# Patient Record
Sex: Female | Born: 1935 | Race: White | Hispanic: No | Smoking: Former smoker
Health system: Southern US, Community
[De-identification: ages and names within clinical notes are randomized; demographics above are authoritative.]

## PROBLEM LIST (undated history)

## (undated) DIAGNOSIS — M21611 Bunion of right foot: Secondary | ICD-10-CM

## (undated) DIAGNOSIS — M170 Bilateral primary osteoarthritis of knee: Secondary | ICD-10-CM

## (undated) DIAGNOSIS — M8580 Other specified disorders of bone density and structure, unspecified site: Secondary | ICD-10-CM

## (undated) DIAGNOSIS — R42 Dizziness and giddiness: Secondary | ICD-10-CM

## (undated) DIAGNOSIS — E785 Hyperlipidemia, unspecified: Secondary | ICD-10-CM

## (undated) DIAGNOSIS — D039 Melanoma in situ, unspecified: Secondary | ICD-10-CM

## (undated) DIAGNOSIS — I2510 Atherosclerotic heart disease of native coronary artery without angina pectoris: Secondary | ICD-10-CM

## (undated) DIAGNOSIS — C44310 Basal cell carcinoma of skin of unspecified parts of face: Secondary | ICD-10-CM

## (undated) DIAGNOSIS — F419 Anxiety disorder, unspecified: Secondary | ICD-10-CM

## (undated) DIAGNOSIS — Z8639 Personal history of other endocrine, nutritional and metabolic disease: Secondary | ICD-10-CM

## (undated) DIAGNOSIS — G629 Polyneuropathy, unspecified: Principal | ICD-10-CM

## (undated) DIAGNOSIS — I10 Essential (primary) hypertension: Secondary | ICD-10-CM

## (undated) DIAGNOSIS — T7840XA Allergy, unspecified, initial encounter: Secondary | ICD-10-CM

## (undated) DIAGNOSIS — M5416 Radiculopathy, lumbar region: Secondary | ICD-10-CM

## (undated) DIAGNOSIS — I872 Venous insufficiency (chronic) (peripheral): Secondary | ICD-10-CM

## (undated) DIAGNOSIS — M21612 Bunion of left foot: Secondary | ICD-10-CM

## (undated) DIAGNOSIS — C44621 Squamous cell carcinoma of skin of unspecified upper limb, including shoulder: Secondary | ICD-10-CM

## (undated) DIAGNOSIS — I5030 Unspecified diastolic (congestive) heart failure: Secondary | ICD-10-CM

## (undated) DIAGNOSIS — N9089 Other specified noninflammatory disorders of vulva and perineum: Secondary | ICD-10-CM

## (undated) DIAGNOSIS — K219 Gastro-esophageal reflux disease without esophagitis: Secondary | ICD-10-CM

## (undated) HISTORY — DX: Unspecified diastolic (congestive) heart failure: I50.30

## (undated) HISTORY — DX: Dizziness and giddiness: R42

## (undated) HISTORY — DX: Radiculopathy, lumbar region: M54.16

## (undated) HISTORY — PX: EYE SURGERY: SHX253

## (undated) HISTORY — DX: Basal cell carcinoma of skin of unspecified parts of face: C44.310

## (undated) HISTORY — DX: Essential (primary) hypertension: I10

## (undated) HISTORY — DX: Anxiety disorder, unspecified: F41.9

## (undated) HISTORY — DX: Bilateral primary osteoarthritis of knee: M17.0

## (undated) HISTORY — DX: Melanoma in situ, unspecified: D03.9

## (undated) HISTORY — DX: Venous insufficiency (chronic) (peripheral): I87.2

## (undated) HISTORY — DX: Hyperlipidemia, unspecified: E78.5

## (undated) HISTORY — DX: Bunion of left foot: M21.612

## (undated) HISTORY — DX: Bunion of right foot: M21.611

## (undated) HISTORY — DX: Personal history of other endocrine, nutritional and metabolic disease: Z86.39

## (undated) HISTORY — DX: Atherosclerotic heart disease of native coronary artery without angina pectoris: I25.10

## (undated) HISTORY — DX: Other specified noninflammatory disorders of vulva and perineum: N90.89

## (undated) HISTORY — DX: Squamous cell carcinoma of skin of unspecified upper limb, including shoulder: C44.621

## (undated) HISTORY — DX: Allergy, unspecified, initial encounter: T78.40XA

## (undated) HISTORY — DX: Polyneuropathy, unspecified: G62.9

## (undated) HISTORY — PX: TUBAL LIGATION: SHX77

## (undated) HISTORY — DX: Other specified disorders of bone density and structure, unspecified site: M85.80

## (undated) HISTORY — DX: Gastro-esophageal reflux disease without esophagitis: K21.9

---

## 1997-06-21 ENCOUNTER — Other Ambulatory Visit: Admission: RE | Admit: 1997-06-21 | Discharge: 1997-06-21 | Payer: Self-pay | Admitting: Family Medicine

## 1997-07-01 ENCOUNTER — Ambulatory Visit (HOSPITAL_COMMUNITY): Admission: RE | Admit: 1997-07-01 | Discharge: 1997-07-01 | Payer: Self-pay | Admitting: Family Medicine

## 1997-12-24 ENCOUNTER — Encounter: Admission: RE | Admit: 1997-12-24 | Discharge: 1997-12-24 | Payer: Self-pay | Admitting: *Deleted

## 1998-05-16 ENCOUNTER — Other Ambulatory Visit: Admission: RE | Admit: 1998-05-16 | Discharge: 1998-05-16 | Payer: Self-pay | Admitting: Family Medicine

## 1998-07-04 ENCOUNTER — Ambulatory Visit (HOSPITAL_COMMUNITY): Admission: RE | Admit: 1998-07-04 | Discharge: 1998-07-04 | Payer: Self-pay | Admitting: Family Medicine

## 1999-05-18 ENCOUNTER — Other Ambulatory Visit: Admission: RE | Admit: 1999-05-18 | Discharge: 1999-05-18 | Payer: Self-pay | Admitting: Family Medicine

## 1999-07-05 ENCOUNTER — Ambulatory Visit (HOSPITAL_COMMUNITY): Admission: RE | Admit: 1999-07-05 | Discharge: 1999-07-05 | Payer: Self-pay | Admitting: Obstetrics & Gynecology

## 2000-05-21 ENCOUNTER — Other Ambulatory Visit: Admission: RE | Admit: 2000-05-21 | Discharge: 2000-05-21 | Payer: Self-pay | Admitting: Family Medicine

## 2000-06-11 ENCOUNTER — Encounter: Admission: RE | Admit: 2000-06-11 | Discharge: 2000-06-11 | Payer: Self-pay | Admitting: Internal Medicine

## 2000-06-11 ENCOUNTER — Encounter: Payer: Self-pay | Admitting: Family Medicine

## 2000-07-08 ENCOUNTER — Ambulatory Visit (HOSPITAL_COMMUNITY): Admission: RE | Admit: 2000-07-08 | Discharge: 2000-07-08 | Payer: Self-pay

## 2001-07-10 ENCOUNTER — Encounter: Payer: Self-pay | Admitting: Family Medicine

## 2001-07-10 ENCOUNTER — Ambulatory Visit (HOSPITAL_COMMUNITY): Admission: RE | Admit: 2001-07-10 | Discharge: 2001-07-10 | Payer: Self-pay | Admitting: Family Medicine

## 2001-07-23 ENCOUNTER — Other Ambulatory Visit: Admission: RE | Admit: 2001-07-23 | Discharge: 2001-07-23 | Payer: Self-pay | Admitting: Family Medicine

## 2002-07-15 ENCOUNTER — Ambulatory Visit (HOSPITAL_COMMUNITY): Admission: RE | Admit: 2002-07-15 | Discharge: 2002-07-15 | Payer: Self-pay | Admitting: *Deleted

## 2002-07-24 ENCOUNTER — Other Ambulatory Visit: Admission: RE | Admit: 2002-07-24 | Discharge: 2002-07-24 | Payer: Self-pay | Admitting: Family Medicine

## 2004-05-15 ENCOUNTER — Ambulatory Visit (HOSPITAL_COMMUNITY): Admission: RE | Admit: 2004-05-15 | Discharge: 2004-05-15 | Payer: Self-pay | Admitting: *Deleted

## 2004-05-29 ENCOUNTER — Ambulatory Visit (HOSPITAL_COMMUNITY): Admission: RE | Admit: 2004-05-29 | Discharge: 2004-05-29 | Payer: Self-pay | Admitting: *Deleted

## 2005-03-30 ENCOUNTER — Other Ambulatory Visit: Admission: RE | Admit: 2005-03-30 | Discharge: 2005-03-30 | Payer: Self-pay | Admitting: Family Medicine

## 2005-05-16 ENCOUNTER — Ambulatory Visit (HOSPITAL_COMMUNITY): Admission: RE | Admit: 2005-05-16 | Discharge: 2005-05-16 | Payer: Self-pay | Admitting: Family Medicine

## 2006-05-17 ENCOUNTER — Ambulatory Visit (HOSPITAL_COMMUNITY): Admission: RE | Admit: 2006-05-17 | Discharge: 2006-05-17 | Payer: Self-pay | Admitting: Family Medicine

## 2006-11-22 ENCOUNTER — Encounter: Admission: RE | Admit: 2006-11-22 | Discharge: 2006-11-22 | Payer: Self-pay | Admitting: Family Medicine

## 2008-08-12 ENCOUNTER — Other Ambulatory Visit: Admission: RE | Admit: 2008-08-12 | Discharge: 2008-08-12 | Payer: Self-pay | Admitting: Family Medicine

## 2009-03-19 DIAGNOSIS — I2510 Atherosclerotic heart disease of native coronary artery without angina pectoris: Secondary | ICD-10-CM

## 2009-03-19 HISTORY — DX: Atherosclerotic heart disease of native coronary artery without angina pectoris: I25.10

## 2009-09-02 ENCOUNTER — Ambulatory Visit: Payer: Self-pay | Admitting: Internal Medicine

## 2009-09-02 ENCOUNTER — Observation Stay (HOSPITAL_COMMUNITY): Admission: EM | Admit: 2009-09-02 | Discharge: 2009-09-03 | Payer: Self-pay | Admitting: Emergency Medicine

## 2009-09-15 ENCOUNTER — Ambulatory Visit: Payer: Self-pay | Admitting: Otolaryngology

## 2009-10-13 ENCOUNTER — Ambulatory Visit: Payer: Self-pay | Admitting: Otolaryngology

## 2009-10-13 LAB — CBC AND DIFFERENTIAL: WBC: 5.3 10^3/mL

## 2009-11-25 ENCOUNTER — Ambulatory Visit: Payer: Self-pay | Admitting: Cardiology

## 2009-12-01 ENCOUNTER — Telehealth (INDEPENDENT_AMBULATORY_CARE_PROVIDER_SITE_OTHER): Payer: Self-pay | Admitting: *Deleted

## 2009-12-05 ENCOUNTER — Encounter: Payer: Self-pay | Admitting: Internal Medicine

## 2009-12-05 ENCOUNTER — Encounter (HOSPITAL_COMMUNITY): Admission: RE | Admit: 2009-12-05 | Discharge: 2010-02-17 | Payer: Self-pay | Admitting: Cardiology

## 2009-12-05 ENCOUNTER — Ambulatory Visit (HOSPITAL_COMMUNITY): Admission: RE | Admit: 2009-12-05 | Discharge: 2009-12-05 | Payer: Self-pay | Admitting: Cardiology

## 2009-12-05 ENCOUNTER — Ambulatory Visit: Payer: Self-pay

## 2009-12-05 ENCOUNTER — Ambulatory Visit: Payer: Self-pay | Admitting: Internal Medicine

## 2009-12-05 HISTORY — PX: TRANSTHORACIC ECHOCARDIOGRAM: SHX275

## 2009-12-14 ENCOUNTER — Ambulatory Visit: Payer: Self-pay | Admitting: Cardiovascular Disease

## 2009-12-19 ENCOUNTER — Ambulatory Visit: Payer: Self-pay | Admitting: Cardiovascular Disease

## 2009-12-19 ENCOUNTER — Encounter: Payer: Self-pay | Admitting: Thoracic Surgery (Cardiothoracic Vascular Surgery)

## 2009-12-19 ENCOUNTER — Inpatient Hospital Stay (HOSPITAL_BASED_OUTPATIENT_CLINIC_OR_DEPARTMENT_OTHER): Admission: RE | Admit: 2009-12-19 | Discharge: 2009-12-19 | Payer: Self-pay | Admitting: Cardiovascular Disease

## 2009-12-19 ENCOUNTER — Inpatient Hospital Stay (HOSPITAL_COMMUNITY): Admission: EM | Admit: 2009-12-19 | Discharge: 2009-12-21 | Payer: Self-pay | Admitting: Cardiovascular Disease

## 2009-12-19 ENCOUNTER — Ambulatory Visit: Payer: Self-pay | Admitting: Thoracic Surgery (Cardiothoracic Vascular Surgery)

## 2009-12-20 HISTORY — PX: CARDIAC CATHETERIZATION: SHX172

## 2010-01-04 ENCOUNTER — Ambulatory Visit: Payer: Self-pay | Admitting: Cardiology

## 2010-02-08 ENCOUNTER — Ambulatory Visit: Payer: Self-pay | Admitting: Cardiology

## 2010-04-06 ENCOUNTER — Encounter: Payer: Self-pay | Admitting: Cardiology

## 2010-04-18 NOTE — Progress Notes (Signed)
Summary: Nuclear Pre-Procedure  Phone Note Outgoing Call   Call placed by: Milana Na, EMT-P,  December 01, 2009 12:38 PM Summary of Call: Reviewed information on [employer] Information Sheet (see scanned document for further details).  Spoke with patient.     Nuclear Med Background Indications for Stress Test: Evaluation for Ischemia  Indications Comments: 06/17/11dizziness  ED   History Comments: Cardiomegaly per CXR  Symptoms: DOE, Light-Headedness, Near Syncope, Palpitations, Rapid HR, SOB    Nuclear Pre-Procedure Cardiac Risk Factors: History of Smoking, Hypertension  Nuclear Med Study Referring MD:  T.Brackbill

## 2010-04-18 NOTE — Assessment & Plan Note (Signed)
Summary: Cardiology Nuclear Testing  Nuclear Med Background Indications for Stress Test: Evaluation for Ischemia  Indications Comments: 09/02/09 ED: Dizziness with HTN    History Comments: Cardiomegaly per CXR  Symptoms: Diaphoresis, Dizziness, DOE, Fatigue with Exertion, Light-Headedness, Near Syncope, Palpitations, Rapid HR, SOB  Symptoms Comments: Indigestion Yesterday.   Nuclear Pre-Procedure Cardiac Risk Factors: History of Smoking, Hypertension, Lipids Caffeine/Decaff Intake: none NPO After: 7:30 AM Lungs: Clear IV 0.9% NS with Angio Cath: 22g     IV Site: R Hand IV Started by: Cathlyn Parsons, RN Chest Size (in) 34     Cup Size B     Height (in): 63 Weight (lb): 140 BMI: 24.89  Nuclear Med Study 1 or 2 day study:  1 day     Stress Test Type:  Stress Reading MD:  Dietrich Pates, MD     Referring MD:  T.Brackbill Resting Radionuclide:  Technetium 81m Tetrofosmin     Resting Radionuclide Dose:  11.0 mCi  Stress Radionuclide:  Technetium 72m Tetrofosmin     Stress Radionuclide Dose:  33.0 mCi   Stress Protocol Exercise Time (min):  4:00 min     Max HR:  136 bpm     Predicted Max HR:  135 bpm  Max Systolic BP: 187 mm Hg     Percent Max HR:  100.74 %     METS: 5.5 Rate Pressure Product:  16109    Stress Test Technologist:  Irean Hong,  RN     Nuclear Technologist:  Doyne Keel, CNMT  Rest Procedure  Myocardial perfusion imaging was performed at rest 45 minutes following the intravenous administration of Technetium 52m Tetrofosmin.  Stress Procedure  The patient exercised for four minutes.  The patient stopped due to DOE   and denied any chest pain.  There were  significant ST-T wave changes, frequent PVC's,rare couplet,begiminy,PJC,PAC.  Technetium 16m Tetrofosmin was injected at peak exercise and myocardial perfusion imaging was performed after a brief delay.  QPS Raw Data Images:  Images were motion corrected.  Soft tissue (diaphragm, breast, bowel activity)  surround heart. Stress Images:  Mild thinning in the anterior wall (mid, distal) and apex.  Otherwise normal perfusion. Rest Images:  Normal homogeneous uptake in all areas of the myocardium. Subtraction (SDS):  Signifcant for ischemia. Transient Ischemic Dilatation:  0.97  (Normal <1.22)  Lung/Heart Ratio:  0.26  (Normal <0.45)  Quantitative Gated Spect Images QGS EDV:  78 ml QGS ESV:  28 ml QGS EF:  64 % QGS cine images:  Normal wall thickening.   Overall Impression  Exercise Capacity: Fair exercise capacity. BP Response: Normal blood pressure response. Clinical Symptoms: No chest pain ECG Impression: 1to 1.5  mm uplsloping ST depression in the inferolateral leads during exercise; less than 1 mm flat ST deprssion in the inferolateral leads in recovery. Overall Impression: Mild ischemia in the anterior and apical wall.  Otherwise normal perfusion.  Cannot completely exclude shifting soft tissue (breast)

## 2010-06-01 LAB — CBC
HCT: 36.3 % (ref 36.0–46.0)
Hemoglobin: 12.3 g/dL (ref 12.0–15.0)
Hemoglobin: 14.4 g/dL (ref 12.0–15.0)
MCHC: 33.9 g/dL (ref 30.0–36.0)
Platelets: 164 10*3/uL (ref 150–400)
RBC: 3.89 MIL/uL (ref 3.87–5.11)
RBC: 4.54 MIL/uL (ref 3.87–5.11)
RDW: 13.4 % (ref 11.5–15.5)
WBC: 6.6 10*3/uL (ref 4.0–10.5)

## 2010-06-01 LAB — BASIC METABOLIC PANEL
BUN: 12 mg/dL (ref 6–23)
CO2: 26 mEq/L (ref 19–32)
CO2: 28 mEq/L (ref 19–32)
Calcium: 8.9 mg/dL (ref 8.4–10.5)
Creatinine, Ser: 0.67 mg/dL (ref 0.4–1.2)
GFR calc non Af Amer: >60 mL/min (ref >60)
Glucose, Bld: 105 mg/dL — ABNORMAL HIGH (ref 70–99)

## 2010-06-01 LAB — APTT: aPTT: 24 seconds (ref 24–37)

## 2010-06-01 LAB — CARDIAC PANEL(CRET KIN+CKTOT+MB+TROPI)
CK, MB: 1.7 ng/mL (ref 0.3–4.0)
CK, MB: 1.8 ng/mL (ref 0.3–4.0)
Total CK: 65 U/L (ref 7–177)
Total CK: 67 U/L (ref 7–177)
Troponin I: 0.02 ng/mL (ref 0.00–0.06)

## 2010-06-01 LAB — PROTIME-INR
INR: 0.92 (ref 0.00–1.49)
Prothrombin Time: 12.6 seconds (ref 11.6–15.2)

## 2010-06-04 LAB — CARDIAC PANEL(CRET KIN+CKTOT+MB+TROPI)
CK, MB: 2.3 ng/mL (ref 0.3–4.0)
Relative Index: INVALID (ref 0.0–2.5)
Total CK: 69 U/L (ref 7–177)
Troponin I: 0.02 ng/mL (ref 0.00–0.06)

## 2010-06-04 LAB — BASIC METABOLIC PANEL
BUN: 15 mg/dL (ref 6–23)
CO2: 25 mEq/L (ref 19–32)
Calcium: 9.3 mg/dL (ref 8.4–10.5)
Chloride: 99 mEq/L (ref 96–112)
Creatinine, Ser: 0.8 mg/dL (ref 0.4–1.2)
GFR calc Af Amer: >60 mL/min (ref >60)
GFR calc non Af Amer: >60 mL/min (ref >60)
Glucose, Bld: 120 mg/dL — ABNORMAL HIGH (ref 70–99)
Potassium: 4.1 mEq/L (ref 3.5–5.1)
Sodium: 134 mEq/L — ABNORMAL LOW (ref 135–145)

## 2010-06-04 LAB — CBC
HCT: 44 % (ref 36.0–46.0)
Hemoglobin: 15.1 g/dL — ABNORMAL HIGH (ref 12.0–15.0)
MCHC: 34.4 g/dL (ref 30.0–36.0)
MCV: 94.6 fL (ref 78.0–100.0)
Platelets: 161 10*3/uL (ref 150–400)
RBC: 4.65 MIL/uL (ref 3.87–5.11)
RDW: 14.2 % (ref 11.5–15.5)
WBC: 7.2 10*3/uL (ref 4.0–10.5)

## 2010-06-04 LAB — URINALYSIS, ROUTINE W REFLEX MICROSCOPIC
Bilirubin Urine: NEGATIVE
Glucose, UA: NEGATIVE mg/dL
Hgb urine dipstick: NEGATIVE
Ketones, ur: NEGATIVE mg/dL
Nitrite: NEGATIVE
Protein, ur: NEGATIVE mg/dL
Specific Gravity, Urine: 1.013 (ref 1.005–1.030)
Urobilinogen, UA: 0.2 mg/dL (ref 0.0–1.0)
pH: 7.5 (ref 5.0–8.0)

## 2010-06-04 LAB — TROPONIN I: Troponin I: 0.02 ng/mL (ref 0.00–0.06)

## 2010-06-04 LAB — LIPID PANEL
HDL: 72 mg/dL (ref >39)
LDL Cholesterol: 132 mg/dL — ABNORMAL HIGH (ref 0–99)
Total CHOL/HDL Ratio: 3 RATIO
Triglycerides: 59 mg/dL (ref <150)

## 2010-06-04 LAB — CK TOTAL AND CKMB (NOT AT ARMC)
CK, MB: 2.9 ng/mL (ref 0.3–4.0)
Relative Index: INVALID (ref 0.0–2.5)
Total CK: 81 U/L (ref 7–177)

## 2010-06-04 LAB — APTT: aPTT: 34 seconds (ref 24–37)

## 2010-06-04 LAB — DIFFERENTIAL
Basophils Absolute: 0 10*3/uL (ref 0.0–0.1)
Basophils Relative: 0 % (ref 0–1)
Eosinophils Absolute: 0 10*3/uL (ref 0.0–0.7)
Eosinophils Relative: 0 % (ref 0–5)
Lymphocytes Relative: 13 % (ref 12–46)
Lymphs Abs: 1 10*3/uL (ref 0.7–4.0)
Monocytes Absolute: 0.5 10*3/uL (ref 0.1–1.0)
Monocytes Relative: 7 % (ref 3–12)
Neutro Abs: 5.6 10*3/uL (ref 1.7–7.7)
Neutrophils Relative %: 79 % — ABNORMAL HIGH (ref 43–77)

## 2010-06-04 LAB — PROTIME-INR
INR: 0.97 (ref 0.00–1.49)
Prothrombin Time: 12.8 seconds (ref 11.6–15.2)

## 2010-06-06 ENCOUNTER — Encounter: Payer: Self-pay | Admitting: Cardiology

## 2010-06-06 ENCOUNTER — Other Ambulatory Visit: Payer: Self-pay | Admitting: Cardiology

## 2010-06-06 ENCOUNTER — Ambulatory Visit (INDEPENDENT_AMBULATORY_CARE_PROVIDER_SITE_OTHER): Payer: Medicare Other | Admitting: Cardiology

## 2010-06-06 VITALS — BP 150/80 | HR 68 | Wt 139.0 lb

## 2010-06-06 DIAGNOSIS — I491 Atrial premature depolarization: Secondary | ICD-10-CM

## 2010-06-06 DIAGNOSIS — I259 Chronic ischemic heart disease, unspecified: Secondary | ICD-10-CM

## 2010-06-06 DIAGNOSIS — Z79899 Other long term (current) drug therapy: Secondary | ICD-10-CM

## 2010-06-06 DIAGNOSIS — E7800 Pure hypercholesterolemia, unspecified: Secondary | ICD-10-CM

## 2010-06-06 LAB — COMPREHENSIVE METABOLIC PANEL
ALT: 14 U/L (ref 0–35)
AST: 13 U/L (ref 0–37)
Albumin: 4.8 g/dL (ref 3.5–5.2)
CO2: 24 mEq/L (ref 19–32)
Calcium: 9.7 mg/dL (ref 8.4–10.5)
Chloride: 98 mEq/L (ref 96–112)
Creat: 0.78 mg/dL (ref 0.40–1.20)
Potassium: 4.3 mEq/L (ref 3.5–5.3)
Sodium: 136 mEq/L (ref 135–145)
Total Protein: 7.5 g/dL (ref 6.0–8.3)

## 2010-06-06 LAB — LIPID PANEL: Total CHOL/HDL Ratio: 2.8 Ratio

## 2010-06-06 NOTE — Progress Notes (Addendum)
Subjective: This 77 year old Caucasian female is seen for a scheduled 4 month followup office visit and EKG.  She has known coronary artery disease.  She had 3 bare metal stents inserted on 12/20/09 into her mid left circumflex coronary artery second obtuse marginal vessel, into the first obtuse marginal vessel, and into the mid right coronary artery.  She's had no subsequent angina pectoris and she is now experiencing any exertional dyspnea.  She has finished her 12 week cardiac rehabilitation program and is now participating in a "fit and strong" exercise program at the [place]  recreation center 3 days a week.She is not having any side effects from her long-term statin therapy.  She does have a past history of whitecoat syndrome and her blood pressures at home are normal in the range of the 130/60.  Current Outpatient Prescriptions  Medication Sig Dispense Refill  . aspirin 325 MG tablet Take 325 mg by mouth daily.        . calcium carbonate (OS-CAL) 600 MG TABS Take 600 mg by mouth daily. Takes occ      . co-enzyme Q-10 30 MG capsule Take 30 mg by mouth daily.       . enalapril (VASOTEC) 20 MG tablet Take 20 mg by mouth daily.       . metoprolol (TOPROL-XL) 25 MG 24 hr tablet Take 25 mg by mouth daily. Takes 1/2 tab daily      . Multiple Vitamin (MULTIVITAMIN) tablet Take 1 tablet by mouth daily. Takes occ      . Omega-3 Fatty Acids (FISH OIL PO) Take by mouth daily.        . simvastatin (ZOCOR) 20 MG tablet Take 20 mg by mouth daily.       . Triamterene-HCTZ (MAXZIDE PO) Take by mouth as needed.        . ALPRAZolam (XANAX) 0.25 MG tablet Take 0.25 mg by mouth at bedtime as needed.       . clopidogrel (PLAVIX) 75 MG tablet Take 75 mg by mouth daily.       . famotidine (PEPCID) 20 MG tablet Take 20 mg by mouth daily.       Marland Kitchen guaiFENesin (MUCINEX) 600 MG 12 hr tablet Take 600 mg by mouth 2 (two) times daily. As needed      . DISCONTD: aspirin 81 MG tablet Take 325 mg by mouth daily.          Allergies  Allergen Reactions  . Atenolol   . Gabapentin     Patient Active Problem List  Diagnoses  . Hypertension  . Anxiety  . Vertigo    History  Smoking status  . Former Smoker  . Quit date: 09/04/1977  Smokeless tobacco  . Not on file    History  Alcohol Use     Family History  Problem Relation Age of Onset  . Heart disease Mother   . Stroke Father     Review of Systems: Constitutional: no fever chills diaphoresis or fatigue or change in weight.  Head and neck: no hearing loss, no epistaxis, no photophobia or visual disturbance. Respiratory: No cough, shortness of breath or wheezing. Cardiovascular: No chest pain peripheral edema, palpitations. Gastrointestinal: No abdominal distention, no abdominal pain, no change in bowel habits hematochezia or melena. Genitourinary: No dysuria, no frequency, no urgency, no nocturia. Musculoskeletal:No arthralgias, no back pain, no gait disturbance or myalgias. Neurological: No dizziness, no headaches, no numbness, no seizures, no syncope, no weakness, no tremors. Hematologic: No lymphadenopathy,  no easy bruising. Psychiatric: No confusion, no hallucinations, no sleep disturbance.  Physical Exam: Blood pressure is 150/80 bilaterally sitting.  Weight is 139.  Pulse is 68 and regular.  This is an alert 77 year old woman in no acute distress.Pupils equal and reactive.   Extraocular Movements are full.  There is no scleral icterus.  The mouth and pharynx are normal.  The neck is supple.  The carotids reveal no bruits.  The jugular venous pressure is normal.  The thyroid is not enlarged.  There is no lymphadenopathy.The chest is clear to percussion and auscultation. There are no rales or rhonchi. Expansion of the chest is symmetrical.The precordium is quiet.  The first heart sound is normal.  The second heart sound is physiologically split.  There is no murmur gallop rub or click.  There is no abnormal lift or heave.The abdomen is  soft and nontender. Bowel sounds are normal. The liver and spleen are not enlarged. There Are no abdominal masses. There are no bruits.The pedal pulses are good.  There is no phlebitis or edema.  There is no cyanosis or clubbing.Strength is normal and symmetrical in all extremities.  There is no lateralizing weakness.  There are no sensory deficits.  Assessment / Plan Stable ischemic heart disease post drug-eluting stents x3 The patient will remain on her same medication and be rechecked in 6 months for followup office visit

## 2010-06-19 ENCOUNTER — Telehealth: Payer: Self-pay | Admitting: *Deleted

## 2010-06-19 NOTE — Telephone Encounter (Signed)
Message copied by Regis Bill on Mon Jun 19, 2010  4:30 PM ------      Message from: Cassell Clement      Created: Wed Jun 07, 2010  8:57 AM       Please report to patient.  Her comprehensive metabolic panel is normal.  Her liver function studies are normal and your kidney function is normal.  Her lipids have improved since last time.Please send copies of labs to Silas [place] NP

## 2010-06-19 NOTE — Telephone Encounter (Signed)
Advised patient of labs 

## 2010-06-21 ENCOUNTER — Encounter: Payer: Self-pay | Admitting: *Deleted

## 2010-07-31 ENCOUNTER — Telehealth: Payer: Self-pay | Admitting: Cardiology

## 2010-07-31 NOTE — Telephone Encounter (Signed)
Agree with advice given

## 2010-07-31 NOTE — Telephone Encounter (Signed)
OVER NIGHT HAD SEVERE INDIGUESTION, BURPERING, PAIN IN RIGHT CHEST. CAN'T EAT DUE, BURPING AND WEAK STOMACH. WHAT SHOULD SHE DO?  HAS NOT TAKING ANYTHING ELSE OTHER THAN BP MED.

## 2010-07-31 NOTE — Telephone Encounter (Signed)
Patient went to her primary care doctor and they couldn't see her today.  Pain is right sided, denies any shortness of breath, or radiating anywhere.  Has not taken any pepcid or anything like it.  Does have appointment with primary doctor tomorrow.  Advised to go to the emergency room if pain starts to radiate or shortness of breath.  Verbalized understanding.

## 2010-09-26 ENCOUNTER — Other Ambulatory Visit: Payer: Self-pay | Admitting: Cardiology

## 2010-09-26 DIAGNOSIS — F419 Anxiety disorder, unspecified: Secondary | ICD-10-CM

## 2010-09-26 DIAGNOSIS — I10 Essential (primary) hypertension: Secondary | ICD-10-CM

## 2010-09-26 NOTE — Telephone Encounter (Signed)
Called in to report her episodes of high blood pressure and she called 911 this morning and the EMT's recorded her blood pressure at 190/100 and they suggested that she call her cardiologist and suggested that her medications be increased. Also needs a refill for Xanax and needs to schedule an appointment from her recall. Please call back. I have pulled her chart.

## 2010-09-26 NOTE — Telephone Encounter (Signed)
Would continue present dose of blood pressure meds and keep a written record checking it morning and evening and let us know in about a week how she is doing by phone

## 2010-09-26 NOTE — Telephone Encounter (Signed)
Advised patient

## 2010-09-26 NOTE — Telephone Encounter (Signed)
blood pressure on the 4th went way up and was real dizzy.  This am 197/98 heart rate 91.  Since surgery not handling blood pressure very well.  Has not used xanax and they suggested she use that.  She did take the xanax.  This blood pressure reading was before her morning medications.  When rechecked blood pressure 138/79 around 9:30  Am.  The elevated blood pressure was around 6 am.

## 2010-09-26 NOTE — Telephone Encounter (Signed)
Patient takes vasotec 20 mg daily and toprol 25 mg 1/2 daily.  Doesn't check blood pressure on regular basis, however average 120's-130's/80's.  Please advise

## 2010-09-26 NOTE — Telephone Encounter (Signed)
Ok to refill xanax 0.25 mg #30, you rx'd in October.  Please advise

## 2010-09-27 NOTE — Telephone Encounter (Signed)
Patient phoned again today c/o elevated blood pressure.  Took am medications around 5:00 am and at 9:00 am blood pressure 154/85, 1:00 pm 179/100 and now 196/91 and pulse 81.  Discussed with  Dr. Patty Sermons and will increased vasotec 20 mg to twice daily and metoprolol to 1 full tablet daily, has been on vasotec 20 mg daily and metoprolol 25 mg 1/2 daily.  Take the other metoprolol 1/2 tablet now.  Continue to monitor blood pressure and let us know how it is doing.  Ok to use xanax as needed.

## 2010-09-27 NOTE — Telephone Encounter (Signed)
Okay to refill Xanax? 

## 2010-09-28 ENCOUNTER — Encounter: Payer: Self-pay | Admitting: Cardiology

## 2010-09-28 MED ORDER — ALPRAZOLAM 0.25 MG PO TABS: 0.2500 mg | ORAL_TABLET | Freq: Every evening | ORAL | Status: DC | PRN

## 2010-09-28 MED ORDER — METOPROLOL SUCCINATE ER 25 MG PO TB24: ORAL_TABLET | ORAL | Status: DC

## 2010-09-28 MED ORDER — ENALAPRIL MALEATE 20 MG PO TABS: 20.0000 mg | ORAL_TABLET | Freq: Two times a day (BID) | ORAL | Status: DC

## 2010-10-31 ENCOUNTER — Encounter (HOSPITAL_BASED_OUTPATIENT_CLINIC_OR_DEPARTMENT_OTHER): Payer: Self-pay | Admitting: *Deleted

## 2010-10-31 ENCOUNTER — Emergency Department (INDEPENDENT_AMBULATORY_CARE_PROVIDER_SITE_OTHER): Payer: Medicare Other

## 2010-10-31 ENCOUNTER — Emergency Department (HOSPITAL_BASED_OUTPATIENT_CLINIC_OR_DEPARTMENT_OTHER)
Admission: EM | Admit: 2010-10-31 | Discharge: 2010-10-31 | Disposition: A | Discharge status: Home / Self Care | Payer: Medicare Other | Attending: Emergency Medicine | Admitting: Emergency Medicine

## 2010-10-31 DIAGNOSIS — M79606 Pain in leg, unspecified: Secondary | ICD-10-CM

## 2010-10-31 DIAGNOSIS — I10 Essential (primary) hypertension: Secondary | ICD-10-CM | POA: Insufficient documentation

## 2010-10-31 DIAGNOSIS — M79609 Pain in unspecified limb: Secondary | ICD-10-CM | POA: Insufficient documentation

## 2010-10-31 DIAGNOSIS — I2510 Atherosclerotic heart disease of native coronary artery without angina pectoris: Secondary | ICD-10-CM | POA: Insufficient documentation

## 2010-10-31 DIAGNOSIS — E785 Hyperlipidemia, unspecified: Secondary | ICD-10-CM | POA: Insufficient documentation

## 2010-10-31 MED ORDER — IBUPROFEN 800 MG PO TABS
800.0000 mg | ORAL_TABLET | Freq: Once | ORAL | Status: AC
  Administered 2010-10-31: 800 mg via ORAL
  Filled 2010-10-31: qty 1

## 2010-10-31 NOTE — ED Notes (Signed)
Pt c/o left knee pain with injury

## 2010-10-31 NOTE — ED Provider Notes (Addendum)
History     CSN: [id number] Arrival date & time: 10/31/2010  3:07 PM  Chief Complaint  Patient presents with  . Leg Pain   Patient is a 77 y.o. female presenting with leg pain. The history is provided by the patient.  Leg Pain  The incident occurred 12 to 24 hours ago. There was no injury mechanism. The pain is present in the left leg. The pain is moderate.   This 77 year old female presents today with pain in left lower extremity. She states she noticed it after she was sitting at her computer class. Patient denies any known injury. She denies any fever or systemic symptoms such as nausea numbness tingling weakness back pain or dysuria. She is on a statin and was concerned for "muscle breakdown". Patient denies any previous history of clots. Past Medical History  Diagnosis Date  . Hypertension   . Anxiety   . Vertigo   . Coronary artery disease   . Hyperlipidemia     Past Surgical History  Procedure Date  . Cardiac catheterization 12/20/2009    3 bare metal stents    Family History  Problem Relation Age of Onset  . Heart disease Mother   . Stroke Father     History  Substance Use Topics  . Smoking status: Former Smoker    Quit date: 09/04/1977  . Smokeless tobacco: Not on file  . Alcohol Use: No    OB History    Grav Para Term Preterm Abortions TAB SAB Ect Mult Living                  Review of Systems  Cardiovascular: Negative for chest pain and leg swelling.  Genitourinary: Negative for difficulty urinating.  Hematological: Negative for adenopathy.  All other systems reviewed and are negative.    Physical Exam  BP 173/71  Pulse 73  Temp 98.2 F (36.8 C)  Resp 16  Wt 138 lb (62.596 kg)  Physical Exam  Constitutional: She is oriented to person, place, and time. She appears well-developed and well-nourished.  HENT:  Head: Normocephalic and atraumatic.  Eyes: Conjunctivae and EOM are normal. Pupils are equal, round, and reactive to light.  Neck:  Neck supple.  Cardiovascular: Normal rate and regular rhythm.  Exam reveals no gallop and no friction rub.   No murmur heard. Pulmonary/Chest: Breath sounds normal. She has no wheezes. She has no rales. She exhibits no tenderness.  Abdominal: Soft. Bowel sounds are normal. She exhibits no distension. There is no tenderness. There is no rebound and no guarding.  Musculoskeletal: Normal range of motion. She exhibits tenderness. She exhibits no edema.       Mild diffuse tenderness to left lower extremity. There is no redness no edema no ecchymosis. 2+ peripheral pulses equal and symmetric bilaterally. No deformity  Neurological: She is alert and oriented to person, place, and time. No cranial nerve deficit.  Skin: Skin is warm and dry. No rash noted. No erythema. No pallor.  Psychiatric: She has a normal mood and affect.    ED Course  Procedures  MDM Pt is seen and examined;  Initial history and physical completed.  Will follow.    This 77 year old female is seen for left lower extremity pain without any systemic symptoms. Duplex ultrasound ordered to rule out DVT total CK ordered to rule out rhabdomyolysis patient is given ibuprofen for pain vital signs are normal. She's afebrile. Likely musculoskeletal cause     Daxon Kyne A. Patrica Duel, MD 10/31/10 1520  this patient presents  Theron Arista A. Patrica Duel, MD 10/31/10 1532  Theron Arista A. Jmichael Gille, MD 10/31/10 1535  Patient's duplex ultrasound was negative for any DVT total CK was normal. Would consider musculoskeletal etiology patient was told to take ibuprofen and follow with her doctor this week she is instructed to return to the ED for any concerns or changing symptoms. Discharged home in stable and improved condition  Taraneh Metheney A. Patrica Duel, MD 10/31/10 1650

## 2010-10-31 NOTE — ED Notes (Signed)
Patient states that she was working outside and her left leg began to hurt.  The more she put weight on it, the more it hurt.  States that she had not injured it nor twisted it.  The pain is from her knee down on her left leg.

## 2010-10-31 NOTE — ED Notes (Signed)
153/71 bp on discharge

## 2010-11-06 ENCOUNTER — Ambulatory Visit (INDEPENDENT_AMBULATORY_CARE_PROVIDER_SITE_OTHER): Payer: Medicare Other | Admitting: Family Medicine

## 2010-11-06 ENCOUNTER — Encounter: Payer: Self-pay | Admitting: Family Medicine

## 2010-11-06 DIAGNOSIS — C44310 Basal cell carcinoma of skin of unspecified parts of face: Secondary | ICD-10-CM

## 2010-11-06 DIAGNOSIS — I10 Essential (primary) hypertension: Secondary | ICD-10-CM

## 2010-11-06 DIAGNOSIS — G609 Hereditary and idiopathic neuropathy, unspecified: Secondary | ICD-10-CM

## 2010-11-06 DIAGNOSIS — I2510 Atherosclerotic heart disease of native coronary artery without angina pectoris: Secondary | ICD-10-CM

## 2010-11-06 DIAGNOSIS — M79609 Pain in unspecified limb: Secondary | ICD-10-CM

## 2010-11-06 DIAGNOSIS — R42 Dizziness and giddiness: Secondary | ICD-10-CM

## 2010-11-06 DIAGNOSIS — F411 Generalized anxiety disorder: Secondary | ICD-10-CM

## 2010-11-06 DIAGNOSIS — M79606 Pain in leg, unspecified: Secondary | ICD-10-CM | POA: Insufficient documentation

## 2010-11-06 DIAGNOSIS — M21619 Bunion of unspecified foot: Secondary | ICD-10-CM

## 2010-11-06 DIAGNOSIS — M79605 Pain in left leg: Secondary | ICD-10-CM

## 2010-11-06 DIAGNOSIS — Z79899 Other long term (current) drug therapy: Secondary | ICD-10-CM

## 2010-11-06 DIAGNOSIS — C44621 Squamous cell carcinoma of skin of unspecified upper limb, including shoulder: Secondary | ICD-10-CM

## 2010-11-06 DIAGNOSIS — G629 Polyneuropathy, unspecified: Secondary | ICD-10-CM

## 2010-11-06 DIAGNOSIS — E7800 Pure hypercholesterolemia, unspecified: Secondary | ICD-10-CM

## 2010-11-06 DIAGNOSIS — E785 Hyperlipidemia, unspecified: Secondary | ICD-10-CM

## 2010-11-06 DIAGNOSIS — I259 Chronic ischemic heart disease, unspecified: Secondary | ICD-10-CM

## 2010-11-06 DIAGNOSIS — M21611 Bunion of right foot: Secondary | ICD-10-CM

## 2010-11-06 DIAGNOSIS — K5900 Constipation, unspecified: Secondary | ICD-10-CM

## 2010-11-06 DIAGNOSIS — F419 Anxiety disorder, unspecified: Secondary | ICD-10-CM

## 2010-11-06 HISTORY — DX: Polyneuropathy, unspecified: G62.9

## 2010-11-06 HISTORY — DX: Squamous cell carcinoma of skin of unspecified upper limb, including shoulder: C44.621

## 2010-11-06 HISTORY — DX: Bunion of right foot: M21.611

## 2010-11-06 HISTORY — DX: Basal cell carcinoma of skin of unspecified parts of face: C44.310

## 2010-11-06 NOTE — Assessment & Plan Note (Signed)
Describes more of a light headed sensation, encouraged good hydration and well balanced, nutritious,small, frequent meals and encouraged not to arise too quickly

## 2010-11-06 NOTE — Assessment & Plan Note (Signed)
She reports a long history of white coat syndrome and her numbers did improve on recheck. Will not change meds today but will request old records to further evaluate

## 2010-11-06 NOTE — Assessment & Plan Note (Signed)
Tolerated them if she wears the right shoes, she may try icing and either Aspercreme or Capsaicin creme topically if pain flares

## 2010-11-06 NOTE — Assessment & Plan Note (Signed)
Needs repeat FLP, tolerating Zocor although patient is questioning if her left shoulder pain and more recently left calf pain could be related to Zocor use. She is encouraged to continue Zocor for now and we will revisit at next visit.

## 2010-11-06 NOTE — Patient Instructions (Signed)

## 2010-11-06 NOTE — Assessment & Plan Note (Signed)
Mild and she generally manages it with exercise, good hydration and fiber, does occasionally have to take some magnesium if no BM in a couple of days.

## 2010-11-06 NOTE — Assessment & Plan Note (Signed)
Went to ER last week with severe left calf pain, [place] was neg for DVT and CK was negative. This episode occurred a day after she started an intensive exercise program [place]. Likely she had a muscle cramp. Pain resolved over several days and has not recurred. Encouraged good hydration and we will check labs

## 2010-11-06 NOTE — Assessment & Plan Note (Signed)
Follows with dermatology but cannot recall the name of the practice, she is encouraged to continue to follow up with them and let us know th name of the practice

## 2010-11-06 NOTE — Progress Notes (Signed)
Jennifer Peck [id number] 1922-05-07 11/06/2010      Progress Note New Patient  Subjective  Chief Complaint  Chief Complaint  Patient presents with  . Establish Care    new patient    HPI  Patient is a 77 yo Caucasian female in today to establish care. She has a complicated medical history which includes, HTN, CAD s/p 3 stents last year, hyperlipididemia, anxiety d/o. She is under a great deal of stress lately due to one of her daughters is actively being treated for colon cancer presently. Last week [facility] [employer] and then on Tuesday she had a very sudden, severe pain in her left calf and she went to the ER due to the level of pain. In ER they performed a venous doppler and CK and all was negative and her pain resolved over the next couple of days. No pain today, never any falls or trauma. No recent febrile illness, CP, palp, SOB, GI or GU c/o. Last colonoscopy she thinks was in the past 5 years but we will request old records, after her daughter was dx with colon CA her doctor told her the whole family should be screened every 5 years  Past Medical History  Diagnosis Date  . Hypertension   . Anxiety   . Vertigo   . Coronary artery disease   . Hyperlipidemia   . BCC (basal cell carcinoma), face 11/06/2010  . SCC (squamous cell carcinoma), arm 11/06/2010  . Bilateral bunions 11/06/2010  . Peripheral neuropathy 11/06/2010  . Constipation 11/06/2010    Past Surgical History  Procedure Date  . Cardiac catheterization 12/20/2009    3 bare metal stents  . Tubal ligation     Family History  Problem Relation Age of Onset  . Stroke Mother   . Heart disease Father   . Hypertension Daughter   . Hyperlipidemia Daughter   . Hypertension Son   . Cancer Daughter     rectal/ chemo and radiation  . Hypertension Daughter   . Hypertension Son   . Hyperlipidemia Son   . Hypertension Son   . Vision loss Maternal Grandfather   . Stroke  Paternal Grandmother   . Asthma Paternal Grandfather     History   Social History  . Marital Status: Single    Spouse Name: N/A    Number of Children: N/A  . Years of Education: N/A   Occupational History  . Not on file.   Social History Main Topics  . Smoking status: Former Smoker    Quit date: 09/04/1977  . Smokeless tobacco: Never Used  . Alcohol Use: Yes     only on Holidays  . Drug Use: No  . Sexually Active: Not on file   Other Topics Concern  . Not on file   Social History Narrative  . No narrative on file    Current Outpatient Prescriptions on File Prior to Visit  Medication Sig Dispense Refill  . ALPRAZolam (XANAX) 0.25 MG tablet Take 0.25 mg by mouth daily as needed. Panic attack       . aspirin 325 MG tablet Take 325 mg by mouth daily.        . calcium carbonate (OS-CAL) 600 MG TABS Take 600 mg by mouth daily. Takes occ      . cetirizine (ZYRTEC) 10 MG tablet Take 10 mg by mouth daily as needed. Seasonal allergies       . co-enzyme Q-10  30 MG capsule Take 30 mg by mouth daily.       . enalapril (VASOTEC) 20 MG tablet Take 20 mg by mouth 2 (two) times daily.        . famotidine (PEPCID) 20 MG tablet Take 20 mg by mouth daily.       Marland Kitchen Ketotifen Fumarate (ALLERGY EYE DROPS OP) Place 2 drops into both eyes as needed. Red itchy eyes       . magnesium oxide (MAG-OX) 400 MG tablet Take 400 mg by mouth daily.        . metoprolol succinate (TOPROL-XL) 25 MG 24 hr tablet Take 25 mg by mouth daily. Takes 1 tab daily       . Multiple Vitamin (MULTIVITAMIN) tablet Take 1 tablet by mouth daily. Takes occ      . Omega-3 Fatty Acids (FISH OIL PO) Take 1 capsule by mouth daily.       . simvastatin (ZOCOR) 20 MG tablet Take 20 mg by mouth daily.       . Sodium Chloride-Sodium Bicarb (RA SINUS WASH NETI POT NA) Place into the nose as needed. sinuses         Allergies  Allergen Reactions  . Atenolol     Caused fatigue   . Codeine Nausea And Vomiting  . Gabapentin      Memory loss and confusion    Review of Systems  Review of Systems  Constitutional: Negative for fever, chills and malaise/fatigue.  HENT: Negative for hearing loss, nosebleeds and congestion.   Eyes: Negative for discharge.  Respiratory: Negative for cough, sputum production, shortness of breath and wheezing.   Cardiovascular: Negative for chest pain, palpitations and leg swelling.  Gastrointestinal: Negative for heartburn, nausea, vomiting, abdominal pain, diarrhea, constipation and blood in stool.  Genitourinary: Negative for dysuria, urgency, frequency and hematuria.  Musculoskeletal: Positive for myalgias. Negative for back pain and falls.       Left calf cramp, no redness, warmth trauma  Skin: Negative for rash.  Neurological: Positive for tingling. Negative for dizziness, tremors, sensory change, focal weakness, loss of consciousness, weakness and headaches.       Was diagnosed with peripheral neuropathy in 2011, of note she says the tingling and numbness resolve when she lies down but occur whenever she is up. She has seen a neurology practice but she cannot remember the name. Tingling can go as hi as her knees and is worse on left  Endo/Heme/Allergies: Negative for polydipsia. Does not bruise/bleed easily.  Psychiatric/Behavioral: Negative for depression and suicidal ideas. The patient is not nervous/anxious and does not have insomnia.     Objective  BP 152/88  Pulse 75  Temp(Src) 98.1 F (36.7 C) (Oral)  Ht 5' 1.25" (1.556 m)  Wt 135 lb 1.9 oz (61.29 kg)  BMI 25.32 kg/m2  SpO2 96%  Physical Exam  Physical Exam  Constitutional: She is oriented to person, place, and time and well-developed, well-nourished, and in no distress. No distress.  HENT:  Head: Normocephalic and atraumatic.  Right Ear: External ear normal.  Left Ear: External ear normal.  Nose: Nose normal.  Mouth/Throat: Oropharynx is clear and moist. No oropharyngeal exudate.  Eyes: Conjunctivae are normal.  Pupils are equal, round, and reactive to light. Right eye exhibits no discharge. Left eye exhibits no discharge. No scleral icterus.  Neck: Normal range of motion. Neck supple. No thyromegaly present.  Cardiovascular: Normal rate, regular rhythm, normal heart sounds and intact distal pulses.   No murmur  heard. Pulmonary/Chest: Effort normal and breath sounds normal. No respiratory distress. She has no wheezes. She has no rales.  Abdominal: Soft. Bowel sounds are normal. She exhibits no distension and no mass. There is no tenderness.  Musculoskeletal: Normal range of motion. She exhibits no edema and no tenderness.  Lymphadenopathy:    She has no cervical adenopathy.  Neurological: She is alert and oriented to person, place, and time. She has normal reflexes. No cranial nerve deficit. Gait normal. Coordination normal.  Skin: Skin is warm and dry. No rash noted. She is not diaphoretic.  Psychiatric: Mood, memory and affect normal.       Assessment & Plan  Anxiety Patient reports she has had infrequent trouble with this and has some Xanax from her previous PMd that she still does not need, she will call if this changes, she is dealing with the stress of one of her daughters having colon cancer and actively undergoing treatment at present  [facility] (basal cell carcinoma), face Follows with dermatology but cannot recall the name of the practice, she is encouraged to continue to follow up with them and let us know th name of the practice  Bilateral bunions Tolerated them if she wears the right shoes, she may try icing and either Aspercreme or Capsaicin creme topically if pain flares  Constipation Mild and she generally manages it with exercise, good hydration and fiber, does occasionally have to take some magnesium if no BM in a couple of days.  Hypertension She reports a long history of white coat syndrome and her numbers did improve on recheck. Will not change meds today but will request old  records to further evaluate  Vertigo Describes more of a light headed sensation, encouraged good hydration and well balanced, nutritious,small, frequent meals and encouraged not to arise too quickly  Pure hypercholesterolemia Needs repeat FLP, tolerating Zocor although patient is questioning if her left shoulder pain and more recently left calf pain could be related to Zocor use. She is encouraged to continue Zocor for now and we will revisit at next visit.  Left leg pain Went to ER last week with severe left calf pain, [place] was neg for DVT and CK was negative. This episode occurred a day after she started an intensive exercise program [facility]. Likely she had a muscle cramp. Pain resolved over several days and has not recurred. Encouraged good hydration and we will check labs  Ischemic heart disease Patient reports some cardiomegaly as well, will continue to monitor and continue current meds for now

## 2010-11-06 NOTE — Assessment & Plan Note (Signed)
Patient reports she has had infrequent trouble with this and has some Xanax from her previous PMd that she still does not need, she will call if this changes, she is dealing with the stress of one of her daughters having colon cancer and actively undergoing treatment at present

## 2010-11-06 NOTE — Assessment & Plan Note (Signed)
Patient reports some cardiomegaly as well, will continue to monitor and continue current meds for now

## 2010-11-09 ENCOUNTER — Telehealth: Payer: Self-pay | Admitting: Cardiology

## 2010-11-09 NOTE — Telephone Encounter (Signed)
Advised ok for primary care physician to Rx her cholesterol medications since she has labs scheduled with her tomorrow.  Her leg gave away and they think it was secondary to Simvastatin

## 2010-11-09 NOTE — Telephone Encounter (Signed)
Left leg collapsed and she went to [facility] ER HP unit.  Her Cholesterol needs to be changed.   Does Dr. Patty Sermons need to do this or the Primary Care Doc?  Please call patient.

## 2010-11-10 ENCOUNTER — Telehealth: Payer: Self-pay

## 2010-11-10 ENCOUNTER — Other Ambulatory Visit (INDEPENDENT_AMBULATORY_CARE_PROVIDER_SITE_OTHER): Payer: Medicare Other

## 2010-11-10 DIAGNOSIS — E785 Hyperlipidemia, unspecified: Secondary | ICD-10-CM

## 2010-11-10 DIAGNOSIS — Z79899 Other long term (current) drug therapy: Secondary | ICD-10-CM

## 2010-11-10 DIAGNOSIS — G609 Hereditary and idiopathic neuropathy, unspecified: Secondary | ICD-10-CM

## 2010-11-10 DIAGNOSIS — I10 Essential (primary) hypertension: Secondary | ICD-10-CM

## 2010-11-10 DIAGNOSIS — I2510 Atherosclerotic heart disease of native coronary artery without angina pectoris: Secondary | ICD-10-CM

## 2010-11-10 DIAGNOSIS — G629 Polyneuropathy, unspecified: Secondary | ICD-10-CM

## 2010-11-10 LAB — LIPID PANEL
HDL: 64.9 mg/dL (ref >39.00)
LDL Cholesterol: 84 mg/dL (ref 0–99)
Total CHOL/HDL Ratio: 2
VLDL: 12.8 mg/dL (ref 0.0–40.0)

## 2010-11-10 LAB — CBC WITH DIFFERENTIAL/PLATELET
Eosinophils Relative: 1.9 % (ref 0.0–5.0)
HCT: 42.5 % (ref 36.0–46.0)
Hemoglobin: 14.5 g/dL (ref 12.0–15.0)
Lymphs Abs: 1.9 10*3/uL (ref 0.7–4.0)
Monocytes Relative: 9.3 % (ref 3.0–12.0)
Platelets: 180 10*3/uL (ref 150.0–400.0)
WBC: 5.2 10*3/uL (ref 4.5–10.5)

## 2010-11-10 LAB — RENAL FUNCTION PANEL
Albumin: 4.6 g/dL (ref 3.5–5.2)
BUN: 13 mg/dL (ref 6–23)
CO2: 28 mEq/L (ref 19–32)
Calcium: 9.2 mg/dL (ref 8.4–10.5)
Creatinine, Ser: 0.6 mg/dL (ref 0.4–1.2)
GFR: 97 mL/min (ref >60.00)

## 2010-11-10 LAB — FOLATE: Folate: >24.8 ng/mL (ref >5.9)

## 2010-11-10 LAB — TSH: TSH: 0.71 u[IU]/mL (ref 0.35–5.50)

## 2010-11-10 LAB — HEPATIC FUNCTION PANEL
AST: 12 U/L (ref 0–37)
Albumin: 4.6 g/dL (ref 3.5–5.2)
Alkaline Phosphatase: 54 U/L (ref 39–117)

## 2010-11-10 NOTE — Telephone Encounter (Signed)
Left a message for patient to return my call. Pt sent a letter to our office stating she was discontinuing her statin because she thought this was the cause of her knee pain and weakness. MD wants patient to know she agrees with her and if the pain stops 2-4 weeks later we will try another statin (Crestor maybe).

## 2010-11-10 NOTE — Telephone Encounter (Signed)
Patient informed. 

## 2010-11-22 ENCOUNTER — Encounter: Payer: Self-pay | Admitting: Family Medicine

## 2010-11-22 ENCOUNTER — Ambulatory Visit (INDEPENDENT_AMBULATORY_CARE_PROVIDER_SITE_OTHER): Payer: Medicare Other | Admitting: Family Medicine

## 2010-11-22 VITALS — BP 158/88 | HR 72 | Temp 97.9°F | Ht 61.25 in | Wt 136.8 lb

## 2010-11-22 DIAGNOSIS — M21611 Bunion of right foot: Secondary | ICD-10-CM

## 2010-11-22 DIAGNOSIS — M8580 Other specified disorders of bone density and structure, unspecified site: Secondary | ICD-10-CM

## 2010-11-22 DIAGNOSIS — M21619 Bunion of unspecified foot: Secondary | ICD-10-CM

## 2010-11-22 DIAGNOSIS — M25569 Pain in unspecified knee: Secondary | ICD-10-CM

## 2010-11-22 DIAGNOSIS — I10 Essential (primary) hypertension: Secondary | ICD-10-CM

## 2010-11-22 DIAGNOSIS — M899 Disorder of bone, unspecified: Secondary | ICD-10-CM

## 2010-11-22 DIAGNOSIS — M25561 Pain in right knee: Secondary | ICD-10-CM

## 2010-11-22 DIAGNOSIS — E7800 Pure hypercholesterolemia, unspecified: Secondary | ICD-10-CM

## 2010-11-22 DIAGNOSIS — M79605 Pain in left leg: Secondary | ICD-10-CM

## 2010-11-22 DIAGNOSIS — E871 Hypo-osmolality and hyponatremia: Secondary | ICD-10-CM

## 2010-11-22 DIAGNOSIS — M79609 Pain in unspecified limb: Secondary | ICD-10-CM

## 2010-11-22 NOTE — Patient Instructions (Signed)
Knee Pain, General The knee is the complex joint between your thigh and your lower leg. It is made up of bones, tendons, ligaments, and cartilage. The bones that make up the knee are:  The femur in the thigh.   The tibia and fibula in the lower leg.   The patella or kneecap riding in the groove on the lower femur.  CAUSES Knee pain is a common complaint with many causes.  A few of these causes are:  Injury, such as:   A ruptured ligament or tendon injury.   Torn cartilage.    Medical conditions, such as:   Gout   Arthritis   Infections   Overuse, over training or overdoing a physical activity.  Knee pain can be minor or severe. Knee pain can accompany debilitating injury. Minor knee problems often respond well to self-care measures or get well on their own. More serious injuries may need medical intervention or even surgery. Symptoms The knee is complex. Symptoms of knee problems can vary widely. Some of the problems are:  Pain with movement and weight bearing.  Swelling and tenderness.   Buckling of the knee.   Inability to straighten or extend your knee.   Your knee locks and you cannot straighten it.  Warmth and redness with pain and fever.   Deformity or dislocation of the kneecap.   DIAGNOSIS Determining what is wrong may be very straight forward such as when there is an injury. It can also be challenging because of the complexity of the knee. Tests to make a diagnosis may include:  Your caregiver taking a history and doing a physical exam.   Routine X-rays can be used to rule out other problems. X-rays will not reveal a cartilage tear. Some injuries of the knee can be diagnosed by:   Arthroscopy a surgical technique by which a small video camera is inserted through tiny incisions on the sides of the knee. This procedure is used to examine and repair internal knee joint problems. Tiny instruments can be used during arthroscopy to repair the torn knee cartilage  (meniscus).   Arthrography is a radiology technique. A contrast liquid is directly injected into the knee joint. Internal structures of the knee joint then become visible on X-ray film.   An MRI scan is a non x-ray radiology procedure in which magnetic fields and a computer produce two- or three-dimensional images of the inside of the knee. Cartilage tears are often visible using an MRI scanner. MRI scans have largely replaced arthrography in diagnosing cartilage tears of the knee.   Blood work.   Examination of the fluid that helps to lubricate the knee joint (synovial fluid). This is done by taking a sample out using a needle and a syringe.  treatment The treatment of knee problems depends on the cause. Some of these treatments are:  Depending on the injury, proper casting, splinting, surgery or physical therapy care will be needed.   Give yourself adequate recovery time. Do not overuse your joints. If you begin to get sore during workout routines, back off. Slow down or do fewer repetitions.   For repetitive activities such as cycling or running, maintain your strength and nutrition.   Alternate muscle groups. For example if you are a weight lifter, work the upper body on one day and the lower body the next.   Either tight or weak muscles do not give the proper support for your knee. Tight or weak muscles do not absorb the stress placed   on the knee joint. Keep the muscles surrounding the knee strong.   Take care of mechanical problems.   If you have flat feet, orthotics or special shoes may help. See your caregiver if you need help.   Arch supports, sometimes with wedges on the inner or outer aspect of the heel, can help. These can shift pressure away from the side of the knee most bothered by osteoarthritis.   A brace called an "unloader" brace also may be used to help ease the pressure on the most arthritic side of the knee.   If your caregiver has prescribed crutches, braces,  wraps or ice, use as directed. The acronym for this is PRICE. This means protection, rest, ice, compression and elevation.   Nonsteroidal anti-inflammatory drugs (NSAID's), can help relieve pain. But if taken immediately after an injury, they may actually increase swelling. Take NSAID's with food in your stomach. Stop them if you develop stomach problems. Do not take these if you have a history of ulcers, stomach pain or bleeding from the bowel. Do not take without your caregiver's approval if you have problems with fluid retention, heart failure, or kidney problems.   For ongoing knee problems, physical therapy may be helpful.   Glucosamine and chondroitin are over-the-counter dietary supplements. Both may help relieve the pain of osteoarthritis in the knee. These medicines are different from the usual anti-inflammatory drugs. Glucosamine may decrease the rate of cartilage destruction.   Injections of a corticosteroid drug into your knee joint may help reduce the symptoms of an arthritis flare-up. They may provide pain relief that lasts a few months. You may have to wait a few months between injections. The injections do have a small increased risk of infection, water retention and elevated blood sugar levels.   Hyaluronic acid injected into damaged joints may ease pain and provide lubrication. These injections may work by reducing inflammation. A series of shots may give relief for as long as 6 months.   Topical painkillers. Applying certain ointments to your skin may help relieve the pain and stiffness of osteoarthritis. Ask your pharmacist for suggestions. Many over the-counter products are approved for temporary relief of arthritis pain.   In some countries, doctors often prescribe topical NSAID's for relief of chronic conditions such as arthritis and tendinitis. A review of treatment with NSAID creams found that they worked as well as oral medications but without the serious side effects.    Prevention  Maintain a healthy weight. Extra pounds put more strain on your joints.   Get strong, stay limber. Weak muscles are a common cause of knee injuries. Stretching is important. Include flexibility exercises in your workouts.   Be smart about exercise. If you have osteoarthritis, chronic knee pain or recurring injuries, you may need to change the way you exercise. This does not mean you have to stop being active. If your knees ache after jogging or playing basketball, consider switching to swimming, water aerobics or other low-impact activities, at least for a few days a week. Sometimes limiting high-impact activities will provide relief.   Make sure your shoes fit well. Choose footwear that is right for your sport.   Protect your knees. Use the proper gear for knee-sensitive activities. Use kneepads when playing volleyball or laying carpet. Buckle your seat belt every time you drive. Most shattered kneecaps occur in car accidents.   Rest when you are tired.  SEEK MEDICAL CARE IF: You have knee pain that is continual and does not seem   to be getting better.  Seek IMMEDIATE MEDICAL CARE IF:  Your knee joint feels hot to the touch and you have a high fever. MAKE SURE YOU:   Understand these instructions.   Will watch your condition.   Will get help right away if you are not doing well or get worse.  Document Released: 12/31/2006 Document Re-Released: 05/30/2009 [facility] Patient Information 2011 [place], [place].Hypertension (High Blood Pressure) As your heart beats, it forces blood through your arteries. This force is your blood pressure. If the pressure is too high, it is called hypertension (HTN) or high blood pressure. HTN is dangerous because you may have it and not know it. High blood pressure may mean that your heart has to work harder to pump blood. Your arteries may be narrow or stiff. The extra work puts you at risk for heart disease, stroke, and other problems.  Blood  pressure consists of two numbers, a higher number over a lower, 110/72, for example. It is stated as "110 over 72." The ideal is below 120 for the top number (systolic) and under 80 for the bottom (diastolic). Write down your blood pressure today. You should pay close attention to your blood pressure if you have certain conditions such as:  Heart failure.  Prior heart attack.   Diabetes   Chronic kidney disease.   Prior stroke.   Multiple risk factors for heart disease.   To see if you have HTN, your blood pressure should be measured while you are seated with your arm held at the level of the heart. It should be measured at least twice. A one-time elevated blood pressure reading (especially in the Emergency Department) does not mean that you need treatment. There may be conditions in which the blood pressure is different between your right and left arms. It is important to see your caregiver soon for a recheck. Most people have essential hypertension which means that there is not a specific cause. This type of high blood pressure may be lowered by changing lifestyle factors such as:  Stress.  Smoking.   Lack of exercise.   Excessive weight.  Drug/tobacco/alcohol use.   Eating less salt.   Most people do not have symptoms from high blood pressure until it has caused damage to the body. Effective treatment can often prevent, delay or reduce that damage. TREATMENT Treatment for high blood pressure, when a cause has been identified, is directed at the cause. There are a large number of medications to treat HTN. These fall into several categories, and your caregiver will help you select the medicines that are best for you. Medications may have side effects. You should review side effects with your caregiver. If your blood pressure stays high after you have made lifestyle changes or started on medicines,   Your medication(s) may need to be changed.   Other problems may need to be  addressed.   Be certain you understand your prescriptions, and know how and when to take your medicine.   Be sure to follow up with your caregiver within the time frame advised (usually within two weeks) to have your blood pressure rechecked and to review your medications.   If you are taking more than one medicine to lower your blood pressure, make sure you know how and at what times they should be taken. Taking two medicines at the same time can result in blood pressure that is too low.  SEEK IMMEDIATE MEDICAL CARE IF YOU DEVELOP:  A severe headache, blurred or  changing vision, or confusion.   Unusual weakness or numbness, or a faint feeling.   Severe chest or abdominal pain, vomiting, or breathing problems.  MAKE SURE YOU:   Understand these instructions.   Will watch your condition.   Will get help right away if you are not doing well or get worse.  Document Released: 03/05/2005 Document Re-Released: 08/23/2009 [facility] Patient Information 2011 [place], [place].    For the knee pain apply ice for 15 minutes twice daily and then apply Aspercreme. Notify us if not relief  Vitamins  Recommend  Multivitamin 1 tab twice a week, fish oil caps daily, Citracal (calcium citrate) 1 tab po daily

## 2010-11-23 ENCOUNTER — Encounter: Payer: Self-pay | Admitting: Family Medicine

## 2010-11-23 DIAGNOSIS — M8580 Other specified disorders of bone density and structure, unspecified site: Secondary | ICD-10-CM | POA: Insufficient documentation

## 2010-11-23 DIAGNOSIS — E871 Hypo-osmolality and hyponatremia: Secondary | ICD-10-CM | POA: Insufficient documentation

## 2010-11-23 HISTORY — DX: Other specified disorders of bone density and structure, unspecified site: M85.80

## 2010-11-23 NOTE — Assessment & Plan Note (Signed)
Patient continues to manage with loose fitting shoes. She is encouraged to try ice and Aspercreme prn for pain

## 2010-11-23 NOTE — Assessment & Plan Note (Signed)
Patient did not sleep well last night due to the pain. She also reports checking her BP at home regularly and she generally sees systolics 120s and 130s but she saw a 106 last week and felt very tired. We will not make any changes to meds today. Reeval at next visit and patient is going to check her BP at home and notify us if they run hi.

## 2010-11-23 NOTE — Progress Notes (Signed)
Jennifer Peck [id number] [birth year] 11/23/2010      Progress Note-Follow Up  Subjective  Chief Complaint  Chief Complaint  Patient presents with  . Follow-up    1 month follow up  . Knee Pain    HPI  Patient is an 77 year old Caucasian female who is in today for followup of multiple medical problems and she went to the emergency room after her left knee and left calf had significant pain resulting in a fall. Workup was negative for DVT via ultrasound. Pain is better today. She's never had a red, warm, swelling. She does note pain and stiffness. She has long-standing pain in the left knee which is worsening and also has pain in the right knee which is less intense but steady. She's had no further falls. She denies any chest pain, palpitations, fevers, chills, GI or GU complaints otherwise at today's visit. Constipation is relatively well-controlled. She continues to struggle with her peripheral neuropathy and her bunion pain but manages it with appropriate shoes.  Past Medical History  Diagnosis Date  . Hypertension   . Anxiety   . Vertigo   . Coronary artery disease   . Hyperlipidemia   . BCC (basal cell carcinoma), face 11/06/2010  . SCC (squamous cell carcinoma), arm 11/06/2010  . Bilateral bunions 11/06/2010  . Peripheral neuropathy 11/06/2010  . Constipation 11/06/2010  . Hyponatremia 11/23/2010  . Osteopenia 11/23/2010    Past Surgical History  Procedure Date  . Cardiac catheterization 12/20/2009    3 bare metal stents  . Tubal ligation     Family History  Problem Relation Age of Onset  . Stroke Mother   . Heart disease Father   . Hypertension Daughter   . Hyperlipidemia Daughter   . Hypertension Son   . Cancer Daughter     rectal/ chemo and radiation  . Hypertension Daughter   . Hypertension Son   . Hyperlipidemia Son   . Hypertension Son   . Vision loss Maternal Grandfather   . Stroke Paternal Grandmother   . Asthma Paternal Grandfather     History   Social  History  . Marital Status: Single    Spouse Name: N/A    Number of Children: N/A  . Years of Education: N/A   Occupational History  . Not on file.   Social History Main Topics  . Smoking status: Former Smoker    Quit date: 09/04/1977  . Smokeless tobacco: Never Used  . Alcohol Use: Yes     only on Holidays  . Drug Use: No  . Sexually Active: Not on file   Other Topics Concern  . Not on file   Social History Narrative  . No narrative on file    Current Outpatient Prescriptions on File Prior to Visit  Medication Sig Dispense Refill  . ALPRAZolam (XANAX) 0.25 MG tablet Take 0.25 mg by mouth daily as needed. Panic attack       . aspirin 325 MG tablet Take 325 mg by mouth daily.        . calcium carbonate (OS-CAL) 600 MG TABS Take 600 mg by mouth daily. Takes occ      . cetirizine (ZYRTEC) 10 MG tablet Take 10 mg by mouth daily as needed. Seasonal allergies       . co-enzyme Q-10 30 MG capsule Take 30 mg by mouth daily.       . enalapril (VASOTEC) 20 MG tablet Take 20 mg by mouth 2 (two) times daily.        Marland Kitchen  famotidine (PEPCID) 20 MG tablet Take 20 mg by mouth daily.       Marland Kitchen Ketotifen Fumarate (ALLERGY EYE DROPS OP) Place 2 drops into both eyes as needed. Red itchy eyes       . magnesium oxide (MAG-OX) 400 MG tablet Take 400 mg by mouth daily.        . metoprolol succinate (TOPROL-XL) 25 MG 24 hr tablet Take 25 mg by mouth daily. Takes 1 tab daily       . Multiple Vitamin (MULTIVITAMIN) tablet Take 1 tablet by mouth daily. Takes occ      . NAFTIN 1 % cream       . Omega-3 Fatty Acids (FISH OIL PO) Take 1 capsule by mouth daily.       . Sodium Chloride-Sodium Bicarb (RA SINUS WASH NETI POT NA) Place into the nose as needed. sinuses         Allergies  Allergen Reactions  . Atenolol     Caused fatigue   . Codeine Nausea And Vomiting  . Gabapentin     Memory loss and confusion  . Simvastatin     Her leg gave away on her and states thinks secondary to Simvastatin     Review of Systems  Review of Systems  Constitutional: Negative for fever and malaise/fatigue.  HENT: Negative for congestion.   Eyes: Negative for discharge.  Respiratory: Negative for shortness of breath.   Cardiovascular: Negative for chest pain, palpitations and leg swelling.  Gastrointestinal: Negative for nausea, abdominal pain and diarrhea.  Genitourinary: Negative for dysuria.  Musculoskeletal: Positive for joint pain and falls.       B/l knee pain l>r  Had one fall when her left leg collapsed from under her due to pain, this has not recurred and she denies any resulting injury, was evaluated in er  Skin: Negative for rash.  Neurological: Negative for loss of consciousness and headaches.  Endo/Heme/Allergies: Negative for polydipsia.  Psychiatric/Behavioral: Negative for depression and suicidal ideas. The patient is not nervous/anxious and does not have insomnia.     Objective  BP 158/88  Pulse 72  Temp(Src) 97.9 F (36.6 C) (Oral)  Ht 5' 1.25" (1.556 m)  Wt 136 lb 12.8 oz (62.052 kg)  BMI 25.64 kg/m2  SpO2 98%  Physical Exam  Physical Exam  Constitutional: She is oriented to person, place, and time. She appears well-developed and well-nourished. No distress.  HENT:  Head: Normocephalic and atraumatic.  Nose: Nose normal.  Mouth/Throat: Oropharynx is clear and moist. No oropharyngeal exudate.  Eyes: EOM are normal. Right eye exhibits no discharge. Left eye exhibits no discharge. No scleral icterus.  Neck: Normal range of motion. Neck supple. No thyromegaly present.  Cardiovascular: Normal rate and regular rhythm.  Exam reveals no gallop.   Murmur heard. Pulmonary/Chest: Effort normal and breath sounds normal. No respiratory distress. She has no wheezes. She has no rales.  Abdominal: Soft. Bowel sounds are normal. She exhibits no distension.  Musculoskeletal: Normal range of motion. She exhibits no edema and no tenderness.  Neurological: She is alert and  oriented to person, place, and time.  Skin: Skin is warm and dry. She is not diaphoretic. No erythema.  Psychiatric: She has a normal mood and affect. Her behavior is normal. Thought content normal.     Lab Results  Component Value Date   TSH 0.71 11/10/2010   Lab Results  Component Value Date   WBC 5.2 11/10/2010   HGB 14.5 11/10/2010  HCT 42.5 11/10/2010   MCV 94.6 11/10/2010   PLT 180.0 11/10/2010   Lab Results  Component Value Date   CREATININE 0.6 11/10/2010   BUN 13 11/10/2010   NA 133* 11/10/2010   K 4.2 11/10/2010   CL 97 11/10/2010   CO2 28 11/10/2010   Lab Results  Component Value Date   ALT 14 11/10/2010   AST 12 11/10/2010   ALKPHOS 54 11/10/2010   BILITOT 0.5 11/10/2010   Lab Results  Component Value Date   CHOL 162 11/10/2010   Lab Results  Component Value Date   HDL 64.90 11/10/2010   Lab Results  Component Value Date   LDLCALC 84 11/10/2010   Lab Results  Component Value Date   TRIG 64.0 11/10/2010   Lab Results  Component Value Date   CHOLHDL 2 11/10/2010     Assessment & Plan  Hypertension Patient did not sleep well last night due to the pain. She also reports checking her BP at home regularly and she generally sees systolics 120s and 130s but she saw a 106 last week and felt very tired. We will not make any changes to meds today. Reeval at next visit and patient is going to check her BP at home and notify us if they run hi.   Hyponatremia Patient sodium was 133 last check encouraged to continue to drink adequate fluids but not to excess and we will recheck renal at next visit.  Bilateral bunions Patient continues to manage with loose fitting shoes. She is encouraged to try ice and Aspercreme prn for pain  Left leg pain Recent increase in pain, she reports pain over medial aspect of knee and down into calf. Pain became bad enough that she presented to the ER and [place] was neg for DVT. Her pain is improving. She is having some pain and stiffness in right  knee as well. No swelling,redness or acute injury. Patient agrees to try ice and Aspercreme and proceed with xrays if pain does not improve we will refer to ortho for further evaluation.  Osteopenia Encouraged her to start Citracal and she will need a repeat bone scan in the future  Pure hypercholesterolemia Needs to continue fish oil and avoid trans fats, will repeat lipid panel with next visit

## 2010-11-23 NOTE — Assessment & Plan Note (Signed)
Needs to continue fish oil and avoid trans fats, will repeat lipid panel with next visit

## 2010-11-23 NOTE — Assessment & Plan Note (Signed)
Recent increase in pain, she reports pain over medial aspect of knee and down into calf. Pain became bad enough that she presented to the ER and [place] was neg for DVT. Her pain is improving. She is having some pain and stiffness in right knee as well. No swelling,redness or acute injury. Patient agrees to try ice and Aspercreme and proceed with xrays if pain does not improve we will refer to ortho for further evaluation.

## 2010-11-23 NOTE — Assessment & Plan Note (Signed)
Encouraged her to start Citracal and she will need a repeat bone scan in the future

## 2010-11-23 NOTE — Assessment & Plan Note (Signed)
Patient sodium was 133 last check encouraged to continue to drink adequate fluids but not to excess and we will recheck renal at next visit.

## 2010-11-24 ENCOUNTER — Ambulatory Visit
Admission: RE | Admit: 2010-11-24 | Discharge: 2010-11-24 | Disposition: A | Discharge status: Home / Self Care | Payer: Medicare Other | Source: Ambulatory Visit | Attending: Family Medicine | Admitting: Family Medicine

## 2010-11-24 DIAGNOSIS — M25561 Pain in right knee: Secondary | ICD-10-CM

## 2010-11-27 NOTE — Progress Notes (Signed)
Pt left a message stating she would not like to proceed with the ortho referral. Instead she stated she would like to know if the cholesterol problem is causing the pain.

## 2010-12-06 ENCOUNTER — Ambulatory Visit: Payer: Medicare Other | Admitting: Family Medicine

## 2010-12-22 ENCOUNTER — Ambulatory Visit (INDEPENDENT_AMBULATORY_CARE_PROVIDER_SITE_OTHER): Payer: Medicare Other | Admitting: Family Medicine

## 2010-12-22 ENCOUNTER — Encounter: Payer: Self-pay | Admitting: Family Medicine

## 2010-12-22 VITALS — BP 126/80 | HR 70 | Temp 97.7°F | Ht 61.25 in | Wt 137.4 lb

## 2010-12-22 DIAGNOSIS — E7800 Pure hypercholesterolemia, unspecified: Secondary | ICD-10-CM

## 2010-12-22 DIAGNOSIS — M79609 Pain in unspecified limb: Secondary | ICD-10-CM

## 2010-12-22 DIAGNOSIS — M79605 Pain in left leg: Secondary | ICD-10-CM

## 2010-12-22 DIAGNOSIS — E871 Hypo-osmolality and hyponatremia: Secondary | ICD-10-CM

## 2010-12-22 DIAGNOSIS — I10 Essential (primary) hypertension: Secondary | ICD-10-CM

## 2010-12-22 DIAGNOSIS — Z23 Encounter for immunization: Secondary | ICD-10-CM

## 2010-12-22 DIAGNOSIS — K5900 Constipation, unspecified: Secondary | ICD-10-CM

## 2010-12-22 DIAGNOSIS — E785 Hyperlipidemia, unspecified: Secondary | ICD-10-CM

## 2010-12-22 LAB — RENAL FUNCTION PANEL
Albumin: 4.6 g/dL (ref 3.5–5.2)
BUN: 16 mg/dL (ref 6–23)
Calcium: 9.5 mg/dL (ref 8.4–10.5)
Creatinine, Ser: 0.7 mg/dL (ref 0.4–1.2)
Glucose, Bld: 101 mg/dL — ABNORMAL HIGH (ref 70–99)
Phosphorus: 3.7 mg/dL (ref 2.3–4.6)
Potassium: 5.2 mEq/L — ABNORMAL HIGH (ref 3.5–5.1)

## 2010-12-22 LAB — HEPATIC FUNCTION PANEL
ALT: 16 U/L (ref 0–35)
Albumin: 4.6 g/dL (ref 3.5–5.2)
Total Protein: 7.6 g/dL (ref 6.0–8.3)

## 2010-12-22 MED ORDER — LOVASTATIN 10 MG PO TABS: ORAL_TABLET | ORAL | Status: DC

## 2010-12-22 NOTE — Patient Instructions (Signed)

## 2010-12-31 ENCOUNTER — Encounter: Payer: Self-pay | Admitting: Family Medicine

## 2010-12-31 NOTE — Progress Notes (Signed)
Jennifer Peck [id number] Feb 14, 1923 12/31/2010      Progress Note-Follow Up  Subjective  Chief Complaint  Chief Complaint  Patient presents with  . Follow-up    1 month follow up    HPI  Is an 77 year old Caucasian female who is in today for follow up on an appointment. Overall she reports she is doing well. At her last visit she was struggling with bilateral knee pain and left calf pain. She is pleased with the improvement. She was able to change or alter her footwear and feels that has been helpful. She continues with her exercise regimen after a short break and reports she is tolerating that well. No other recent illness, fevers, chills, chest pain, palpitations, shortness of breath, GI or GU complaints noted at this time.  Past Medical History  Diagnosis Date  . Hypertension   . Anxiety   . Vertigo   . Coronary artery disease   . Hyperlipidemia   . BCC (basal cell carcinoma), face 11/06/2010  . SCC (squamous cell carcinoma), arm 11/06/2010  . Bilateral bunions 11/06/2010  . Peripheral neuropathy 11/06/2010  . Constipation 11/06/2010  . Hyponatremia 11/23/2010  . Osteopenia 11/23/2010    Past Surgical History  Procedure Date  . Cardiac catheterization 12/20/2009    3 bare metal stents  . Tubal ligation     Family History  Problem Relation Age of Onset  . Stroke Mother   . Heart disease Father   . Hypertension Daughter   . Hyperlipidemia Daughter   . Hypertension Son   . Cancer Daughter     rectal/ chemo and radiation  . Hypertension Daughter   . Hypertension Son   . Hyperlipidemia Son   . Hypertension Son   . Vision loss Maternal Grandfather   . Stroke Paternal Grandmother   . Asthma Paternal Grandfather     History   Social History  . Marital Status: Single    Spouse Name: N/A    Number of Children: N/A  . Years of Education: N/A   Occupational History  . Not on file.   Social History Main Topics  . Smoking status: Former Smoker    Quit date:  09/04/1977  . Smokeless tobacco: Never Used  . Alcohol Use: Yes     only on Holidays  . Drug Use: No  . Sexually Active: Not on file   Other Topics Concern  . Not on file   Social History Narrative  . No narrative on file    Current Outpatient Prescriptions on File Prior to Visit  Medication Sig Dispense Refill  . ALPRAZolam (XANAX) 0.25 MG tablet Take 0.25 mg by mouth daily as needed. Panic attack       . aspirin 325 MG tablet Take 325 mg by mouth daily.        . calcium carbonate (OS-CAL) 600 MG TABS Take 600 mg by mouth daily. Takes occ      . cetirizine (ZYRTEC) 10 MG tablet Take 10 mg by mouth daily as needed. Seasonal allergies       . co-enzyme Q-10 30 MG capsule Take 30 mg by mouth daily.       . enalapril (VASOTEC) 20 MG tablet Take 20 mg by mouth 2 (two) times daily.        . famotidine (PEPCID) 20 MG tablet Take 20 mg by mouth daily.       Marland Kitchen Ketotifen Fumarate (ALLERGY EYE DROPS OP) Place 2 drops into both eyes as needed.  Red itchy eyes       . magnesium oxide (MAG-OX) 400 MG tablet Take 400 mg by mouth daily.        . metoprolol succinate (TOPROL-XL) 25 MG 24 hr tablet Take 25 mg by mouth daily. Takes 1 tab daily       . Multiple Vitamin (MULTIVITAMIN) tablet Take 1 tablet by mouth daily. Takes occ      . NAFTIN 1 % cream       . Omega-3 Fatty Acids (FISH OIL PO) Take 1 capsule by mouth daily.       . Sodium Chloride-Sodium Bicarb (RA SINUS WASH NETI POT NA) Place into the nose as needed. sinuses         Allergies  Allergen Reactions  . Atenolol     Caused fatigue   . Codeine Nausea And Vomiting  . Gabapentin     Memory loss and confusion  . Simvastatin     Her leg gave away on her and states thinks secondary to Simvastatin    Review of Systems  Review of Systems  Constitutional: Negative for fever and malaise/fatigue.  HENT: Negative for congestion.   Eyes: Negative for discharge.  Respiratory: Negative for shortness of breath.   Cardiovascular: Negative  for chest pain, palpitations and leg swelling.  Gastrointestinal: Negative for nausea, abdominal pain and diarrhea.  Genitourinary: Negative for dysuria.  Musculoskeletal: Negative for falls.  Skin: Negative for rash.  Neurological: Negative for loss of consciousness and headaches.  Endo/Heme/Allergies: Negative for polydipsia.  Psychiatric/Behavioral: Negative for depression and suicidal ideas. The patient is not nervous/anxious and does not have insomnia.     Objective  BP 126/80  Pulse 70  Temp(Src) 97.7 F (36.5 C) (Oral)  Ht 5' 1.25" (1.556 m)  Wt 137 lb 6.4 oz (62.324 kg)  BMI 25.75 kg/m2  SpO2 96%  Physical Exam  Physical Exam  Constitutional: She is oriented to person, place, and time and well-developed, well-nourished, and in no distress. No distress.  HENT:  Head: Normocephalic and atraumatic.  Eyes: Conjunctivae are normal.  Neck: Neck supple. No thyromegaly present.  Cardiovascular: Normal rate, regular rhythm and normal heart sounds.   No murmur heard. Pulmonary/Chest: Effort normal and breath sounds normal. She has no wheezes.  Abdominal: She exhibits no distension and no mass.  Musculoskeletal: She exhibits no edema.  Lymphadenopathy:    She has no cervical adenopathy.  Neurological: She is alert and oriented to person, place, and time.  Skin: Skin is warm and dry. No rash noted. She is not diaphoretic.  Psychiatric: Memory, affect and judgment normal.    Lab Results  Component Value Date   TSH 0.71 11/10/2010   Lab Results  Component Value Date   WBC 5.2 11/10/2010   HGB 14.5 11/10/2010   HCT 42.5 11/10/2010   MCV 94.6 11/10/2010   PLT 180.0 11/10/2010   Lab Results  Component Value Date   CREATININE 0.7 12/22/2010   BUN 16 12/22/2010   NA 135 12/22/2010   K 5.2* 12/22/2010   CL 98 12/22/2010   CO2 28 12/22/2010   Lab Results  Component Value Date   ALT 16 12/22/2010   AST 13 12/22/2010   ALKPHOS 61 12/22/2010   BILITOT 0.6 12/22/2010   Lab Results   Component Value Date   CHOL 162 11/10/2010   Lab Results  Component Value Date   HDL 64.90 11/10/2010   Lab Results  Component Value Date   LDLCALC 84 11/10/2010  Lab Results  Component Value Date   TRIG 64.0 11/10/2010   Lab Results  Component Value Date   CHOLHDL 2 11/10/2010     Assessment & Plan  Hyponatremia Mild patient agrees to recheck of levels  Hypertension Well controlled on repeat no change in medications  Pure hypercholesterolemia Avoid trans fats, take Lovastatin 10mg  daily, monitor lipid panels  Constipation Encouraged hi fiber diet and adquate hydration. May consider Docusate or Senna  Left leg pain Improved with change in foot wear is no longer struggling with calf or knee pain, patein tis encouraged to continue her exercise routine

## 2010-12-31 NOTE — Assessment & Plan Note (Signed)
Mild patient agrees to recheck of levels

## 2010-12-31 NOTE — Assessment & Plan Note (Addendum)
Avoid trans fats, take Lovastatin 10mg  daily, monitor lipid panels

## 2010-12-31 NOTE — Assessment & Plan Note (Signed)
Encouraged hi fiber diet and adquate hydration. May consider Docusate or Senna

## 2010-12-31 NOTE — Assessment & Plan Note (Signed)
Well controlled on repeat no change in medications

## 2010-12-31 NOTE — Assessment & Plan Note (Signed)
Improved with change in foot wear is no longer struggling with calf or knee pain, patein tis encouraged to continue her exercise routine

## 2011-01-12 ENCOUNTER — Other Ambulatory Visit (INDEPENDENT_AMBULATORY_CARE_PROVIDER_SITE_OTHER): Payer: Medicare Other

## 2011-01-12 DIAGNOSIS — E875 Hyperkalemia: Secondary | ICD-10-CM

## 2011-01-12 LAB — POTASSIUM: Potassium: 5.3 mEq/L — ABNORMAL HIGH (ref 3.5–5.1)

## 2011-01-15 ENCOUNTER — Telehealth: Payer: Self-pay | Admitting: *Deleted

## 2011-01-15 NOTE — Telephone Encounter (Signed)
Results of recent labs given.  Pt would like to try changing blood pressure medication.  She has read that ACE inhibitors can cause an increase in potassium levels, and she is having a very hard time adhering to a low potassium diet.  She would like an older generic as she has had side effects with newer blood pressure meds.  She has taken atenolol with good effects in the past but had increased drowsiness after 10 years of use.

## 2011-01-15 NOTE — Telephone Encounter (Signed)
VM left from pt requesting results of potassium and what normal level is.  RC to pt.  Message left on voicemail to return call.

## 2011-01-15 NOTE — Telephone Encounter (Signed)
We can drop her Enalapril to 20mg  just once a day and we can increase Metoprolol xl to 50mg  qd then in a week we can recheck her bp and renal and continue to adjust accordingly until we find the right balance

## 2011-01-16 NOTE — Telephone Encounter (Signed)
Pt informed and appt scheduled for 01-24-11

## 2011-01-22 ENCOUNTER — Encounter: Payer: Self-pay | Admitting: Family Medicine

## 2011-01-22 ENCOUNTER — Ambulatory Visit (INDEPENDENT_AMBULATORY_CARE_PROVIDER_SITE_OTHER): Payer: Medicare Other | Admitting: Family Medicine

## 2011-01-22 VITALS — BP 164/92 | HR 70 | Temp 98.3°F | Ht 61.25 in | Wt 132.8 lb

## 2011-01-22 DIAGNOSIS — I10 Essential (primary) hypertension: Secondary | ICD-10-CM

## 2011-01-22 DIAGNOSIS — E875 Hyperkalemia: Secondary | ICD-10-CM

## 2011-01-22 LAB — RENAL FUNCTION PANEL
Calcium: 9.5 mg/dL (ref 8.4–10.5)
Creatinine, Ser: 0.9 mg/dL (ref 0.4–1.2)
Glucose, Bld: 107 mg/dL — ABNORMAL HIGH (ref 70–99)
Phosphorus: 3.3 mg/dL (ref 2.3–4.6)
Potassium: 4.1 mEq/L (ref 3.5–5.1)
Sodium: 137 mEq/L (ref 135–145)

## 2011-01-22 MED ORDER — NEBIVOLOL HCL 5 MG PO TABS: 5.0000 mg | ORAL_TABLET | Freq: Every day | ORAL | Status: DC

## 2011-01-22 NOTE — Patient Instructions (Signed)

## 2011-01-23 ENCOUNTER — Encounter: Payer: Self-pay | Admitting: Family Medicine

## 2011-01-23 ENCOUNTER — Telehealth: Payer: Self-pay

## 2011-01-23 DIAGNOSIS — Z8639 Personal history of other endocrine, nutritional and metabolic disease: Secondary | ICD-10-CM

## 2011-01-23 DIAGNOSIS — E875 Hyperkalemia: Secondary | ICD-10-CM | POA: Insufficient documentation

## 2011-01-23 HISTORY — DX: Personal history of other endocrine, nutritional and metabolic disease: Z86.39

## 2011-01-23 NOTE — Progress Notes (Signed)
Jennifer Peck [id number] 1923-02-15 01/23/2011      Progress Note-Follow Up  Subjective  Chief Complaint  Chief Complaint  Patient presents with  . Hypertension    elevated    HPI  Patient is a 52 total Caucasian female who is in today for evaluation of her blood pressure. She notes over the last 5-6 days her blood pressures were trending up. She was feeling somewhat anxious and sweaty last week so she checked her blood pressure and systolic was 146 and the next day was 182 with a diastolic of 99 and a pulse of 84. On Sunday it was 177/97 with a pulse of 64 and then on Monday was 159/95. We did recently go off her enalapril to once daily dosing and increase her metoprolol to twice daily dosing and since retirement she also notes increased trouble constipation and fatigue. She notes that her blood pressure is up she feels more anxious, flushed and short of breath as well. She takes MiraLax for constipation and that was somewhat helpful. No recent illness, fevers, chest pain or palpitations, GU complaints.  Past Medical History  Diagnosis Date  . Hypertension   . Anxiety   . Vertigo   . Coronary artery disease   . Hyperlipidemia   . BCC (basal cell carcinoma), face 11/06/2010  . SCC (squamous cell carcinoma), arm 11/06/2010  . Bilateral bunions 11/06/2010  . Peripheral neuropathy 11/06/2010  . Constipation 11/06/2010  . Hyponatremia 11/23/2010  . Osteopenia 11/23/2010  . Hyperkalemia 01/23/2011    Past Surgical History  Procedure Date  . Cardiac catheterization 12/20/2009    3 bare metal stents  . Tubal ligation     Family History  Problem Relation Age of Onset  . Stroke Mother   . Heart disease Father   . Hypertension Daughter   . Hyperlipidemia Daughter   . Hypertension Son   . Cancer Daughter     rectal/ chemo and radiation  . Hypertension Daughter   . Hypertension Son   . Hyperlipidemia Son   . Hypertension Son   . Vision loss Maternal Grandfather   . Stroke Paternal  Grandmother   . Asthma Paternal Grandfather     History   Social History  . Marital Status: Single    Spouse Name: N/A    Number of Children: N/A  . Years of Education: N/A   Occupational History  . Not on file.   Social History Main Topics  . Smoking status: Former Smoker    Quit date: 09/04/1977  . Smokeless tobacco: Never Used  . Alcohol Use: Yes     only on Holidays  . Drug Use: No  . Sexually Active: Not on file   Other Topics Concern  . Not on file   Social History Narrative  . No narrative on file    Current Outpatient Prescriptions on File Prior to Visit  Medication Sig Dispense Refill  . ALPRAZolam (XANAX) 0.25 MG tablet Take 0.25 mg by mouth daily as needed. Panic attack       . aspirin 325 MG tablet Take 325 mg by mouth daily.        . calcium carbonate (OS-CAL) 600 MG TABS Take 600 mg by mouth daily. Takes occ      . cetirizine (ZYRTEC) 10 MG tablet Take 10 mg by mouth daily as needed. Seasonal allergies       . co-enzyme Q-10 30 MG capsule Take 30 mg by mouth daily.       Marland Kitchen  enalapril (VASOTEC) 20 MG tablet Take 20 mg by mouth daily.       . famotidine (PEPCID) 20 MG tablet Take 20 mg by mouth daily.       Marland Kitchen Ketotifen Fumarate (ALLERGY EYE DROPS OP) Place 2 drops into both eyes as needed. Red itchy eyes       . lovastatin (MEVACOR) 10 MG tablet 1/2 tab po qhs  30 tablet  5  . magnesium oxide (MAG-OX) 400 MG tablet Take 400 mg by mouth daily.        . Multiple Vitamin (MULTIVITAMIN) tablet Take 1 tablet by mouth daily. Takes occ      . NAFTIN 1 % cream       . Omega-3 Fatty Acids (FISH OIL PO) Take 1 capsule by mouth daily.       . Sodium Chloride-Sodium Bicarb (RA SINUS WASH NETI POT NA) Place into the nose as needed. sinuses         Allergies  Allergen Reactions  . Atenolol     Caused fatigue   . Codeine Nausea And Vomiting  . Gabapentin     Memory loss and confusion  . Simvastatin     Her leg gave away on her and states thinks secondary to  Simvastatin    Review of Systems  Review of Systems  Constitutional: Positive for malaise/fatigue. Negative for fever.  HENT: Negative for congestion.   Eyes: Negative for discharge.  Respiratory: Negative for shortness of breath.   Cardiovascular: Negative for chest pain, palpitations and leg swelling.  Gastrointestinal: Positive for constipation. Negative for nausea, abdominal pain, diarrhea and blood in stool.  Genitourinary: Negative for dysuria.  Musculoskeletal: Negative for falls.  Skin: Negative for rash.  Neurological: Negative for loss of consciousness and headaches.  Endo/Heme/Allergies: Negative for polydipsia.  Psychiatric/Behavioral: Negative for depression and suicidal ideas. The patient is nervous/anxious. The patient does not have insomnia.     Objective  BP 164/92  Pulse 70  Temp(Src) 98.3 F (36.8 C) (Oral)  Ht 5' 1.25" (1.556 m)  Wt 132 lb 12.8 oz (60.238 kg)  BMI 24.89 kg/m2  SpO2 97%  Physical Exam  Physical Exam  Constitutional: She is oriented to person, place, and time and well-developed, well-nourished, and in no distress. No distress.  HENT:  Head: Normocephalic and atraumatic.  Eyes: Conjunctivae are normal.  Neck: Neck supple. No thyromegaly present.  Cardiovascular: Normal rate, regular rhythm and normal heart sounds.   Pulmonary/Chest: Effort normal and breath sounds normal. She has no wheezes.  Abdominal: She exhibits no distension and no mass.  Musculoskeletal: She exhibits no edema.  Lymphadenopathy:    She has no cervical adenopathy.  Neurological: She is alert and oriented to person, place, and time.  Skin: Skin is warm and dry. No rash noted. She is not diaphoretic.  Psychiatric: Memory, affect and judgment normal.       anxious    Lab Results  Component Value Date   TSH 0.71 11/10/2010   Lab Results  Component Value Date   WBC 5.2 11/10/2010   HGB 14.5 11/10/2010   HCT 42.5 11/10/2010   MCV 94.6 11/10/2010   PLT 180.0  11/10/2010   Lab Results  Component Value Date   CREATININE 0.9 01/22/2011   BUN 13 01/22/2011   NA 137 01/22/2011   K 4.1 01/22/2011   CL 100 01/22/2011   CO2 26 01/22/2011   Lab Results  Component Value Date   ALT 16 12/22/2010  AST 13 12/22/2010   ALKPHOS 61 12/22/2010   BILITOT 0.6 12/22/2010   Lab Results  Component Value Date   CHOL 162 11/10/2010   Lab Results  Component Value Date   HDL 64.90 11/10/2010   Lab Results  Component Value Date   LDLCALC 84 11/10/2010   Lab Results  Component Value Date   TRIG 64.0 11/10/2010   Lab Results  Component Value Date   CHOLHDL 2 11/10/2010     Assessment & Plan  Hypertension Patient with recent increase in BP, she has been feeling flushed and anxious and then when she checks her pressure it is as hi as 182/99 at home with a pulse of 84, her lowest pulse this week has been. She also admits to increased trouble with constipation and fatigue since increasing her Metoprolol. We will stop this and switch her to bystolic 5 mg daily, samples provided, maintain her Enalapril at just one dose per day and we will recheck bp later this week  Hyperkalemia Improved with decrease in Enalapril dosing to once daily.

## 2011-01-23 NOTE — Telephone Encounter (Signed)
Pt states she would like to know how to titrate off the metoprolol because she read literature and its states that it could be dangerous to just stop metoprolol and start another medication? Please advise?

## 2011-01-23 NOTE — Assessment & Plan Note (Signed)
Improved with decrease in Enalapril dosing to once daily.

## 2011-01-23 NOTE — Telephone Encounter (Signed)
No she is OK because we have just replaced it with another beta blocker so she is fine

## 2011-01-23 NOTE — Telephone Encounter (Signed)
Left a message for patient to return my call. 

## 2011-01-23 NOTE — Telephone Encounter (Signed)
PATIENT INFORMED

## 2011-01-23 NOTE — Assessment & Plan Note (Signed)
Patient with recent increase in BP, she has been feeling flushed and anxious and then when she checks her pressure it is as hi as 182/99 at home with a pulse of 84, her lowest pulse this week has been. She also admits to increased trouble with constipation and fatigue since increasing her Metoprolol. We will stop this and switch her to bystolic 5 mg daily, samples provided, maintain her Enalapril at just one dose per day and we will recheck bp later this week

## 2011-01-24 ENCOUNTER — Ambulatory Visit: Payer: Medicare Other | Admitting: Family Medicine

## 2011-01-26 ENCOUNTER — Ambulatory Visit (INDEPENDENT_AMBULATORY_CARE_PROVIDER_SITE_OTHER): Payer: Medicare Other | Admitting: Family Medicine

## 2011-01-26 ENCOUNTER — Encounter: Payer: Self-pay | Admitting: Family Medicine

## 2011-01-26 DIAGNOSIS — R5383 Other fatigue: Secondary | ICD-10-CM

## 2011-01-26 DIAGNOSIS — J329 Chronic sinusitis, unspecified: Secondary | ICD-10-CM

## 2011-01-26 DIAGNOSIS — I10 Essential (primary) hypertension: Secondary | ICD-10-CM

## 2011-01-26 DIAGNOSIS — E875 Hyperkalemia: Secondary | ICD-10-CM

## 2011-01-26 DIAGNOSIS — R82998 Other abnormal findings in urine: Secondary | ICD-10-CM

## 2011-01-26 DIAGNOSIS — R413 Other amnesia: Secondary | ICD-10-CM

## 2011-01-26 DIAGNOSIS — R5381 Other malaise: Secondary | ICD-10-CM

## 2011-01-26 LAB — POCT URINALYSIS DIPSTICK
Blood, UA: NEGATIVE
Nitrite, UA: NEGATIVE
Protein, UA: NEGATIVE
Spec Grav, UA: <=1.005
Urobilinogen, UA: 0.2

## 2011-01-26 LAB — RENAL FUNCTION PANEL
Albumin: 4.6 g/dL (ref 3.5–5.2)
BUN: 9 mg/dL (ref 6–23)
Chloride: 100 mEq/L (ref 96–112)
GFR: 67.37 mL/min (ref >60.00)
Glucose, Bld: 89 mg/dL (ref 70–99)
Phosphorus: 3 mg/dL (ref 2.3–4.6)
Potassium: 4.1 mEq/L (ref 3.5–5.1)

## 2011-01-26 LAB — CBC
MCV: 94.8 fL (ref 78.0–100.0)
Platelets: 185 10*3/uL (ref 150–400)
RBC: 4.65 MIL/uL (ref 3.87–5.11)
RDW: 13.4 % (ref 11.5–15.5)
WBC: 5.2 10*3/uL (ref 4.0–10.5)

## 2011-01-26 LAB — TSH: TSH: 0.67 u[IU]/mL (ref 0.35–5.50)

## 2011-01-26 MED ORDER — METOPROLOL SUCCINATE ER 25 MG PO TB24: ORAL_TABLET | ORAL | Status: DC

## 2011-01-26 MED ORDER — ENALAPRIL MALEATE 20 MG PO TABS: 20.0000 mg | ORAL_TABLET | Freq: Every day | ORAL | Status: DC

## 2011-01-26 MED ORDER — CEFDINIR 300 MG PO CAPS: 300.0000 mg | ORAL_CAPSULE | Freq: Two times a day (BID) | ORAL | Status: AC

## 2011-01-26 MED ORDER — ALIGN 4 MG PO CAPS: 1.0000 | ORAL_CAPSULE | Freq: Every day | ORAL | Status: DC

## 2011-01-26 NOTE — Patient Instructions (Signed)

## 2011-01-31 ENCOUNTER — Ambulatory Visit (INDEPENDENT_AMBULATORY_CARE_PROVIDER_SITE_OTHER): Payer: Medicare Other | Admitting: Family Medicine

## 2011-01-31 ENCOUNTER — Encounter: Payer: Self-pay | Admitting: Family Medicine

## 2011-01-31 DIAGNOSIS — J019 Acute sinusitis, unspecified: Secondary | ICD-10-CM

## 2011-01-31 DIAGNOSIS — E875 Hyperkalemia: Secondary | ICD-10-CM

## 2011-01-31 DIAGNOSIS — I10 Essential (primary) hypertension: Secondary | ICD-10-CM

## 2011-01-31 DIAGNOSIS — F419 Anxiety disorder, unspecified: Secondary | ICD-10-CM

## 2011-01-31 DIAGNOSIS — E871 Hypo-osmolality and hyponatremia: Secondary | ICD-10-CM

## 2011-01-31 DIAGNOSIS — F411 Generalized anxiety disorder: Secondary | ICD-10-CM

## 2011-01-31 NOTE — Progress Notes (Signed)
Jennifer Peck [id number] 1922/07/13 01/31/2011      Progress Note-Follow Up  Subjective  Chief Complaint  Chief Complaint  Patient presents with  . Follow-up    5 day follow up- on BP    HPI  Patient is a 66 yoCaucasian female who is in today for full sinusitis and hypertension much better. She reports after starting the septum near her sinuses opened up she had a lot of rhinorrhea. Presently no fevers, chills, malaise, anxiety, mental status changes. Although symptoms were of concern to her last week. Overall she feels much better. She denies any chest pain, palpitations, shortness of breath, ear pain. She is pleased with her response to the metoprolol and the enalapril to once a day. And so she feels more herself at this point. No new or acute complaints no GI or GU concerns. She is eating yogurt daily to prevent diarrhea on the antibiotic and is pleased with this result.  Past Medical History  Diagnosis Date  . Hypertension   . Anxiety   . Vertigo   . Coronary artery disease   . Hyperlipidemia   . BCC (basal cell carcinoma), face 11/06/2010  . SCC (squamous cell carcinoma), arm 11/06/2010  . Bilateral bunions 11/06/2010  . Peripheral neuropathy 11/06/2010  . Constipation 11/06/2010  . Hyponatremia 11/23/2010  . Osteopenia 11/23/2010  . Hyperkalemia 01/23/2011    Past Surgical History  Procedure Date  . Cardiac catheterization 12/20/2009    3 bare metal stents  . Tubal ligation     Family History  Problem Relation Age of Onset  . Stroke Mother   . Heart disease Father   . Hypertension Daughter   . Hyperlipidemia Daughter   . Hypertension Son   . Cancer Daughter     rectal/ chemo and radiation  . Hypertension Daughter   . Hypertension Son   . Hyperlipidemia Son   . Hypertension Son   . Vision loss Maternal Grandfather   . Stroke Paternal Grandmother   . Asthma Paternal Grandfather     History   Social History  . Marital Status: Single    Spouse Name: N/A   Number of Children: N/A  . Years of Education: N/A   Occupational History  . Not on file.   Social History Main Topics  . Smoking status: Former Smoker    Quit date: 09/04/1977  . Smokeless tobacco: Never Used  . Alcohol Use: Yes     only on Holidays  . Drug Use: No  . Sexually Active: Not on file   Other Topics Concern  . Not on file   Social History Narrative  . No narrative on file    Current Outpatient Prescriptions on File Prior to Visit  Medication Sig Dispense Refill  . ALPRAZolam (XANAX) 0.25 MG tablet Take 0.25 mg by mouth daily as needed. Panic attack       . aspirin 325 MG tablet Take 325 mg by mouth daily.        . calcium carbonate (OS-CAL) 600 MG TABS Take 600 mg by mouth daily. Takes occ      . cefdinir (OMNICEF) 300 MG capsule Take 1 capsule (300 mg total) by mouth 2 (two) times daily.  20 capsule  0  . cetirizine (ZYRTEC) 10 MG tablet Take 10 mg by mouth daily as needed. Seasonal allergies       . co-enzyme Q-10 30 MG capsule Take 30 mg by mouth daily.       . enalapril (  VASOTEC) 20 MG tablet Take 1 tablet (20 mg total) by mouth daily.  30 tablet  2  . lovastatin (MEVACOR) 10 MG tablet 1/2 tab po qhs  30 tablet  5  . magnesium oxide (MAG-OX) 400 MG tablet Take 400 mg by mouth daily.        . metoprolol succinate (TOPROL-XL) 25 MG 24 hr tablet Takes 1 tab daily  30 tablet  1  . Multiple Vitamin (MULTIVITAMIN) tablet Take 1 tablet by mouth daily. Takes occ      . NAFTIN 1 % cream       . Omega-3 Fatty Acids (FISH OIL PO) Take 1 capsule by mouth daily.       . Probiotic Product (ALIGN) 4 MG CAPS Take 1 capsule by mouth daily.  30 capsule  0  . Sodium Chloride-Sodium Bicarb (RA SINUS WASH NETI POT NA) Place into the nose as needed. sinuses       . famotidine (PEPCID) 20 MG tablet Take 20 mg by mouth daily.       Marland Kitchen Ketotifen Fumarate (ALLERGY EYE DROPS OP) Place 2 drops into both eyes as needed. Red itchy eyes         Allergies  Allergen Reactions  . Atenolol      Caused fatigue   . Codeine Nausea And Vomiting  . Gabapentin     Memory loss and confusion  . Simvastatin     Her leg gave away on her and states thinks secondary to Simvastatin    Review of Systems  Review of Systems  Constitutional: Positive for chills and malaise/fatigue. Negative for fever.  HENT: Positive for congestion and sore throat. Negative for ear pain.   Eyes: Negative for discharge.  Respiratory: Negative for cough and shortness of breath.   Cardiovascular: Negative for chest pain, palpitations and leg swelling.  Gastrointestinal: Negative for nausea, abdominal pain and diarrhea.  Genitourinary: Negative for dysuria.  Musculoskeletal: Negative for falls.  Skin: Negative for rash.  Neurological: Positive for weakness and headaches. Negative for loss of consciousness.  Endo/Heme/Allergies: Negative for polydipsia.  Psychiatric/Behavioral: Negative for depression and suicidal ideas. The patient is nervous/anxious. The patient does not have insomnia.     Objective  BP 154/82  Pulse 65  Temp(Src) 97.9 F (36.6 C) (Oral)  Ht 5' 1.25" (1.556 m)  Wt 132 lb 12.8 oz (60.238 kg)  BMI 24.89 kg/m2  SpO2 99%  Physical Exam  Physical Exam  Constitutional: She is oriented to person, place, and time and well-developed, well-nourished, and in no distress. No distress.  HENT:  Head: Normocephalic and atraumatic.  Eyes: Conjunctivae are normal.  Neck: Neck supple. No thyromegaly present.  Cardiovascular: Normal rate, regular rhythm and normal heart sounds.   Pulmonary/Chest: Effort normal and breath sounds normal. She has no wheezes.  Abdominal: She exhibits no distension and no mass.  Musculoskeletal: She exhibits no edema.  Lymphadenopathy:    She has no cervical adenopathy.  Neurological: She is alert and oriented to person, place, and time.  Skin: Skin is warm and dry. No rash noted. She is not diaphoretic.  Psychiatric: Memory, affect and judgment normal.     Lab Results  Component Value Date   TSH 0.67 01/26/2011   Lab Results  Component Value Date   WBC 5.2 01/26/2011   HGB 14.7 01/26/2011   HCT 44.1 01/26/2011   MCV 94.8 01/26/2011   PLT 185 01/26/2011   Lab Results  Component Value Date   CREATININE 0.9  01/26/2011   BUN 9 01/26/2011   NA 137 01/26/2011   K 4.1 01/26/2011   CL 100 01/26/2011   CO2 28 01/26/2011   Lab Results  Component Value Date   ALT 16 12/22/2010   AST 13 12/22/2010   ALKPHOS 61 12/22/2010   BILITOT 0.6 12/22/2010   Lab Results  Component Value Date   CHOL 162 11/10/2010   Lab Results  Component Value Date   HDL 64.90 11/10/2010   Lab Results  Component Value Date   LDLCALC 84 11/10/2010   Lab Results  Component Value Date   TRIG 64.0 11/10/2010   Lab Results  Component Value Date   CHOLHDL 2 11/10/2010     Assessment & Plan  Acute sinusitis Doing much better with treatment of Cefdinir encouraged to finish whole course and continue yogurt, mucinex and increaesed fluids  Hypertension Improved control on repeat We will continue metoprolol succinate 25 mg daily at her request. She will report any concerning symptoms. She will continue her enalapril daily  Hyponatremia Resolved at this time  Hyperkalemia Resolved with decrease in Enalapril to once daily  Anxiety Much less anxious today as she feels better

## 2011-01-31 NOTE — Assessment & Plan Note (Signed)
Resolved at this time.  °

## 2011-01-31 NOTE — Assessment & Plan Note (Addendum)
Improved control on repeat We will continue metoprolol succinate 25 mg daily at her request. She will report any concerning symptoms. She will continue her enalapril daily

## 2011-01-31 NOTE — Assessment & Plan Note (Signed)
Much less anxious today as she feels better

## 2011-01-31 NOTE — Assessment & Plan Note (Signed)
Resolved with decrease in Enalapril to once daily

## 2011-01-31 NOTE — Assessment & Plan Note (Addendum)
Doing much better with treatment of Cefdinir encouraged to finish whole course and continue yogurt, mucinex and increaesed fluids

## 2011-01-31 NOTE — Patient Instructions (Signed)

## 2011-02-04 NOTE — Assessment & Plan Note (Signed)
Will repeat renal panel today, patient hase been watching her diet very closely

## 2011-02-04 NOTE — Assessment & Plan Note (Signed)
Did not like Bystolic, will use Metoprolol, avoid sodium

## 2011-02-04 NOTE — Progress Notes (Signed)
Jennifer Peck [id number] Oct 28, 1922 02/04/2011      Progress Note-Follow Up  Subjective  Chief Complaint  Chief Complaint  Patient presents with  . Hypertension    follow up with BP    HPI  Patient is a 77 yo female who is in today for follow up on her multiple medical concerns. She was not happy with Bystolic likes the Metoprolol better. Denies any HA., cp, palp, sob, gi or gu c/o  Past Medical History  Diagnosis Date  . Hypertension   . Anxiety   . Vertigo   . Coronary artery disease   . Hyperlipidemia   . BCC (basal cell carcinoma), face 11/06/2010  . SCC (squamous cell carcinoma), arm 11/06/2010  . Bilateral bunions 11/06/2010  . Peripheral neuropathy 11/06/2010  . Constipation 11/06/2010  . Hyponatremia 11/23/2010  . Osteopenia 11/23/2010  . Hyperkalemia 01/23/2011    Past Surgical History  Procedure Date  . Cardiac catheterization 12/20/2009    3 bare metal stents  . Tubal ligation     Family History  Problem Relation Age of Onset  . Stroke Mother   . Heart disease Father   . Hypertension Daughter   . Hyperlipidemia Daughter   . Hypertension Son   . Cancer Daughter     rectal/ chemo and radiation  . Hypertension Daughter   . Hypertension Son   . Hyperlipidemia Son   . Hypertension Son   . Vision loss Maternal Grandfather   . Stroke Paternal Grandmother   . Asthma Paternal Grandfather     History   Social History  . Marital Status: Single    Spouse Name: N/A    Number of Children: N/A  . Years of Education: N/A   Occupational History  . Not on file.   Social History Main Topics  . Smoking status: Former Smoker    Quit date: 09/04/1977  . Smokeless tobacco: Never Used  . Alcohol Use: Yes     only on Holidays  . Drug Use: No  . Sexually Active: Not on file   Other Topics Concern  . Not on file   Social History Narrative  . No narrative on file    Current Outpatient Prescriptions on File Prior to Visit  Medication Sig Dispense Refill    . ALPRAZolam (XANAX) 0.25 MG tablet Take 0.25 mg by mouth daily as needed. Panic attack       . aspirin 325 MG tablet Take 325 mg by mouth daily.        . calcium carbonate (OS-CAL) 600 MG TABS Take 600 mg by mouth daily. Takes occ      . cetirizine (ZYRTEC) 10 MG tablet Take 10 mg by mouth daily as needed. Seasonal allergies       . co-enzyme Q-10 30 MG capsule Take 30 mg by mouth daily.       . famotidine (PEPCID) 20 MG tablet Take 20 mg by mouth daily.       Marland Kitchen Ketotifen Fumarate (ALLERGY EYE DROPS OP) Place 2 drops into both eyes as needed. Red itchy eyes       . lovastatin (MEVACOR) 10 MG tablet 1/2 tab po qhs  30 tablet  5  . magnesium oxide (MAG-OX) 400 MG tablet Take 400 mg by mouth daily.        . Multiple Vitamin (MULTIVITAMIN) tablet Take 1 tablet by mouth daily. Takes occ      . NAFTIN 1 % cream       .  Omega-3 Fatty Acids (FISH OIL PO) Take 1 capsule by mouth daily.       . Sodium Chloride-Sodium Bicarb (RA SINUS WASH NETI POT NA) Place into the nose as needed. sinuses         Allergies  Allergen Reactions  . Atenolol     Caused fatigue   . Codeine Nausea And Vomiting  . Gabapentin     Memory loss and confusion  . Simvastatin     Her leg gave away on her and states thinks secondary to Simvastatin    Review of Systems  Review of Systems  Constitutional: Negative for fever and malaise/fatigue.  HENT: Negative for congestion.   Eyes: Negative for discharge.  Respiratory: Negative for shortness of breath.   Cardiovascular: Negative for chest pain, palpitations and leg swelling.  Gastrointestinal: Negative for nausea, abdominal pain and diarrhea.  Genitourinary: Negative for dysuria.  Musculoskeletal: Negative for falls.  Skin: Negative for rash.  Neurological: Negative for loss of consciousness and headaches.  Endo/Heme/Allergies: Negative for polydipsia.  Psychiatric/Behavioral: Negative for depression and suicidal ideas. The patient is not nervous/anxious and does  not have insomnia.     Objective  BP 150/86  Pulse 66  Temp(Src) 98 F (36.7 C) (Oral)  Ht 5' 1.25" (1.556 m)  Wt 131 lb 12.8 oz (59.784 kg)  BMI 24.70 kg/m2  SpO2 98%  Physical Exam  Physical Exam  Constitutional: She is oriented to person, place, and time and well-developed, well-nourished, and in no distress. No distress.  HENT:  Head: Normocephalic and atraumatic.  Eyes: Conjunctivae are normal.  Neck: Neck supple. No thyromegaly present.  Cardiovascular: Normal rate, regular rhythm and normal heart sounds.   No murmur heard. Pulmonary/Chest: Effort normal and breath sounds normal. She has no wheezes.  Abdominal: She exhibits no distension and no mass.  Musculoskeletal: She exhibits no edema.  Lymphadenopathy:    She has no cervical adenopathy.  Neurological: She is alert and oriented to person, place, and time.  Skin: Skin is warm and dry. No rash noted. She is not diaphoretic.  Psychiatric: Memory, affect and judgment normal.    Lab Results  Component Value Date   TSH 0.67 01/26/2011   Lab Results  Component Value Date   WBC 5.2 01/26/2011   HGB 14.7 01/26/2011   HCT 44.1 01/26/2011   MCV 94.8 01/26/2011   PLT 185 01/26/2011   Lab Results  Component Value Date   CREATININE 0.9 01/26/2011   BUN 9 01/26/2011   NA 137 01/26/2011   K 4.1 01/26/2011   CL 100 01/26/2011   CO2 28 01/26/2011   Lab Results  Component Value Date   ALT 16 12/22/2010   AST 13 12/22/2010   ALKPHOS 61 12/22/2010   BILITOT 0.6 12/22/2010   Lab Results  Component Value Date   CHOL 162 11/10/2010   Lab Results  Component Value Date   HDL 64.90 11/10/2010   Lab Results  Component Value Date   LDLCALC 84 11/10/2010   Lab Results  Component Value Date   TRIG 64.0 11/10/2010   Lab Results  Component Value Date   CHOLHDL 2 11/10/2010     Assessment & Plan  Hyperkalemia Will repeat renal panel today, patient hase been watching her diet very closely  Hypertension Did not like  Bystolic, will use Metoprolol, avoid sodium

## 2011-02-22 ENCOUNTER — Encounter: Payer: Self-pay | Admitting: Cardiology

## 2011-02-22 ENCOUNTER — Ambulatory Visit (INDEPENDENT_AMBULATORY_CARE_PROVIDER_SITE_OTHER): Payer: Medicare Other | Admitting: Cardiology

## 2011-02-22 VITALS — BP 122/78 | HR 80 | Ht 62.0 in | Wt 135.0 lb

## 2011-02-22 DIAGNOSIS — I10 Essential (primary) hypertension: Secondary | ICD-10-CM

## 2011-02-22 DIAGNOSIS — I491 Atrial premature depolarization: Secondary | ICD-10-CM

## 2011-02-22 DIAGNOSIS — E7800 Pure hypercholesterolemia, unspecified: Secondary | ICD-10-CM

## 2011-02-22 DIAGNOSIS — I259 Chronic ischemic heart disease, unspecified: Secondary | ICD-10-CM

## 2011-02-22 DIAGNOSIS — I119 Hypertensive heart disease without heart failure: Secondary | ICD-10-CM

## 2011-02-22 LAB — BASIC METABOLIC PANEL
BUN: 12 mg/dL (ref 6–23)
CO2: 28 mEq/L (ref 19–32)
Calcium: 9.8 mg/dL (ref 8.4–10.5)
Chloride: 99 mEq/L (ref 96–112)
Creatinine, Ser: 0.7 mg/dL (ref 0.4–1.2)
Glucose, Bld: 98 mg/dL (ref 70–99)

## 2011-02-22 LAB — HEPATIC FUNCTION PANEL
AST: 13 U/L (ref 0–37)
Alkaline Phosphatase: 55 U/L (ref 39–117)
Bilirubin, Direct: 0.1 mg/dL (ref 0.0–0.3)
Total Bilirubin: 0.8 mg/dL (ref 0.3–1.2)

## 2011-02-22 LAB — LIPID PANEL
Total CHOL/HDL Ratio: 4
VLDL: 17.4 mg/dL (ref 0.0–40.0)

## 2011-02-22 LAB — LDL CHOLESTEROL, DIRECT: Direct LDL: 171.1 mg/dL

## 2011-02-22 MED ORDER — METOPROLOL SUCCINATE ER 25 MG PO TB24: ORAL_TABLET | ORAL | Status: DC

## 2011-02-22 NOTE — Assessment & Plan Note (Signed)
Patient has a history of high blood pressure.  Recently her primary care physician increased her enalapril to twice a day but essentially had to be reduced because of worsening hyperkalemia.

## 2011-02-22 NOTE — Patient Instructions (Signed)
Will obtain labs today and call you with the results (lp/bmet/hfp) Your physician recommends that you continue on your current medications as directed. Please refer to the Current Medication list given to you today. Your physician wants you to follow-up in: 6 months You will receive a reminder letter in the mail two months in advance. If you don't receive a letter, please call our office to schedule the follow-up appointment.

## 2011-02-22 NOTE — Assessment & Plan Note (Signed)
The patient is having premature atrial beats again today on auscultation.  These are asymptomatic and require no specific treatment.

## 2011-02-22 NOTE — Assessment & Plan Note (Signed)
The patient has a history of hypercholesterolemia.  Unfortunately she was not able to tolerate lovastatin which also cause leg discomfort as did simvastatin previously.  Her blood work obtained today reflects no statin therapy.  We may need to consider Zetia depending on results of today's labs

## 2011-02-22 NOTE — Progress Notes (Signed)
Jennifer Peck Date of Birth:  1922-12-25 [facility] [address]   [address] [place], [place]  [address] [phone]           Fax   [phone]  History of Present Illness: This pleasant very alert 77 year old woman is seen for a scheduled followup office visit.  She has a history of known coronary artery disease.  She had 3 bare metal stents placed on 12/20/09 into her mid left circumflex coronary artery, the second obtuse marginal vessel, and the first obtuse marginal vessel and into the mid right coronary artery.  Since then she has done well with no subsequent angina.  She finished a 12 week cardiac rehabilitation program and she is now participating in a "fit and strong exercise program at the  recreation center 3 days a week.  She has a history of hypercholesterolemia.  Current Outpatient Prescriptions  Medication Sig Dispense Refill  . ALPRAZolam (XANAX) 0.25 MG tablet Take 0.25 mg by mouth daily as needed. Panic attack       . aspirin 325 MG tablet Take 325 mg by mouth daily.        . calcium carbonate (OS-CAL) 600 MG TABS Take 600 mg by mouth daily. Takes occ      . Calcium Carbonate-Vitamin D (CALCIUM 500 + D PO) Take by mouth daily.        Marland Kitchen co-enzyme Q-10 30 MG capsule Take 30 mg by mouth daily.       . enalapril (VASOTEC) 20 MG tablet Take 1 tablet (20 mg total) by mouth daily.  30 tablet  2  . Ketotifen Fumarate (ALLERGY EYE DROPS OP) Place 2 drops into both eyes as needed. Red itchy eyes       . magnesium oxide (MAG-OX) 400 MG tablet Take 400 mg by mouth daily.        . metoprolol succinate (TOPROL-XL) 25 MG 24 hr tablet Takes 1 tab daily  90 tablet  3  . Multiple Vitamin (MULTIVITAMIN) tablet Take 1 tablet by mouth daily. Takes occ      . NAFTIN 1 % cream       . Omega-3 Fatty Acids (FISH OIL PO) Take 1 capsule by mouth daily.       . Sodium Chloride-Sodium Bicarb (RA SINUS WASH NETI POT NA) Place into the nose as needed. sinuses          Allergies  Allergen Reactions  . Atenolol     Caused fatigue   . Codeine Nausea And Vomiting  . Gabapentin     Memory loss and confusion  . Lovastatin     myalgia  . Simvastatin     Her leg gave away on her and states thinks secondary to Simvastatin    Patient Active Problem List  Diagnoses  . Hypertension  . Anxiety  . Vertigo  . PAC (premature atrial contraction)  . Encounter for long-term (current) use of other medications  . Pure hypercholesterolemia  . Ischemic heart disease  . BCC (basal cell carcinoma), face  . SCC (squamous cell carcinoma), arm  . Bilateral bunions  . Peripheral neuropathy  . Constipation  . Left leg pain  . Hyponatremia  . Osteopenia  . Hyperkalemia  . Acute sinusitis    History  Smoking status  . Former Smoker  . Quit date: 09/04/1977  Smokeless tobacco  . Never Used    History  Alcohol Use  . Yes    only on  Holidays    Family History  Problem Relation Age of Onset  . Stroke Mother   . Heart disease Father   . Hypertension Daughter   . Hyperlipidemia Daughter   . Hypertension Son   . Cancer Daughter     rectal/ chemo and radiation  . Hypertension Daughter   . Hypertension Son   . Hyperlipidemia Son   . Hypertension Son   . Vision loss Maternal Grandfather   . Stroke Paternal Grandmother   . Asthma Paternal Grandfather     Review of Systems: Constitutional: no fever chills diaphoresis or fatigue or change in weight.  Head and neck: no hearing loss, no epistaxis, no photophobia or visual disturbance. Respiratory: No cough, shortness of breath or wheezing. Cardiovascular: No chest pain peripheral edema, palpitations. Gastrointestinal: No abdominal distention, no abdominal pain, no change in bowel habits hematochezia or melena. Genitourinary: No dysuria, no frequency, no urgency, no nocturia. Musculoskeletal:No arthralgias, no back pain, no gait disturbance or myalgias. Neurological: No dizziness, no headaches, no  numbness, no seizures, no syncope, no weakness, no tremors. Hematologic: No lymphadenopathy, no easy bruising. Psychiatric: No confusion, no hallucinations, no sleep disturbance.    Physical Exam: Filed Vitals:   02/22/11 1027  BP: 122/78  Pulse: 80   the general appearance reveals a well-developed well-nourished elderly woman who is very alert and spry.The head and neck exam reveals pupils equal and reactive.  Extraocular movements are full.  There is no scleral icterus.  The mouth and pharynx are normal.  The neck is supple.  The carotids reveal no bruits.  The jugular venous pressure is normal.  The  thyroid is not enlarged.  There is no lymphadenopathy.  The chest is clear to percussion and auscultation.  There are no rales or rhonchi.  Expansion of the chest is symmetrical.  The precordium is quiet.  The first heart sound is normal.  The second heart sound is physiologically split.  There is no murmur gallop rub or click.  There is no abnormal lift or heave.  The abdomen is soft and nontender.  The bowel sounds are normal.  The liver and spleen are not enlarged.  There are no abdominal masses.  There are no abdominal bruits.  Extremities reveal good pedal pulses.  There is no phlebitis or edema.  There is no cyanosis or clubbing.  Strength is normal and symmetrical in all extremities.  There is no lateralizing weakness.  There are no sensory deficits.  The skin is warm and dry.  There is no rash.     Assessment / Plan: Continue on present medication.  Refilled her Toprol today.  She will return in 6 months for followup office visit EKG and fasting lab work.  If we wind up putting her on something like Zetia we will have her come back sooner for followup lab work.

## 2011-02-22 NOTE — Assessment & Plan Note (Signed)
Patient has not been experiencing any exertional chest tightness to suggest recurrent angina.

## 2011-03-01 ENCOUNTER — Telehealth: Payer: Self-pay | Admitting: *Deleted

## 2011-03-01 DIAGNOSIS — E7800 Pure hypercholesterolemia, unspecified: Secondary | ICD-10-CM

## 2011-03-01 MED ORDER — ROSUVASTATIN CALCIUM 5 MG PO TABS: 5.0000 mg | ORAL_TABLET | Freq: Every day | ORAL | Status: DC

## 2011-03-01 NOTE — Telephone Encounter (Signed)
Message copied by Burnell Blanks on Thu Mar 01, 2011  2:50 PM ------      Message from: Cassell Clement      Created: Mon Feb 26, 2011 10:39 AM       Please report.  The cholesterol is too high and the LDL is also very high at 171.  We need to make a better effort at getting the LDL cholesterol closer to 161.  Stay on fish oil capsules and try adding low-dose Crestor 5 mg one daily.  Recheck a lipid panel and hepatic function panel in about 2 months.

## 2011-03-01 NOTE — Telephone Encounter (Signed)
Advised of labs, sent Rx to pharmacy, and scheduled labs

## 2011-03-05 ENCOUNTER — Telehealth: Payer: Self-pay | Admitting: Cardiology

## 2011-03-05 DIAGNOSIS — E785 Hyperlipidemia, unspecified: Secondary | ICD-10-CM

## 2011-03-05 MED ORDER — EZETIMIBE 10 MG PO TABS: 10.0000 mg | ORAL_TABLET | Freq: Every day | ORAL | Status: DC

## 2011-03-05 NOTE — Telephone Encounter (Signed)
Patient called and is concerned about the Crestor and intolernce to statins.  States Zetia discussed at office visit.  Please advise

## 2011-03-05 NOTE — Telephone Encounter (Signed)
New msg Pt called and said she is allergic to statin drugs Please call her back about crestor that was called in

## 2011-03-05 NOTE — Telephone Encounter (Signed)
Yes hold off on the Crestor and try Zetia 10 mg daily

## 2011-03-05 NOTE — Telephone Encounter (Signed)
Advised patient and sent Rx to pharmacy. 

## 2011-03-21 ENCOUNTER — Ambulatory Visit: Payer: Medicare Other | Admitting: Family Medicine

## 2011-04-25 ENCOUNTER — Other Ambulatory Visit: Payer: Medicare Other | Admitting: *Deleted

## 2011-04-30 ENCOUNTER — Encounter: Payer: Self-pay | Admitting: Family Medicine

## 2011-04-30 ENCOUNTER — Ambulatory Visit (INDEPENDENT_AMBULATORY_CARE_PROVIDER_SITE_OTHER): Payer: Medicare Other | Admitting: Family Medicine

## 2011-04-30 DIAGNOSIS — K5900 Constipation, unspecified: Secondary | ICD-10-CM

## 2011-04-30 DIAGNOSIS — E875 Hyperkalemia: Secondary | ICD-10-CM

## 2011-04-30 DIAGNOSIS — T7840XA Allergy, unspecified, initial encounter: Secondary | ICD-10-CM

## 2011-04-30 DIAGNOSIS — L821 Other seborrheic keratosis: Secondary | ICD-10-CM

## 2011-04-30 DIAGNOSIS — E785 Hyperlipidemia, unspecified: Secondary | ICD-10-CM

## 2011-04-30 DIAGNOSIS — I10 Essential (primary) hypertension: Secondary | ICD-10-CM

## 2011-04-30 HISTORY — DX: Allergy, unspecified, initial encounter: T78.40XA

## 2011-04-30 NOTE — Assessment & Plan Note (Signed)
Elevated today but she did not take her Enalapril this am as prescribed. She agrees to take it prior to her next visit so we can assess it's efficacy

## 2011-04-30 NOTE — Assessment & Plan Note (Signed)
Is tolerating Zetia, patient agrees to retun here in 1 months time to have her labs repeated

## 2011-04-30 NOTE — Assessment & Plan Note (Signed)
Check a renal panel 

## 2011-04-30 NOTE — Assessment & Plan Note (Signed)
Does well most days but does use Miralax intermittently with good results as needed, may continue the same

## 2011-04-30 NOTE — Progress Notes (Signed)
Patient ID: Jennifer Peck, female   DOB: 09-08-1922, 78 y.o.   MRN: [id number] Jennifer Peck [id number] Mar 21, 1922 04/30/2011      Progress Note New Patient  Subjective  Chief Complaint  Chief Complaint  Patient presents with  . Annual Exam    physical and labs (fasting)- NO pap    HPI  Patient is an 78 year old Caucasian female who is in today for physical exam. She is overall doing well. Declines Pap smear but is not really a difficult but she in the past. Has been checking her blood pressure elsewhere and 20 systolic to 108-138. Diastolics in the 60s and 70s and pulse is generally in 6 months. Overall she says she feels well. Occasionally has a flare with upper hemorrhoids her bowels slowed down but uses MiraLax when necessary with that. Continues to shovel congestion. Nasal congestion worse at night. She did not tolerate lovastatin and had increased muscle pain she is known that he is tolerating that. She's previously seen in dermatology was nothing there now. Somewhat itchy and scaly lesion on her back and arm bilateral calf area. No chest pain, palpitations, shortness of breath, GI or GU complaints noted today. Has had occasional epistaxis and sneezing  Past Medical History  Diagnosis Date  . Hypertension   . Anxiety   . Vertigo   . Coronary artery disease   . Hyperlipidemia   . BCC (basal cell carcinoma), face 11/06/2010  . SCC (squamous cell carcinoma), arm 11/06/2010  . Bilateral bunions 11/06/2010  . Peripheral neuropathy 11/06/2010  . Constipation 11/06/2010  . Hyponatremia 11/23/2010  . Osteopenia 11/23/2010  . Hyperkalemia 01/23/2011    Past Surgical History  Procedure Date  . Cardiac catheterization 12/20/2009    3 bare metal stents  . Tubal ligation     Family History  Problem Relation Age of Onset  . Stroke Mother   . Heart disease Father   . Hypertension Daughter   . Hyperlipidemia Daughter   . Hypertension Son   . Cancer Daughter     rectal/ chemo and  radiation  . Hypertension Daughter   . Hypertension Son   . Hyperlipidemia Son   . Hypertension Son   . Vision loss Maternal Grandfather   . Stroke Paternal Grandmother   . Asthma Paternal Grandfather     History   Social History  . Marital Status: Single    Spouse Name: N/A    Number of Children: N/A  . Years of Education: N/A   Occupational History  . Not on file.   Social History Main Topics  . Smoking status: Former Smoker    Quit date: 09/04/1977  . Smokeless tobacco: Never Used  . Alcohol Use: Yes     only on Holidays  . Drug Use: No  . Sexually Active: Not on file   Other Topics Concern  . Not on file   Social History Narrative  . No narrative on file    Current Outpatient Prescriptions on File Prior to Visit  Medication Sig Dispense Refill  . ALPRAZolam (XANAX) 0.25 MG tablet Take 0.25 mg by mouth daily as needed. Panic attack       . aspirin 325 MG tablet Take 325 mg by mouth daily.        . calcium carbonate (OS-CAL) 600 MG TABS Take 600 mg by mouth daily. Takes occ      . Calcium Carbonate-Vitamin D (CALCIUM 500 + D PO) Take by mouth daily.        Marland Kitchen  co-enzyme Q-10 30 MG capsule Take 30 mg by mouth daily.       . enalapril (VASOTEC) 20 MG tablet Take 1 tablet (20 mg total) by mouth daily.  30 tablet  2  . ezetimibe (ZETIA) 10 MG tablet Take 1 tablet (10 mg total) by mouth daily.  30 tablet  11  . Ketotifen Fumarate (ALLERGY EYE DROPS OP) Place 2 drops into both eyes as needed. Red itchy eyes       . magnesium oxide (MAG-OX) 400 MG tablet Take 400 mg by mouth daily.        . metoprolol succinate (TOPROL-XL) 25 MG 24 hr tablet Takes 1 tab daily  90 tablet  3  . Multiple Vitamin (MULTIVITAMIN) tablet Take 1 tablet by mouth daily. Takes occ      . NAFTIN 1 % cream       . Omega-3 Fatty Acids (FISH OIL PO) Take 1 capsule by mouth daily.       . Sodium Chloride-Sodium Bicarb (RA SINUS WASH NETI POT NA) Place into the nose as needed. sinuses         Allergies   Allergen Reactions  . Atenolol     Caused fatigue   . Codeine Nausea And Vomiting  . Gabapentin     Memory loss and confusion  . Lovastatin     myalgia  . Simvastatin     Her leg gave away on her and states thinks secondary to Simvastatin    Review of Systems  Review of Systems  Constitutional: Negative for fever, chills and malaise/fatigue.  HENT: Positive for congestion. Negative for hearing loss and nosebleeds.   Eyes: Negative for discharge.  Respiratory: Negative for cough, sputum production, shortness of breath and wheezing.   Cardiovascular: Negative for chest pain, palpitations and leg swelling.  Gastrointestinal: Negative for heartburn, nausea, vomiting, abdominal pain, diarrhea, constipation and blood in stool.  Genitourinary: Negative for dysuria, urgency, frequency and hematuria.  Musculoskeletal: Negative for myalgias, back pain and falls.  Skin: Negative for rash.  Neurological: Negative for dizziness, tremors, sensory change, focal weakness, loss of consciousness, weakness and headaches.  Endo/Heme/Allergies: Negative for polydipsia. Does not bruise/bleed easily.  Psychiatric/Behavioral: Negative for depression and suicidal ideas. The patient is not nervous/anxious and does not have insomnia.     Objective  BP 179/83  Pulse 63  Temp(Src) 98.8 F (37.1 C) (Temporal)  Ht 5' 1.25" (1.556 m)  Wt 134 lb 12.8 oz (61.145 kg)  BMI 25.26 kg/m2  SpO2 98%  Physical Exam  Physical Exam  Constitutional: She is oriented to person, place, and time and well-developed, well-nourished, and in no distress. No distress.  HENT:  Head: Normocephalic and atraumatic.  Right Ear: External ear normal.  Left Ear: External ear normal.  Nose: Nose normal.  Mouth/Throat: Oropharynx is clear and moist. No oropharyngeal exudate.  Eyes: Conjunctivae are normal. Pupils are equal, round, and reactive to light. Right eye exhibits no discharge. Left eye exhibits no discharge. No scleral  icterus.  Neck: Normal range of motion. Neck supple. No thyromegaly present.  Cardiovascular: Normal rate, regular rhythm, normal heart sounds and intact distal pulses.   No murmur heard. Pulmonary/Chest: Effort normal and breath sounds normal. No respiratory distress. She has no wheezes. She has no rales.  Abdominal: Soft. Bowel sounds are normal. She exhibits no distension and no mass. There is no tenderness.  Musculoskeletal: Normal range of motion. She exhibits no edema and no tenderness.  Lymphadenopathy:  She has no cervical adenopathy.  Neurological: She is alert and oriented to person, place, and time. She has normal reflexes. No cranial nerve deficit. Coordination normal.  Skin: Skin is warm and dry. No rash noted. She is not diaphoretic.     Psychiatric: Mood, memory and affect normal.       Assessment & Plan    Hypertension Elevated today but she did not take her Enalapril this am as prescribed. She agrees to take it prior to her next visit so we can assess it's efficacy  Hyperlipidemia Is tolerating Zetia, patient agrees to retun here in 1 months time to have her labs repeated  Seborrheic keratosis 2 on back and 1 on the back of each calf, notify us if any change quickly or bleed. Has seen dermatology in past but does not want to go back unless something changes  Constipation Does well most days but does use Miralax intermittently with good results as needed, may continue the same  Hyperkalemia Check a renal panel

## 2011-04-30 NOTE — Patient Instructions (Signed)
Allergies, Generic Allergies may happen from anything your body is sensitive to. This may be food, medicines, pollens, chemicals, and nearly anything around you in everyday life that produces allergens. An allergen is anything that causes an allergy producing substance. Heredity is often a factor in causing these problems. This means you may have some of the same allergies as your parents. Food allergies happen in all age groups. Food allergies are some of the most severe and life threatening. Some common food allergies are cow's milk, seafood, eggs, nuts, wheat, and soybeans. SYMPTOMS   Swelling around the mouth.   An itchy red rash or hives.   Vomiting or diarrhea.   Difficulty breathing.  SEVERE ALLERGIC REACTIONS ARE LIFE-THREATENING. This reaction is called anaphylaxis. It can cause the mouth and throat to swell and cause difficulty with breathing and swallowing. In severe reactions only a trace amount of food (for example, peanut oil in a salad) may cause death within seconds. Seasonal allergies occur in all age groups. These are seasonal because they usually occur during the same season every year. They may be a reaction to molds, grass pollens, or tree pollens. Other causes of problems are house dust mite allergens, pet dander, and mold spores. The symptoms often consist of nasal congestion, a runny itchy nose associated with sneezing, and tearing itchy eyes. There is often an associated itching of the mouth and ears. The problems happen when you come in contact with pollens and other allergens. Allergens are the particles in the air that the body reacts to with an allergic reaction. This causes you to release allergic antibodies. Through a chain of events, these eventually cause you to release histamine into the blood stream. Although it is meant to be protective to the body, it is this release that causes your discomfort. This is why you were given anti-histamines to feel better. If you are  unable to pinpoint the offending allergen, it may be determined by skin or blood testing. Allergies cannot be cured but can be controlled with medicine. Hay fever is a collection of all or some of the seasonal allergy problems. It may often be treated with simple over-the-counter medicine such as diphenhydramine. Take medicine as directed. Do not drink alcohol or drive while taking this medicine. Check with your caregiver or package insert for child dosages. If these medicines are not effective, there are many new medicines your caregiver can prescribe. Stronger medicine such as nasal spray, eye drops, and corticosteroids may be used if the first things you try do not work well. Other treatments such as immunotherapy or desensitizing injections can be used if all else fails. Follow up with your caregiver if problems continue. These seasonal allergies are usually not life threatening. They are generally more of a nuisance that can often be handled using medicine. HOME CARE INSTRUCTIONS   If unsure what causes a reaction, keep a diary of foods eaten and symptoms that follow. Avoid foods that cause reactions.   If hives or rash are present:   Take medicine as directed.   You may use an over-the-counter antihistamine (diphenhydramine) for hives and itching as needed.   Apply cold compresses (cloths) to the skin or take baths in cool water. Avoid hot baths or showers. Heat will make a rash and itching worse.   If you are severely allergic:   Following a treatment for a severe reaction, hospitalization is often required for closer follow-up.   Wear a medic-alert bracelet or necklace stating the allergy.     You and your family must learn how to give adrenaline or use an anaphylaxis kit.   If you have had a severe reaction, always carry your anaphylaxis kit or EpiPen with you. Use this medicine as directed by your caregiver if a severe reaction is occurring. Failure to do so could have a fatal  outcome.  SEEK MEDICAL CARE IF:  You suspect a food allergy. Symptoms generally happen within 30 minutes of eating a food.   Your symptoms have not gone away within 2 days or are getting worse.   You develop new symptoms.   You want to retest yourself or your child with a food or drink you think causes an allergic reaction. Never do this if an anaphylactic reaction to that food or drink has happened before. Only do this under the care of a caregiver.  SEEK IMMEDIATE MEDICAL CARE IF:   You have difficulty breathing, are wheezing, or have a tight feeling in your chest or throat.   You have a swollen mouth, or you have hives, swelling, or itching all over your body.   You have had a severe reaction that has responded to your anaphylaxis kit or an EpiPen. These reactions may return when the medicine has worn off. These reactions should be considered life threatening.  MAKE SURE YOU:   Understand these instructions.   Will watch your condition.   Will get help right away if you are not doing well or get worse.  Document Released: 05/29/2002 Document Revised: 11/15/2010 Document Reviewed: 11/03/2007 [facility] Patient Information 2012 [place], [place].   Try Zyrtec/Cetirizine 10 mg tab 1 in am every day and can still use Benadryl at night as needed

## 2011-04-30 NOTE — Assessment & Plan Note (Signed)
2 on back and 1 on the back of each calf, notify us if any change quickly or bleed. Has seen dermatology in past but does not want to go back unless something changes

## 2011-05-21 ENCOUNTER — Other Ambulatory Visit (INDEPENDENT_AMBULATORY_CARE_PROVIDER_SITE_OTHER): Payer: Medicare Other

## 2011-05-21 DIAGNOSIS — I10 Essential (primary) hypertension: Secondary | ICD-10-CM

## 2011-05-21 DIAGNOSIS — E785 Hyperlipidemia, unspecified: Secondary | ICD-10-CM

## 2011-05-21 LAB — CBC
MCV: 96.6 fl (ref 78.0–100.0)
RBC: 4.49 Mil/uL (ref 3.87–5.11)
RDW: 14 % (ref 11.5–14.6)
WBC: 5.1 10*3/uL (ref 4.5–10.5)

## 2011-05-21 LAB — RENAL FUNCTION PANEL
Chloride: 102 mEq/L (ref 96–112)
GFR: 93.4 mL/min (ref >60.00)
Glucose, Bld: 80 mg/dL (ref 70–99)
Phosphorus: 3.7 mg/dL (ref 2.3–4.6)
Potassium: 4.8 mEq/L (ref 3.5–5.1)
Sodium: 137 mEq/L (ref 135–145)

## 2011-05-21 LAB — HEPATIC FUNCTION PANEL
AST: 14 U/L (ref 0–37)
Alkaline Phosphatase: 54 U/L (ref 39–117)
Bilirubin, Direct: 0.1 mg/dL (ref 0.0–0.3)
Total Bilirubin: 0.6 mg/dL (ref 0.3–1.2)

## 2011-05-21 LAB — LIPID PANEL
Total CHOL/HDL Ratio: 3
VLDL: 17.8 mg/dL (ref 0.0–40.0)

## 2011-05-21 LAB — LDL CHOLESTEROL, DIRECT: Direct LDL: 137.8 mg/dL

## 2011-05-28 ENCOUNTER — Encounter: Payer: Self-pay | Admitting: Family Medicine

## 2011-05-28 ENCOUNTER — Ambulatory Visit (INDEPENDENT_AMBULATORY_CARE_PROVIDER_SITE_OTHER): Payer: Medicare Other | Admitting: Family Medicine

## 2011-05-28 VITALS — BP 148/78 | HR 64 | Temp 98.8°F | Ht 61.25 in | Wt 138.8 lb

## 2011-05-28 DIAGNOSIS — E7800 Pure hypercholesterolemia, unspecified: Secondary | ICD-10-CM

## 2011-05-28 DIAGNOSIS — I10 Essential (primary) hypertension: Secondary | ICD-10-CM

## 2011-05-28 DIAGNOSIS — I259 Chronic ischemic heart disease, unspecified: Secondary | ICD-10-CM

## 2011-05-28 MED ORDER — COLESEVELAM HCL 3.75 G PO PACK: 3.7500 g | PACK | Freq: Every day | ORAL | Status: DC

## 2011-05-28 NOTE — Patient Instructions (Signed)
Cholesterol Cholesterol is a white, waxy, fat-like protein needed by your body in small amounts. The liver makes all the cholesterol you need. It is carried from the liver by the blood through the blood vessels. Deposits (plaque) may build up on blood vessel walls. This makes the arteries narrower and stiffer. Plaque increases the risk for heart attack and stroke. You cannot feel your cholesterol level even if it is very high. The only way to know is by a blood test to check your lipid (fats) levels. Once you know your cholesterol levels, you should keep a record of the test results. Work with your caregiver to to keep your levels in the desired range. WHAT THE RESULTS MEAN:  Total cholesterol is a rough measure of all the cholesterol in your blood.   LDL is the so-called bad cholesterol. This is the type that deposits cholesterol in the walls of the arteries. You want this level to be low.   HDL is the good cholesterol because it cleans the arteries and carries the LDL away. You want this level to be high.   Triglycerides are fat that the body can either burn for energy or store. High levels are closely linked to heart disease.  DESIRED LEVELS:  Total cholesterol below 200.   LDL below 100 for people at risk, below 70 for very high risk.   HDL above 50 is good, above 60 is best.   Triglycerides below 150.  HOW TO LOWER YOUR CHOLESTEROL:  Diet.   Choose fish or white meat chicken and [place], roasted or baked. Limit fatty cuts of red meat, fried foods, and processed meats, such as sausage and lunch meat.   Eat lots of fresh fruits and vegetables. Choose whole grains, beans, pasta, potatoes and cereals.   Use only small amounts of olive, corn or canola oils. Avoid butter, mayonnaise, shortening or palm kernel oils. Avoid foods with trans-fats.   Use skim/nonfat milk and low-fat/nonfat yogurt and cheeses. Avoid whole milk, cream, ice cream, egg yolks and cheeses. Healthy desserts include  angel food cake, ginger snaps, animal crackers, hard candy, popsicles, and low-fat/nonfat frozen yogurt. Avoid pastries, cakes, pies and cookies.   Exercise.   A regular program helps decrease LDL and raises HDL.   Helps with weight control.   Do things that increase your activity level like gardening, walking, or taking the stairs.   Medication.   May be prescribed by your caregiver to help lowering cholesterol and the risk for heart disease.   You may need medicine even if your levels are normal if you have several risk factors.  HOME CARE INSTRUCTIONS   Follow your diet and exercise programs as suggested by your caregiver.   Take medications as directed.   Have blood work done when your caregiver feels it is necessary.  MAKE SURE YOU:   Understand these instructions.   Will watch your condition.   Will get help right away if you are not doing well or get worse.  Document Released: 11/28/2000 Document Revised: 02/22/2011 Document Reviewed: 05/21/2007 [facility] Patient Information 2012 [place], [place].   Try the Welchol powder 1 packet mixed in 4-8 oz of watwer or orange juice  Or Welchol tabs 3 tabs twice daily, letus know which one you like so we can send it to the pharmacy

## 2011-05-28 NOTE — Assessment & Plan Note (Signed)
Asymptomatic at this time 

## 2011-05-28 NOTE — Progress Notes (Signed)
Patient ID: Jennifer Peck, female   DOB: 26-Feb-1923, 78 y.o.   MRN: [id number] Jennifer Peck [id number] 1923-01-25 05/28/2011      Progress Note-Follow Up  Subjective  Chief Complaint  Chief Complaint  Patient presents with  . Follow-up    1 month follow up on labs    HPI  Patient is an 78 year old Caucasian female who is in today for routine followup. Overall she reports feeling well. She continues to exercise routinely to warrant. She tolerated the reinstitution of the area. Denies any myalgias as a result. Is taking co-Q10 as well. Has not had any recent illness, fevers, chills, chest pain, palpitations, shortness of breath, GI or GU complaints last seen.  Past Medical History  Diagnosis Date  . Hypertension   . Anxiety   . Vertigo   . Coronary artery disease   . Hyperlipidemia   . BCC (basal cell carcinoma), face 11/06/2010  . SCC (squamous cell carcinoma), arm 11/06/2010  . Bilateral bunions 11/06/2010  . Peripheral neuropathy 11/06/2010  . Constipation 11/06/2010  . Hyponatremia 11/23/2010  . Osteopenia 11/23/2010  . Hyperkalemia 01/23/2011  . Hyperlipidemia 04/30/2011  . Allergic state 04/30/2011  . Seborrheic keratosis 04/30/2011    Past Surgical History  Procedure Date  . Cardiac catheterization 12/20/2009    3 bare metal stents  . Tubal ligation     Family History  Problem Relation Age of Onset  . Stroke Mother   . Heart disease Father   . Hypertension Daughter   . Hyperlipidemia Daughter   . Hypertension Son   . Cancer Daughter     rectal/ chemo and radiation  . Hypertension Daughter   . Hypertension Son   . Hyperlipidemia Son   . Hypertension Son   . Vision loss Maternal Grandfather   . Stroke Paternal Grandmother   . Asthma Paternal Grandfather     History   Social History  . Marital Status: Single    Spouse Name: N/A    Number of Children: N/A  . Years of Education: N/A   Occupational History  . Not on file.   Social History Main Topics  .  Smoking status: Former Smoker    Quit date: 09/04/1977  . Smokeless tobacco: Never Used  . Alcohol Use: Yes     only on Holidays  . Drug Use: No  . Sexually Active: Not on file   Other Topics Concern  . Not on file   Social History Narrative  . No narrative on file    Current Outpatient Prescriptions on File Prior to Visit  Medication Sig Dispense Refill  . ALPRAZolam (XANAX) 0.25 MG tablet Take 0.25 mg by mouth daily as needed. Panic attack       . aspirin 325 MG tablet Take 325 mg by mouth daily.        . calcium carbonate (OS-CAL) 600 MG TABS Take 600 mg by mouth daily. Takes occ      . Calcium Carbonate-Vitamin D (CALCIUM 500 + D PO) Take by mouth daily.        Marland Kitchen co-enzyme Q-10 30 MG capsule Take 30 mg by mouth daily.       . enalapril (VASOTEC) 20 MG tablet Take 1 tablet (20 mg total) by mouth daily.  30 tablet  2  . ezetimibe (ZETIA) 10 MG tablet Take 1 tablet (10 mg total) by mouth daily.  30 tablet  11  . Ketotifen Fumarate (ALLERGY EYE DROPS OP) Place 2 drops into  both eyes as needed. Red itchy eyes       . magnesium oxide (MAG-OX) 400 MG tablet Take 400 mg by mouth daily.        . metoprolol succinate (TOPROL-XL) 25 MG 24 hr tablet Takes 1 tab daily  90 tablet  3  . Multiple Vitamin (MULTIVITAMIN) tablet Take 1 tablet by mouth daily. Takes occ      . NAFTIN 1 % cream       . Omega-3 Fatty Acids (FISH OIL PO) Take 1 capsule by mouth daily.       Marland Kitchen OVER THE COUNTER MEDICATION Sinus buster- daily prn      . OVER THE COUNTER MEDICATION Eye drops 2 drops in each eye daily prn      . sodium chloride (OCEAN) 0.65 % nasal spray Place 2 sprays into the nose as needed.      . Sodium Chloride-Sodium Bicarb (RA SINUS WASH NETI POT NA) Place into the nose as needed. sinuses         Allergies  Allergen Reactions  . Atenolol     Caused fatigue   . Codeine Nausea And Vomiting  . Gabapentin     Memory loss and confusion  . Lovastatin     myalgia  . Simvastatin     Her leg gave  away on her and states thinks secondary to Simvastatin    Review of Systems  Review of Systems  Constitutional: Negative for fever and malaise/fatigue.  HENT: Negative for congestion.   Eyes: Negative for discharge.  Respiratory: Negative for shortness of breath.   Cardiovascular: Negative for chest pain, palpitations and leg swelling.  Gastrointestinal: Negative for nausea, abdominal pain and diarrhea.  Genitourinary: Negative for dysuria.  Musculoskeletal: Negative for myalgias and falls.  Skin: Negative for rash.  Neurological: Negative for dizziness, loss of consciousness and headaches.  Endo/Heme/Allergies: Negative for polydipsia.  Psychiatric/Behavioral: Negative for depression and suicidal ideas. The patient is not nervous/anxious and does not have insomnia.     Objective  BP 148/78  Pulse 64  Temp(Src) 98.8 F (37.1 C) (Temporal)  Ht 5' 1.25" (1.556 m)  Wt 138 lb 12.8 oz (62.959 kg)  BMI 26.01 kg/m2  SpO2 96%  Physical Exam  Physical Exam  Constitutional: She is oriented to person, place, and time and well-developed, well-nourished, and in no distress. No distress.  HENT:  Head: Normocephalic and atraumatic.  Eyes: Conjunctivae are normal.  Neck: Neck supple. No thyromegaly present.  Cardiovascular: Normal rate, regular rhythm and normal heart sounds.   Pulmonary/Chest: Effort normal and breath sounds normal. She has no wheezes.  Abdominal: She exhibits no distension and no mass.  Musculoskeletal: She exhibits no edema.  Lymphadenopathy:    She has no cervical adenopathy.  Neurological: She is alert and oriented to person, place, and time.  Skin: Skin is warm and dry. No rash noted. She is not diaphoretic.  Psychiatric: Memory, affect and judgment normal.    Lab Results  Component Value Date   TSH 2.00 05/21/2011   Lab Results  Component Value Date   WBC 5.1 05/21/2011   HGB 14.2 05/21/2011   HCT 43.4 05/21/2011   MCV 96.6 05/21/2011   PLT 161.0 05/21/2011    Lab Results  Component Value Date   CREATININE 0.6 05/21/2011   BUN 13 05/21/2011   NA 137 05/21/2011   K 4.8 05/21/2011   CL 102 05/21/2011   CO2 28 05/21/2011   Lab Results  Component Value  Date   ALT 20 05/21/2011   AST 14 05/21/2011   ALKPHOS 54 05/21/2011   BILITOT 0.6 05/21/2011   Lab Results  Component Value Date   CHOL 223* 05/21/2011   Lab Results  Component Value Date   HDL 70.50 05/21/2011   Lab Results  Component Value Date   LDLCALC 84 11/10/2010   Lab Results  Component Value Date   TRIG 89.0 05/21/2011   Lab Results  Component Value Date   CHOLHDL 3 05/21/2011     Assessment & Plan  Pure hypercholesterolemia Her cholesterol is improved but remains elevated would recommend continue to avoid trans fats, continue Zetia and add Welchol. She is given samples of the tabs to try 3 tabs twice daily and she is given samples of the powder to use once daily, she will let us know which one she likes better so we can send a prescription to the pharmacy for her, then we will recheck her lipids in 3 months time and she will maintain her exercise of regimen  Ischemic heart disease Asymptomatic at this time  Hypertension Improved with recheck and brings in a log for the past month which shows systolics ranging from 110 to 154 but most of them are in the 130s to 140s and her diastolics range from 60 to 82. No change to meds at this time

## 2011-05-28 NOTE — Assessment & Plan Note (Signed)
Her cholesterol is improved but remains elevated would recommend continue to avoid trans fats, continue Zetia and add Welchol. She is given samples of the tabs to try 3 tabs twice daily and she is given samples of the powder to use once daily, she will let us know which one she likes better so we can send a prescription to the pharmacy for her, then we will recheck her lipids in 3 months time and she will maintain her exercise of regimen

## 2011-05-28 NOTE — Assessment & Plan Note (Signed)
Improved with recheck and brings in a log for the past month which shows systolics ranging from 110 to 154 but most of them are in the 130s to 140s and her diastolics range from 60 to 82. No change to meds at this time

## 2011-05-31 ENCOUNTER — Telehealth: Payer: Self-pay | Admitting: Family Medicine

## 2011-05-31 DIAGNOSIS — E7800 Pure hypercholesterolemia, unspecified: Secondary | ICD-10-CM

## 2011-05-31 MED ORDER — COLESEVELAM HCL 3.75 G PO PACK: 3.7500 g | PACK | Freq: Every day | ORAL | Status: DC

## 2011-05-31 NOTE — Telephone Encounter (Signed)
Patient said that Dr Abner Greenspan asked her to Jennifer Peck to let her know which medication she needed an Rx for. She wants the [employer] tablet

## 2011-06-21 ENCOUNTER — Other Ambulatory Visit: Payer: Medicare Other

## 2011-06-21 LAB — HEMOCCULT SLIDES (X 3 CARDS)
Fecal Occult Blood: NEGATIVE
OCCULT 4: NEGATIVE
OCCULT 5: NEGATIVE

## 2011-06-26 NOTE — Progress Notes (Signed)
The patient believes the Jennifer Peck is causing her memory issues?

## 2011-06-27 NOTE — Progress Notes (Signed)
Patient informed. 

## 2011-08-20 ENCOUNTER — Other Ambulatory Visit (INDEPENDENT_AMBULATORY_CARE_PROVIDER_SITE_OTHER): Payer: Medicare Other

## 2011-08-20 ENCOUNTER — Other Ambulatory Visit: Payer: Medicare Other

## 2011-08-20 DIAGNOSIS — I119 Hypertensive heart disease without heart failure: Secondary | ICD-10-CM

## 2011-08-20 DIAGNOSIS — E7800 Pure hypercholesterolemia, unspecified: Secondary | ICD-10-CM

## 2011-08-20 LAB — HEPATIC FUNCTION PANEL
ALT: 17 U/L (ref 0–35)
AST: 17 U/L (ref 0–37)
Albumin: 4.2 g/dL (ref 3.5–5.2)
Alkaline Phosphatase: 47 U/L (ref 39–117)
Total Bilirubin: 0.7 mg/dL (ref 0.3–1.2)

## 2011-08-20 LAB — BASIC METABOLIC PANEL
CO2: 28 mEq/L (ref 19–32)
Calcium: 9.2 mg/dL (ref 8.4–10.5)
Creatinine, Ser: 0.8 mg/dL (ref 0.4–1.2)
GFR: 77.73 mL/min (ref >60.00)
Glucose, Bld: 88 mg/dL (ref 70–99)
Sodium: 135 mEq/L (ref 135–145)

## 2011-08-20 LAB — LIPID PANEL
HDL: 70.7 mg/dL (ref >39.00)
Triglycerides: 83 mg/dL (ref 0.0–149.0)

## 2011-08-20 NOTE — Progress Notes (Signed)
Quick Note:  Please make copy of labs for patient visit. ______ 

## 2011-08-23 ENCOUNTER — Encounter: Payer: Self-pay | Admitting: Cardiology

## 2011-08-23 ENCOUNTER — Ambulatory Visit (INDEPENDENT_AMBULATORY_CARE_PROVIDER_SITE_OTHER): Payer: Medicare Other | Admitting: Cardiology

## 2011-08-23 VITALS — BP 138/88 | HR 66 | Ht 63.0 in | Wt 135.0 lb

## 2011-08-23 DIAGNOSIS — I2510 Atherosclerotic heart disease of native coronary artery without angina pectoris: Secondary | ICD-10-CM

## 2011-08-23 DIAGNOSIS — E7800 Pure hypercholesterolemia, unspecified: Secondary | ICD-10-CM

## 2011-08-23 DIAGNOSIS — F419 Anxiety disorder, unspecified: Secondary | ICD-10-CM

## 2011-08-23 DIAGNOSIS — F411 Generalized anxiety disorder: Secondary | ICD-10-CM

## 2011-08-23 DIAGNOSIS — I259 Chronic ischemic heart disease, unspecified: Secondary | ICD-10-CM

## 2011-08-23 DIAGNOSIS — I10 Essential (primary) hypertension: Secondary | ICD-10-CM

## 2011-08-23 MED ORDER — ROSUVASTATIN CALCIUM 5 MG PO TABS: ORAL_TABLET | ORAL | Status: DC

## 2011-08-23 MED ORDER — ALPRAZOLAM 0.25 MG PO TABS: 0.2500 mg | ORAL_TABLET | Freq: Every day | ORAL | Status: DC | PRN

## 2011-08-23 NOTE — Patient Instructions (Signed)
Start Crestor 5 mg on Monday, Wednesday, and Friday  Your physician wants you to follow-up in: 6 months with fasting labs You will receive a reminder letter in the mail two months in advance. If you don't receive a letter, please call our office to schedule the follow-up appointment.

## 2011-08-23 NOTE — Assessment & Plan Note (Signed)
No recurrent chest pain or angina 

## 2011-08-23 NOTE — Progress Notes (Signed)
Jennifer Peck Date of Birth:  09-Oct-1922 [facility] [address] [place], [place]  [address] [phone]         Fax   [phone]  History of Present Illness: This pleasant 78 year old woman is seen for a scheduled followup office visit.  She has a history of essential hypertension and a history of ischemic heart disease.  She had 3 bare-metal stents placed on 12/20/09 into her mid left circumflex coronary artery, the second obtuse marginal vessel, and the first obtuse marginal vessel and into the mid right coronary artery.  He has done well since then with no recurrent angina.  Has occasional indigestion for which she takes Pepcid.  She has not had to take any sublingual nitroglycerin.  She continues to exercise [facility] the recreation [facility] [place] or fit and strong program she has a history of hypercholesterolemia.  Current Outpatient Prescriptions  Medication Sig Dispense Refill  . ALPRAZolam (XANAX) 0.25 MG tablet Take 0.25 mg by mouth daily as needed. Panic attack       . aspirin 325 MG tablet Take 325 mg by mouth daily.        . calcium carbonate (OS-CAL) 600 MG TABS Take 600 mg by mouth daily. Takes occ      . Calcium Carbonate-Vitamin D (CALCIUM 500 + D PO) Take by mouth daily.        Marland Kitchen co-enzyme Q-10 30 MG capsule Take 30 mg by mouth daily.       . Cyanocobalamin (VITAMIN B-12 CR PO) Take by mouth.      . enalapril (VASOTEC) 20 MG tablet Take 1 tablet (20 mg total) by mouth daily.  30 tablet  2  . ezetimibe (ZETIA) 10 MG tablet Take 1 tablet (10 mg total) by mouth daily.  30 tablet  11  . Ketotifen Fumarate (ALLERGY EYE DROPS OP) Place 2 drops into both eyes as needed. Red itchy eyes       . magnesium oxide (MAG-OX) 400 MG tablet Take 400 mg by mouth daily.        . metoprolol succinate (TOPROL-XL) 25 MG 24 hr tablet Takes 1 tab daily  90 tablet  3  . Multiple Vitamin (MULTIVITAMIN) tablet Take 1 tablet by mouth daily. Takes occ      . NAFTIN 1 %  cream       . Omega-3 Fatty Acids (FISH OIL PO) Take 1 capsule by mouth daily.       Marland Kitchen OVER THE COUNTER MEDICATION Sinus buster- daily prn      . OVER THE COUNTER MEDICATION Eye drops 2 drops in each eye daily prn      . sodium chloride (OCEAN) 0.65 % nasal spray Place 2 sprays into the nose as needed.      . Sodium Chloride-Sodium Bicarb (RA SINUS WASH NETI POT NA) Place into the nose as needed. sinuses       . rosuvastatin (CRESTOR) 5 MG tablet Daily or as directed  30 tablet  11    Allergies  Allergen Reactions  . Atenolol     Caused fatigue   . Codeine Nausea And Vomiting  . Gabapentin     Memory loss and confusion  . Lovastatin     myalgia  . Simvastatin     Her leg gave away on her and states thinks secondary to Simvastatin  . Welchol (Colesevelam Hcl)     Patient Active Problem List  Diagnoses  . Hypertension  .  Anxiety  . Vertigo  . PAC (premature atrial contraction)  . Encounter for long-term (current) use of other medications  . Pure hypercholesterolemia  . Ischemic heart disease  . BCC (basal cell carcinoma), face  . SCC (squamous cell carcinoma), arm  . Bilateral bunions  . Peripheral neuropathy  . Constipation  . Left leg pain  . Hyponatremia  . Osteopenia  . Hyperkalemia  . Acute sinusitis  . Hyperlipidemia  . Allergic state  . Seborrheic keratosis    History  Smoking status  . Former Smoker  . Quit date: 09/04/1977  Smokeless tobacco  . Never Used    History  Alcohol Use  . Yes    only on Holidays    Family History  Problem Relation Age of Onset  . Stroke Mother   . Heart disease Father   . Hypertension Daughter   . Hyperlipidemia Daughter   . Hypertension Son   . Cancer Daughter     rectal/ chemo and radiation  . Hypertension Daughter   . Hypertension Son   . Hyperlipidemia Son   . Hypertension Son   . Vision loss Maternal Grandfather   . Stroke Paternal Grandmother   . Asthma Paternal Grandfather     Review of  Systems: Constitutional: no fever chills diaphoresis or fatigue or change in weight.  Head and neck: no hearing loss, no epistaxis, no photophobia or visual disturbance. Respiratory: No cough, shortness of breath or wheezing. Cardiovascular: No chest pain peripheral edema, palpitations. Gastrointestinal: No abdominal distention, no abdominal pain, no change in bowel habits hematochezia or melena. Genitourinary: No dysuria, no frequency, no urgency, no nocturia. Musculoskeletal:No arthralgias, no back pain, no gait disturbance or myalgias. Neurological: No dizziness, no headaches, no numbness, no seizures, no syncope, no weakness, no tremors. Hematologic: No lymphadenopathy, no easy bruising. Psychiatric: No confusion, no hallucinations, no sleep disturbance.    Physical Exam: Filed Vitals:   08/23/11 1354  BP: 138/88  Pulse: 66   the general appearance reveals a well-developed elderly woman in no distress.The head and neck exam reveals pupils equal and reactive.  Extraocular movements are full.  There is no scleral icterus.  The mouth and pharynx are normal.  The neck is supple.  The carotids reveal no bruits.  The jugular venous pressure is normal.  The  thyroid is not enlarged.  There is no lymphadenopathy.  The chest is clear to percussion and auscultation.  There are no rales or rhonchi.  Expansion of the chest is symmetrical.  The precordium is quiet.  The first heart sound is normal.  The second heart sound is physiologically split.  There is no murmur gallop rub or click.  There is no abnormal lift or heave.  The abdomen is soft and nontender.  The bowel sounds are normal.  The liver and spleen are not enlarged.  There are no abdominal masses.  There are no abdominal bruits.  Extremities reveal good pedal pulses.  There is no phlebitis or edema.  There is no cyanosis or clubbing.  Strength is normal and symmetrical in all extremities.  There is no lateralizing weakness.  There are no  sensory deficits.  The skin is warm and dry.  There is no rash.  EKG shows normal sinus rhythm and is within normal limits   Assessment / Plan: Continue same medication.  Recheck in 6 months for followup office visit and fasting lab work.  Trial of low dose Crestor

## 2011-08-23 NOTE — Assessment & Plan Note (Signed)
The patient has a history of essential hypertension.  She brought in her blood pressures which have been very good.  She is not having any headaches or dizzy spells.  She denies any exertional chest pain.

## 2011-08-23 NOTE — Assessment & Plan Note (Signed)
Her LDL cholesterol remains above 100 despite being on ezetimibe.  Patient in the past did not tolerate simvastatin but is willing to try another statin.  She recently tried [employer] which she could not tolerate.  We will add Crestor 5 mg initially and a dose of just 3 times a week Monday Wednesday and Friday and see how she responds.  Continue ezetimibe  also

## 2011-08-27 ENCOUNTER — Ambulatory Visit: Payer: Medicare Other | Admitting: Family Medicine

## 2011-10-08 ENCOUNTER — Encounter: Payer: Self-pay | Admitting: Family Medicine

## 2011-10-08 ENCOUNTER — Ambulatory Visit (INDEPENDENT_AMBULATORY_CARE_PROVIDER_SITE_OTHER): Payer: Medicare Other | Admitting: Family Medicine

## 2011-10-08 VITALS — BP 152/76 | HR 77 | Temp 98.2°F | Ht 61.25 in | Wt 133.1 lb

## 2011-10-08 DIAGNOSIS — M79609 Pain in unspecified limb: Secondary | ICD-10-CM

## 2011-10-08 DIAGNOSIS — M79605 Pain in left leg: Secondary | ICD-10-CM

## 2011-10-08 DIAGNOSIS — I10 Essential (primary) hypertension: Secondary | ICD-10-CM

## 2011-10-08 DIAGNOSIS — E785 Hyperlipidemia, unspecified: Secondary | ICD-10-CM

## 2011-10-08 DIAGNOSIS — M79606 Pain in leg, unspecified: Secondary | ICD-10-CM

## 2011-10-08 LAB — RENAL FUNCTION PANEL
Calcium: 9.7 mg/dL (ref 8.4–10.5)
Creatinine, Ser: 0.7 mg/dL (ref 0.4–1.2)
Glucose, Bld: 113 mg/dL — ABNORMAL HIGH (ref 70–99)
Phosphorus: 3.3 mg/dL (ref 2.3–4.6)
Potassium: 4.7 mEq/L (ref 3.5–5.1)
Sodium: 135 mEq/L (ref 135–145)

## 2011-10-08 LAB — CBC
HCT: 43.6 % (ref 36.0–46.0)
MCHC: 32.8 g/dL (ref 30.0–36.0)
MCV: 96.5 fl (ref 78.0–100.0)
RDW: 13.6 % (ref 11.5–14.6)

## 2011-10-08 MED ORDER — CYCLOBENZAPRINE HCL 5 MG PO TABS: 5.0000 mg | ORAL_TABLET | Freq: Every evening | ORAL | Status: AC | PRN

## 2011-10-08 MED ORDER — IBUPROFEN 200 MG PO TABS: 200.0000 mg | ORAL_TABLET | Freq: Two times a day (BID) | ORAL | Status: AC | PRN

## 2011-10-08 NOTE — Patient Instructions (Addendum)
Try applying moist heat to leg for 15 minutes twice a day  Hold Crestor and Zetia, if pains resolve add back Crestor first. If pain does not recur add back Zetia in 3-4 weeks after that. If pain recurs. Stop Crestor, wait til pain resolves again then retry Zetia on own, if pain recures again stop Zetia and return for further evaluation here.  Cyclobenzaprine at night on occasion can cause some confusion, let a family member know the first time you take it, only take at bed  Watch sodium

## 2011-10-09 ENCOUNTER — Ambulatory Visit: Payer: Medicare Other | Admitting: Family Medicine

## 2011-10-09 ENCOUNTER — Telehealth: Payer: Self-pay

## 2011-10-09 NOTE — Telephone Encounter (Signed)
Pt walked in to speak to me and stated that the muscle relaxer didn't work that MD called in. Pt would like to know if she can double up on the muscle relaxer and ibuprofen? Per MD I got a verbal that this is exactly what the patient should do and if this doesn't help to call and let us know and we could schedule for an xray.  Left a message for patient to return my call

## 2011-10-09 NOTE — Telephone Encounter (Signed)
Patient called back to speak with Christy at 11:56a.m.

## 2011-10-09 NOTE — Telephone Encounter (Signed)
Patient informed and stated understanding.

## 2011-10-10 ENCOUNTER — Other Ambulatory Visit: Payer: Medicare Other

## 2011-10-10 ENCOUNTER — Other Ambulatory Visit: Payer: Self-pay | Admitting: Family Medicine

## 2011-10-10 ENCOUNTER — Ambulatory Visit (INDEPENDENT_AMBULATORY_CARE_PROVIDER_SITE_OTHER)
Admission: RE | Admit: 2011-10-10 | Discharge: 2011-10-10 | Disposition: A | Discharge status: Home / Self Care | Payer: Medicare Other | Source: Ambulatory Visit | Attending: Family Medicine | Admitting: Family Medicine

## 2011-10-10 ENCOUNTER — Telehealth: Payer: Self-pay | Admitting: Family Medicine

## 2011-10-10 ENCOUNTER — Telehealth: Payer: Self-pay

## 2011-10-10 DIAGNOSIS — M79605 Pain in left leg: Secondary | ICD-10-CM

## 2011-10-10 DIAGNOSIS — M79609 Pain in unspecified limb: Secondary | ICD-10-CM

## 2011-10-10 MED ORDER — HYDROCODONE-ACETAMINOPHEN 2.5-325 MG PO TABS: 1.0000 | ORAL_TABLET | Freq: Two times a day (BID) | ORAL | Status: DC | PRN

## 2011-10-10 NOTE — Telephone Encounter (Signed)
[facility] does not have the 325 strength but they do have the 500 strength. Can the Rx be changed?

## 2011-10-10 NOTE — Telephone Encounter (Signed)
Patient left a message stating that this is the 7th night of not sleeping and the medication is not helping. Pt stated that she wants something for pain called into [facility]. Pt stated she is having cataract surgery on Monday and right now she can't walk.  Spoke with MD and MD verbally stated pt needs to have an xray to make sure shes not missing anything and then we could discuss pain medication. Silva Bandy is looking to see if she can do this xray.

## 2011-10-10 NOTE — Telephone Encounter (Signed)
I have written her some low dose Hydrocodone to see if it helps abut warn her it is distantly related to codeine, which has made her nauseous in past. If she has trouble, stop it and let us know. Also she can have a referral to ortho too if she would like

## 2011-10-10 NOTE — Telephone Encounter (Signed)
Pt informed and is on her way her.

## 2011-10-10 NOTE — Telephone Encounter (Signed)
Jennifer Peck states she can do this xray so I will call and inform the patient

## 2011-10-10 NOTE — Telephone Encounter (Signed)
Patient called back, the pharmacy found the Rx. Please disregard the previous note.

## 2011-10-10 NOTE — Telephone Encounter (Signed)
Jennifer Peck informed pt and pt stated understanding

## 2011-10-10 NOTE — Telephone Encounter (Signed)
pts phone is busy. I will continue to try and inform pt of normal lab results

## 2011-10-10 NOTE — Telephone Encounter (Signed)
Caller: Cathy/[facility]; PCP: Danise Edge; CB#: [phone];  Call regarding Medication Issue; Roe Coombs, RPh reported pharmacy did not have strength of hydrocodone/acetaminopehn ordered 10/10/11 when pt was in pharmacy approx 0930.  Stated it is no longer an issue since pt took the RX back and was going to try a different drug store.  Info noted and sent to [facility] for caller with med question that was already resolved per Med Question Call Guideline.

## 2011-10-10 NOTE — Telephone Encounter (Signed)
FYI

## 2011-10-11 NOTE — Assessment & Plan Note (Signed)
Patient has stopper her Zetia and Crestor hoping this would help her myalgias, last dose was 7/18 without any great improvement. Will have her restart if no improvment

## 2011-10-11 NOTE — Assessment & Plan Note (Signed)
Improved some with recheck is experiencing some increased pain. Will recheck at next visit

## 2011-10-12 ENCOUNTER — Other Ambulatory Visit: Payer: Medicare Other

## 2011-10-12 NOTE — Progress Notes (Signed)
Patient ID: Jennifer Peck, female   DOB: Dec 02, 1922, 78 y.o.   MRN: [id number] Jennifer Peck [id number] 13-Feb-1923 10/12/2011      Progress Note-Follow Up  Subjective  Chief Complaint  Chief Complaint  Patient presents with  . Leg Pain    left X 1 week- possibly from cholesterol medication?  . Rectal Bleeding    since taking the eye drops    HPI  And is an 30 we'll Caucasian female who is in today complaining of left lower extremity pain. The pain is revealed an anterior tibial active just above her ankle. She denies any acute injury or falls but does acknowledge she has been exercising more recently. No chest pain, palpitations, shortness of breath. No fevers or numbness in the  Past Medical History  Diagnosis Date  . Hypertension   . Anxiety   . Vertigo   . Coronary artery disease   . Hyperlipidemia   . BCC (basal cell carcinoma), face 11/06/2010  . SCC (squamous cell carcinoma), arm 11/06/2010  . Bilateral bunions 11/06/2010  . Peripheral neuropathy 11/06/2010  . Constipation 11/06/2010  . Hyponatremia 11/23/2010  . Osteopenia 11/23/2010  . Hyperkalemia 01/23/2011  . Hyperlipidemia 04/30/2011  . Allergic state 04/30/2011  . Seborrheic keratosis 04/30/2011    Past Surgical History  Procedure Date  . Cardiac catheterization 12/20/2009    3 bare metal stents  . Tubal ligation   . Eye surgery     cataract- left eye 09-24-11    Family History  Problem Relation Age of Onset  . Stroke Mother   . Heart disease Father   . Hypertension Daughter   . Hyperlipidemia Daughter   . Hypertension Son   . Cancer Daughter     rectal/ chemo and radiation  . Hypertension Daughter   . Hypertension Son   . Hyperlipidemia Son   . Hypertension Son   . Vision loss Maternal Grandfather   . Stroke Paternal Grandmother   . Asthma Paternal Grandfather     History   Social History  . Marital Status: Single    Spouse Name: N/A    Number of Children: N/A  . Years of Education: N/A    Occupational History  . Not on file.   Social History Main Topics  . Smoking status: Former Smoker    Quit date: 09/04/1977  . Smokeless tobacco: Never Used  . Alcohol Use: Yes     only on Holidays  . Drug Use: No  . Sexually Active: Not on file   Other Topics Concern  . Not on file   Social History Narrative  . No narrative on file    Current Outpatient Prescriptions on File Prior to Visit  Medication Sig Dispense Refill  . ALPRAZolam (XANAX) 0.25 MG tablet Take 1 tablet (0.25 mg total) by mouth daily as needed. Panic attack  30 tablet  3  . aspirin 325 MG tablet Take 325 mg by mouth daily.        . calcium carbonate (OS-CAL) 600 MG TABS Take 600 mg by mouth daily. Takes occ      . Calcium Carbonate-Vitamin D (CALCIUM 500 + D PO) Take by mouth daily.        . Cyanocobalamin (VITAMIN B-12 CR PO) Take by mouth.      . enalapril (VASOTEC) 20 MG tablet Take 1 tablet (20 mg total) by mouth daily.  30 tablet  2  . metoprolol succinate (TOPROL-XL) 25 MG 24 hr tablet Takes 1  tab daily  90 tablet  3  . Multiple Vitamin (MULTIVITAMIN) tablet Take 1 tablet by mouth daily. Takes occ      . NAFTIN 1 % cream       . Omega-3 Fatty Acids (FISH OIL PO) Take 1 capsule by mouth daily.       Marland Kitchen OVER THE COUNTER MEDICATION Sinus buster- daily prn      . sodium chloride (OCEAN) 0.65 % nasal spray Place 2 sprays into the nose as needed.      . Sodium Chloride-Sodium Bicarb (RA SINUS WASH NETI POT NA) Place into the nose as needed. sinuses       . co-enzyme Q-10 30 MG capsule Take 30 mg by mouth daily.       Marland Kitchen Ketotifen Fumarate (ALLERGY EYE DROPS OP) Place 2 drops into both eyes as needed. Red itchy eyes       . magnesium oxide (MAG-OX) 400 MG tablet Take 400 mg by mouth daily.        . rosuvastatin (CRESTOR) 5 MG tablet Daily or as directed  30 tablet  11    Allergies  Allergen Reactions  . Atenolol     Caused fatigue   . Codeine Nausea And Vomiting  . Gabapentin     Memory loss and  confusion  . Lovastatin     myalgia  . Simvastatin     Her leg gave away on her and states thinks secondary to Simvastatin  . Welchol (Colesevelam Hcl)     Review of Systems  Review of Systems  Constitutional: Negative for fever and malaise/fatigue.  HENT: Negative for congestion.   Eyes: Negative for discharge.  Respiratory: Negative for shortness of breath.   Cardiovascular: Negative for chest pain, palpitations and leg swelling.  Gastrointestinal: Negative for nausea, abdominal pain and diarrhea.  Genitourinary: Negative for dysuria.  Musculoskeletal: Positive for joint pain. Negative for falls.       Left leg pain, distal anterior tibial plateau. No acute injury but has begun to exercise more again recently  Skin: Negative for rash.  Neurological: Negative for loss of consciousness and headaches.  Endo/Heme/Allergies: Negative for polydipsia.  Psychiatric/Behavioral: Negative for depression and suicidal ideas. The patient is not nervous/anxious and does not have insomnia.     Objective  BP 152/76  Pulse 77  Temp 98.2 F (36.8 C) (Temporal)  Ht 5' 1.25" (1.556 m)  Wt 133 lb 1.9 oz (60.383 kg)  BMI 24.95 kg/m2  SpO2 96%  Physical Exam  Physical Exam  Constitutional: She is oriented to person, place, and time and well-developed, well-nourished, and in no distress. No distress.  HENT:  Head: Normocephalic and atraumatic.  Eyes: Conjunctivae are normal.  Neck: Neck supple. No thyromegaly present.  Cardiovascular: Normal rate, regular rhythm and normal heart sounds.   Pulmonary/Chest: Effort normal and breath sounds normal. She has no wheezes.  Abdominal: She exhibits no distension and no mass.  Musculoskeletal: She exhibits no edema.  Lymphadenopathy:    She has no cervical adenopathy.  Neurological: She is alert and oriented to person, place, and time.  Skin: Skin is warm and dry. No rash noted. She is not diaphoretic.  Psychiatric: Memory, affect and judgment  normal.    Lab Results  Component Value Date   TSH 2.00 05/21/2011   Lab Results  Component Value Date   WBC 7.5 10/08/2011   HGB 14.3 10/08/2011   HCT 43.6 10/08/2011   MCV 96.5 10/08/2011   PLT 162.0  10/08/2011   Lab Results  Component Value Date   CREATININE 0.7 10/08/2011   BUN 12 10/08/2011   NA 135 10/08/2011   K 4.7 10/08/2011   CL 98 10/08/2011   CO2 27 10/08/2011   Lab Results  Component Value Date   ALT 17 08/20/2011   AST 17 08/20/2011   ALKPHOS 47 08/20/2011   BILITOT 0.7 08/20/2011   Lab Results  Component Value Date   CHOL 197 08/20/2011   Lab Results  Component Value Date   HDL 70.70 08/20/2011   Lab Results  Component Value Date   LDLCALC 110* 08/20/2011   Lab Results  Component Value Date   TRIG 83.0 08/20/2011   Lab Results  Component Value Date   CHOLHDL 3 08/20/2011     Assessment & Plan  Hypertension Improved some with recheck is experiencing some increased pain. Will recheck at next visit  Hyperlipidemia Patient has stopper her Zetia and Crestor hoping this would help her myalgias, last dose was 7/18 without any great improvement. Will have her restart if no improvment  Left leg pain Pain is just above the ankle, try ice and Aspercreme, xray negative for any acute process. Will try Aspercreme and given pain meds to use prn

## 2011-10-12 NOTE — Assessment & Plan Note (Signed)
Pain is just above the ankle, try ice and Aspercreme, xray negative for any acute process. Will try Aspercreme and given pain meds to use prn

## 2011-10-16 LAB — FECAL OCCULT BLOOD, IMMUNOCHEMICAL: Fecal Occult Bld: NEGATIVE

## 2011-10-18 ENCOUNTER — Telehealth: Payer: Self-pay

## 2011-10-18 NOTE — Telephone Encounter (Signed)
Have her try 2 qhs for the next few days and then if no improvement she should come back in so we can refer her to ortho for the pain

## 2011-10-18 NOTE — Telephone Encounter (Signed)
Patient left a message stating that she has been up every night since taking the Hydrocodone? Patient would like to know if she can increase her dose? Patient stated the pain is still there. Please advise? I tried to call patient to inform her that MD is out of the office this afternoon but someone picks up and then hangs up?

## 2011-10-18 NOTE — Telephone Encounter (Signed)
Patient did return my call and was informed that I got her message and the MD would be back in the office tomorrow morning and then I would call her with an answer. Pt stated agreement and understanding

## 2011-10-19 MED ORDER — TRAMADOL HCL 50 MG PO TABS: 50.0000 mg | ORAL_TABLET | Freq: Two times a day (BID) | ORAL | Status: DC | PRN

## 2011-10-19 NOTE — Telephone Encounter (Signed)
MD stated verbally pt can have Tramadol 50 mg 1 bid prn #40 X 0 and a referral to ortho.  Patient informed and states she would like the medication sent to [facility] in [facility] and she doesn't want the ortho because she believes the statin is causing the pain. If the tramadol doesn't help pt states she will contact her heart doctor.  RX sent

## 2011-10-19 NOTE — Telephone Encounter (Signed)
I called to inform patient and pt stated her daughter told her to take 1/2 more last night and pt states she was up at 10 pm with even more pain. Pt states she can't take this medicine because it doesn't agree with her. Please advise?

## 2011-11-01 ENCOUNTER — Other Ambulatory Visit: Payer: Self-pay

## 2011-11-01 ENCOUNTER — Other Ambulatory Visit: Payer: Self-pay | Admitting: Cardiology

## 2011-11-01 ENCOUNTER — Encounter: Payer: Self-pay | Admitting: Family Medicine

## 2011-11-01 ENCOUNTER — Ambulatory Visit (INDEPENDENT_AMBULATORY_CARE_PROVIDER_SITE_OTHER): Payer: Medicare Other | Admitting: Family Medicine

## 2011-11-01 VITALS — BP 167/80 | HR 77 | Temp 97.9°F | Ht 61.25 in | Wt 133.8 lb

## 2011-11-01 DIAGNOSIS — I10 Essential (primary) hypertension: Secondary | ICD-10-CM

## 2011-11-01 MED ORDER — ENALAPRIL MALEATE 20 MG PO TABS: 20.0000 mg | ORAL_TABLET | Freq: Every day | ORAL | Status: DC

## 2011-11-01 MED ORDER — CHLORTHALIDONE 25 MG PO TABS: 25.0000 mg | ORAL_TABLET | Freq: Every day | ORAL | Status: DC

## 2011-11-01 NOTE — Assessment & Plan Note (Addendum)
Control not ideal. Will add chlorthalidone 25mg  qd to her current enalapril 20mg  qd and her Toprol XL daily. Check BMET in 1 wk, f/u with Dr. Abner Greenspan for HTN f/u in 2-3 wks.

## 2011-11-01 NOTE — Progress Notes (Signed)
OFFICE NOTE  11/01/2011  CC:  Chief Complaint  Patient presents with  . Hypertension    BP this morning 177/101     HPI: Patient is a 78 y.o. Caucasian female who is here for elevated bp.  This morning bp at home with her wrist cuff was 177/101.  She has had some elevated bp's lately, esp since having more pain problems--some hips, thighs, left ankle pains that it sounds like were tentatively blamed on her statin.  Since her crestor was stopped she does say these pains have improved.  She is also doing PT now as well. Denies CP, palpitations, SOB, HA, or vision problems.  She does c/o intermittent vetiginous sensation that seems random, not related to any particular body/head position.  No acute hearing changes or tinnitus. She reports that an attempt to increase her enalapril in the past resulted in hyperkalemia so this was cut back.    Pertinent PMH:  Past Medical History  Diagnosis Date  . Hypertension   . Anxiety   . Vertigo   . Coronary artery disease   . Hyperlipidemia   . BCC (basal cell carcinoma), face 11/06/2010  . SCC (squamous cell carcinoma), arm 11/06/2010  . Bilateral bunions 11/06/2010  . Peripheral neuropathy 11/06/2010  . Constipation 11/06/2010  . Hyponatremia 11/23/2010  . Osteopenia 11/23/2010  . Hyperkalemia 01/23/2011  . Hyperlipidemia 04/30/2011  . Allergic state 04/30/2011  . Seborrheic keratosis 04/30/2011    MEDS:  Outpatient Prescriptions Prior to Visit  Medication Sig Dispense Refill  . ALPRAZolam (XANAX) 0.25 MG tablet Take 1 tablet (0.25 mg total) by mouth daily as needed. Panic attack  30 tablet  3  . aspirin 325 MG tablet Take 325 mg by mouth daily.        . Benfotiamine 150 MG CAPS Take 1 capsule by mouth daily. For numbness in feet      . Calcium Carbonate-Vitamin D (CALCIUM 500 + D PO) Take by mouth daily.        . Cyanocobalamin (VITAMIN B-12 CR PO) Take by mouth.      . enalapril (VASOTEC) 20 MG tablet Take 1 tablet (20 mg total) by mouth daily.   30 tablet  2  . Ketotifen Fumarate (ALLERGY EYE DROPS OP) Place 2 drops into both eyes as needed. Red itchy eyes       . magnesium oxide (MAG-OX) 400 MG tablet Take 400 mg by mouth daily.        . metoprolol succinate (TOPROL-XL) 25 MG 24 hr tablet Takes 1 tab daily  90 tablet  3  . Multiple Vitamin (MULTIVITAMIN) tablet Take 1 tablet by mouth daily. Takes occ      . NAFTIN 1 % cream       . Omega-3 Fatty Acids (FISH OIL PO) Take 1 capsule by mouth daily.       Marland Kitchen OVER THE COUNTER MEDICATION Sinus buster- daily prn      . prednisoLONE acetate (PRED FORTE) 1 % ophthalmic suspension Place 1 drop into the left eye 4 (four) times daily.      . sodium chloride (OCEAN) 0.65 % nasal spray Place 2 sprays into the nose as needed.      . Sodium Chloride-Sodium Bicarb (RA SINUS WASH NETI POT NA) Place into the nose as needed. sinuses       . Bromfenac Sodium (PROLENSA) 0.07 % SOLN Apply 1 drop to eye daily.      . calcium carbonate (OS-CAL) 600 MG TABS  Take 600 mg by mouth daily. Takes occ      . co-enzyme Q-10 30 MG capsule Take 30 mg by mouth daily.       . Hydrocodone-Acetaminophen 2.5-325 MG TABS Take 1 tablet by mouth 2 (two) times daily as needed. For pain, may cause sedation, use sparingly  20 tablet  1  . rosuvastatin (CRESTOR) 5 MG tablet Daily or as directed  30 tablet  11  . tobramycin (TOBREX) 0.3 % ophthalmic solution Place 1 drop into the left eye daily.      . traMADol (ULTRAM) 50 MG tablet Take 1 tablet (50 mg total) by mouth 2 (two) times daily as needed.  40 tablet  0  *Pt not currently taking crestor  PE: Blood pressure 167/80, pulse 77, temperature 97.9 F (36.6 C), temperature source Temporal, height 5' 1.25" (1.556 m), weight 133 lb 12.8 oz (60.691 kg), SpO2 97.00%. Gen: Alert, well appearing.  Patient is oriented to person, place, time, and situation. Neck: supple/nontender.  No LAD, mass, or TM.  Carotid pulses 2+ bilaterally, without bruits. CV: RRR, no m/r/g.   LUNGS: CTA  bilat, nonlabored resps, good aeration in all lung fields. EXT: No significant edema.  No clubbing or cyanosis. No calf or thigh tenderness.  Homan's negative bilat.  IMPRESSION AND PLAN:  Hypertension Control not ideal. Will add chlorthalidone 25mg  qd to her current enalapril 20mg  qd and her Toprol XL daily. Check BMET in 1 wk, f/u with Dr. Abner Greenspan for HTN f/u in 2-3 wks.     FOLLOW UP: 1 wk for BMET, 2-3 wks office f/u.

## 2011-11-01 NOTE — Telephone Encounter (Signed)
Dr. Abner Greenspan refilling

## 2011-11-01 NOTE — Telephone Encounter (Signed)
RX for 90 day supply sent to pharmacy.  

## 2011-11-07 ENCOUNTER — Other Ambulatory Visit (INDEPENDENT_AMBULATORY_CARE_PROVIDER_SITE_OTHER): Payer: Medicare Other

## 2011-11-07 ENCOUNTER — Ambulatory Visit: Payer: Medicare Other | Admitting: Family Medicine

## 2011-11-07 DIAGNOSIS — I10 Essential (primary) hypertension: Secondary | ICD-10-CM

## 2011-11-07 LAB — RENAL FUNCTION PANEL
Albumin: 4.3 g/dL (ref 3.5–5.2)
BUN: 26 mg/dL — ABNORMAL HIGH (ref 6–23)
Calcium: 9.3 mg/dL (ref 8.4–10.5)
Chloride: 84 mEq/L — ABNORMAL LOW (ref 96–112)
Glucose, Bld: 75 mg/dL (ref 70–99)
Phosphorus: 4.1 mg/dL (ref 2.3–4.6)
Potassium: 3.7 mEq/L (ref 3.5–5.1)

## 2011-11-14 ENCOUNTER — Telehealth: Payer: Self-pay | Admitting: Cardiology

## 2011-11-14 DIAGNOSIS — E785 Hyperlipidemia, unspecified: Secondary | ICD-10-CM

## 2011-11-14 NOTE — Telephone Encounter (Signed)
Advised patient

## 2011-11-14 NOTE — Telephone Encounter (Signed)
New Problem:    Patient called in wanting to know if she needed to start taking Zetia because her Crestor caused her leg to break down on 10/08/11.  Please call back.

## 2011-11-14 NOTE — Telephone Encounter (Signed)
Leg gave away about a year ago when she was taking simvastatin and she discontinued it.  No more trouble until 10/08/11 when it happened again after starting Crestor in June.  Has not been on anything since then.  Is scared of taking statin secondary to this.  Is not taking Zetia but does have it on hand.  Will forward to  Dr. Patty Sermons for review

## 2011-11-14 NOTE — Telephone Encounter (Signed)
We should avoid all statins on her in the future.  Okay for her to try zetia 10 mg 1 daily which she has on hand

## 2011-11-16 ENCOUNTER — Other Ambulatory Visit (INDEPENDENT_AMBULATORY_CARE_PROVIDER_SITE_OTHER): Payer: Medicare Other

## 2011-11-16 DIAGNOSIS — I10 Essential (primary) hypertension: Secondary | ICD-10-CM

## 2011-11-16 LAB — BASIC METABOLIC PANEL
BUN: 17 mg/dL (ref 6–23)
Chloride: 100 mEq/L (ref 96–112)
Creatinine, Ser: 0.7 mg/dL (ref 0.4–1.2)
Glucose, Bld: 91 mg/dL (ref 70–99)
Potassium: 4.2 mEq/L (ref 3.5–5.1)

## 2011-11-23 ENCOUNTER — Encounter: Payer: Self-pay | Admitting: Family Medicine

## 2011-11-23 ENCOUNTER — Ambulatory Visit (INDEPENDENT_AMBULATORY_CARE_PROVIDER_SITE_OTHER): Payer: Medicare Other | Admitting: Family Medicine

## 2011-11-23 VITALS — BP 160/94 | HR 72 | Temp 98.0°F | Ht 61.25 in | Wt 133.8 lb

## 2011-11-23 DIAGNOSIS — E785 Hyperlipidemia, unspecified: Secondary | ICD-10-CM

## 2011-11-23 DIAGNOSIS — E871 Hypo-osmolality and hyponatremia: Secondary | ICD-10-CM

## 2011-11-23 DIAGNOSIS — I10 Essential (primary) hypertension: Secondary | ICD-10-CM

## 2011-11-23 LAB — RENAL FUNCTION PANEL
BUN: 22 mg/dL (ref 6–23)
CO2: 25 mEq/L (ref 19–32)
Calcium: 9.3 mg/dL (ref 8.4–10.5)
Creatinine, Ser: 0.7 mg/dL (ref 0.4–1.2)
Glucose, Bld: 103 mg/dL — ABNORMAL HIGH (ref 70–99)
Sodium: 135 mEq/L (ref 135–145)

## 2011-11-23 NOTE — Progress Notes (Signed)
Patient ID: Jennifer Peck, female   DOB: Jun 20, 1922, 78 y.o.   MRN: [id number] Jennifer Peck [id number] 09-16-1922 11/23/2011      Progress Note-Follow Up  Subjective  Chief Complaint  Chief Complaint  Patient presents with  . Follow-up    1 month    HPI  Patient is an 78 year old Caucasian female who is in today for followup. He uncontrollable for left hip pain and her hypertension due to complications. Her left hip pain seemed to flare back up again we started her back on a statin due to her cholesterol. We stop the statin and tried several medications. None of the medications including tramadol, Norco, Flexeril was helpful. She was eventually started in physical therapy and is doing much better at this time. During all this with trouble controlling her blood pressure too. Unfortunately when we added chlorthalidone she developed low sodium. With twice a day dosing enalapril she had some issues with her potassium and kidney function. She says she feels well. She denies chest pain, palpitations, shortness of breath, recent illness, fevers, congestion, GI or GU complaints at today's visit. She is now taking the enalapril once a day and has stopped chlorthalidone.  Past Medical History  Diagnosis Date  . Hypertension   . Anxiety   . Vertigo   . Coronary artery disease   . Hyperlipidemia   . BCC (basal cell carcinoma), face 11/06/2010  . SCC (squamous cell carcinoma), arm 11/06/2010  . Bilateral bunions 11/06/2010  . Peripheral neuropathy 11/06/2010  . Constipation 11/06/2010  . Hyponatremia 11/23/2010  . Osteopenia 11/23/2010  . Hyperkalemia 01/23/2011  . Hyperlipidemia 04/30/2011  . Allergic state 04/30/2011  . Seborrheic keratosis 04/30/2011    Past Surgical History  Procedure Date  . Cardiac catheterization 12/20/2009    3 bare metal stents  . Tubal ligation   . Eye surgery     cataract- left eye 09-24-11    Family History  Problem Relation Age of Onset  . Stroke Mother   . Heart  disease Father   . Hypertension Daughter   . Hyperlipidemia Daughter   . Hypertension Son   . Cancer Daughter     rectal/ chemo and radiation  . Hypertension Daughter   . Hypertension Son   . Hyperlipidemia Son   . Hypertension Son   . Vision loss Maternal Grandfather   . Stroke Paternal Grandmother   . Asthma Paternal Grandfather     History   Social History  . Marital Status: Single    Spouse Name: N/A    Number of Children: N/A  . Years of Education: N/A   Occupational History  . Not on file.   Social History Main Topics  . Smoking status: Former Smoker    Quit date: 09/04/1977  . Smokeless tobacco: Never Used  . Alcohol Use: Yes     only on Holidays  . Drug Use: No  . Sexually Active: Not on file   Other Topics Concern  . Not on file   Social History Narrative  . No narrative on file    Current Outpatient Prescriptions on File Prior to Visit  Medication Sig Dispense Refill  . ALPRAZolam (XANAX) 0.25 MG tablet Take 1 tablet (0.25 mg total) by mouth daily as needed. Panic attack  30 tablet  3  . aspirin 325 MG tablet Take 325 mg by mouth daily.        . Benfotiamine 150 MG CAPS Take 1 capsule by mouth daily. For numbness  in feet      . Calcium Carbonate-Vitamin D (CALCIUM 500 + D PO) Take by mouth daily.        . chlorthalidone (HYGROTON) 25 MG tablet Take 1 tablet (25 mg total) by mouth daily.  30 tablet  5  . Cyanocobalamin (VITAMIN B-12 CR PO) Take by mouth.      . enalapril (VASOTEC) 20 MG tablet Take 1 tablet (20 mg total) by mouth daily.  90 tablet  1  . ezetimibe (ZETIA) 10 MG tablet Take 10 mg by mouth daily.      Marland Kitchen Ketotifen Fumarate (ALLERGY EYE DROPS OP) Place 2 drops into both eyes as needed. Red itchy eyes       . magnesium oxide (MAG-OX) 400 MG tablet Take 400 mg by mouth daily.        . metoprolol succinate (TOPROL-XL) 25 MG 24 hr tablet Takes 1 tab daily  90 tablet  3  . Multiple Vitamin (MULTIVITAMIN) tablet Take 1 tablet by mouth daily. Takes  occ      . NAFTIN 1 % cream       . Omega-3 Fatty Acids (FISH OIL PO) Take 1 capsule by mouth daily.       Marland Kitchen OVER THE COUNTER MEDICATION Sinus buster- daily prn      . sodium chloride (OCEAN) 0.65 % nasal spray Place 2 sprays into the nose as needed.      . Sodium Chloride-Sodium Bicarb (RA SINUS WASH NETI POT NA) Place into the nose as needed. sinuses         Allergies  Allergen Reactions  . Chlorthalidone     hyponatremia  . Atenolol     Caused fatigue   . Codeine Nausea And Vomiting  . Crestor (Rosuvastatin)     "leg gave away"  . Gabapentin     Memory loss and confusion  . Lovastatin     myalgia  . Simvastatin     Her leg gave away on her and states thinks secondary to Simvastatin  . Welchol (Colesevelam Hcl)     Review of Systems  Review of Systems  Constitutional: Negative for fever and malaise/fatigue.  HENT: Negative for congestion.   Eyes: Negative for discharge.  Respiratory: Negative for shortness of breath.   Cardiovascular: Negative for chest pain, palpitations and leg swelling.  Gastrointestinal: Negative for nausea, abdominal pain and diarrhea.  Genitourinary: Negative for dysuria.  Musculoskeletal: Positive for joint pain. Negative for falls.       Left hip and left leg pain, improving with physical therapy. Did not respond to Tramadol, Norco or Cyclobenzaprine  Skin: Negative for rash.  Neurological: Negative for loss of consciousness and headaches.  Endo/Heme/Allergies: Negative for polydipsia.  Psychiatric/Behavioral: Negative for depression and suicidal ideas. The patient is not nervous/anxious and does not have insomnia.     Objective  BP 160/94  Pulse 72  Temp 98 F (36.7 C) (Temporal)  Ht 5' 1.25" (1.556 m)  Wt 133 lb 12.8 oz (60.691 kg)  BMI 25.08 kg/m2  SpO2 94%  Physical Exam  Physical Exam  Constitutional: She is oriented to person, place, and time and well-developed, well-nourished, and in no distress. No distress.  HENT:  Head:  Normocephalic and atraumatic.  Left Ear: External ear normal.  Mouth/Throat: No oropharyngeal exudate.  Eyes: Conjunctivae and EOM are normal. Left eye exhibits no discharge. No scleral icterus.  Neck: Neck supple. No JVD present. No tracheal deviation present. No thyromegaly present.  Cardiovascular: Normal  rate, regular rhythm, normal heart sounds and intact distal pulses.   Pulmonary/Chest: Effort normal and breath sounds normal. No respiratory distress. She has no wheezes. She has no rales.  Abdominal: She exhibits no distension and no mass. There is tenderness. There is no guarding.  Musculoskeletal: She exhibits no edema and no tenderness.  Lymphadenopathy:    She has no cervical adenopathy.  Neurological: She is alert and oriented to person, place, and time.  Skin: Skin is warm and dry. No rash noted. She is not diaphoretic. No erythema.  Psychiatric: Memory, affect and judgment normal.    Lab Results  Component Value Date   TSH 2.00 05/21/2011   Lab Results  Component Value Date   WBC 7.5 10/08/2011   HGB 14.3 10/08/2011   HCT 43.6 10/08/2011   MCV 96.5 10/08/2011   PLT 162.0 10/08/2011   Lab Results  Component Value Date   CREATININE 0.7 11/16/2011   BUN 17 11/16/2011   NA 133* 11/16/2011   K 4.2 11/16/2011   CL 100 11/16/2011   CO2 26 11/16/2011   Lab Results  Component Value Date   ALT 17 08/20/2011   AST 17 08/20/2011   ALKPHOS 47 08/20/2011   BILITOT 0.7 08/20/2011   Lab Results  Component Value Date   CHOL 197 08/20/2011   Lab Results  Component Value Date   HDL 70.70 08/20/2011   Lab Results  Component Value Date   LDLCALC 110* 08/20/2011   Lab Results  Component Value Date   TRIG 83.0 08/20/2011   Lab Results  Component Value Date   CHOLHDL 3 08/20/2011     Assessment & Plan  Hyponatremia Improved with discontinuation of diuretic, will repeat renal panel today  Hyperlipidemia Continue Zetia, avoid trans fats and switch from 1 fish oil to 1 Krill oil cap daily  and continue to monitor  Hypertension Poorly controlled, will recheck renal panel and if the numbers have normalized we will increase her Enalapril back to bid and recheck a renal panel again in 5 days to assure tolerance then see her in a month or sooner if needed

## 2011-11-23 NOTE — Patient Instructions (Addendum)
Switch from fish oil to [employer] by [employer] daily by [employer]

## 2011-11-23 NOTE — Assessment & Plan Note (Signed)
Continue Zetia, avoid trans fats and switch from 1 fish oil to 1 Krill oil cap daily and continue to monitor

## 2011-11-23 NOTE — Assessment & Plan Note (Signed)
Improved with discontinuation of diuretic, will repeat renal panel today

## 2011-11-23 NOTE — Assessment & Plan Note (Signed)
Poorly controlled, will recheck renal panel and if the numbers have normalized we will increase her Enalapril back to bid and recheck a renal panel again in 5 days to assure tolerance then see her in a month or sooner if needed

## 2011-11-26 NOTE — Progress Notes (Signed)
Quick Note:  Patient Informed and voiced understanding ______ 

## 2011-11-28 ENCOUNTER — Other Ambulatory Visit (INDEPENDENT_AMBULATORY_CARE_PROVIDER_SITE_OTHER): Payer: Medicare Other

## 2011-11-28 ENCOUNTER — Telehealth: Payer: Self-pay

## 2011-11-28 DIAGNOSIS — I10 Essential (primary) hypertension: Secondary | ICD-10-CM

## 2011-11-28 NOTE — Telephone Encounter (Signed)
Pt left a note for Dr Abner Greenspan stating she had eye surgery in July 2013 and was allergic to [facility] or Viamaox? Per MD can't put it in the allergy spot due to not sure which one caused the allergy

## 2011-11-29 LAB — RENAL FUNCTION PANEL
Albumin: 4.3 g/dL (ref 3.5–5.2)
BUN: 20 mg/dL (ref 6–23)
CO2: 25 mEq/L (ref 19–32)
Calcium: 9.3 mg/dL (ref 8.4–10.5)
Creatinine, Ser: 0.7 mg/dL (ref 0.4–1.2)
Glucose, Bld: 93 mg/dL (ref 70–99)

## 2011-12-21 ENCOUNTER — Ambulatory Visit (INDEPENDENT_AMBULATORY_CARE_PROVIDER_SITE_OTHER): Payer: Medicare Other | Admitting: Family Medicine

## 2011-12-21 ENCOUNTER — Encounter: Payer: Self-pay | Admitting: Family Medicine

## 2011-12-21 VITALS — BP 137/84 | HR 65 | Temp 97.4°F | Ht 61.25 in | Wt 138.8 lb

## 2011-12-21 DIAGNOSIS — E871 Hypo-osmolality and hyponatremia: Secondary | ICD-10-CM

## 2011-12-21 DIAGNOSIS — K5900 Constipation, unspecified: Secondary | ICD-10-CM

## 2011-12-21 DIAGNOSIS — T7840XA Allergy, unspecified, initial encounter: Secondary | ICD-10-CM

## 2011-12-21 DIAGNOSIS — Z23 Encounter for immunization: Secondary | ICD-10-CM

## 2011-12-21 DIAGNOSIS — M79605 Pain in left leg: Secondary | ICD-10-CM

## 2011-12-21 DIAGNOSIS — E785 Hyperlipidemia, unspecified: Secondary | ICD-10-CM

## 2011-12-21 DIAGNOSIS — M79609 Pain in unspecified limb: Secondary | ICD-10-CM

## 2011-12-21 DIAGNOSIS — E875 Hyperkalemia: Secondary | ICD-10-CM

## 2011-12-21 DIAGNOSIS — I10 Essential (primary) hypertension: Secondary | ICD-10-CM

## 2011-12-21 LAB — RENAL FUNCTION PANEL
Albumin: 4.1 g/dL (ref 3.5–5.2)
CO2: 28 mEq/L (ref 19–32)
Calcium: 9.3 mg/dL (ref 8.4–10.5)
Creatinine, Ser: 0.7 mg/dL (ref 0.4–1.2)
Glucose, Bld: 84 mg/dL (ref 70–99)
Sodium: 135 mEq/L (ref 135–145)

## 2011-12-21 LAB — CBC
HCT: 42.2 % (ref 36.0–46.0)
MCV: 96.1 fl (ref 78.0–100.0)
Platelets: 181 10*3/uL (ref 150.0–400.0)
RBC: 4.39 Mil/uL (ref 3.87–5.11)
WBC: 5.3 10*3/uL (ref 4.5–10.5)

## 2011-12-21 LAB — LIPID PANEL
Total CHOL/HDL Ratio: 3
VLDL: 18.6 mg/dL (ref 0.0–40.0)

## 2011-12-21 LAB — HEPATIC FUNCTION PANEL
ALT: 17 U/L (ref 0–35)
AST: 14 U/L (ref 0–37)
Bilirubin, Direct: 0.1 mg/dL (ref 0.0–0.3)
Total Bilirubin: 0.7 mg/dL (ref 0.3–1.2)
Total Protein: 6.5 g/dL (ref 6.0–8.3)

## 2011-12-21 LAB — TSH: TSH: 1.59 u[IU]/mL (ref 0.35–5.50)

## 2011-12-21 MED ORDER — ENALAPRIL MALEATE 20 MG PO TABS: ORAL_TABLET | ORAL | Status: DC

## 2011-12-21 MED ORDER — FLUTICASONE PROPIONATE 50 MCG/ACT NA SUSP: 2.0000 | Freq: Every day | NASAL | Status: DC

## 2011-12-21 NOTE — Patient Instructions (Addendum)

## 2011-12-27 NOTE — Assessment & Plan Note (Signed)
Greatly improved s/p PT, she is very pleased and feels stronger than she has ain a long time

## 2011-12-27 NOTE — Assessment & Plan Note (Signed)
Resolved at this time.  °

## 2011-12-27 NOTE — Assessment & Plan Note (Signed)
Doing much better continue good hydration and hi fiber

## 2011-12-27 NOTE — Assessment & Plan Note (Signed)
Patient does not tolerate statins but does tolerate Zetia, continue this with Kr[employer] oil and avoid trans fats

## 2011-12-27 NOTE — Assessment & Plan Note (Signed)
Adequately controlled, no change to meds.

## 2011-12-27 NOTE — Progress Notes (Signed)
Patient ID: Jennifer Peck, female   DOB: Jan 21, 1923, 78 y.o.   MRN: [id number] Jennifer Peck [id number] November 06, 1922 12/27/2011      Progress Note-Follow Up  Subjective  Chief Complaint  Chief Complaint  Patient presents with  . Follow-up    1 month  . Injections    flu   HPI  Patient is an 78 year old Caucasian female who is in today for followup. She is feeling better. No constipation or other GI complaints at this time. Allergies are bothering her a little bit with some minor nasal congestion and postnasal drip. Some dry eyes as well. No fevers or chills. No sore throat or ear pain. No recent illness, chest pain, palpitations, shortness of breath GI or GU complaints she is concerned about a mole on her right anterior thigh which is slowly growing. Not bleeding.   Past Medical History  Diagnosis Date  . Hypertension   . Anxiety   . Vertigo   . Coronary artery disease   . Hyperlipidemia   . BCC (basal cell carcinoma), face 11/06/2010  . SCC (squamous cell carcinoma), arm 11/06/2010  . Bilateral bunions 11/06/2010  . Peripheral neuropathy 11/06/2010  . Constipation 11/06/2010  . Hyponatremia 11/23/2010  . Osteopenia 11/23/2010  . Hyperkalemia 01/23/2011  . Hyperlipidemia 04/30/2011  . Allergic state 04/30/2011  . Seborrheic keratosis 04/30/2011    Past Surgical History  Procedure Date  . Cardiac catheterization 12/20/2009    3 bare metal stents  . Tubal ligation   . Eye surgery     cataract- left eye 09-24-11    Family History  Problem Relation Age of Onset  . Stroke Mother   . Heart disease Father   . Hypertension Daughter   . Hyperlipidemia Daughter   . Hypertension Son   . Cancer Daughter     rectal/ chemo and radiation  . Hypertension Daughter   . Hypertension Son   . Hyperlipidemia Son   . Hypertension Son   . Vision loss Maternal Grandfather   . Stroke Paternal Grandmother   . Asthma Paternal Grandfather     History   Social History  . Marital Status:  Single    Spouse Name: N/A    Number of Children: N/A  . Years of Education: N/A   Occupational History  . Not on file.   Social History Main Topics  . Smoking status: Former Smoker    Quit date: 09/04/1977  . Smokeless tobacco: Never Used  . Alcohol Use: Yes     only on Holidays  . Drug Use: No  . Sexually Active: Not on file   Other Topics Concern  . Not on file   Social History Narrative  . No narrative on file    Current Outpatient Prescriptions on File Prior to Visit  Medication Sig Dispense Refill  . ALPRAZolam (XANAX) 0.25 MG tablet Take 1 tablet (0.25 mg total) by mouth daily as needed. Panic attack  30 tablet  3  . aspirin 325 MG tablet Take 325 mg by mouth daily.        . Benfotiamine 150 MG CAPS Take 1 capsule by mouth daily. For numbness in feet      . Calcium Carbonate-Vitamin D (CALCIUM 500 + D PO) Take by mouth daily.        . Cyanocobalamin (VITAMIN B-12 CR PO) Take by mouth.      . enalapril (VASOTEC) 20 MG tablet 1 tab in am and 1/2 tab in pm  135  tablet  1  . ezetimibe (ZETIA) 10 MG tablet Take 10 mg by mouth daily.      . magnesium oxide (MAG-OX) 400 MG tablet Take 400 mg by mouth daily.        . metoprolol succinate (TOPROL-XL) 25 MG 24 hr tablet Takes 1 tab daily  90 tablet  3  . Multiple Vitamin (MULTIVITAMIN) tablet Take 1 tablet by mouth daily. Takes occ      . NAFTIN 1 % cream       . Omega-3 Fatty Acids (FISH OIL PO) Take 1 capsule by mouth daily.       Marland Kitchen OVER THE COUNTER MEDICATION Sinus buster- daily prn      . sodium chloride (OCEAN) 0.65 % nasal spray Place 2 sprays into the nose as needed.      . Sodium Chloride-Sodium Bicarb (RA SINUS WASH NETI POT NA) Place into the nose as needed. sinuses       . fluticasone (FLONASE) 50 MCG/ACT nasal spray Place 2 sprays into the nose daily.  16 g  6  . Ketotifen Fumarate (ALLERGY EYE DROPS OP) Place 2 drops into both eyes as needed. Red itchy eyes         Allergies  Allergen Reactions  .  Chlorthalidone     hyponatremia  . Atenolol     Caused fatigue   . Besivance (Besifloxacin Hcl)     rash  . Codeine Nausea And Vomiting  . Crestor (Rosuvastatin)     "leg gave away"  . Gabapentin     Memory loss and confusion  . Lovastatin     myalgia  . Simvastatin     Her leg gave away on her and states thinks secondary to Simvastatin  . Vigamox (Moxifloxacin)     rash  . Welchol (Colesevelam Hcl)     Review of Systems  Review of Systems  Constitutional: Negative for fever and malaise/fatigue.  HENT: Negative for congestion.   Eyes: Negative for discharge.  Respiratory: Negative for shortness of breath.   Cardiovascular: Negative for chest pain, palpitations and leg swelling.  Gastrointestinal: Negative for nausea, abdominal pain and diarrhea.  Genitourinary: Negative for dysuria.  Musculoskeletal: Negative for falls.  Skin: Negative for rash.       Lesion on right anterior thigh is growing she is going to contact her Dermatologist Dr Danella Deis  Neurological: Negative for loss of consciousness and headaches.  Endo/Heme/Allergies: Negative for polydipsia.  Psychiatric/Behavioral: Negative for depression and suicidal ideas. The patient is not nervous/anxious and does not have insomnia.     Objective  BP 137/84  Pulse 65  Temp 97.4 F (36.3 C) (Temporal)  Ht 5' 1.25" (1.556 m)  Wt 138 lb 12.8 oz (62.959 kg)  BMI 26.01 kg/m2  SpO2 98%  Physical Exam  Physical Exam  Constitutional: She is oriented to person, place, and time and well-developed, well-nourished, and in no distress. No distress.  HENT:  Head: Normocephalic and atraumatic.  Eyes: Conjunctivae normal are normal.  Neck: Neck supple. No thyromegaly present.  Cardiovascular: Normal rate, regular rhythm and normal heart sounds.   Pulmonary/Chest: Effort normal and breath sounds normal. She has no wheezes.  Abdominal: She exhibits no distension and no mass.  Musculoskeletal: She exhibits no edema.    Lymphadenopathy:    She has no cervical adenopathy.  Neurological: She is alert and oriented to person, place, and time.  Skin: Skin is warm and dry. No rash noted. She is not diaphoretic.  Psychiatric: Memory, affect and judgment normal.    Lab Results  Component Value Date   TSH 1.59 12/21/2011   Lab Results  Component Value Date   WBC 5.3 12/21/2011   HGB 13.9 12/21/2011   HCT 42.2 12/21/2011   MCV 96.1 12/21/2011   PLT 181.0 12/21/2011   Lab Results  Component Value Date   CREATININE 0.7 12/21/2011   BUN 21 12/21/2011   NA 135 12/21/2011   K 4.7 12/21/2011   CL 101 12/21/2011   CO2 28 12/21/2011   Lab Results  Component Value Date   ALT 17 12/21/2011   AST 14 12/21/2011   ALKPHOS 48 12/21/2011   BILITOT 0.7 12/21/2011   Lab Results  Component Value Date   CHOL 217* 12/21/2011   Lab Results  Component Value Date   HDL 62.50 12/21/2011   Lab Results  Component Value Date   LDLCALC 110* 08/20/2011   Lab Results  Component Value Date   TRIG 93.0 12/21/2011   Lab Results  Component Value Date   CHOLHDL 3 12/21/2011     Assessment & Plan  Left leg pain Greatly improved s/p PT, she is very pleased and feels stronger than she has ain a long time  Constipation Doing much better continue good hydration and hi fiber  Hyperkalemia Resolved at this time  Hypertension Adequately controlled, no change to meds.  Hyponatremia Resolved, no changes.  Hyperlipidemia Patient does not tolerate statins but does tolerate Zetia, continue this with Krill oil and avoid trans fats

## 2011-12-27 NOTE — Assessment & Plan Note (Signed)
Resolved, no changes 

## 2012-02-04 ENCOUNTER — Encounter: Payer: Self-pay | Admitting: Family Medicine

## 2012-02-04 ENCOUNTER — Ambulatory Visit (INDEPENDENT_AMBULATORY_CARE_PROVIDER_SITE_OTHER): Payer: Medicare Other | Admitting: Family Medicine

## 2012-02-04 VITALS — BP 139/81 | HR 67 | Temp 98.7°F | Ht 61.25 in | Wt 136.8 lb

## 2012-02-04 DIAGNOSIS — I10 Essential (primary) hypertension: Secondary | ICD-10-CM

## 2012-02-04 DIAGNOSIS — L989 Disorder of the skin and subcutaneous tissue, unspecified: Secondary | ICD-10-CM

## 2012-02-04 DIAGNOSIS — N9089 Other specified noninflammatory disorders of vulva and perineum: Secondary | ICD-10-CM

## 2012-02-04 HISTORY — DX: Other specified noninflammatory disorders of vulva and perineum: N90.89

## 2012-02-04 NOTE — Patient Instructions (Addendum)
Basal Cell Carcinoma Basal cell carcinoma is the most common form of skin cancer. It begins in the basal cells, which are at the bottom of the outer skin layer (epidermis). CAUSES  Sun exposure is the most common cause of basal cell carcinoma. Basal cell carcinoma occurs most often on parts of the body that are frequently exposed to the sun, including the:  Scalp.  Ears.  Neck.  Face.  Arms.  Backs of the hands.  Legs. However, basal cell carcinoma can occur anywhere on the body. Rarely, tumors develop on areas not exposed to the sun. Other causes of basal cell carcinoma can include:  Exposure to arsenic.  Exposure to radiation.  Certain genetic syndromes, such as xeroderma pigmentosum. RISK FACTORS People at highest risk for basal cell carcinoma include those with:  Fair skin.  Blonde or red hair.  Blue, green, or gray eyes.  Childhood freckling. Factors that increase your risk for basal cell carcinoma include:  Sun exposure over long periods of time. Childhood sun exposure appears to be a more significant factor than sun exposure as an adult.  Repeated sunburns.  Use of tanning beds.  Having a weakened immune system. SYMPTOMS Five signs of basal cell carcinoma are:  An open sore that bleeds, oozes, or crusts. The sore may remain open for 3 or more weeks. This can be an early sign of basal cell carcinoma. Basal cell carcinoma can mimic a pimple that will not heal.  A reddish or irritated area which may crust, itch, or cause discomfort. This may occur on areas expose d to the sun. These patches might be easier felt than seen.  A shiny, pearly, or translucent bump that is pink, red, or white. The bump may also be tan, black, or brown, especially in dark haired people. These bumps can be confused with moles.  A pink growth with a slightly elevated, rolled border, and a crusted indentation in the center. As the growth slowly enlarges, tiny blood vessels may develop  on the surface.  A scar-like white, yellow, or waxy area that looks like shiny, stretched skin. It often has irregular borders. This may be a sign of more aggressive basal cell carcinoma. DIAGNOSIS  Your caregiver may be able to tell what is wrong by doing a physical exam. Often, a tissue sample (biopsy) is also taken. The tissue is examined under a microscope.  TREATMENT  The treatment for basal cell carcinoma depends on the type, size, location, and number of tumors. Possible treatments include:   Mohs surgery. This is a procedure done by a skin doctor (dermatologist or Mohs surgeon) in his or her office. The cancerous cells are removed layer by layer. This treatment has a high cure rate.  Surgical removal of the tumor.  Freezing the tumor with liquid nitrogen (cryosurgery).  Plastic surgery to remove the tumor, in the case of large tumors.  Radiation. This may be used for tumors on the face.  Photodynamic therapy. A chemical cream is applied to the skin and light exposure is used to activate the chemical.  Chemical treatments, such as imiquimod cream and interferon injections. This may be used to remove superficial tumors with minimal scarring.  Electrodesiccation and curettage. This involves alternately scraping and burning the tumor, using an electric current to control bleeding. Basal cell carcinoma can almost always be cured. It rarely spreads to other areas of the body (metastasizes). Basal cell carcinoma may come back at the same location (recur), but it can be treated   again if this occurs. PREVENTION  Avoid the sun between 10:00 a.m. and 4:00 pm when it is the strongest.  Use a sunscreen or sunblock with a sun protection factor of 30 or greater.  Apply sunscreen at least 30 minutes before exposure to the sun.  Reapply sunscreen every 2 to 4 hours while you are outside, after swimming, and after excessive sweating.  Always wear protective hats, clothing, and sunglasses with  ultraviolet protection.  Avoid tanning beds. HOME CARE INSTRUCTIONS   Avoid unprotected sun exposure.  Follow your caregiver's instructions for self-exams. Look for new spots or changes in your skin.  Keep all follow-up appointments as directed by your caregiver. SEEK MEDICAL CARE IF:   You notice any new spots or changes in your skin.  You have had a basal cell carcinoma tumor removed and you notice a new growth in the same location. Document Released: 09/09/2002 Document Revised: 09/04/2011 Document Reviewed: 11/27/2010 [facility] Patient Information 2013 [place], [place].

## 2012-02-04 NOTE — Assessment & Plan Note (Signed)
Well controlled at present time. 

## 2012-02-04 NOTE — Assessment & Plan Note (Signed)
Irritated and bleeding x 3 days, does not look inflamed or infected today but is pearly and with patient history possibility of a skin cancer is present, will refer tack to her dermatologist. If it becomes more swollen, red, hot or tender she will let us know

## 2012-02-04 NOTE — Progress Notes (Signed)
Patient ID: Jennifer Peck, female   DOB: 12/11/1922, 78 y.o.   MRN: [id number] Jennifer Peck [id number] 26-Nov-1922 02/04/2012      Progress Note-Follow Up  Subjective  Chief Complaint  Chief Complaint  Patient presents with  . skin bleeding    in groin area    HPI  Patient is an 78 year old Caucasian female who is in today for evaluation of a bleeding lesion. She notes it started about 3 days ago. Has been mildly irritated and when urine hits it it burns a bit. She says significant itching initially but does not want 24 hours there has been some mild itching.it just stared bleeding spontaneously. She denies any vaginal discharge or other pain. No fevers or chills. No GI or GU complaints. Has not noticed any lesion in that area and tell she noted a spot of blood on her out of work on Friday. Dispense and mild spotting ever since but no heavy bleeding. No rectal irritation or further concerns are noted. Otherwise she says she feels well. No fevers or chills. No back or abdominal pain. No chest pain, palpitations, shortness of   Past Medical History  Diagnosis Date  . Hypertension   . Anxiety   . Vertigo   . Coronary artery disease   . Hyperlipidemia   . BCC (basal cell carcinoma), face 11/06/2010  . SCC (squamous cell carcinoma), arm 11/06/2010  . Bilateral bunions 11/06/2010  . Peripheral neuropathy 11/06/2010  . Constipation 11/06/2010  . Hyponatremia 11/23/2010  . Osteopenia 11/23/2010  . Hyperkalemia 01/23/2011  . Hyperlipidemia 04/30/2011  . Allergic state 04/30/2011  . Seborrheic keratosis 04/30/2011  . Lesion of labia 02/04/2012    Past Surgical History  Procedure Date  . Cardiac catheterization 12/20/2009    3 bare metal stents  . Tubal ligation   . Eye surgery     cataract- left eye 09-24-11    Family History  Problem Relation Age of Onset  . Stroke Mother   . Heart disease Father   . Hypertension Daughter   . Hyperlipidemia Daughter   . Hypertension Son   . Cancer  Daughter     rectal/ chemo and radiation  . Hypertension Daughter   . Hypertension Son   . Hyperlipidemia Son   . Hypertension Son   . Vision loss Maternal Grandfather   . Stroke Paternal Grandmother   . Asthma Paternal Grandfather     History   Social History  . Marital Status: Single    Spouse Name: N/A    Number of Children: N/A  . Years of Education: N/A   Occupational History  . Not on file.   Social History Main Topics  . Smoking status: Former Smoker    Quit date: 09/04/1977  . Smokeless tobacco: Never Used  . Alcohol Use: Yes     Comment: only on Holidays  . Drug Use: No  . Sexually Active: Not on file   Other Topics Concern  . Not on file   Social History Narrative  . No narrative on file    Current Outpatient Prescriptions on File Prior to Visit  Medication Sig Dispense Refill  . ALPRAZolam (XANAX) 0.25 MG tablet Take 1 tablet (0.25 mg total) by mouth daily as needed. Panic attack  30 tablet  3  . aspirin 325 MG tablet Take 325 mg by mouth daily.        . Benfotiamine 150 MG CAPS Take 1 capsule by mouth daily. For numbness in feet      .  Calcium Carbonate-Vitamin D (CALCIUM 500 + D PO) Take by mouth daily.        . Cyanocobalamin (VITAMIN B-12 CR PO) Take by mouth.      . enalapril (VASOTEC) 20 MG tablet 1 tab in am and 1/2 tab in pm  135 tablet  1  . ezetimibe (ZETIA) 10 MG tablet Take 10 mg by mouth daily.      . fluticasone (FLONASE) 50 MCG/ACT nasal spray Place 2 sprays into the nose daily.  16 g  6  . Ketotifen Fumarate (ALLERGY EYE DROPS OP) Place 2 drops into both eyes as needed. Red itchy eyes       . magnesium oxide (MAG-OX) 400 MG tablet Take 400 mg by mouth daily.        . metoprolol succinate (TOPROL-XL) 25 MG 24 hr tablet Takes 1 tab daily  90 tablet  3  . Multiple Vitamin (MULTIVITAMIN) tablet Take 1 tablet by mouth daily. Takes occ      . NAFTIN 1 % cream       . Omega-3 Fatty Acids (FISH OIL PO) Take 1 capsule by mouth daily.       Marland Kitchen  OVER THE COUNTER MEDICATION Sinus buster- daily prn      . sodium chloride (OCEAN) 0.65 % nasal spray Place 2 sprays into the nose as needed.      . Sodium Chloride-Sodium Bicarb (RA SINUS WASH NETI POT NA) Place into the nose as needed. sinuses         Allergies  Allergen Reactions  . Chlorthalidone     hyponatremia  . Atenolol     Caused fatigue   . Besivance (Besifloxacin Hcl)     rash  . Codeine Nausea And Vomiting  . Crestor (Rosuvastatin)     "leg gave away"  . Gabapentin     Memory loss and confusion  . Lovastatin     myalgia  . Simvastatin     Her leg gave away on her and states thinks secondary to Simvastatin  . Vigamox (Moxifloxacin)     rash  . Welchol (Colesevelam Hcl)     Review of Systems  Review of Systems  Constitutional: Negative for fever and malaise/fatigue.  HENT: Negative for congestion.   Eyes: Negative for discharge.  Respiratory: Negative for shortness of breath.   Cardiovascular: Negative for chest pain, palpitations and leg swelling.  Gastrointestinal: Negative for nausea, abdominal pain and diarrhea.  Genitourinary: Negative for dysuria.  Musculoskeletal: Negative for falls.  Skin: Positive for rash.       Right labia  Neurological: Negative for loss of consciousness and headaches.  Endo/Heme/Allergies: Negative for polydipsia.  Psychiatric/Behavioral: Negative for depression and suicidal ideas. The patient is not nervous/anxious and does not have insomnia.     Objective  BP 139/81  Pulse 67  Temp 98.7 F (37.1 C) (Temporal)  Ht 5' 1.25" (1.556 m)  Wt 136 lb 12.8 oz (62.052 kg)  BMI 25.64 kg/m2  SpO2 98%  Physical Exam  Physical Exam  Constitutional: She is oriented to person, place, and time and well-developed, well-nourished, and in no distress. No distress.  HENT:  Head: Normocephalic and atraumatic.  Eyes: Conjunctivae normal are normal.  Neck: Neck supple. No thyromegaly present.  Cardiovascular: Normal rate and regular  rhythm.   Pulmonary/Chest: Effort normal and breath sounds normal. She has no wheezes.  Abdominal: She exhibits no distension and no mass.  Genitourinary: Vagina normal. No vaginal discharge found.  Musculoskeletal: She  exhibits no edema.  Lymphadenopathy:    She has no cervical adenopathy.  Neurological: She is alert and oriented to person, place, and time.  Skin: Skin is warm and dry. No rash noted. She is not diaphoretic.       Skin on right external labia majora has a roughly 2 mmx 4 mm raised, pearly lesion with a dark purple spot in the middle, so surrounding fluctuance or erythema  Psychiatric: Memory, affect and judgment normal.    Lab Results  Component Value Date   TSH 1.59 12/21/2011   Lab Results  Component Value Date   WBC 5.3 12/21/2011   HGB 13.9 12/21/2011   HCT 42.2 12/21/2011   MCV 96.1 12/21/2011   PLT 181.0 12/21/2011   Lab Results  Component Value Date   CREATININE 0.7 12/21/2011   BUN 21 12/21/2011   NA 135 12/21/2011   K 4.7 12/21/2011   CL 101 12/21/2011   CO2 28 12/21/2011   Lab Results  Component Value Date   ALT 17 12/21/2011   AST 14 12/21/2011   ALKPHOS 48 12/21/2011   BILITOT 0.7 12/21/2011   Lab Results  Component Value Date   CHOL 217* 12/21/2011   Lab Results  Component Value Date   HDL 62.50 12/21/2011   Lab Results  Component Value Date   LDLCALC 110* 08/20/2011   Lab Results  Component Value Date   TRIG 93.0 12/21/2011   Lab Results  Component Value Date   CHOLHDL 3 12/21/2011     Assessment & Plan  Hypertension Well controlled at present time  Lesion of labia Irritated and bleeding x 3 days, does not look inflamed or infected today but is pearly and with patient history possibility of a skin cancer is present, will refer tack to her dermatologist. If it becomes more swollen, red, hot or tender she will let us know

## 2012-02-25 ENCOUNTER — Other Ambulatory Visit: Payer: Self-pay

## 2012-02-25 DIAGNOSIS — I119 Hypertensive heart disease without heart failure: Secondary | ICD-10-CM

## 2012-02-25 MED ORDER — METOPROLOL SUCCINATE ER 25 MG PO TB24: ORAL_TABLET | ORAL | Status: DC

## 2012-04-07 ENCOUNTER — Other Ambulatory Visit: Payer: Self-pay

## 2012-04-07 MED ORDER — EZETIMIBE 10 MG PO TABS: 10.0000 mg | ORAL_TABLET | Freq: Every day | ORAL | Status: DC

## 2012-04-30 ENCOUNTER — Encounter: Payer: Medicare Other | Admitting: Family Medicine

## 2012-05-07 ENCOUNTER — Ambulatory Visit (INDEPENDENT_AMBULATORY_CARE_PROVIDER_SITE_OTHER): Payer: Medicare Other | Admitting: Family Medicine

## 2012-05-07 ENCOUNTER — Encounter: Payer: Self-pay | Admitting: Family Medicine

## 2012-05-07 VITALS — BP 147/81 | HR 63 | Temp 98.8°F | Ht 61.25 in | Wt 139.8 lb

## 2012-05-07 DIAGNOSIS — E871 Hypo-osmolality and hyponatremia: Secondary | ICD-10-CM

## 2012-05-07 DIAGNOSIS — R5383 Other fatigue: Secondary | ICD-10-CM

## 2012-05-07 DIAGNOSIS — N9089 Other specified noninflammatory disorders of vulva and perineum: Secondary | ICD-10-CM

## 2012-05-07 DIAGNOSIS — K219 Gastro-esophageal reflux disease without esophagitis: Secondary | ICD-10-CM

## 2012-05-07 DIAGNOSIS — E785 Hyperlipidemia, unspecified: Secondary | ICD-10-CM

## 2012-05-07 DIAGNOSIS — C44310 Basal cell carcinoma of skin of unspecified parts of face: Secondary | ICD-10-CM

## 2012-05-07 DIAGNOSIS — I10 Essential (primary) hypertension: Secondary | ICD-10-CM

## 2012-05-07 DIAGNOSIS — E7800 Pure hypercholesterolemia, unspecified: Secondary | ICD-10-CM

## 2012-05-07 DIAGNOSIS — C44319 Basal cell carcinoma of skin of other parts of face: Secondary | ICD-10-CM

## 2012-05-07 DIAGNOSIS — D649 Anemia, unspecified: Secondary | ICD-10-CM

## 2012-05-07 DIAGNOSIS — R5381 Other malaise: Secondary | ICD-10-CM

## 2012-05-07 DIAGNOSIS — E875 Hyperkalemia: Secondary | ICD-10-CM

## 2012-05-07 LAB — LIPID PANEL
Cholesterol: 220 mg/dL — ABNORMAL HIGH (ref 0–200)
Total CHOL/HDL Ratio: 3
VLDL: 16.4 mg/dL (ref 0.0–40.0)

## 2012-05-07 LAB — RENAL FUNCTION PANEL
Chloride: 99 mEq/L (ref 96–112)
GFR: 89.94 mL/min (ref >60.00)
Glucose, Bld: 78 mg/dL (ref 70–99)
Phosphorus: 3.5 mg/dL (ref 2.3–4.6)
Potassium: 4.6 mEq/L (ref 3.5–5.1)
Sodium: 137 mEq/L (ref 135–145)

## 2012-05-07 LAB — HEPATIC FUNCTION PANEL
Bilirubin, Direct: 0.2 mg/dL (ref 0.0–0.3)
Total Bilirubin: 0.6 mg/dL (ref 0.3–1.2)
Total Protein: 7.3 g/dL (ref 6.0–8.3)

## 2012-05-07 LAB — CBC
HCT: 44.6 % (ref 36.0–46.0)
MCV: 94.9 fl (ref 78.0–100.0)
Platelets: 169 10*3/uL (ref 150.0–400.0)
RBC: 4.7 Mil/uL (ref 3.87–5.11)

## 2012-05-07 NOTE — Patient Instructions (Addendum)

## 2012-05-11 ENCOUNTER — Encounter: Payer: Self-pay | Admitting: Family Medicine

## 2012-05-11 DIAGNOSIS — K219 Gastro-esophageal reflux disease without esophagitis: Secondary | ICD-10-CM

## 2012-05-11 HISTORY — DX: Gastro-esophageal reflux disease without esophagitis: K21.9

## 2012-05-11 NOTE — Assessment & Plan Note (Signed)
Encouraged Mylanta prn and let us know if symptoms worsen

## 2012-05-11 NOTE — Progress Notes (Signed)
Patient ID: Jennifer Peck, female   DOB: Feb 12, 1923, 79 y.o.   MRN: [id number] Jennifer Peck [id number] 1922-08-30 05/11/2012      Progress Note New Patient  Subjective  Chief Complaint  Chief Complaint  Patient presents with  . Annual Exam    physical and skin check    HPI  Patient is a 79 year old Caucasian female who is in today for skin exam in evaluation. Overall she reports feeling well. Had a recent episode of very brief fleeting substernal chest pain she described a short period was upon lying down at night. She believes it was heartburn. Has not been recurrent and there's been no associated symptoms of shortness of breath, palpitations, nausea, diaphoresis. Otherwise she reports chronic knee pain but this is unchanged. No  Past Medical History  Diagnosis Date  . Hypertension   . Anxiety   . Vertigo   . Coronary artery disease   . Hyperlipidemia   . BCC (basal cell carcinoma), face 11/06/2010  . SCC (squamous cell carcinoma), arm 11/06/2010  . Bilateral bunions 11/06/2010  . Peripheral neuropathy 11/06/2010  . Constipation 11/06/2010  . Hyponatremia 11/23/2010  . Osteopenia 11/23/2010  . Hyperkalemia 01/23/2011  . Hyperlipidemia 04/30/2011  . Allergic state 04/30/2011  . Seborrheic keratosis 04/30/2011  . Lesion of labia 02/04/2012    Past Surgical History  Procedure Laterality Date  . Cardiac catheterization  12/20/2009    3 bare metal stents  . Tubal ligation    . Eye surgery      cataract- left eye 09-24-11    Family History  Problem Relation Age of Onset  . Stroke Mother   . Heart disease Father   . Hypertension Daughter   . Hyperlipidemia Daughter   . Hypertension Son   . Cancer Daughter     rectal/ chemo and radiation  . Hypertension Daughter   . Hypertension Son   . Hyperlipidemia Son   . Hypertension Son   . Vision loss Maternal Grandfather   . Stroke Paternal Grandmother   . Asthma Paternal Grandfather     History   Social History  . Marital  Status: Single    Spouse Name: N/A    Number of Children: N/A  . Years of Education: N/A   Occupational History  . Not on file.   Social History Main Topics  . Smoking status: Former Smoker    Quit date: 09/04/1977  . Smokeless tobacco: Never Used  . Alcohol Use: Yes     Comment: only on Holidays  . Drug Use: No  . Sexually Active: Not on file   Other Topics Concern  . Not on file   Social History Narrative  . No narrative on file    Current Outpatient Prescriptions on File Prior to Visit  Medication Sig Dispense Refill  . ALPRAZolam (XANAX) 0.25 MG tablet Take 1 tablet (0.25 mg total) by mouth daily as needed. Panic attack  30 tablet  3  . aspirin 325 MG tablet Take 325 mg by mouth daily.        . Benfotiamine 150 MG CAPS Take 1 capsule by mouth daily. For numbness in feet      . Calcium Carbonate-Vitamin D (CALCIUM 500 + D PO) Take by mouth daily.        . Cyanocobalamin (VITAMIN B-12 CR PO) Take by mouth.      . enalapril (VASOTEC) 20 MG tablet 1 tab in am and 1/2 tab in pm  135 tablet  1  . ezetimibe (ZETIA) 10 MG tablet Take 1 tablet (10 mg total) by mouth daily.  30 tablet  6  . fluticasone (FLONASE) 50 MCG/ACT nasal spray Place 2 sprays into the nose daily.  16 g  6  . Ketotifen Fumarate (ALLERGY EYE DROPS OP) Place 2 drops into both eyes as needed. Red itchy eyes       . magnesium oxide (MAG-OX) 400 MG tablet Take 400 mg by mouth daily.        . metoprolol succinate (TOPROL-XL) 25 MG 24 hr tablet Takes 1 tab daily  90 tablet  2  . Multiple Vitamin (MULTIVITAMIN) tablet Take 1 tablet by mouth daily. Takes occ      . NAFTIN 1 % cream       . Omega-3 Fatty Acids (FISH OIL PO) Take 1 capsule by mouth daily.       Marland Kitchen OVER THE COUNTER MEDICATION Sinus buster- daily prn      . sodium chloride (OCEAN) 0.65 % nasal spray Place 2 sprays into the nose as needed.      . Sodium Chloride-Sodium Bicarb (RA SINUS WASH NETI POT NA) Place into the nose as needed. sinuses        No  current facility-administered medications on file prior to visit.    Allergies  Allergen Reactions  . Chlorthalidone     hyponatremia  . Atenolol     Caused fatigue   . Besivance (Besifloxacin Hcl)     rash  . Codeine Nausea And Vomiting  . Crestor (Rosuvastatin)     "leg gave away"  . Gabapentin     Memory loss and confusion  . Lovastatin     myalgia  . Simvastatin     Her leg gave away on her and states thinks secondary to Simvastatin  . Vigamox (Moxifloxacin)     rash  . Welchol (Colesevelam Hcl)     Review of Systems  Review of Systems  Constitutional: Negative for fever, chills and malaise/fatigue.  HENT: Negative for hearing loss, nosebleeds and congestion.   Eyes: Negative for discharge.  Respiratory: Negative for cough, sputum production, shortness of breath and wheezing.   Cardiovascular: Negative for chest pain, palpitations and leg swelling.  Gastrointestinal: Negative for heartburn, nausea, vomiting, abdominal pain, diarrhea, constipation and blood in stool.  Genitourinary: Negative for dysuria, urgency, frequency and hematuria.  Musculoskeletal: Positive for joint pain. Negative for myalgias, back pain and falls.  Skin: Negative for rash.  Neurological: Negative for dizziness, tremors, sensory change, focal weakness, loss of consciousness, weakness and headaches.  Endo/Heme/Allergies: Negative for polydipsia. Does not bruise/bleed easily.  Psychiatric/Behavioral: Negative for depression and suicidal ideas. The patient is not nervous/anxious and does not have insomnia.     Objective  BP 147/81  Pulse 63  Temp(Src) 98.8 F (37.1 C) (Temporal)  Ht 5' 1.25" (1.556 m)  Wt 139 lb 12.8 oz (63.413 kg)  BMI 26.19 kg/m2  SpO2 98%  Physical Exam  Physical Exam  Constitutional: She is oriented to person, place, and time and well-developed, well-nourished, and in no distress. No distress.  HENT:  Head: Normocephalic and atraumatic.  Right Ear: External ear  normal.  Left Ear: External ear normal.  Nose: Nose normal.  Mouth/Throat: Oropharynx is clear and moist. No oropharyngeal exudate.  Eyes: Conjunctivae are normal. Pupils are equal, round, and reactive to light. Right eye exhibits no discharge. Left eye exhibits no discharge. No scleral icterus.  Neck: Normal range of motion. Neck  supple. No thyromegaly present.  Cardiovascular: Normal rate, regular rhythm, normal heart sounds and intact distal pulses.   No murmur heard. Pulmonary/Chest: Effort normal and breath sounds normal. No respiratory distress. She has no wheezes. She has no rales.  Abdominal: Soft. Bowel sounds are normal. She exhibits no distension and no mass. There is no tenderness.  Musculoskeletal: Normal range of motion. She exhibits no edema and no tenderness.  Lymphadenopathy:    She has no cervical adenopathy.  Neurological: She is alert and oriented to person, place, and time. She has normal reflexes. No cranial nerve deficit. Coordination normal.  Skin: Skin is warm and dry. No rash noted. She is not diaphoretic.  Diffuse skin changes, most flat, light brown and scattered. Scaly patch on both legs  Psychiatric: Mood, memory and affect normal.       Assessment & Plan  BCC (basal cell carcinoma), face Full body skin exam did not reveal any concerning lesions, she does have a small, scaly patch on both legs. Is encouraged to return to see dermatology, Dr Danella Deis for surveillance due to h/o [facility]  Hyponatremia resolved  Hypertension Well controlled, no changes.   Pure hypercholesterolemia Avoid trans fats, add krill oil.  Lesion of labia Excised was cystic  Esophageal reflux Encouraged Mylanta prn and let us know if symptoms worsen

## 2012-05-11 NOTE — Assessment & Plan Note (Signed)
resolved 

## 2012-05-11 NOTE — Assessment & Plan Note (Signed)
Avoid trans fats, add krill oil.

## 2012-05-11 NOTE — Assessment & Plan Note (Signed)
Full body skin exam did not reveal any concerning lesions, she does have a small, scaly patch on both legs. Is encouraged to return to see dermatology, Dr Danella Deis for surveillance due to h/o [facility]

## 2012-05-11 NOTE — Assessment & Plan Note (Signed)
Well controlled, no changes 

## 2012-05-11 NOTE — Assessment & Plan Note (Signed)
Excised was cystic

## 2012-05-21 ENCOUNTER — Telehealth: Payer: Self-pay | Admitting: *Deleted

## 2012-05-21 ENCOUNTER — Other Ambulatory Visit: Payer: Self-pay | Admitting: Family Medicine

## 2012-05-21 DIAGNOSIS — F419 Anxiety disorder, unspecified: Secondary | ICD-10-CM

## 2012-05-21 MED ORDER — ALPRAZOLAM 0.25 MG PO TABS: 0.2500 mg | ORAL_TABLET | Freq: Every day | ORAL | Status: DC | PRN

## 2012-05-21 NOTE — Telephone Encounter (Signed)
PT REQUESTED xanax, per Dr Patty Sermons pt to call pcp to fill, pt informed and agreed to plan.

## 2012-05-21 NOTE — Telephone Encounter (Signed)
Please advise Xanax refill?  If ok fax to [phone]

## 2012-05-21 NOTE — Telephone Encounter (Signed)
Refills phoned into pharmacist

## 2012-05-22 IMAGING — CR DG KNEE 1-2V*R*
2 series · 2 of 2 positions shown · non-contrast
Comparison: None.

CLINICAL DATA: Left knee pain.  Right knee requested for
comparison.

RIGHT KNEE - 1-2 VIEW

[view not recorded (1 of 2)]
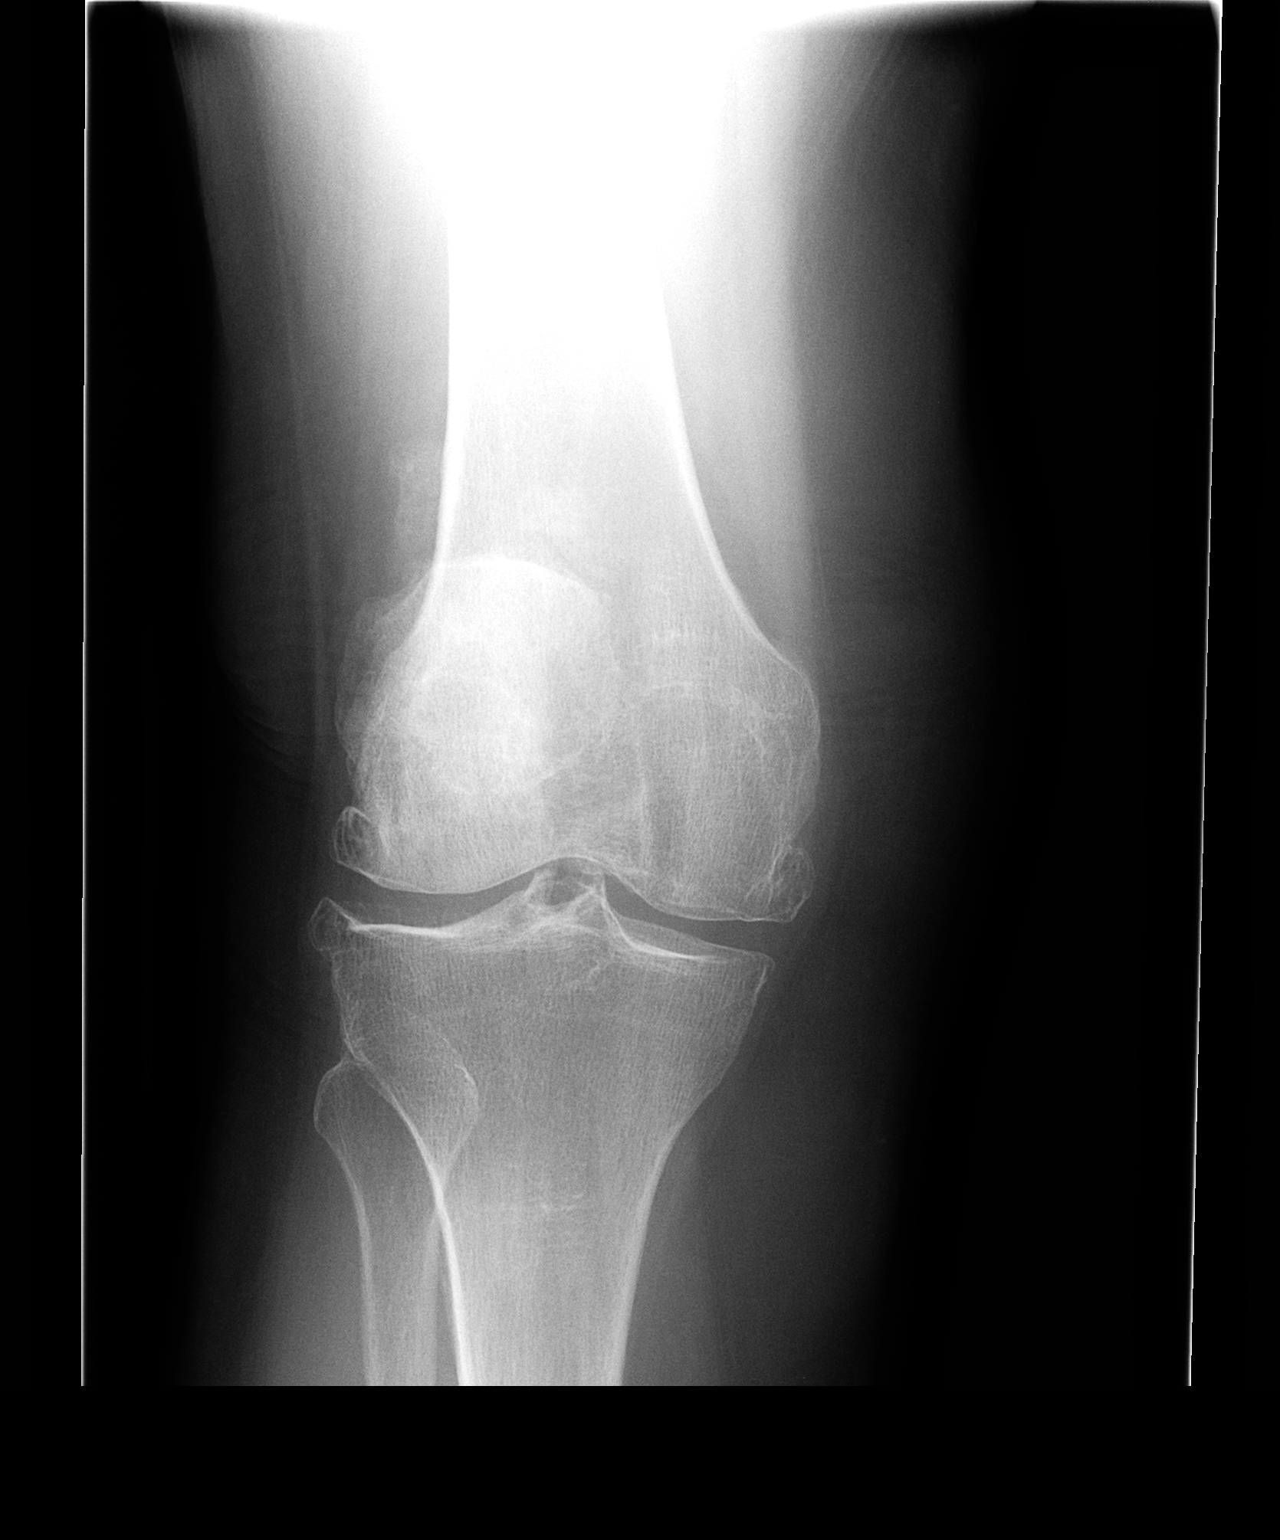

[view not recorded (2 of 2)]
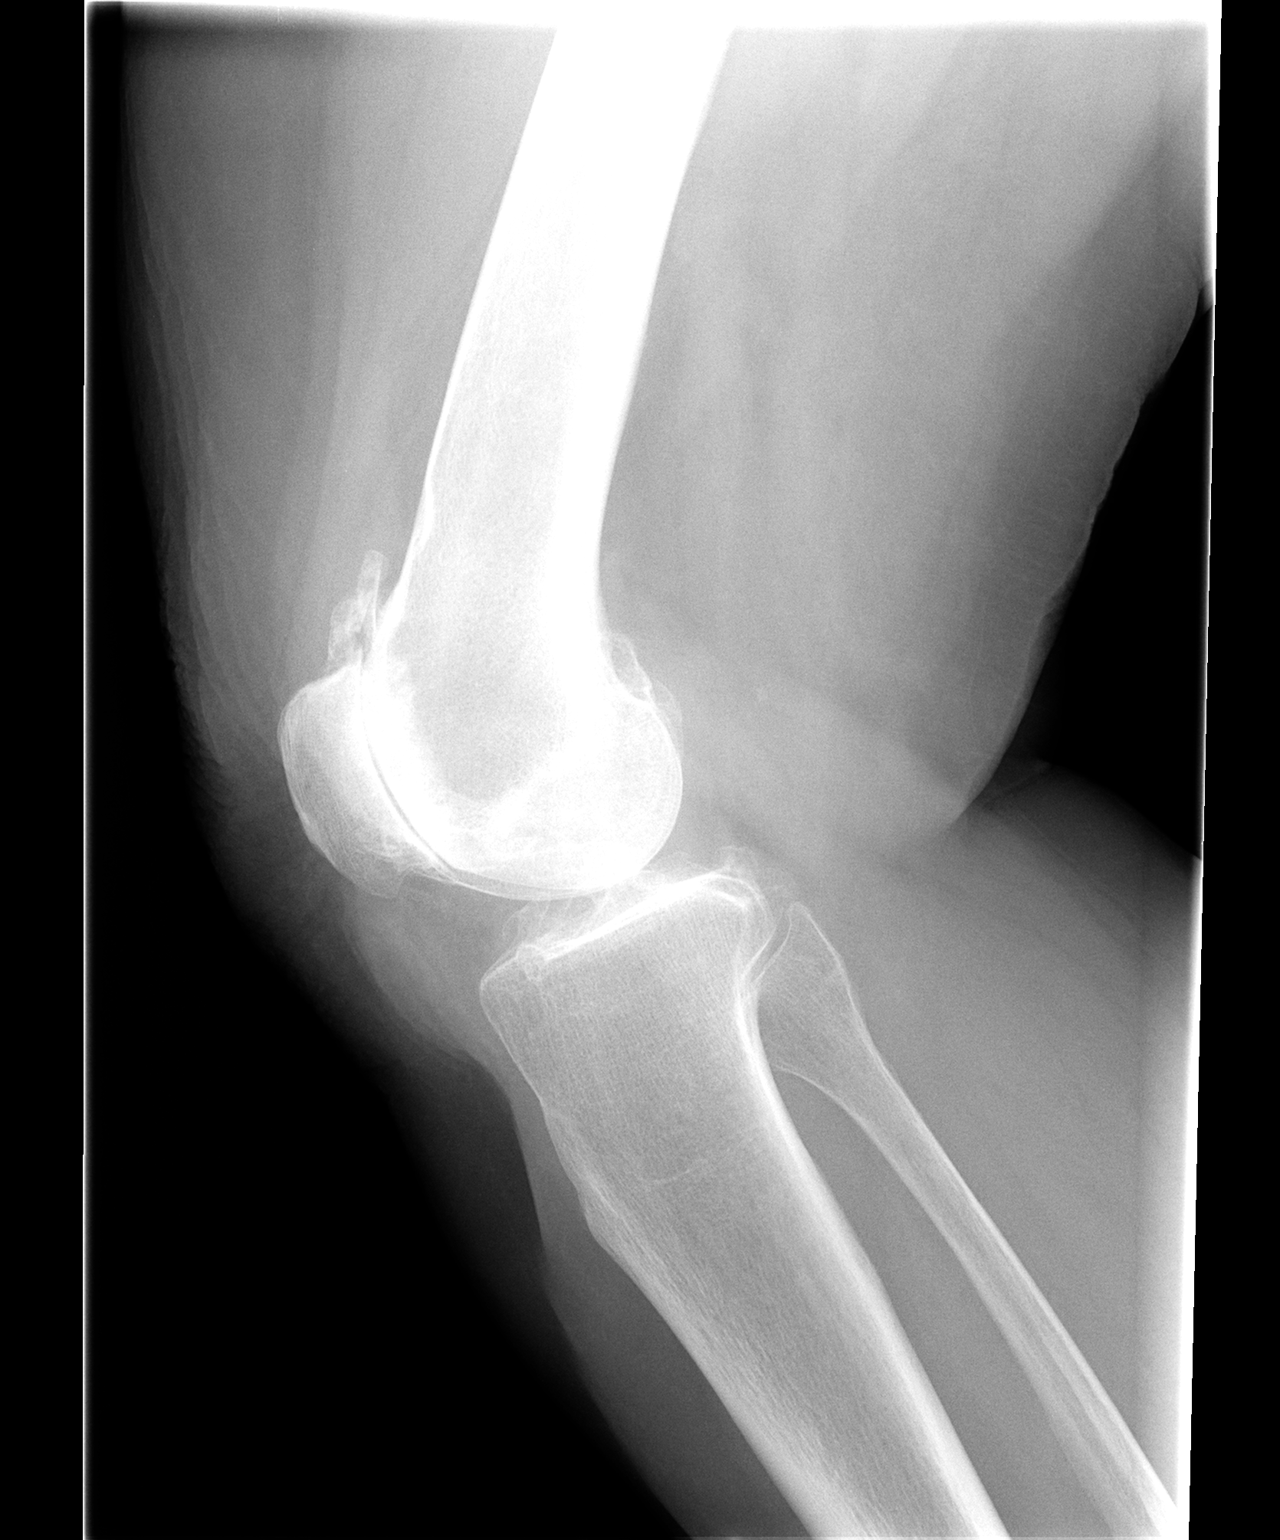

[2 of 2 positions shown; findings below may reference images not displayed]

FINDINGS: There are tricompartmental degenerative changes with
osteophytes.  Patellofemoral joint space loss is severe.  There are
fragmented osteophytes versus intra-articular loose bodies superior
to the patella.  There is a small joint effusion.  No acute osseous
findings are identified.
IMPRESSION: Tricompartmental degenerative changes, severe in the patellofemoral
compartment.  This distribution can be seen with CPPD arthropathy.
Possible loose bodies in the suprapatellar recess versus fragmented
osteophytes.

## 2012-05-30 ENCOUNTER — Other Ambulatory Visit: Payer: Medicare Other

## 2012-06-04 ENCOUNTER — Other Ambulatory Visit (INDEPENDENT_AMBULATORY_CARE_PROVIDER_SITE_OTHER): Payer: Medicare Other

## 2012-06-04 ENCOUNTER — Telehealth: Payer: Self-pay

## 2012-06-04 ENCOUNTER — Other Ambulatory Visit: Payer: Medicare Other

## 2012-06-04 DIAGNOSIS — D649 Anemia, unspecified: Secondary | ICD-10-CM

## 2012-06-04 LAB — FECAL OCCULT BLOOD, IMMUNOCHEMICAL: Fecal Occult Bld: NEGATIVE

## 2012-06-04 NOTE — Telephone Encounter (Signed)
[facility] needed IFOB ordered and letter faxed

## 2012-06-05 NOTE — Progress Notes (Signed)
Quick Note:  Patient Informed and voiced understanding ______ 

## 2012-07-08 ENCOUNTER — Other Ambulatory Visit: Payer: Self-pay | Admitting: Family Medicine

## 2012-08-17 HISTORY — PX: SKIN BIOPSY: SHX1

## 2012-09-03 ENCOUNTER — Ambulatory Visit (INDEPENDENT_AMBULATORY_CARE_PROVIDER_SITE_OTHER): Payer: Medicare Other | Admitting: Nurse Practitioner

## 2012-09-03 ENCOUNTER — Encounter: Payer: Self-pay | Admitting: Nurse Practitioner

## 2012-09-03 ENCOUNTER — Ambulatory Visit: Payer: Medicare Other | Admitting: Family Medicine

## 2012-09-03 VITALS — BP 136/80 | HR 61 | Temp 98.3°F | Ht 61.25 in | Wt 138.8 lb

## 2012-09-03 DIAGNOSIS — I259 Chronic ischemic heart disease, unspecified: Secondary | ICD-10-CM

## 2012-09-03 DIAGNOSIS — E785 Hyperlipidemia, unspecified: Secondary | ICD-10-CM

## 2012-09-03 MED ORDER — ENALAPRIL MALEATE 20 MG PO TABS: ORAL_TABLET | ORAL | Status: DC

## 2012-09-03 MED ORDER — EZETIMIBE 10 MG PO TABS: 10.0000 mg | ORAL_TABLET | Freq: Every day | ORAL | Status: DC

## 2012-09-03 NOTE — Progress Notes (Signed)
Subjective:    Jennifer Peck is here for follow up of elevated cholesterol. She is also concerned about several skin lesions. She wishes to review blood pressure control as well. Compliance with treatment for hyperlipidemia and hypertension have been excellent. The patient exercises several times weekly at senior program in [place] that incorporates strength training and agility.. Patient denies muscle pain associated with her medications.  The following portions of the patient's history were reviewed and updated as appropriate: allergies, current medications, past medical history, past social history and problem list.  Review of Systems Constitutional: negative for anorexia, chills, fatigue, fevers, night sweats and weight loss Eyes: negative for irritation, redness and visual disturbance Ears, nose, mouth, throat, and face: mild furrowing in tongue Respiratory: negative for cough, dyspnea on exertion and wheezing Cardiovascular: negative for chest pain, dyspnea, fatigue, irregular heart beat, lower extremity edema, near-syncope and tachypnea Integument/breast: positive for skin lesion(s) Musculoskeletal:negative for arthralgias, muscle weakness and myalgias Behavioral/Psych: uses xanax occasionally-last week attended family gathering & took 1/2 tab, then took another half tab for car drive home. Denies sleepiness or decreased alertness when uses 1/2 tab.    Objective:    BP 136/80  Pulse 61  Temp(Src) 98.3 F (36.8 C) (Oral)  Ht 5' 1.25" (1.556 m)  Wt 138 lb 12 oz (62.937 kg)  BMI 25.99 kg/m2  SpO2 96% General appearance: alert, cooperative, appears stated age and no distress Head: Normocephalic, without obvious abnormality, atraumatic Eyes: negative findings: lids and lashes normal, conjunctivae and sclerae normal, corneas clear and pupils equal, round, reactive to light and accomodation Throat: lips, mucosa, and tongue normal; teeth and gums normal and tongue slightly  furrowed Back: range of motion normal, symmetric, no curvature. ROM normal. No CVA tenderness. Lungs: clear to auscultation bilaterally Heart: regular rate and rhythm, S1, S2 normal, no murmur, click, rub or gallop Extremities: extremities normal, atraumatic, no cyanosis or edema and bilat great toe OA, bilat 2nd hammer toe, uses pad to prevent overlap over 3rd toes. Pulses: 2+ and symmetric Skin: several actinic keratoses at least 4 (back of R leg, back, chest, L arm) . Has one itchy pink raised lesion on back that I recommend biopsy.  Lab Review Lab Results  Component Value Date   CHOL 220* 05/07/2012   CHOL 217* 12/21/2011   CHOL 197 08/20/2011   HDL 69.90 05/07/2012   HDL 08.65 12/21/2011   HDL 78.46 08/20/2011   LDLDIRECT 122.0 05/07/2012   LDLDIRECT 132.2 12/21/2011   LDLDIRECT 137.8 05/21/2011      Assessment:     1. Dyslipidemia under good control 2.Hypertension under good control and HX ischemic heart disease 3.Skin lesions Several actinic keratoses, 1 lesions suspicious for ca..    Plan:    1. Continue dietary measures, Continue regular exercise., Lipid-lowering medications: zetia F/u 3 mos (also get flu vaccine) 2.Continue vasotec and metoprolol. Call Dr. Patty Sermons for f/u appt.-last note (6/13) indicated f/u 6 mos 3. Apply vit e & sunscreen to keratoses. Recommend biopsy of pink lesion on back. Make f/u appt for biopsy. See pt instructions.

## 2012-09-03 NOTE — Patient Instructions (Addendum)
Use Vitamin E oil in brown scaly patches if they are itchy. Also, use sunscreen especially over these areas. Come back for a biopsy of the 1 lesion on your back. I will see you again in September for a flu shot and follow-up for labs to check cholesterol, electrolytes, kidney & liver function. Pleasure to meet you today.

## 2012-09-15 ENCOUNTER — Encounter: Payer: Self-pay | Admitting: Family Medicine

## 2012-09-15 ENCOUNTER — Ambulatory Visit (INDEPENDENT_AMBULATORY_CARE_PROVIDER_SITE_OTHER): Payer: Medicare Other | Admitting: Family Medicine

## 2012-09-15 VITALS — BP 147/84 | HR 64 | Temp 98.3°F | Resp 16 | Ht 61.0 in | Wt 137.0 lb

## 2012-09-15 DIAGNOSIS — L989 Disorder of the skin and subcutaneous tissue, unspecified: Secondary | ICD-10-CM

## 2012-09-15 NOTE — Progress Notes (Signed)
OFFICE NOTE  09/15/2012  CC:  Chief Complaint  Patient presents with  . skin biopsy     HPI: Patient is a 79 y.o. Caucasian female who is here for skin biopsy--seen by NP Maximino Sarin in recent past and was told to return so this procedure could be done. She has a new crusty papule on mid back area.   Pertinent PMH:  +Hx of SCC of skin and BCC of skin  MEDS:  Outpatient Prescriptions Prior to Visit  Medication Sig Dispense Refill  . ALPRAZolam (XANAX) 0.25 MG tablet Take 1 tablet (0.25 mg total) by mouth daily as needed. Panic attack  30 tablet  3  . aspirin 325 MG tablet Take 325 mg by mouth daily.        . Benfotiamine 150 MG CAPS Take 1 capsule by mouth daily. For numbness in feet      . Calcium Carbonate-Vitamin D (CALCIUM 500 + D PO) Take by mouth daily.        . Cyanocobalamin (VITAMIN B-12 CR PO) Take by mouth.      . enalapril (VASOTEC) 20 MG tablet Take 1T po in am and 1/2 tab po qhs.  135 tablet  0  . ezetimibe (ZETIA) 10 MG tablet Take 1 tablet (10 mg total) by mouth daily.  30 tablet  6  . fluticasone (FLONASE) 50 MCG/ACT nasal spray Place 2 sprays into the nose daily.  16 g  6  . Ketotifen Fumarate (ALLERGY EYE DROPS OP) Place 2 drops into both eyes as needed. Red itchy eyes       . magnesium oxide (MAG-OX) 400 MG tablet Take 400 mg by mouth daily.        . metoprolol succinate (TOPROL-XL) 25 MG 24 hr tablet Takes 1 tab daily  90 tablet  2  . Multiple Vitamin (MULTIVITAMIN) tablet Take 1 tablet by mouth daily. Takes occ      . NAFTIN 1 % cream       . Omega-3 Fatty Acids (FISH OIL PO) Take 1 capsule by mouth daily.       Marland Kitchen OVER THE COUNTER MEDICATION Sinus buster- daily prn      . sodium chloride (OCEAN) 0.65 % nasal spray Place 2 sprays into the nose as needed.      . Sodium Chloride-Sodium Bicarb (RA SINUS WASH NETI POT NA) Place into the nose as needed. sinuses        No facility-administered medications prior to visit.    PE: Blood pressure 147/84, pulse  64, temperature 98.3 F (36.8 C), temperature source Oral, resp. rate 16, height 5\' 1"  (1.549 m), weight 137 lb (62.143 kg), SpO2 95.00%. Gen: Alert, well appearing.  Patient is oriented to person, place, time, and situation. Skin: many AK's on back.  In mid back just to the right of midline she has a crusty papular lesion that is about 2 mm in diameter, flesh colored.    IMPRESSION AND PLAN:  Skin biopsy: pt wanted to proceed with this today. After the area was infiltrated with 1.5 cc of 1% lidocaine with epi, I used a flexible dermablade to shave the lesion and a 2 mm perimeter of normal skin. Pt tolerated procedure well, no immediate complications. Wound care discussed. Specimen sent to path.  FOLLOW UP: prn

## 2012-09-17 ENCOUNTER — Encounter: Payer: Self-pay | Admitting: Family Medicine

## 2012-10-15 ENCOUNTER — Encounter: Payer: Self-pay | Admitting: Cardiology

## 2012-10-15 ENCOUNTER — Ambulatory Visit (INDEPENDENT_AMBULATORY_CARE_PROVIDER_SITE_OTHER): Payer: Medicare Other | Admitting: Cardiology

## 2012-10-15 VITALS — BP 130/82 | HR 60 | Ht 61.0 in | Wt 141.4 lb

## 2012-10-15 DIAGNOSIS — I119 Hypertensive heart disease without heart failure: Secondary | ICD-10-CM

## 2012-10-15 DIAGNOSIS — I259 Chronic ischemic heart disease, unspecified: Secondary | ICD-10-CM

## 2012-10-15 DIAGNOSIS — E785 Hyperlipidemia, unspecified: Secondary | ICD-10-CM

## 2012-10-15 DIAGNOSIS — I10 Essential (primary) hypertension: Secondary | ICD-10-CM

## 2012-10-15 MED ORDER — METOPROLOL SUCCINATE ER 25 MG PO TB24: ORAL_TABLET | ORAL | Status: DC

## 2012-10-15 NOTE — Assessment & Plan Note (Signed)
Blood pressure has been remaining stable.  She is not having any symptoms of congestive heart failure.  No palpitations.  No dizziness or syncope

## 2012-10-15 NOTE — Assessment & Plan Note (Signed)
She is taking ezetimibe for her hypercholesterolemia.  She is intolerant to statins.  She is not having any GI symptoms or myalgias

## 2012-10-15 NOTE — Patient Instructions (Signed)
Your physician recommends that you continue on your current medications as directed. Please refer to the Current Medication list given to you today.  Your physician wants you to follow-up in: 1 year. You will receive a reminder letter in the mail two months in advance. If you don't receive a letter, please call our office to schedule the follow-up appointment.  

## 2012-10-15 NOTE — Assessment & Plan Note (Signed)
The patient has not been having any recurrent chest pain or angina.  She gets plenty of exercise at the recreation center in [facility].  She is also considering taking yoga.

## 2012-10-15 NOTE — Progress Notes (Signed)
Jennifer Peck Date of Birth:  1922/11/02 [facility] [address] [place], [place]  [address] [phone]         Fax   [phone]  History of Present Illness: This pleasant 79 year old woman is seen for a scheduled followup office visit. She has a history of essential hypertension and a history of ischemic heart disease. She had 3 bare-metal stents placed on 12/20/09 into her mid left circumflex coronary artery, the second obtuse marginal vessel, and the first obtuse marginal vessel and into the mid right coronary artery. He has done well since then with no recurrent angina. Has occasional indigestion for which she takes Pepcid. She has not had to take any sublingual nitroglycerin. She continues to exercise regularly [place] recreation [place] or fit and strong program she has a history of hypercholesterolemia.  In the summer she enjoys swimming in [place].  She has a history of hypercholesterolemia but is statin intolerant.  Current Outpatient Prescriptions  Medication Sig Dispense Refill  . ALPRAZolam (XANAX) 0.25 MG tablet Take 1 tablet (0.25 mg total) by mouth daily as needed. Panic attack  30 tablet  3  . aspirin 325 MG tablet Take 162.5 mg by mouth daily.       . Benfotiamine 150 MG CAPS Take 1 capsule by mouth daily. For numbness in feet      . Calcium Carbonate-Vitamin D (CALCIUM 500 + D PO) Take by mouth once a week.       . Cyanocobalamin (VITAMIN B-12 CR PO) Take by mouth.      . DiphenhydrAMINE HCl (BENADRYL ALLERGY PO) Take by mouth as needed.      . enalapril (VASOTEC) 20 MG tablet Take 1T po in am and 1/2 tab po qhs.  135 tablet  0  . ezetimibe (ZETIA) 10 MG tablet Take 1 tablet (10 mg total) by mouth daily.  30 tablet  6  . fluticasone (FLONASE) 50 MCG/ACT nasal spray Place 2 sprays into the nose daily.  16 g  6  . Ketotifen Fumarate (ALLERGY EYE DROPS OP) Place 2 drops into both eyes as needed. Red itchy eyes       . magnesium oxide  (MAG-OX) 400 MG tablet Take 400 mg by mouth once a week.       . metoprolol succinate (TOPROL-XL) 25 MG 24 hr tablet Takes 1 tab daily  90 tablet  3  . Multiple Vitamin (MULTIVITAMIN) tablet Take 1 tablet by mouth daily. Takes occ      . NAFTIN 1 % cream       . Omega-3 Fatty Acids (FISH OIL PO) Take 1 capsule by mouth daily.       Marland Kitchen OVER THE COUNTER MEDICATION Sinus buster- daily      . sodium chloride (OCEAN) 0.65 % nasal spray Place 2 sprays into the nose as needed.      . Sodium Chloride-Sodium Bicarb (RA SINUS WASH NETI POT NA) Place into the nose daily. sinuses       No current facility-administered medications for this visit.    Allergies  Allergen Reactions  . Chlorthalidone     hyponatremia  . Atenolol     Caused fatigue   . Besivance (Besifloxacin Hcl)     rash  . Codeine Nausea And Vomiting  . Crestor (Rosuvastatin)     "leg gave away"  . Gabapentin     Memory loss and confusion  . Lovastatin     myalgia  .  Simvastatin     Her leg gave away on her and states thinks secondary to Simvastatin  . Vigamox (Moxifloxacin)     rash  . Welchol (Colesevelam Hcl)     Patient Active Problem List   Diagnosis Date Noted  . Esophageal reflux 05/11/2012  . Lesion of labia 02/04/2012  . Hyperlipidemia 04/30/2011  . Allergic state 04/30/2011  . Seborrheic keratosis 04/30/2011  . Hyperkalemia 01/23/2011  . Hyponatremia 11/23/2010  . Osteopenia 11/23/2010  . BCC (basal cell carcinoma), face 11/06/2010  . SCC (squamous cell carcinoma), arm 11/06/2010  . Bilateral bunions 11/06/2010  . Peripheral neuropathy 11/06/2010  . Constipation 11/06/2010  . Left leg pain 11/06/2010  . PAC (premature atrial contraction) 06/06/2010  . Encounter for long-term (current) use of other medications 06/06/2010  . Pure hypercholesterolemia 06/06/2010  . Ischemic heart disease 06/06/2010  . Hypertension   . Anxiety   . Vertigo     History  Smoking status  . Former Smoker  . Quit date:  09/04/1977  Smokeless tobacco  . Never Used    History  Alcohol Use  . Yes    Comment: only on Holidays    Family History  Problem Relation Age of Onset  . Stroke Mother   . Heart disease Father   . Hypertension Daughter   . Hyperlipidemia Daughter   . Hypertension Son   . Cancer Daughter     rectal/ chemo and radiation  . Hypertension Daughter   . Hypertension Son   . Hyperlipidemia Son   . Hypertension Son   . Vision loss Maternal Grandfather   . Stroke Paternal Grandmother   . Asthma Paternal Grandfather     Review of Systems: Constitutional: no fever chills diaphoresis or fatigue or change in weight.  Head and neck: no hearing loss, no epistaxis, no photophobia or visual disturbance. Respiratory: No cough, shortness of breath or wheezing. Cardiovascular: No chest pain peripheral edema, palpitations. Gastrointestinal: No abdominal distention, no abdominal pain, no change in bowel habits hematochezia or melena. Genitourinary: No dysuria, no frequency, no urgency, no nocturia. Musculoskeletal:No arthralgias, no back pain, no gait disturbance or myalgias. Neurological: No dizziness, no headaches, no numbness, no seizures, no syncope, no weakness, no tremors. Hematologic: No lymphadenopathy, no easy bruising. Psychiatric: No confusion, no hallucinations, no sleep disturbance.    Physical Exam: Filed Vitals:   10/15/12 0922  BP: 130/82  Pulse: 60   the general appearance reveals a well-developed well-nourished elderly woman in no distress.The head and neck exam reveals pupils equal and reactive.  Extraocular movements are full.  There is no scleral icterus.  The mouth and pharynx are normal.  The neck is supple.  The carotids reveal no bruits.  The jugular venous pressure is normal.  The  thyroid is not enlarged.  There is no lymphadenopathy.  The chest is clear to percussion and auscultation.  There are no rales or rhonchi.  Expansion of the chest is symmetrical.  The  precordium is quiet.  The first heart sound is normal.  The second heart sound is physiologically split.  There is no murmur gallop rub or click.  There is no abnormal lift or heave.  The abdomen is soft and nontender.  The bowel sounds are normal.  The liver and spleen are not enlarged.  There are no abdominal masses.  There are no abdominal bruits.  Extremities reveal good pedal pulses.  There is no phlebitis or edema.  There is no cyanosis or clubbing.  Strength  is normal and symmetrical in all extremities.  There is no lateralizing weakness.  There are no sensory deficits.  The skin is warm and dry.  There is no rash.  EKG today shows normal sinus rhythm and no ischemic changes and is unchanged since 08/23/11   Assessment / Plan: The patient is doing well.  Continue same medication.  She gets her lipids checked through her PCP.  She will return in one year for followup office visit and EKG here.

## 2012-10-21 ENCOUNTER — Ambulatory Visit (INDEPENDENT_AMBULATORY_CARE_PROVIDER_SITE_OTHER): Payer: Medicare Other | Admitting: Nurse Practitioner

## 2012-10-21 ENCOUNTER — Encounter: Payer: Self-pay | Admitting: Nurse Practitioner

## 2012-10-21 VITALS — BP 130/64 | HR 63 | Temp 97.6°F | Ht 61.25 in | Wt 141.2 lb

## 2012-10-21 DIAGNOSIS — S51809A Unspecified open wound of unspecified forearm, initial encounter: Secondary | ICD-10-CM

## 2012-10-21 DIAGNOSIS — S51811A Laceration without foreign body of right forearm, initial encounter: Secondary | ICD-10-CM

## 2012-10-21 NOTE — Progress Notes (Signed)
  Subjective:    Patient ID: Jennifer Peck, female    DOB: 1922-04-19, 79 y.o.   MRN: [id number]  Laceration  The incident occurred 2 days ago. The laceration is located on the right arm. The laceration is 1 cm (V shape, skin tear) in size. The laceration mechanism was a metal edge (latch on trailor door). The pain is mild (occasional pain when rotates forearm). The pain has been intermittent since onset. Possible foreign bodies include metal. Her tetanus status is UTD.      Review of Systems  Constitutional: Negative for fever, chills, activity change, appetite change and fatigue.  Musculoskeletal: Negative for joint swelling.  Skin: Positive for wound.  Neurological: Negative for weakness.       Objective:   Physical Exam  Vitals reviewed. Constitutional: She is oriented to person, place, and time. She appears well-developed and well-nourished. No distress.  HENT:  Head: Normocephalic and atraumatic.  Pulmonary/Chest: Effort normal.  Musculoskeletal: She exhibits tenderness (see skin note). She exhibits no edema.  Neurological: She is alert and oriented to person, place, and time.  Skin: Skin is warm and dry.     Superficial skin tear R forearm. V-shape, approx 1 cm across. Skin flap intact & adherent to wound. No erythema around wound, no drainage, scant old blood on band-aid that was removed. No swelling. C/o tenderness below wound when rotates forearm. No tenderness to palpation. Irrigated wound w/saline using 25 ga needle & 10 ml syringe. Applied bacitracin & covered with telfa, wrapped with coban.  Psychiatric: She has a normal mood and affect. Her behavior is normal. Thought content normal.          Assessment & Plan:  1. Skin tear of right forearm without complication See pt instructions for care.

## 2012-10-21 NOTE — Patient Instructions (Signed)
Your wound looks good. I see no signs of infection. Wash gently once daily with mild soap & water. Do not use friction that will disturb the skin flap. Apply thin layer bacitracin. Cover with non-adherent bandage. It may take 2 weeks for the skin under the wound to heal. After 1 week , the top skin flap may shrink & loosen. If so, it is OK to trim off with clean scissors. If you see signs of infection, please return for evaluation : redness, swelling, increase pain, or pus. Great to see you!

## 2012-10-22 ENCOUNTER — Other Ambulatory Visit: Payer: Self-pay

## 2012-12-02 ENCOUNTER — Ambulatory Visit (INDEPENDENT_AMBULATORY_CARE_PROVIDER_SITE_OTHER): Payer: Medicare Other | Admitting: Nurse Practitioner

## 2012-12-02 ENCOUNTER — Encounter: Payer: Self-pay | Admitting: Nurse Practitioner

## 2012-12-02 VITALS — BP 150/84 | HR 63 | Temp 97.7°F | Ht 61.25 in | Wt 137.0 lb

## 2012-12-02 DIAGNOSIS — Z23 Encounter for immunization: Secondary | ICD-10-CM

## 2012-12-02 DIAGNOSIS — E785 Hyperlipidemia, unspecified: Secondary | ICD-10-CM

## 2012-12-02 LAB — LIPID PANEL
Total CHOL/HDL Ratio: 3
Triglycerides: 62 mg/dL (ref 0.0–149.0)
VLDL: 12.4 mg/dL (ref 0.0–40.0)

## 2012-12-02 LAB — LDL CHOLESTEROL, DIRECT: Direct LDL: 128 mg/dL

## 2012-12-02 NOTE — Patient Instructions (Addendum)
I will call you regarding your lab results. You look great! Keep exercising!

## 2012-12-02 NOTE — Progress Notes (Signed)
Subjective:    Jennifer Peck is here for follow up of elevated cholesterol. She was started on Zetia 3 mos ago. Compliance with treatment has been excellent. The patient exercises frequently. Patient denies muscle pain associated with her medications.  The following portions of the patient's history were reviewed and updated as appropriate: allergies, current medications, past family history, past medical history, past social history, past surgical history and problem list.  Review of Systems Constitutional: negative for fatigue, fevers and night sweats Respiratory: negative for cough, dyspnea on exertion, sputum and wheezing Cardiovascular: negative for chest pain, chest pressure/discomfort and lower extremity edema Gastrointestinal: negative for abdominal pain and change in bowel habits Integument/breast: several sebhorreic keratoses, few scaly patches on arms. Musculoskeletal:negative for muscle weakness and myalgias    Objective:    BP 150/84  Pulse 63  Temp(Src) 97.7 F (36.5 C) (Oral)  Ht 5' 1.25" (1.556 m)  Wt 137 lb (62.143 kg)  BMI 25.67 kg/m2  SpO2 94% General appearance: alert, cooperative, appears stated age and no distress Head: Normocephalic, without obvious abnormality, atraumatic Eyes: negative findings: lids and lashes normal and conjunctivae and sclerae normal Extremities: extremities normal, atraumatic, no cyanosis or edema Pulses: 2+ and symmetric Skin: seborrheic keratosis w/horn, approx 3mm L anterior chest  Lab Review Lab Results  Component Value Date   CHOL 220* 05/07/2012   CHOL 217* 12/21/2011   CHOL 197 08/20/2011   HDL 69.90 05/07/2012   HDL 16.10 12/21/2011   HDL 96.04 08/20/2011   LDLDIRECT 122.0 05/07/2012   LDLDIRECT 132.2 12/21/2011   LDLDIRECT 137.8 05/21/2011      Assessment:    Dyslipidemia under good control. Discussed lack of study data in using statins in persons over 80. Pt wants to see how labs look before discontinuing. Seborrheic  keratoses, dry skin.    Plan:    1. Continue dietary measures. Continue regular exercise.  Lipid-lowering medications: zetia 10 mg. Check lipids & liver today. Follow up in 6 months. 2 stop using soap on arms & legs-just use on areas dirty areas. Use lubricating lotion after every shower, offered to remove horny keratosis, pt declined. 3 Needs pneumococcal 13 valent. Pt wants to get after beach trip, will schedule at her convenience.Marland Kitchen

## 2012-12-03 ENCOUNTER — Ambulatory Visit: Payer: Medicare Other | Admitting: Nurse Practitioner

## 2012-12-10 ENCOUNTER — Ambulatory Visit: Payer: Medicare Other | Admitting: Nurse Practitioner

## 2012-12-17 ENCOUNTER — Ambulatory Visit (INDEPENDENT_AMBULATORY_CARE_PROVIDER_SITE_OTHER): Payer: Medicare Other | Admitting: Family Medicine

## 2012-12-17 DIAGNOSIS — Z23 Encounter for immunization: Secondary | ICD-10-CM

## 2013-01-06 ENCOUNTER — Other Ambulatory Visit: Payer: Self-pay | Admitting: Family Medicine

## 2013-01-06 DIAGNOSIS — I259 Chronic ischemic heart disease, unspecified: Secondary | ICD-10-CM

## 2013-01-06 MED ORDER — ENALAPRIL MALEATE 20 MG PO TABS: ORAL_TABLET | ORAL | Status: DC

## 2013-01-06 NOTE — Telephone Encounter (Signed)
Rx filled per [facility] protocol.

## 2013-01-29 ENCOUNTER — Other Ambulatory Visit: Payer: Self-pay | Admitting: Nurse Practitioner

## 2013-01-29 DIAGNOSIS — F419 Anxiety disorder, unspecified: Secondary | ICD-10-CM

## 2013-01-30 MED ORDER — ALPRAZOLAM 0.25 MG PO TABS: 0.2500 mg | ORAL_TABLET | Freq: Every day | ORAL | Status: DC | PRN

## 2013-01-30 NOTE — Telephone Encounter (Signed)
Refill request for Xanax Last filled by MD on - 05/21/12 #30 x3 Last Appt: 12/02/12 Next Appt: 06/03/13 Please advise refill?  Patient should have 0 quanity left.

## 2013-04-06 ENCOUNTER — Other Ambulatory Visit: Payer: Self-pay | Admitting: *Deleted

## 2013-04-06 DIAGNOSIS — I259 Chronic ischemic heart disease, unspecified: Secondary | ICD-10-CM

## 2013-04-06 MED ORDER — ENALAPRIL MALEATE 20 MG PO TABS: ORAL_TABLET | ORAL | Status: DC

## 2013-04-09 ENCOUNTER — Ambulatory Visit (INDEPENDENT_AMBULATORY_CARE_PROVIDER_SITE_OTHER): Payer: Medicare Other | Admitting: Family Medicine

## 2013-04-09 ENCOUNTER — Encounter: Payer: Self-pay | Admitting: Family Medicine

## 2013-04-09 VITALS — BP 169/92 | HR 66 | Temp 98.7°F | Ht 61.25 in | Wt 142.0 lb

## 2013-04-09 DIAGNOSIS — H1045 Other chronic allergic conjunctivitis: Secondary | ICD-10-CM

## 2013-04-09 DIAGNOSIS — J3089 Other allergic rhinitis: Secondary | ICD-10-CM

## 2013-04-09 DIAGNOSIS — H1013 Acute atopic conjunctivitis, bilateral: Secondary | ICD-10-CM

## 2013-04-09 DIAGNOSIS — J309 Allergic rhinitis, unspecified: Secondary | ICD-10-CM

## 2013-04-09 MED ORDER — LEVOCETIRIZINE DIHYDROCHLORIDE 5 MG PO TABS: 5.0000 mg | ORAL_TABLET | Freq: Every evening | ORAL | Status: DC

## 2013-04-09 NOTE — Progress Notes (Signed)
Pre-visit discussion using our clinic review tool. No additional management support is needed unless otherwise documented below in the visit note.  

## 2013-04-09 NOTE — Progress Notes (Signed)
OFFICE NOTE  04/09/2013  CC: red eye, URI sx's   HPI: Patient is [name] who is here for resp sx's. Has chronic runny and itchy/burning eyes, nasal congestion/runny nose, head fullness.  +PND. No signif coughing.  No fevers. About 3 d/a her right eye had a burst blood vessel.--has been using pataday from her eye doctor at [place].    She has some questions about cholesterol meds, apparently some hx of side effects from the meds, most recently was on zetia and stopped this 10/2012.     Pertinent PMH:  Past Medical History  Diagnosis Date  . Hypertension   . Anxiety   . Vertigo   . Coronary artery disease   . Hyperlipidemia   . BCC (basal cell carcinoma), face 11/06/2010  . SCC (squamous cell carcinoma), arm 11/06/2010  . Bilateral bunions 11/06/2010  . Peripheral neuropathy 11/06/2010  . Constipation 11/06/2010  . Hyponatremia 11/23/2010  . Osteopenia 11/23/2010  . Hyperkalemia 01/23/2011  . Hyperlipidemia 04/30/2011  . Allergic state 04/30/2011  . Seborrheic keratosis 04/30/2011  . Lesion of labia 02/04/2012  . Esophageal reflux 05/11/2012    MEDS:  Outpatient Prescriptions Prior to Visit  Medication Sig Dispense Refill  . ALPRAZolam (XANAX) 0.25 MG tablet Take 1 tablet (0.25 mg total) by mouth daily as needed. Panic attack  30 tablet  3  . aspirin 325 MG tablet Take 162.5 mg by mouth daily.       . Benfotiamine 150 MG CAPS Take 1 capsule by mouth daily. For numbness in feet      . Calcium Carbonate-Vitamin D (CALCIUM 500 + D PO) Take by mouth once a week.       . Cyanocobalamin (VITAMIN B-12 CR PO) Take by mouth.      . DiphenhydrAMINE HCl (BENADRYL ALLERGY PO) Take by mouth as needed.      . enalapril (VASOTEC) 20 MG tablet Take 1T po in am and 1/2 tab po qhs.  135 tablet  0  . ezetimibe (ZETIA) 10 MG tablet Take 1 tablet (10 mg total) by mouth daily.  30 tablet  6  . fluticasone (FLONASE) 50 MCG/ACT nasal spray Place 2 sprays into the nose daily.   16 g  6  . Ketotifen Fumarate (ALLERGY EYE DROPS OP) Place 2 drops into both eyes as needed. Red itchy eyes       . magnesium oxide (MAG-OX) 400 MG tablet Take 400 mg by mouth once a week.       . metoprolol succinate (TOPROL-XL) 25 MG 24 hr tablet Takes 1 tab daily  90 tablet  3  . Multiple Vitamin (MULTIVITAMIN) tablet Take 1 tablet by mouth daily. Takes occ      . NAFTIN 1 % cream       . Omega-3 Fatty Acids (FISH OIL PO) Take 1 capsule by mouth daily.       Marland Kitchen OVER THE COUNTER MEDICATION Sinus buster- daily      . sodium chloride (OCEAN) 0.65 % nasal spray Place 2 sprays into the nose as needed.      . Sodium Chloride-Sodium Bicarb (RA SINUS [place] NETI POT NA) Place into the nose daily. sinuses       No facility-administered medications prior to visit.    PE: Blood pressure 169/92, pulse 66, temperature 98.7 F (37.1 C), temperature source Temporal, height 5' 1.25" (1.556 m), weight 142 lb (64.411 kg), SpO2 97.00%. VS: noted--normal. Gen: alert, NAD, NONTOXIC APPEARING.  HEENT: eyes without mild diffuse bulbar and palpebral conjunctival injection, drainage, or swelling.  Ears: EACs clear, TMs with normal light reflex and landmarks.  Nose: Clear rhinorrhea, with some dried, crusty exudate adherent to mildly injected mucosa.  No purulent d/c.  No paranasal sinus TTP.  No facial swelling.  Throat and mouth without focal lesion.  No pharyngial swelling, erythema, or exudate.   Neck: supple, no LAD.   LUNGS: CTA bilat, nonlabored resps.   CV: RRR, no m/r/g. EXT: no c/c/e SKIN: no rash  Lab Results  Component Value Date   CHOL 205* 12/02/2012   HDL 65.30 12/02/2012   LDLCALC 110* 08/20/2011   LDLDIRECT 128.0 12/02/2012   TRIG 62.0 12/02/2012   CHOLHDL 3 12/02/2012    IMPRESSION AND PLAN:  1) Chronic allergic rhinitis and allergic conjunctivitis, possibly with superimposed viral URI/contjunctivitis lately. No sign of bacterial infection. Continue pataday eye drops, continue her nasal  steroid (nasacort per pt, flonase per med list--?). Start daily xyzal 5mg .  2) Hyperlipidemia: hx of multiple med intolerance.  We decided she should make a return appt to discuss this in detail and repeat FLP.  FOLLOW UP: prn

## 2013-06-03 ENCOUNTER — Ambulatory Visit: Payer: Medicare Other | Admitting: Nurse Practitioner

## 2013-06-03 ENCOUNTER — Encounter: Payer: Self-pay | Admitting: Family Medicine

## 2013-06-03 ENCOUNTER — Ambulatory Visit (INDEPENDENT_AMBULATORY_CARE_PROVIDER_SITE_OTHER): Payer: Medicare Other | Admitting: Family Medicine

## 2013-06-03 VITALS — BP 112/73 | HR 60 | Temp 99.2°F | Resp 18 | Ht 61.25 in | Wt 144.0 lb

## 2013-06-03 DIAGNOSIS — H1045 Other chronic allergic conjunctivitis: Secondary | ICD-10-CM

## 2013-06-03 DIAGNOSIS — I25810 Atherosclerosis of coronary artery bypass graft(s) without angina pectoris: Secondary | ICD-10-CM

## 2013-06-03 DIAGNOSIS — I259 Chronic ischemic heart disease, unspecified: Secondary | ICD-10-CM

## 2013-06-03 DIAGNOSIS — E785 Hyperlipidemia, unspecified: Secondary | ICD-10-CM

## 2013-06-03 DIAGNOSIS — I10 Essential (primary) hypertension: Secondary | ICD-10-CM

## 2013-06-03 DIAGNOSIS — H1010 Acute atopic conjunctivitis, unspecified eye: Secondary | ICD-10-CM | POA: Insufficient documentation

## 2013-06-03 LAB — LIPID PANEL
CHOLESTEROL: 255 mg/dL — AB (ref 0–200)
HDL: 68.5 mg/dL (ref >39.00)
LDL CALC: 169 mg/dL — AB (ref 0–99)
Total CHOL/HDL Ratio: 4
Triglycerides: 86 mg/dL (ref 0.0–149.0)
VLDL: 17.2 mg/dL (ref 0.0–40.0)

## 2013-06-03 LAB — BASIC METABOLIC PANEL
BUN: 14 mg/dL (ref 6–23)
CALCIUM: 9.4 mg/dL (ref 8.4–10.5)
CHLORIDE: 97 meq/L (ref 96–112)
CO2: 28 mEq/L (ref 19–32)
CREATININE: 0.8 mg/dL (ref 0.4–1.2)
GFR: 70.83 mL/min (ref >60.00)
Glucose, Bld: 80 mg/dL (ref 70–99)
Potassium: 4.2 mEq/L (ref 3.5–5.1)
Sodium: 135 mEq/L (ref 135–145)

## 2013-06-03 LAB — CBC WITH DIFFERENTIAL/PLATELET
BASOS PCT: 0.6 % (ref 0.0–3.0)
Basophils Absolute: 0 10*3/uL (ref 0.0–0.1)
EOS ABS: 0.1 10*3/uL (ref 0.0–0.7)
Eosinophils Relative: 1.6 % (ref 0.0–5.0)
HEMATOCRIT: 42.7 % (ref 36.0–46.0)
HEMOGLOBIN: 14.3 g/dL (ref 12.0–15.0)
LYMPHS ABS: 2.2 10*3/uL (ref 0.7–4.0)
LYMPHS PCT: 47.3 % — AB (ref 12.0–46.0)
MCHC: 33.5 g/dL (ref 30.0–36.0)
MCV: 95 fl (ref 78.0–100.0)
MONO ABS: 0.5 10*3/uL (ref 0.1–1.0)
Monocytes Relative: 10.2 % (ref 3.0–12.0)
NEUTROS ABS: 1.9 10*3/uL (ref 1.4–7.7)
Neutrophils Relative %: 40.3 % — ABNORMAL LOW (ref 43.0–77.0)
Platelets: 169 10*3/uL (ref 150.0–400.0)
RBC: 4.5 Mil/uL (ref 3.87–5.11)
RDW: 13.7 % (ref 11.5–14.6)
WBC: 4.7 10*3/uL (ref 4.5–10.5)

## 2013-06-03 MED ORDER — AZELASTINE HCL 0.05 % OP SOLN: 2.0000 [drp] | Freq: Two times a day (BID) | OPHTHALMIC | Status: DC

## 2013-06-03 MED ORDER — ATORVASTATIN CALCIUM 20 MG PO TABS: 20.0000 mg | ORAL_TABLET | Freq: Every day | ORAL | Status: DC

## 2013-06-03 NOTE — Progress Notes (Signed)
OFFICE NOTE  06/03/2013  CC:  Chief Complaint  Patient presents with  . Follow-up    6 months, fasting  . Allergies  . Alopecia     HPI: Patient is [name] who is here for 6 mo f/u hyperlipidemia, CAD, and HTN. Pt with hx of statin intolerance: crestor, lovastatin, and simvastatin all caused generalized weakness and L>R leg myalgias. Was on zetia but has been off this for 6 mo but was tolerating this fine.  She is open to starting a different statin since she has CAD with hx of 3 BM stents.  She does still have routine cardiology f/u with [name].  Has probs with chronic allergic conjunctivitis despite taking daily xyzal, pataday, and flonase. Eyes itch, clear exudate, red.  Has chronic runny nose. She does not recall being on any other allergy eye drop.  Pertinent PMH:  Past medical, surgical, social, and family history reviewed and no changes are noted since last office visit.  MEDS:  Outpatient Prescriptions Prior to Visit  Medication Sig Dispense Refill  . ALPRAZolam (XANAX) 0.25 MG tablet Take 1 tablet (0.25 mg total) by mouth daily as needed. Panic attack  30 tablet  3  . aspirin 325 MG tablet Take 162.5 mg by mouth daily.       . Benfotiamine 150 MG CAPS Take 1 capsule by mouth daily. For numbness in feet      . Cyanocobalamin (VITAMIN B-12 CR PO) Take by mouth.      . DiphenhydrAMINE HCl (BENADRYL ALLERGY PO) Take by mouth as needed.      . enalapril (VASOTEC) 20 MG tablet Take 1T po in am and 1/2 tab po qhs.  135 tablet  0  . fluticasone (FLONASE) 50 MCG/ACT nasal spray Place 2 sprays into the nose daily.  16 g  6  . levocetirizine (XYZAL) 5 MG tablet Take 1 tablet (5 mg total) by mouth every evening.  30 tablet  12  . magnesium oxide (MAG-OX) 400 MG tablet Take 400 mg by mouth once a week.       . metoprolol succinate (TOPROL-XL) 25 MG 24 hr tablet Takes 1 tab daily  90 tablet  3  . Multiple Vitamin (MULTIVITAMIN) tablet Take 1 tablet by mouth  daily. Takes occ      . NAFTIN 1 % cream       . Omega-3 Fatty Acids (FISH OIL PO) Take 1 capsule by mouth daily.       . Sodium Chloride-Sodium Bicarb (RA SINUS [place] NETI POT NA) Place into the nose daily. sinuses      . Calcium Carbonate-Vitamin D (CALCIUM 500 + D PO) Take by mouth once a week.       . ezetimibe (ZETIA) 10 MG tablet Take 1 tablet (10 mg total) by mouth daily.  30 tablet  6  . Ketotifen Fumarate (ALLERGY EYE DROPS OP) Place 2 drops into both eyes as needed. Red itchy eyes       . OVER THE COUNTER MEDICATION Sinus buster- daily      . sodium chloride (OCEAN) 0.65 % nasal spray Place 2 sprays into the nose as needed.       No facility-administered medications prior to visit.    PE: Blood pressure 112/73, pulse 60, temperature 99.2 F (37.3 C), temperature source Temporal, resp. rate 18, height 5' 1.25" (1.556 m), weight 144 lb (65.318 kg), SpO2 97.00%. Gen: Alert, well appearing.  Patient is oriented to person,  place, time, and situation. Eyes: mild diffuse bulbar conjunctival injection, +allergic shiners.  No eye drainage. Neck: supple/nontender.  No LAD, mass, or TM.  Carotid pulses 2+ bilaterally, without bruits. CV: RRR, no m/r/g.   LUNGS: CTA bilat, nonlabored resps, good aeration in all lung fields. EXT: no clubbing, cyanosis, or edema.    IMPRESSION AND PLAN:  1) Hyperlipidemia with hx of CAD s/p stents: she is asymptomatic and maintaining healthy lifestyle. I do think she needs to try another statin so I'll try atorvastatin 20mg  once daily. FLP drawn today.  2) HTN.  The current medical regimen is effective;  continue present plan and medications. BMET today.  3) Chronic allergic rhinitis and conjunctivitis: continue all current meds but stop pataday and try optivar.  An After Visit Summary was printed and given to the patient.  FOLLOW UP: 4 mo

## 2013-06-04 ENCOUNTER — Encounter: Payer: Self-pay | Admitting: Family Medicine

## 2013-06-04 ENCOUNTER — Telehealth: Payer: Self-pay | Admitting: Family Medicine

## 2013-06-04 NOTE — Telephone Encounter (Signed)
Relevant patient education mailed to patient.  

## 2013-07-03 ENCOUNTER — Other Ambulatory Visit: Payer: Self-pay | Admitting: Physician Assistant

## 2013-07-06 ENCOUNTER — Other Ambulatory Visit: Payer: Self-pay

## 2013-07-06 DIAGNOSIS — I259 Chronic ischemic heart disease, unspecified: Secondary | ICD-10-CM

## 2013-07-06 MED ORDER — ENALAPRIL MALEATE 20 MG PO TABS: ORAL_TABLET | ORAL | Status: DC

## 2013-10-07 ENCOUNTER — Ambulatory Visit (INDEPENDENT_AMBULATORY_CARE_PROVIDER_SITE_OTHER): Payer: Medicare Other | Admitting: Family Medicine

## 2013-10-07 ENCOUNTER — Encounter: Payer: Self-pay | Admitting: Family Medicine

## 2013-10-07 VITALS — BP 174/80 | HR 67 | Temp 98.7°F | Resp 18 | Ht 61.0 in | Wt 141.0 lb

## 2013-10-07 DIAGNOSIS — Z0000 Encounter for general adult medical examination without abnormal findings: Secondary | ICD-10-CM

## 2013-10-07 DIAGNOSIS — I259 Chronic ischemic heart disease, unspecified: Secondary | ICD-10-CM

## 2013-10-07 DIAGNOSIS — I10 Essential (primary) hypertension: Secondary | ICD-10-CM

## 2013-10-07 DIAGNOSIS — E785 Hyperlipidemia, unspecified: Secondary | ICD-10-CM

## 2013-10-07 DIAGNOSIS — E7800 Pure hypercholesterolemia, unspecified: Secondary | ICD-10-CM

## 2013-10-07 MED ORDER — EZETIMIBE 10 MG PO TABS: 10.0000 mg | ORAL_TABLET | Freq: Every day | ORAL | Status: DC

## 2013-10-07 NOTE — Progress Notes (Signed)
OFFICE NOTE  10/07/2013  CC:  Chief Complaint  Patient presents with  . Follow-up    4 months  . Medication Problem    pt stopped lipitor would like to go back on zetia   HPI: Patient is [name] who is here for 4 mo f/u HTN, hyperlipidemia, CAD s/p stents. Last visit we started lipitor and it causes musculoskeletal pain (left lower leg pain) after she was on it for about 6 wks so she stopped it.  Says this is exactly the pain that other statins have elicited.  She wants to restart zetia--says she tolerated this fine. Review of home bp measurements are normal for the most part: no low bps or bradycardia. No CP, no SOB, no palpitations, no dizziness.  Pertinent PMH:  Past medical, surgical, social, and family history reviewed and no changes are noted since last office visit.  MEDS: Currently not taking Zetia listed below Outpatient Prescriptions Prior to Visit  Medication Sig Dispense Refill  . ALPRAZolam (XANAX) 0.25 MG tablet Take 1 tablet (0.25 mg total) by mouth daily as needed. Panic attack  30 tablet  3  . aspirin 325 MG tablet Take 162.5 mg by mouth daily.       Marland Kitchen azelastine (OPTIVAR) 0.05 % ophthalmic solution Place 2 drops into both eyes 2 (two) times daily.  6 mL  12  . Benfotiamine 150 MG CAPS Take 1 capsule by mouth daily. For numbness in feet      . Calcium Carbonate-Vitamin D (CALCIUM 500 + D PO) Take by mouth once a week.       . Cyanocobalamin (VITAMIN B-12 CR PO) Take by mouth.      . DiphenhydrAMINE HCl (BENADRYL ALLERGY PO) Take by mouth as needed.      . enalapril (VASOTEC) 20 MG tablet Take 1T po in am and 1/2 tab po qhs.  135 tablet  3  . fluticasone (FLONASE) 50 MCG/ACT nasal spray Place 2 sprays into the nose daily.  16 g  6  . Ketotifen Fumarate (ALLERGY EYE DROPS OP) Place 2 drops into both eyes as needed. Red itchy eyes       . magnesium oxide (MAG-OX) 400 MG tablet Take 400 mg by mouth once a week.       . metoprolol succinate (TOPROL-XL)  25 MG 24 hr tablet Takes 1 tab daily  90 tablet  3  . Multiple Vitamin (MULTIVITAMIN) tablet Take 1 tablet by mouth daily. Takes occ      . NAFTIN 1 % cream       . Omega-3 Fatty Acids (FISH OIL PO) Take 1 capsule by mouth daily.       . sodium chloride (OCEAN) 0.65 % nasal spray Place 2 sprays into the nose as needed.      . Sodium Chloride-Sodium Bicarb (RA SINUS [place] NETI POT NA) Place into the nose daily. sinuses      . atorvastatin (LIPITOR) 20 MG tablet Take 1 tablet (20 mg total) by mouth daily.  30 tablet  3  . ezetimibe (ZETIA) 10 MG tablet Take 1 tablet (10 mg total) by mouth daily.  30 tablet  6  . levocetirizine (XYZAL) 5 MG tablet Take 1 tablet (5 mg total) by mouth every evening.  30 tablet  12  . OVER THE COUNTER MEDICATION Sinus buster- daily       No facility-administered medications prior to visit.    PE: Blood pressure 174/80, pulse 67, temperature  98.7 F (37.1 C), temperature source Temporal, resp. rate 18, height 5\' 1"  (1.549 m), weight 141 lb (63.957 kg), SpO2 97.00%. Gen: Alert, well appearing.  Patient is oriented to person, place, time, and situation. CV: RRR, no m/r/g.   LUNGS: CTA bilat, nonlabored resps, good aeration in all lung fields. EXT: no clubbing, cyanosis, or tenderness.  1+ pitting edema in both LL's.  IMPRESSION AND PLAN:  1) Hyperlipidemia: intolerant of multiple statins, pt does not want further trial of this type of med. She wants to restart zetia--I rx'd this for her today.  Will recheck FLP, CMET in next f/u in 4 mo.  2) HTN; The current medical regimen is effective;  continue present plan and medications.  3) CAD; asymptomatic.  Continue ASA, BP meds, restarting zetia. Keep routine cardiologist f/u.  4) Prev health: due for prevnar 13 after 12/17/2013.  An After Visit Summary was printed and given to the patient.  FOLLOW UP: 4 mo

## 2013-10-07 NOTE — Progress Notes (Signed)
Pre visit review using our clinic review tool, if applicable. No additional management support is needed unless otherwise documented below in the visit note. 

## 2013-10-20 ENCOUNTER — Ambulatory Visit: Payer: Medicare Other | Admitting: Cardiology

## 2013-11-17 ENCOUNTER — Encounter: Payer: Self-pay | Admitting: Cardiology

## 2013-11-17 ENCOUNTER — Ambulatory Visit (INDEPENDENT_AMBULATORY_CARE_PROVIDER_SITE_OTHER): Payer: Medicare Other | Admitting: Cardiology

## 2013-11-17 VITALS — BP 170/85 | HR 69 | Ht 62.0 in | Wt 141.8 lb

## 2013-11-17 DIAGNOSIS — E7800 Pure hypercholesterolemia, unspecified: Secondary | ICD-10-CM

## 2013-11-17 DIAGNOSIS — E785 Hyperlipidemia, unspecified: Secondary | ICD-10-CM

## 2013-11-17 DIAGNOSIS — I119 Hypertensive heart disease without heart failure: Secondary | ICD-10-CM

## 2013-11-17 DIAGNOSIS — I259 Chronic ischemic heart disease, unspecified: Secondary | ICD-10-CM

## 2013-11-17 DIAGNOSIS — I10 Essential (primary) hypertension: Secondary | ICD-10-CM

## 2013-11-17 MED ORDER — AMLODIPINE BESYLATE 2.5 MG PO TABS: 2.5000 mg | ORAL_TABLET | Freq: Every day | ORAL | Status: DC

## 2013-11-17 MED ORDER — METOPROLOL SUCCINATE ER 25 MG PO TB24: ORAL_TABLET | ORAL | Status: DC

## 2013-11-17 NOTE — Patient Instructions (Signed)
START AMLODIPINE 2.5 MG DAILY, RX SENT TO [facility]  Get OMRON blood pressure cuff and monitor your blood pressure, if no improvement let us know.   Your physician wants you to follow-up in: 1 year ov/ekg You will receive a reminder letter in the mail two months in advance. If you don't receive a letter, please call our office to schedule the follow-up appointment.

## 2013-11-17 NOTE — Assessment & Plan Note (Signed)
Her lipids are followed by Dr. Anitra Lauth.  She has not had any myalgias.  She is intolerant of statins.  She is on ezetimibe.

## 2013-11-17 NOTE — Assessment & Plan Note (Signed)
Blood pressure is higher today.  We rechecked it and it is still high.  We will start amlodipine 2.5 mg daily.

## 2013-11-17 NOTE — Assessment & Plan Note (Signed)
The patient exercises 3 days a week in the summer sneakers program.  She is not experiencing any exertional chest discomfort or angina.

## 2013-11-17 NOTE — Progress Notes (Signed)
Jennifer Peck Date of Birth:  10-20-1922 [facility] [address] [place], [place]  [address] [phone]        Fax   [phone]   History of Present Illness:  This pleasant 80 year old woman is seen for a scheduled followup office visit. She has a history of essential hypertension and a history of ischemic heart disease. She had 3 bare-metal stents placed on 12/20/09 into her mid left circumflex coronary artery, the second obtuse marginal vessel, and the first obtuse marginal vessel and into the mid right coronary artery. He has done well since then with no recurrent angina. Has occasional indigestion for which she takes Pepcid. She has not had to take any sublingual nitroglycerin. She continues to exercise regularly at the recreation center in [place] sneakers program.  She has a history of hypercholesterolemia. In the summer she enjoys swimming in [place]. She has a history of hypercholesterolemia but is statin intolerant. Her blood pressure is higher today.  Her home blood pressure cuff is no longer working and we recommended that she obtain a new one.   Current Outpatient Prescriptions  Medication Sig Dispense Refill  . ALPRAZolam (XANAX) 0.25 MG tablet Take 1 tablet (0.25 mg total) by mouth daily as needed. Panic attack  30 tablet  3  . aspirin 325 MG tablet Take 162.5 mg by mouth daily.       Marland Kitchen azelastine (OPTIVAR) 0.05 % ophthalmic solution Place 2 drops into both eyes 2 (two) times daily.  6 mL  12  . Benfotiamine 150 MG CAPS Take 1 capsule by mouth daily. For numbness in feet      . Calcium Carbonate-Vitamin D (CALCIUM 500 + D PO) Take by mouth once a week.       . Cyanocobalamin (VITAMIN B-12 CR PO) Take by mouth.      . DiphenhydrAMINE HCl (BENADRYL ALLERGY PO) Take by mouth as needed.      . enalapril (VASOTEC) 20 MG tablet Take 1T po in am and 1/2 tab po qhs.  135 tablet  3  . ezetimibe (ZETIA) 10 MG tablet Take 1 tablet (10 mg total) by  mouth daily.  90 tablet  3  . fluticasone (FLONASE) 50 MCG/ACT nasal spray Place 2 sprays into the nose daily.  16 g  6  . Ketotifen Fumarate (ALLERGY EYE DROPS OP) Place 2 drops into both eyes as needed. Red itchy eyes       . levocetirizine (XYZAL) 5 MG tablet Take 1 tablet (5 mg total) by mouth every evening.  30 tablet  12  . magnesium oxide (MAG-OX) 400 MG tablet Take 400 mg by mouth once a week.       . metoprolol succinate (TOPROL-XL) 25 MG 24 hr tablet Takes 1 tab daily  90 tablet  3  . Multiple Vitamin (MULTIVITAMIN) tablet Take 1 tablet by mouth daily. Takes occ      . NAFTIN 1 % cream       . Omega-3 Fatty Acids (FISH OIL PO) Take 1 capsule by mouth daily.       . sodium chloride (OCEAN) 0.65 % nasal spray Place 2 sprays into the nose as needed.      . Sodium Chloride-Sodium Bicarb (RA SINUS [place] NETI POT NA) Place into the nose daily. sinuses      . amLODipine (NORVASC) 2.5 MG tablet Take 1 tablet (2.5 mg total) by mouth daily.  90 tablet  3  No current facility-administered medications for this visit.    Allergies  Allergen Reactions  . Chlorthalidone     hyponatremia  . Atenolol     Caused fatigue   . Besivance [Besifloxacin Hcl]     rash  . Codeine Nausea And Vomiting  . Crestor [Rosuvastatin]     "leg gave away"  . Gabapentin     Memory loss and confusion  . Lipitor [Atorvastatin] Other (See Comments)    Severe leg pain  . Lovastatin     myalgia  . Simvastatin     Her leg gave away on her and states thinks secondary to Simvastatin  . Vigamox [Moxifloxacin]     rash  . Welchol [Colesevelam Hcl]     Patient Active Problem List   Diagnosis Date Noted  . Preventative health care 10/07/2013  . Allergic conjunctivitis 06/03/2013  . Esophageal reflux 05/11/2012  . Lesion of labia 02/04/2012  . Hyperlipidemia 04/30/2011  . Allergic state 04/30/2011  . Seborrheic keratosis 04/30/2011  . Hyperkalemia 01/23/2011  . Hyponatremia 11/23/2010  . Osteopenia  11/23/2010  . BCC (basal cell carcinoma), face 11/06/2010  . SCC (squamous cell carcinoma), arm 11/06/2010  . Bilateral bunions 11/06/2010  . Peripheral neuropathy 11/06/2010  . Constipation 11/06/2010  . Left leg pain 11/06/2010  . PAC (premature atrial contraction) 06/06/2010  . Encounter for long-term (current) use of other medications 06/06/2010  . Pure hypercholesterolemia 06/06/2010  . Ischemic heart disease 06/06/2010  . Hypertension   . Anxiety   . Vertigo     History  Smoking status  . Former Smoker  . Quit date: 09/04/1977  Smokeless tobacco  . Never Used    History  Alcohol Use  . Yes    Comment: only on Holidays    Family History  Problem Relation Age of Onset  . Stroke Mother   . Heart disease Father   . Hypertension Daughter   . Hyperlipidemia Daughter   . Hypertension Son   . Cancer Daughter     rectal/ chemo and radiation  . Hypertension Daughter   . Hypertension Son   . Hyperlipidemia Son   . Hypertension Son   . Vision loss Maternal Grandfather   . Stroke Paternal Grandmother   . Asthma Paternal Grandfather     Review of Systems: Constitutional: no fever chills diaphoresis or fatigue or change in weight.  Head and neck: no hearing loss, no epistaxis, no photophobia or visual disturbance. Respiratory: No cough, shortness of breath or wheezing. Cardiovascular: No chest pain peripheral edema, palpitations. Gastrointestinal: No abdominal distention, no abdominal pain, no change in bowel habits hematochezia or melena. Genitourinary: No dysuria, no frequency, no urgency, no nocturia. Musculoskeletal:No arthralgias, no back pain, no gait disturbance or myalgias. Neurological: No dizziness, no headaches, no numbness, no seizures, no syncope, no weakness, no tremors. Hematologic: No lymphadenopathy, no easy bruising. Psychiatric: No confusion, no hallucinations, no sleep disturbance.    Physical Exam: Filed Vitals:   11/17/13 1057  BP: 170/85    Pulse:    general appearance reveals a alert elderly woman in no distress.  Head and neck exam reveals that the pupils are equal and reactive.  The extraocular movements are full.  There is no scleral icterus.  Mouth and pharynx are benign.  No lymphadenopathy.  No carotid bruits.  The jugular venous pressure is normal.  Thyroid is not enlarged or tender.  Chest is clear to percussion and auscultation.  No rales or rhonchi.  Expansion of the  chest is symmetrical.  Heart reveals no abnormal lift or heave.  First and second heart sounds are normal.  There is no murmur gallop rub or click.  The abdomen is soft and nontender.  Bowel sounds are normoactive.  There is no hepatosplenomegaly or mass.  There are no abdominal bruits.  Extremities reveal no phlebitis or edema.  Pedal pulses are good.  There is no cyanosis or clubbing.  Neurologic exam is normal strength and no lateralizing weakness.  No sensory deficits.  Integument reveals no rash  EKG today shows normal sinus rhythm with occasional PVC.  No ischemic changes  Assessment / Plan: 1. ischemic heart disease status post 3 bare-metal stents placed on 12/20/09 into her mid left circumflex, second obtuse marginal, and first obtuse marginal vessels and into the right coronary artery. 2. Hypercholesterolemia 3. essential hypertension without heart failure  Disposition: Continue same medication.  Start amlodipine 2.5 mg one daily.  Follow up with PCP Dr. Anitra Lauth.  Recheck here in one year for office visit and EKG.

## 2013-12-07 ENCOUNTER — Ambulatory Visit (INDEPENDENT_AMBULATORY_CARE_PROVIDER_SITE_OTHER): Payer: Medicare Other | Admitting: Family Medicine

## 2013-12-07 ENCOUNTER — Encounter: Payer: Self-pay | Admitting: Family Medicine

## 2013-12-07 VITALS — BP 174/80 | HR 70 | Temp 99.3°F | Resp 18 | Ht 61.0 in | Wt 140.0 lb

## 2013-12-07 DIAGNOSIS — M1990 Unspecified osteoarthritis, unspecified site: Secondary | ICD-10-CM | POA: Insufficient documentation

## 2013-12-07 DIAGNOSIS — I259 Chronic ischemic heart disease, unspecified: Secondary | ICD-10-CM

## 2013-12-07 DIAGNOSIS — S86911A Strain of unspecified muscle(s) and tendon(s) at lower leg level, right leg, initial encounter: Secondary | ICD-10-CM | POA: Insufficient documentation

## 2013-12-07 DIAGNOSIS — I10 Essential (primary) hypertension: Secondary | ICD-10-CM

## 2013-12-07 DIAGNOSIS — M170 Bilateral primary osteoarthritis of knee: Secondary | ICD-10-CM

## 2013-12-07 DIAGNOSIS — IMO0002 Reserved for concepts with insufficient information to code with codable children: Secondary | ICD-10-CM

## 2013-12-07 DIAGNOSIS — M1710 Unilateral primary osteoarthritis, unspecified knee: Secondary | ICD-10-CM

## 2013-12-07 NOTE — Progress Notes (Addendum)
OFFICE NOTE  12/07/2013  CC:  Chief Complaint  Patient presents with  . Knee Pain    Right knee     HPI: Patient is [name] who is here for right knee pain. Exercising 3 days a week at rec center, was doing some new exercises and flexed/bent knee and felt it give way about 3 wks ago.  Has felt pain in knee since then.  Worse with wt bearing, seems to be more bothersome last few days.  It has not swollen.   Some catching/giving way last few days. Has been taking advil some days, tylenol some days.  She takes low doses of advil when she does take it.  She has not taken norvasc recently rx'd by her cardiologist---was on vacation--now will be starting it.  Pertinent PMH:  Past medical, surgical, social, and family history reviewed and no changes are noted since last office visit.  MEDS:  Outpatient Prescriptions Prior to Visit  Medication Sig Dispense Refill  . ALPRAZolam (XANAX) 0.25 MG tablet Take 1 tablet (0.25 mg total) by mouth daily as needed. Panic attack  30 tablet  3  . aspirin 325 MG tablet Take 162.5 mg by mouth daily.       Marland Kitchen azelastine (OPTIVAR) 0.05 % ophthalmic solution Place 2 drops into both eyes 2 (two) times daily.  6 mL  12  . Benfotiamine 150 MG CAPS Take 1 capsule by mouth daily. For numbness in feet      . DiphenhydrAMINE HCl (BENADRYL ALLERGY PO) Take by mouth as needed.      . enalapril (VASOTEC) 20 MG tablet Take 1T po in am and 1/2 tab po qhs.  135 tablet  3  . ezetimibe (ZETIA) 10 MG tablet Take 1 tablet (10 mg total) by mouth daily.  90 tablet  3  . fluticasone (FLONASE) 50 MCG/ACT nasal spray Place 2 sprays into the nose daily.  16 g  6  . Ketotifen Fumarate (ALLERGY EYE DROPS OP) Place 2 drops into both eyes as needed. Red itchy eyes       . magnesium oxide (MAG-OX) 400 MG tablet Take 400 mg by mouth once a week.       . metoprolol succinate (TOPROL-XL) 25 MG 24 hr tablet Takes 1 tab daily  90 tablet  3  . Multiple Vitamin (MULTIVITAMIN)  tablet Take 1 tablet by mouth daily. Takes occ      . NAFTIN 1 % cream       . Omega-3 Fatty Acids (FISH OIL PO) Take 1 capsule by mouth daily.       . sodium chloride (OCEAN) 0.65 % nasal spray Place 2 sprays into the nose as needed.      . Sodium Chloride-Sodium Bicarb (RA SINUS [place] NETI POT NA) Place into the nose daily. sinuses      . amLODipine (NORVASC) 2.5 MG tablet Take 1 tablet (2.5 mg total) by mouth daily.  90 tablet  3  . Calcium Carbonate-Vitamin D (CALCIUM 500 + D PO) Take by mouth once a week.       . Cyanocobalamin (VITAMIN B-12 CR PO) Take by mouth.      . levocetirizine (XYZAL) 5 MG tablet Take 1 tablet (5 mg total) by mouth every evening.  30 tablet  12   No facility-administered medications prior to visit.    PE: Blood pressure 174/80, pulse 70, temperature 99.3 F (37.4 C), temperature source Temporal, resp. rate 18, height 5\' 1"  (  1.549 m), weight 140 lb (63.504 kg), SpO2 97.00%. Gen: Alert, well appearing.  Patient is oriented to person, place, time, and situation. Knees both show generalized bony hypertrophy without erythema, or swelling/effusion. Right knee with mild anterior warmth to palpation. ROM: right knee ROM intact but at the extremes of flexion and extension she feels pain in anterior/lateral knee, worse at full extension.   Tender over patellar tendon diffusely, with mild peripatellar soft tissue tenderness and mild right knee lateral joint line tenderness with some palpable crepitus in lateral joint line.  No suprapatellar swelling and negative patellar grind.   No knee instability.   LABS: none today Recent:    Chemistry      Component Value Date/Time   NA 135 06/03/2013 1009   K 4.2 06/03/2013 1009   CL 97 06/03/2013 1009   CO2 28 06/03/2013 1009   BUN 14 06/03/2013 1009   CREATININE 0.8 06/03/2013 1009   CREATININE 0.78 06/06/2010 1745      Component Value Date/Time   CALCIUM 9.4 06/03/2013 1009   ALKPHOS 54 05/07/2012 1044   AST 15 12/02/2012 0837    ALT 20 12/02/2012 0837   BILITOT 0.6 05/07/2012 1044     IMPRESSION AND PLAN:  1) Right knee pain: multifactorial.  Acute osteoarthritis flare, also patellar tendonitis. Due to her HTN that is currently not well controlled, she is not a good NSAID candidate. Decided on right knee intraarticular steroid injection today, with knee sleeve for comfort/support fitted in office today.  Ice, tylenol use discussed, also needs to take 1 week off from her usual exercise. No imaging at this time.   Procedure: Therapeutic knee injection (right knee).  The patient's clinical condition is marked by substantial pain and/or significant functional disability.  Other conservative therapy has not provided relief, is contraindicated, or not appropriate.  There is a reasonable likelihood that injection will significantly improve the patient's pain and/or functional disability. Cleaned skin with alcohol swab, used anterior approach with pt sitting upright to enter knee joint, Injected 1 ml of 40mg /ml depo medrol + 2 ml of 1% lidocaine w/out epi into joint space without resistance.  No immediate complications.  Patient tolerated procedure well.  Post-injection care discussed, including 20 min of icing 1-2 times in the next 4-8 hours, frequent non weight-bearing ROM exercises over the next few days, and general pain medication management.  2) HTN, not ideal control.  Patient will be starting amlodipine rx recently given by her cardiologist, [name].  An After Visit Summary was printed and given to the patient.  FOLLOW UP prn

## 2013-12-07 NOTE — Progress Notes (Signed)
Pre visit review using our clinic review tool, if applicable. No additional management support is needed unless otherwise documented below in the visit note. 

## 2013-12-24 ENCOUNTER — Ambulatory Visit (INDEPENDENT_AMBULATORY_CARE_PROVIDER_SITE_OTHER): Payer: Medicare Other | Admitting: Family Medicine

## 2013-12-24 ENCOUNTER — Encounter: Payer: Self-pay | Admitting: Family Medicine

## 2013-12-24 VITALS — BP 130/82 | HR 68 | Temp 99.0°F | Resp 18 | Ht 61.0 in | Wt 139.0 lb

## 2013-12-24 DIAGNOSIS — I259 Chronic ischemic heart disease, unspecified: Secondary | ICD-10-CM

## 2013-12-24 DIAGNOSIS — J069 Acute upper respiratory infection, unspecified: Secondary | ICD-10-CM

## 2013-12-24 MED ORDER — BENZONATATE 200 MG PO CAPS: 200.0000 mg | ORAL_CAPSULE | Freq: Two times a day (BID) | ORAL | Status: DC | PRN

## 2013-12-24 NOTE — Progress Notes (Signed)
Pre visit review using our clinic review tool, if applicable. No additional management support is needed unless otherwise documented below in the visit note. 

## 2013-12-24 NOTE — Progress Notes (Signed)
OFFICE NOTE  12/24/2013  CC:  Chief Complaint  Patient presents with  . Cough    x yesterday, worse at night  . Nasal Congestion   HPI: Patient is [name], smoker in the remote past, who is here for respiratory symptoms. "Head is full of snot".  Nose/sinus drainage, "coughed and coughed all night".   All sx's started yesterday.  No fever.  She feels PND.  Denies chest tightness, wheezing, or SOB. No OTC cough med.  She does neti pot daily but takes nasocort OTC only prn (took it this morning).  Not taking xyzal as prescribed.  Pertinent PMH:  Past medical, surgical, social, and family history reviewed and no changes are noted since last office visit.  MEDS:  Outpatient Prescriptions Prior to Visit  Medication Sig Dispense Refill  . ALPRAZolam (XANAX) 0.25 MG tablet Take 1 tablet (0.25 mg total) by mouth daily as needed. Panic attack  30 tablet  3  . amLODipine (NORVASC) 2.5 MG tablet Take 1 tablet (2.5 mg total) by mouth daily.  90 tablet  3  . aspirin 325 MG tablet Take 162.5 mg by mouth daily.       Marland Kitchen azelastine (OPTIVAR) 0.05 % ophthalmic solution Place 2 drops into both eyes 2 (two) times daily.  6 mL  12  . Benfotiamine 150 MG CAPS Take 1 capsule by mouth daily. For numbness in feet      . Calcium Carbonate-Vitamin D (CALCIUM 500 + D PO) Take by mouth once a week.       . Cyanocobalamin (VITAMIN B-12 CR PO) Take by mouth.      . DiphenhydrAMINE HCl (BENADRYL ALLERGY PO) Take by mouth as needed.      . enalapril (VASOTEC) 20 MG tablet Take 1T po in am and 1/2 tab po qhs.  135 tablet  3  . ezetimibe (ZETIA) 10 MG tablet Take 1 tablet (10 mg total) by mouth daily.  90 tablet  3  . fluticasone (FLONASE) 50 MCG/ACT nasal spray Place 2 sprays into the nose daily.  16 g  6  . Ketotifen Fumarate (ALLERGY EYE DROPS OP) Place 2 drops into both eyes as needed. Red itchy eyes       . levocetirizine (XYZAL) 5 MG tablet Take 1 tablet (5 mg total) by mouth every evening.   30 tablet  12  . magnesium oxide (MAG-OX) 400 MG tablet Take 400 mg by mouth once a week.       . metoprolol succinate (TOPROL-XL) 25 MG 24 hr tablet Takes 1 tab daily  90 tablet  3  . Multiple Vitamin (MULTIVITAMIN) tablet Take 1 tablet by mouth daily. Takes occ      . NAFTIN 1 % cream       . Omega-3 Fatty Acids (FISH OIL PO) Take 1 capsule by mouth daily.       . sodium chloride (OCEAN) 0.65 % nasal spray Place 2 sprays into the nose as needed.      . Sodium Chloride-Sodium Bicarb (RA SINUS [place] NETI POT NA) Place into the nose daily. sinuses       No facility-administered medications prior to visit.    PE: Blood pressure 130/82, pulse 68, temperature 99 F (37.2 C), temperature source Temporal, resp. rate 18, height 5\' 1"  (1.549 m), weight 139 lb (63.05 kg), SpO2 96.00%. VS: noted--normal. Gen: alert, NAD, NONTOXIC APPEARING. HEENT: eyes without injection, drainage, or swelling.  Ears: EACs clear, TMs with normal  light reflex and landmarks.  Nose: Clear rhinorrhea, with some dried, crusty exudate adherent to mildly injected mucosa.  No purulent d/c.  No paranasal sinus TTP.  No facial swelling.  Throat and mouth without focal lesion.  No pharyngial swelling, erythema, or exudate.   Neck: supple, no LAD.   LUNGS: CTA bilat, nonlabored resps.   CV: RRR, no m/r/g. EXT: no c/c/e SKIN: no rash    IMPRESSION AND PLAN:  Viral URI. Continue current symptomatic care. Add xyzal 5mg  qd that she already has at home. Rx for tessalon perles 200 mg q8h prn sent to her pharmacy today.  An After Visit Summary was printed and given to the patient.  FOLLOW UP: prn

## 2014-01-05 ENCOUNTER — Encounter: Payer: Self-pay | Admitting: Nurse Practitioner

## 2014-01-05 ENCOUNTER — Ambulatory Visit (INDEPENDENT_AMBULATORY_CARE_PROVIDER_SITE_OTHER): Payer: Medicare Other | Admitting: Nurse Practitioner

## 2014-01-05 VITALS — BP 163/78 | HR 70 | Temp 97.4°F | Ht 61.0 in | Wt 138.0 lb

## 2014-01-05 DIAGNOSIS — I259 Chronic ischemic heart disease, unspecified: Secondary | ICD-10-CM

## 2014-01-05 DIAGNOSIS — S81811A Laceration without foreign body, right lower leg, initial encounter: Secondary | ICD-10-CM

## 2014-01-05 DIAGNOSIS — M25561 Pain in right knee: Secondary | ICD-10-CM

## 2014-01-05 DIAGNOSIS — S81819A Laceration without foreign body, unspecified lower leg, initial encounter: Secondary | ICD-10-CM | POA: Insufficient documentation

## 2014-01-05 NOTE — Patient Instructions (Signed)
Use ice pack on wound for 10 minutes 2 or 3 times today.  Keep bandage in place. We will remove it on Friday.  Do not remove unless it gets wet. If it gets wet, apply thin layer vaseline, cover with gauze, wrap snugly. If it bleeds through bandage, wrap another roll of ace wrap over bandage. If it bleeds through that, go to ER or call us tomorrow.  Apply aspercream to knee 3 times daily (front & both sides of knee). See Dr Anitra Lauth if steroid injection was not helpful.  Nice to see you!

## 2014-01-05 NOTE — Progress Notes (Signed)
Pre visit review using our clinic review tool, if applicable. No additional management support is needed unless otherwise documented below in the visit note. 

## 2014-01-05 NOTE — Progress Notes (Signed)
Subjective:     Jennifer Peck is a 80 y.o. female presents after falling at home. She fell against edge of brick porch, resulting in skin tear of anterior R shin. She cleaned & bandaged wound at home. She denies any other injury or pain resulting from fall.  She also c/o R knee pain-started after she heard "pop" in knee at exercise class. Dr Anitra Lauth put steroid injection in knee several weeks ago, per pt. She has not had relief.  The following portions of the patient's history were reviewed and updated as appropriate: allergies, current medications, past medical history, past social history, past surgical history and problem list.  Review of Systems Pertinent items are noted in HPI.    Objective:    BP 163/78  Pulse 70  Temp(Src) 97.4 F (36.3 C) (Temporal)  Ht 5\' 1"  (1.549 m)  Wt 138 lb (62.596 kg)  BMI 26.09 kg/m2  SpO2 96% BP 163/78  Pulse 70  Temp(Src) 97.4 F (36.3 C) (Temporal)  Ht 5\' 1"  (1.549 m)  Wt 138 lb (62.596 kg)  BMI 26.09 kg/m2  SpO2 96% General appearance: alert, cooperative, appears stated age and no distress Head: Normocephalic, without obvious abnormality, atraumatic Eyes: negative findings: lids and lashes normal and conjunctivae and sclerae normal Extremities: R shin has 2 skin tears-minimal active bleeding, stopped after applying pressure. Wound care & dressing performed. See procedure note. R knee slightly warm, tender at medial aspect. No effusion. Pulses: 2+ and symmetric Skin: see extremities note    Assessment:   1. Skin tear of lower leg without complication, right, initial encounter Fell against brick porch Wound care performed F/u 3 days, wound check  2. Right knee pain aspercream tid, f/u Dr Anitra Lauth

## 2014-01-08 ENCOUNTER — Ambulatory Visit (INDEPENDENT_AMBULATORY_CARE_PROVIDER_SITE_OTHER): Payer: Medicare Other | Admitting: Nurse Practitioner

## 2014-01-08 ENCOUNTER — Encounter: Payer: Self-pay | Admitting: Nurse Practitioner

## 2014-01-08 VITALS — BP 163/89 | HR 97 | Temp 97.4°F | Ht 61.0 in | Wt 141.0 lb

## 2014-01-08 DIAGNOSIS — S81811D Laceration without foreign body, right lower leg, subsequent encounter: Secondary | ICD-10-CM

## 2014-01-08 DIAGNOSIS — I259 Chronic ischemic heart disease, unspecified: Secondary | ICD-10-CM

## 2014-01-08 DIAGNOSIS — J069 Acute upper respiratory infection, unspecified: Secondary | ICD-10-CM

## 2014-01-08 NOTE — Assessment & Plan Note (Signed)
No signs of infection. Scant brown blood on old bandage. Cleaned w/saline. Patted dry. Applied vaseline gauze, nonadherent dsg, ace wrap Instructed on swound care F/U PRN S&S infection.

## 2014-01-08 NOTE — Progress Notes (Signed)
Subjective:     Jennifer Peck is a 80 y.o. female presents for follow up of skinTear to LE. SHe was seen last week for intial encounter. Wound was cleaned & dressed. Today old banndage is removed, wound assessed & dressed. Further instructions for wound care given orally & in writing. She denies fever, chills, wound pain, drainage. She also c/o nasal drainage & HA for 1 week-states getting better. Denies sore throat, cough. She has less nasal quality to voice than she did last week.  The following portions of the patient's history were reviewed and updated as appropriate: allergies, current medications, past medical history, past social history, past surgical history and problem list.  Review of Systems Pertinent items are noted in HPI.    Objective:    BP 163/89  Pulse 97  Temp(Src) 97.4 F (36.3 C) (Temporal)  Ht 5\' 1"  (1.549 m)  Wt 141 lb (63.957 kg)  BMI 26.66 kg/m2  SpO2 97% BP 163/89 mmHg  Pulse 97  Temp(Src) 97.4 F (36.3 C) (Temporal)  Ht 5\' 1"  (1.549 m)  Wt 141 lb (63.957 kg)  BMI 26.66 kg/m2  SpO2 97% General appearance: alert, cooperative, appears stated age and no distress Head: Normocephalic, without obvious abnormality, atraumatic Eyes: negative findings: lids and lashes normal and conjunctivae and sclerae normal  Throat: nml mucosa, no exudate tonsils Skin: 2 wounds anterior shin, both pink, skin flap adherent. no erythema surrounding wound, scant brown drainage on old bandage. No acitve bleeding.     Assessment:     1. Skin tear of lower leg without complication, right, subsequent encounter  2. Viral URI  See pt instructions. F/u PRN

## 2014-01-08 NOTE — Patient Instructions (Signed)
Keep wound dressed as is for 3 to 5 days.  Then shower-ok to let water run over wound. Pat dry thoroughly. Apply vaseline gauze or thin layer vaseline. Cover with non-adherent dressing. Wrap loosley with ACE bandage.  Change every 3 to 5 days for next 2 weeks. Then as needed.  Let us know if you develop pain, drainage, redness around wound.  Continue with sinus rinses daily for nasal congestion.

## 2014-01-15 ENCOUNTER — Telehealth: Payer: Self-pay | Admitting: *Deleted

## 2014-01-15 NOTE — Telephone Encounter (Signed)
Patient notified. Patient stated that wound is only bleeding a little, mostly on bandage. Patient will call back after weekend to let us know how wound is looking.

## 2014-01-15 NOTE — Telephone Encounter (Signed)
Wound was not bleeding in ofc. Ask questions to determine how much bleeding-dripping? Just on old bandage? How often is she changing it? Same directions unless she is having to change dressing twice daily due to bleeding coming through bandage. If this is happening, she should go to Sat clinic tomorrow for wound check.

## 2014-01-15 NOTE — Telephone Encounter (Signed)
Patient called office concerning wound on her leg. Patient stated that she changed her bandage to day and the wound is still bleeding. Patient wanted to know if she should continue caring for wound as you instructed or if she should do anything different. Please advise?

## 2014-01-18 NOTE — Telephone Encounter (Signed)
Patient called back this afternoon to let us know that her wound looked a lot better today. Patient stated that she will continue with advised wound care.

## 2014-01-21 ENCOUNTER — Ambulatory Visit (INDEPENDENT_AMBULATORY_CARE_PROVIDER_SITE_OTHER): Payer: Medicare Other | Admitting: Nurse Practitioner

## 2014-01-21 ENCOUNTER — Encounter: Payer: Self-pay | Admitting: Nurse Practitioner

## 2014-01-21 VITALS — BP 117/68 | HR 58 | Temp 98.5°F | Resp 18 | Ht 61.0 in | Wt 140.0 lb

## 2014-01-21 DIAGNOSIS — S81801D Unspecified open wound, right lower leg, subsequent encounter: Secondary | ICD-10-CM

## 2014-01-21 DIAGNOSIS — I259 Chronic ischemic heart disease, unspecified: Secondary | ICD-10-CM

## 2014-01-21 DIAGNOSIS — Z23 Encounter for immunization: Secondary | ICD-10-CM

## 2014-01-21 NOTE — Progress Notes (Signed)
Subjective:     Jennifer Peck is a 80 y.o. female presents for follow up of large deep skin tear to anterior R LE. Wound occurred 16 days ago when she fell against edge of brick porch. Pt was seen same day. Wound was irrigated, skin flap pulled over wound, & dressed w/vaseline gauze. She followed up 3 days later for wound assessment & dressing change. She has been changing dressing every 3 days & applying vaseline as instructed. She denies signs of infection, fever, chills, drainage, erythema & pain.  The following portions of the patient's history were reviewed and updated as appropriate: allergies, current medications, past medical history, past social history, past surgical history and problem list.  Review of Systems Pertinent items are noted in HPI.    Objective:    BP 117/68 mmHg  Pulse 58  Temp(Src) 98.5 F (36.9 C) (Oral)  Resp 18  Ht 5\' 1"  (1.549 m)  Wt 140 lb (63.504 kg)  BMI 26.47 kg/m2  SpO2 96% BP 117/68 mmHg  Pulse 58  Temp(Src) 98.5 F (36.9 C) (Oral)  Resp 18  Ht 5\' 1"  (1.549 m)  Wt 140 lb (63.504 kg)  BMI 26.47 kg/m2  SpO2 96% General appearance: alert, cooperative, appears stated age and no distress Head: Normocephalic, without obvious abnormality, atraumatic Eyes: negative findings: lids and lashes normal and conjunctivae and sclerae normal Extremities: no edema. See skin for wound assessment. Pulses: 2+ and symmetric Skin: no drainage on old bandage. No active bleeding or drainage from wound. Skin flap has ahered nicely. It does not cover entire wound. Open edge is pink & moist. Some sloughing skin is notedbelow wound & over flap. Minimal tenderness to palpate around wound. Unable to express drainage.     Procedure Note Wound care: irrigated w/20 mls saline using 23 ga needle. Debrided wound by applying gentle friction using hydrogen peroxide soaked gauze. Removed all sloughing skin. Applied thin layer bacitracin. Covered w/non-adherent gauze. Secured w/paper  tape. Demonstrated wound care, provided written instructions.   Assessment:     1. Wound, open, leg, right, subsequent encounter Start daily dressing changes.  See patient instructions for complete plan. F/u 1 week.  2. Flu vaccine need - Flu Vaccine QUAD 36+ mos PF IM (Fluarix Quad PF)

## 2014-01-21 NOTE — Progress Notes (Signed)
Pre visit review using our clinic review tool, if applicable. No additional management support is needed unless otherwise documented below in the visit note. 

## 2014-01-21 NOTE — Patient Instructions (Signed)
Start daily wound care: wash with soap & water when in shower. When not in shower, clean w/gauze soaked w/hydrogen peroxide. Start in center & circle out. Do this 3 times. Pat dry. Apply thin lyer bacitracin. Cover with non-adherent gauze. Secure with paper tape.  Follow up in office in 1 week.

## 2014-01-28 ENCOUNTER — Ambulatory Visit (INDEPENDENT_AMBULATORY_CARE_PROVIDER_SITE_OTHER): Payer: Medicare Other | Admitting: Nurse Practitioner

## 2014-01-28 ENCOUNTER — Encounter: Payer: Self-pay | Admitting: Nurse Practitioner

## 2014-01-28 VITALS — BP 124/72 | HR 73 | Temp 98.1°F | Resp 18 | Ht 61.0 in | Wt 140.0 lb

## 2014-01-28 DIAGNOSIS — T148 Other injury of unspecified body region: Secondary | ICD-10-CM

## 2014-01-28 DIAGNOSIS — T148XXA Other injury of unspecified body region, initial encounter: Secondary | ICD-10-CM

## 2014-01-28 DIAGNOSIS — I259 Chronic ischemic heart disease, unspecified: Secondary | ICD-10-CM

## 2014-01-28 DIAGNOSIS — L24A9 Irritant contact dermatitis due friction or contact with other specified body fluids: Secondary | ICD-10-CM

## 2014-01-28 NOTE — Progress Notes (Signed)
Pre visit review using our clinic review tool, if applicable. No additional management support is needed unless otherwise documented below in the visit note. 

## 2014-01-28 NOTE — Patient Instructions (Addendum)
Clean wound twice daily using gentle friction, soap & water. Hydrogen peroxide is fine. Once yellow exudate has cleared, decrease to once daily wash w/soap & water.  Let us know if you develop redness around wound, streaks, increased swelling, or fever.  We will call with culture results.  Happy Thanksgiving!

## 2014-01-29 ENCOUNTER — Encounter: Payer: Self-pay | Admitting: Nurse Practitioner

## 2014-01-29 NOTE — Progress Notes (Signed)
Subjective:     Jennifer Peck is a 80 y.o. female returns for f/u of leg wound. She had large skin tear R anterior lower leg, resulted from fall against brick porch. Wound was cleaned & dressed in ofc same day. At last f/u wound was healing nicely, but had one area that still wasn't approximated. She returns today to assess wound healing & inspect for infection. She has been changing dressing daily-washing w/soap, water & gentle friction, pouring hydrogen peroxide on wound as well. Pain is minimal. Has returned to normal exercises.    The following portions of the patient's history were reviewed and updated as appropriate: allergies, current medications, past medical history, past social history, past surgical history and problem list.  Review of Systems Constitutional: negative for chills and fevers Musculoskeletal:positive for healing wound R LE    Objective:    BP 124/72 mmHg  Pulse 73  Temp(Src) 98.1 F (36.7 C) (Oral)  Resp 18  Ht 5\' 1"  (1.549 m)  Wt 140 lb (63.504 kg)  BMI 26.47 kg/m2  SpO2 97% BP 124/72 mmHg  Pulse 73  Temp(Src) 98.1 F (36.7 C) (Oral)  Resp 18  Ht 5\' 1"  (1.549 m)  Wt 140 lb (63.504 kg)  BMI 26.47 kg/m2  SpO2 97% General appearance: alert, cooperative, appears stated age and no distress Head: Normocephalic, without obvious abnormality, atraumatic Eyes: negative findings: lids and lashes normal and conjunctivae and sclerae normal Extremities: mild swelling below wound. pedal pulse +2 R LE Skin: pink & yellow wound R LE. Wound is 85% approximated. Nonhealed area has scant yellow exudate. Pink wound bed. no erythema surrounding wound.    Procedure: wound care-cleaned w/ hyrogen peroxide & gentle friction. Applied non-adherent gauze, secured w/paper tape.   Assessment:plan     1. Wound exudate without odor Cleaned, cultured, dressed, instructed on wound care-demonstrated & gave written instructions. - Wound culture  F/U PRN s&s infection

## 2014-01-31 LAB — WOUND CULTURE
GRAM STAIN: NONE SEEN
Gram Stain: NONE SEEN
Organism ID, Bacteria: NO GROWTH

## 2014-02-01 ENCOUNTER — Telehealth: Payer: Self-pay | Admitting: Nurse Practitioner

## 2014-02-01 NOTE — Telephone Encounter (Signed)
pls call pt: Advise No bacteria growing in wound. Continue with instructions given at OV last week.

## 2014-02-01 NOTE — Telephone Encounter (Signed)
Spoke with pt, advised wound culture negative. Pt understood.

## 2014-02-10 ENCOUNTER — Encounter: Payer: Self-pay | Admitting: Family Medicine

## 2014-02-10 ENCOUNTER — Ambulatory Visit (INDEPENDENT_AMBULATORY_CARE_PROVIDER_SITE_OTHER): Payer: Medicare Other | Admitting: Family Medicine

## 2014-02-10 VITALS — BP 157/79 | HR 73 | Temp 98.6°F | Resp 18 | Ht 61.0 in | Wt 140.0 lb

## 2014-02-10 DIAGNOSIS — M1711 Unilateral primary osteoarthritis, right knee: Secondary | ICD-10-CM

## 2014-02-10 DIAGNOSIS — Z23 Encounter for immunization: Secondary | ICD-10-CM

## 2014-02-10 DIAGNOSIS — E785 Hyperlipidemia, unspecified: Secondary | ICD-10-CM

## 2014-02-10 DIAGNOSIS — I2510 Atherosclerotic heart disease of native coronary artery without angina pectoris: Secondary | ICD-10-CM

## 2014-02-10 DIAGNOSIS — I259 Chronic ischemic heart disease, unspecified: Secondary | ICD-10-CM

## 2014-02-10 DIAGNOSIS — I10 Essential (primary) hypertension: Secondary | ICD-10-CM

## 2014-02-10 LAB — LIPID PANEL
CHOL/HDL RATIO: 3
Cholesterol: 217 mg/dL — ABNORMAL HIGH (ref 0–200)
HDL: 66.6 mg/dL (ref >39.00)
LDL Cholesterol: 136 mg/dL — ABNORMAL HIGH (ref 0–99)
NONHDL: 150.4
Triglycerides: 73 mg/dL (ref 0.0–149.0)
VLDL: 14.6 mg/dL (ref 0.0–40.0)

## 2014-02-10 LAB — COMPREHENSIVE METABOLIC PANEL
ALK PHOS: 50 U/L (ref 39–117)
ALT: 15 U/L (ref 0–35)
AST: 15 U/L (ref 0–37)
Albumin: 4.4 g/dL (ref 3.5–5.2)
BILIRUBIN TOTAL: 0.8 mg/dL (ref 0.2–1.2)
BUN: 17 mg/dL (ref 6–23)
CO2: 25 mEq/L (ref 19–32)
Calcium: 9.3 mg/dL (ref 8.4–10.5)
Chloride: 96 mEq/L (ref 96–112)
Creatinine, Ser: 0.8 mg/dL (ref 0.4–1.2)
GFR: 73.87 mL/min (ref >60.00)
Glucose, Bld: 90 mg/dL (ref 70–99)
Potassium: 4.4 mEq/L (ref 3.5–5.1)
SODIUM: 130 meq/L — AB (ref 135–145)
TOTAL PROTEIN: 7 g/dL (ref 6.0–8.3)

## 2014-02-10 NOTE — Addendum Note (Signed)
Addended by: Jannette Spanner on: 02/10/2014 09:15 AM   Modules accepted: Orders

## 2014-02-10 NOTE — Progress Notes (Signed)
OFFICE NOTE  02/10/2014  CC:  Chief Complaint  Patient presents with  . Follow-up    fasting  . Knee Pain    patient requesting PT    HPI: Patient is [name] who is here for 4 mo f/u HTN, hyperlipidemia, CAD s/p stents. On zetia b/c statin intolerant (myalgias).  She wants PT for right knee.  The injection I did in R knee a couple of months ago helped some but not much.  Hurts with lots of walking/bending, sometimes swells and gets warm. She takes tylenol when it is bad but nothing regularly.  Home bp's normal per pt.  Says norvasc is making a difference.  ROS: no chest pain/SOB/dizziness/or LE swelling.  Pertinent PMH:  Past medical, surgical, social, and family history reviewed and no changes are noted since last office visit.  MEDS:  Outpatient Prescriptions Prior to Visit  Medication Sig Dispense Refill  . ALPRAZolam (XANAX) 0.25 MG tablet Take 1 tablet (0.25 mg total) by mouth daily as needed. Panic attack 30 tablet 3  . amLODipine (NORVASC) 2.5 MG tablet Take 1 tablet (2.5 mg total) by mouth daily. 90 tablet 3  . aspirin 325 MG tablet Take 162.5 mg by mouth daily.     Marland Kitchen azelastine (OPTIVAR) 0.05 % ophthalmic solution Place 2 drops into both eyes 2 (two) times daily. 6 mL 12  . Benfotiamine 150 MG CAPS Take 1 capsule by mouth daily. For numbness in feet    . Cyanocobalamin (VITAMIN B-12 CR PO) Take by mouth.    . DiphenhydrAMINE HCl (BENADRYL ALLERGY PO) Take by mouth as needed.    . enalapril (VASOTEC) 20 MG tablet Take 1T po in am and 1/2 tab po qhs. 135 tablet 3  . ezetimibe (ZETIA) 10 MG tablet Take 1 tablet (10 mg total) by mouth daily. 90 tablet 3  . fluticasone (FLONASE) 50 MCG/ACT nasal spray Place 2 sprays into the nose daily. 16 g 6  . Ketotifen Fumarate (ALLERGY EYE DROPS OP) Place 2 drops into both eyes as needed. Red itchy eyes     . levocetirizine (XYZAL) 5 MG tablet Take 1 tablet (5 mg total) by mouth every evening. 30 tablet 12  .  magnesium oxide (MAG-OX) 400 MG tablet Take 400 mg by mouth once a week.     . metoprolol succinate (TOPROL-XL) 25 MG 24 hr tablet Takes 1 tab daily 90 tablet 3  . Multiple Vitamin (MULTIVITAMIN) tablet Take 1 tablet by mouth daily. Takes occ    . NAFTIN 1 % cream     . Omega-3 Fatty Acids (FISH OIL PO) Take 1 capsule by mouth daily.     . sodium chloride (OCEAN) 0.65 % nasal spray Place 2 sprays into the nose as needed.    . Sodium Chloride-Sodium Bicarb (RA SINUS [place] NETI POT NA) Place into the nose daily. sinuses    . benzonatate (TESSALON) 200 MG capsule Take 1 capsule (200 mg total) by mouth 2 (two) times daily as needed for cough. (Patient not taking: Reported on 02/10/2014) 20 capsule 0  . Calcium Carbonate-Vitamin D (CALCIUM 500 + D PO) Take by mouth once a week.      No facility-administered medications prior to visit.    PE: Blood pressure 157/79, pulse 73, temperature 98.6 F (37 C), temperature source Temporal, resp. rate 18, height 5\' 1"  (1.549 m), weight 140 lb (63.504 kg), SpO2 96 %. Gen: Alert, well appearing.  Patient is oriented to person, place,  time, and situation. AFFECT: pleasant, lucid thought and speech. No further exam today.  LABS: none today Recent:  Lab Results  Component Value Date   CHOL 255* 06/03/2013   HDL 68.50 06/03/2013   LDLCALC 169* 06/03/2013   LDLDIRECT 128.0 12/02/2012   TRIG 86.0 06/03/2013   CHOLHDL 4 06/03/2013     Chemistry      Component Value Date/Time   NA 135 06/03/2013 1009   K 4.2 06/03/2013 1009   CL 97 06/03/2013 1009   CO2 28 06/03/2013 1009   BUN 14 06/03/2013 1009   CREATININE 0.8 06/03/2013 1009   CREATININE 0.78 06/06/2010 1745      Component Value Date/Time   CALCIUM 9.4 06/03/2013 1009   ALKPHOS 54 05/07/2012 1044   AST 15 12/02/2012 0837   ALT 20 12/02/2012 0837   BILITOT 0.6 05/07/2012 1044      IMPRESSION AND PLAN:  1) HTN; The current medical regimen is effective;  continue present plan and  medications.  2) Hyperlipidemia;The current medical regimen is effective;  continue present plan and medications. FLP today. CMET today.  3) CAD; asymptomatic.  Continue current meds, exercise.  4) Right knee osteoarthritis pain; refer to PT.  Continue prn tylenol.  5) Prev health: prevnar 13 IM today.  An After Visit Summary was printed and given to the patient.  FOLLOW UP: 4 mo

## 2014-02-10 NOTE — Progress Notes (Signed)
Pre visit review using our clinic review tool, if applicable. No additional management support is needed unless otherwise documented below in the visit note. 

## 2014-02-17 ENCOUNTER — Encounter: Payer: Self-pay | Admitting: Family Medicine

## 2014-04-21 ENCOUNTER — Other Ambulatory Visit: Payer: Self-pay | Admitting: Family Medicine

## 2014-05-24 ENCOUNTER — Telehealth: Payer: Self-pay

## 2014-05-24 NOTE — Telephone Encounter (Signed)
Please Advise Refill Request? Refill request for- Xanex 0.25mg  tab Last filled by MD on - 05/27/2013 Last Appt - 02/10/14         Next Appt - none scheduled

## 2014-05-25 ENCOUNTER — Other Ambulatory Visit: Payer: Self-pay | Admitting: Nurse Practitioner

## 2014-05-25 DIAGNOSIS — F419 Anxiety disorder, unspecified: Secondary | ICD-10-CM

## 2014-05-25 MED ORDER — ALPRAZOLAM 0.25 MG PO TABS
0.2500 mg | ORAL_TABLET | Freq: Every day | ORAL | Status: DC | PRN
Start: 2014-05-25 — End: 2015-09-05

## 2014-06-01 ENCOUNTER — Ambulatory Visit (INDEPENDENT_AMBULATORY_CARE_PROVIDER_SITE_OTHER): Payer: Medicare Other | Admitting: Family Medicine

## 2014-06-01 ENCOUNTER — Encounter: Payer: Self-pay | Admitting: Family Medicine

## 2014-06-01 VITALS — BP 140/90 | HR 66 | Temp 99.2°F | Resp 18 | Ht 61.0 in | Wt 141.0 lb

## 2014-06-01 DIAGNOSIS — E7800 Pure hypercholesterolemia, unspecified: Secondary | ICD-10-CM

## 2014-06-01 DIAGNOSIS — M1711 Unilateral primary osteoarthritis, right knee: Secondary | ICD-10-CM

## 2014-06-01 DIAGNOSIS — I10 Essential (primary) hypertension: Secondary | ICD-10-CM

## 2014-06-01 NOTE — Progress Notes (Signed)
Pre visit review using our clinic review tool, if applicable. No additional management support is needed unless otherwise documented below in the visit note. 

## 2014-06-01 NOTE — Progress Notes (Signed)
OFFICE VISIT  06/01/2014   CC:  Chief Complaint  Patient presents with  . Follow-up    fasting     HPI:    Patient is a 81 y.o. Caucasian female who presents for 3 mo f/u right knee osteoarthritis, last visit I referred her for PT for this.  She has responded moderately well but for only brief period to a steroid injection in right knee in the past. 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   She still feels a sensitive area anteriorly and medial to patella, extends down to proximal tibia.  She still does home exercises.  She takes only occ tylenol for pain. No knee buckling or giving way.  She has exercise classes 3 times a week.  Also has HTN, compliant with meds, home bp monitoring she shows me today : all normal, HR 60s and 70s.  Also she is on zetia for hyperlipidemia, she is statin intolerant.  Past Medical History  Diagnosis Date  . Hypertension   . Anxiety   . Vertigo   . Coronary artery disease   . Hyperlipidemia   . BCC (basal cell carcinoma), face 11/06/2010  . SCC (squamous cell carcinoma), arm 11/06/2010  . Bilateral bunions 11/06/2010  . Peripheral neuropathy 11/06/2010    Small fiber, non-diabetic per neurologist  . Constipation 11/06/2010  . Osteopenia 11/23/2010  . History of hyperkalemia 01/23/2011  . Allergic state 04/30/2011  . Seborrheic keratosis 04/30/2011  . Lesion of labia 02/04/2012  . GERD (gastroesophageal reflux disease) 05/11/2012  . Osteoarthritis of both knees     R>L    Past Surgical History  Procedure Laterality Date  . Cardiac catheterization  12/20/2009    3 bare metal stents  . Tubal ligation    . Eye surgery      cataract- left eye 09-24-11  . Skin biopsy  08/2012    Shave excision of lesion on pt's back: irritated seb keratosis.    Outpatient Prescriptions Prior to Visit  Medication Sig Dispense Refill  . ALPRAZolam (XANAX) 0.25 MG tablet Take 1 tablet (0.25 mg total) by mouth daily as needed. Panic attack 30  tablet 0  . amLODipine (NORVASC) 2.5 MG tablet Take 1 tablet (2.5 mg total) by mouth daily. 90 tablet 3  . aspirin 325 MG tablet Take 162.5 mg by mouth daily.     Marland Kitchen azelastine (OPTIVAR) 0.05 % ophthalmic solution Place 2 drops into both eyes 2 (two) times daily. 6 mL 12  . Benfotiamine 150 MG CAPS Take 1 capsule by mouth daily. For numbness in feet    . Cyanocobalamin (VITAMIN B-12 CR PO) Take by mouth.    . DiphenhydrAMINE HCl (BENADRYL ALLERGY PO) Take by mouth as needed.    . enalapril (VASOTEC) 20 MG tablet Take 1T po in am and 1/2 tab po qhs. 135 tablet 3  . ezetimibe (ZETIA) 10 MG tablet Take 1 tablet (10 mg total) by mouth daily. 90 tablet 3  . fluticasone (FLONASE) 50 MCG/ACT nasal spray Place 2 sprays into the nose daily. 16 g 6  . Ketotifen Fumarate (ALLERGY EYE DROPS OP) Place 2 drops into both eyes as needed. Red itchy eyes     . levocetirizine (XYZAL) 5 MG tablet TAKE ONE TABLET BY MOUTH ONCE DAILY IN THE EVENING 30 tablet 0  . magnesium oxide (MAG-OX) 400 MG tablet Take 400 mg by mouth once a week.     . metoprolol succinate (TOPROL-XL) 25 MG 24  hr tablet Takes 1 tab daily 90 tablet 3  . Multiple Vitamin (MULTIVITAMIN) tablet Take 1 tablet by mouth daily. Takes occ    . NAFTIN 1 % cream     . Omega-3 Fatty Acids (FISH OIL PO) Take 1 capsule by mouth daily.     . sodium chloride (OCEAN) 0.65 % nasal spray Place 2 sprays into the nose as needed.    . Sodium Chloride-Sodium Bicarb (RA SINUS [place] NETI POT NA) Place into the nose daily. sinuses    . benzonatate (TESSALON) 200 MG capsule Take 1 capsule (200 mg total) by mouth 2 (two) times daily as needed for cough. (Patient not taking: Reported on 02/10/2014) 20 capsule 0   No facility-administered medications prior to visit.    Allergies  Allergen Reactions  . Chlorthalidone     hyponatremia  . Atenolol     Caused fatigue   . Besivance [Besifloxacin Hcl]     rash  . Codeine Nausea And Vomiting  . Crestor [Rosuvastatin]      "leg gave away"  . Gabapentin     Memory loss and confusion  . Lipitor [Atorvastatin] Other (See Comments)    Severe leg pain  . Lovastatin     myalgia  . Simvastatin     Her leg gave away on her and states thinks secondary to Simvastatin  . Vigamox [Moxifloxacin]     rash  . Welchol [Colesevelam Hcl]     ROS As per HPI  PE: Blood pressure 140/90, pulse 66, temperature 99.2 F (37.3 C), temperature source Temporal, resp. rate 18, height 5\' 1"  (1.549 m), weight 141 lb (63.957 kg), SpO2 98 %. Gen: Alert, well appearing.  Patient is oriented to person, place, time, and situation. Both knees appear to have significant bony hypertrophy, knees appear symmetric.  Right knee without erythema but it is mildly warm to touch.  She has full ROM of knee w/out instability. She has mild soft tissue tenderness in soft tissue region medial to the patellar tendon but not quite in the area of pes anserine bursa.  No joint line tenderness.  LABS:  Lab Results  Component Value Date   CHOL 217* 02/10/2014   HDL 66.60 02/10/2014   LDLCALC 136* 02/10/2014   LDLDIRECT 128.0 12/02/2012   TRIG 73.0 02/10/2014   CHOLHDL 3 02/10/2014     Chemistry      Component Value Date/Time   NA 130* 02/10/2014 0855   K 4.4 02/10/2014 0855   CL 96 02/10/2014 0855   CO2 25 02/10/2014 0855   BUN 17 02/10/2014 0855   CREATININE 0.8 02/10/2014 0855   CREATININE 0.78 06/06/2010 1745      Component Value Date/Time   CALCIUM 9.3 02/10/2014 0855   ALKPHOS 50 02/10/2014 0855   AST 15 02/10/2014 0855   ALT 15 02/10/2014 0855   BILITOT 0.8 02/10/2014 0855       IMPRESSION AND PLAN:  1) Right knee osteoarthritis, with great improvement with PT. Currently with some soft tissue tenderness around knee cap/patella medially but I don't think this represents pathology of any significance.  Recommended she keep up the home exercises and the exercises classes she is doing 3 times per week.  Continue tylenol prn.  2)  HTN; The current medical regimen is effective;  continue present plan and medications.  3) Hyperlipidemia; statin intolerant. Her lipid profile improved on zetia: most recently 01/2014. Plan to recheck FLP and CMET at f/u in 4 mo.  An After  Visit Summary was printed and given to the patient.  FOLLOW UP: Return in about 4 months (around 10/01/2014) for routine chronic illness f/u (fasting).

## 2014-06-08 ENCOUNTER — Ambulatory Visit: Payer: Medicare Other | Admitting: Family Medicine

## 2014-07-05 ENCOUNTER — Other Ambulatory Visit: Payer: Self-pay

## 2014-07-05 DIAGNOSIS — I259 Chronic ischemic heart disease, unspecified: Secondary | ICD-10-CM

## 2014-07-05 MED ORDER — ENALAPRIL MALEATE 20 MG PO TABS
ORAL_TABLET | ORAL | Status: DC
Start: 2014-07-05 — End: 2015-07-10

## 2014-07-05 NOTE — Telephone Encounter (Signed)
Please Advise Refill Request? Refill request for- Enalapril 20mg  tab Last filled by MD on - 07/06/13 Last Appt - 06/01/14        Next Appt - 09/28/14 [facility]acy- [facility]

## 2014-09-28 ENCOUNTER — Ambulatory Visit (INDEPENDENT_AMBULATORY_CARE_PROVIDER_SITE_OTHER): Payer: Medicare Other | Admitting: Family Medicine

## 2014-09-28 ENCOUNTER — Encounter: Payer: Self-pay | Admitting: Family Medicine

## 2014-09-28 VITALS — BP 165/89 | HR 65 | Temp 98.2°F | Resp 16 | Ht 61.0 in | Wt 138.0 lb

## 2014-09-28 DIAGNOSIS — G629 Polyneuropathy, unspecified: Secondary | ICD-10-CM

## 2014-09-28 DIAGNOSIS — I10 Essential (primary) hypertension: Secondary | ICD-10-CM

## 2014-09-28 DIAGNOSIS — E785 Hyperlipidemia, unspecified: Secondary | ICD-10-CM | POA: Diagnosis not present

## 2014-09-28 DIAGNOSIS — Z79899 Other long term (current) drug therapy: Secondary | ICD-10-CM | POA: Diagnosis not present

## 2014-09-28 DIAGNOSIS — I259 Chronic ischemic heart disease, unspecified: Secondary | ICD-10-CM

## 2014-09-28 LAB — LIPID PANEL
CHOL/HDL RATIO: 3
Cholesterol: 221 mg/dL — ABNORMAL HIGH (ref 0–200)
HDL: 72.7 mg/dL (ref >39.00)
LDL CALC: 133 mg/dL — AB (ref 0–99)
NONHDL: 148.3
TRIGLYCERIDES: 78 mg/dL (ref 0.0–149.0)
VLDL: 15.6 mg/dL (ref 0.0–40.0)

## 2014-09-28 LAB — COMPREHENSIVE METABOLIC PANEL
ALT: 13 U/L (ref 0–35)
AST: 11 U/L (ref 0–37)
Albumin: 4.4 g/dL (ref 3.5–5.2)
Alkaline Phosphatase: 51 U/L (ref 39–117)
BILIRUBIN TOTAL: 0.6 mg/dL (ref 0.2–1.2)
BUN: 13 mg/dL (ref 6–23)
CO2: 25 mEq/L (ref 19–32)
CREATININE: 0.74 mg/dL (ref 0.40–1.20)
Calcium: 9.6 mg/dL (ref 8.4–10.5)
Chloride: 100 mEq/L (ref 96–112)
GFR: 78.38 mL/min (ref >60.00)
Glucose, Bld: 91 mg/dL (ref 70–99)
Potassium: 4.6 mEq/L (ref 3.5–5.1)
Sodium: 135 mEq/L (ref 135–145)
Total Protein: 7.2 g/dL (ref 6.0–8.3)

## 2014-09-28 NOTE — Progress Notes (Signed)
Pre visit review using our clinic review tool, if applicable. No additional management support is needed unless otherwise documented below in the visit note. 

## 2014-09-28 NOTE — Progress Notes (Signed)
OFFICE NOTE  09/28/2014  CC:  Chief Complaint  Patient presents with  . Follow-up    Pt is fasting.    HPI: Patient is a 81 y.o. Caucasian female who is here for 4 mo f/u HTN, hyperlipidemia, polypharmacy. She has been pairing away some meds lately b/c of not feeling very well, nonspecifi malaise. Stopped norvasc 2 wks ago, says her overall sense of well being is much improved since being off this med. Also stopped zetia b/c of R leg pain from back of thigh down to lower leg (+cost)--she hasn't seen much improvement in this since stopping zetia.  Taking tylenol bid last 2 days before today. Has changed her ASA to 81mg  qd.  More on R leg pain: also with tingling and numbness feeling down back of leg.  She also speaks about her bilat LL neuropathy sx's in same context so she may be manifesting some neuropathy sx's but higher up in the leg.  Says she has hx of L spine MRI showing only one disc protrusion and it was not compressing anything. Denies hx of sciatica.  Avg home bp is still <140/90, HR 60s.  Pertinent PMH:  Past medical, surgical, social, and family history reviewed and no changes are noted since last office visit.  MEDS:  Outpatient Prescriptions Prior to Visit  Medication Sig Dispense Refill  . ALPRAZolam (XANAX) 0.25 MG tablet Take 1 tablet (0.25 mg total) by mouth daily as needed. Panic attack 30 tablet 0  . aspirin 325 MG tablet Take 162.5 mg by mouth daily.     Marland Kitchen azelastine (OPTIVAR) 0.05 % ophthalmic solution Place 2 drops into both eyes 2 (two) times daily. 6 mL 12  . Cyanocobalamin (VITAMIN B-12 CR PO) Take by mouth.    . enalapril (VASOTEC) 20 MG tablet Take 1T po in am and 1/2 tab po qhs. 135 tablet 3  . fluticasone (FLONASE) 50 MCG/ACT nasal spray Place 2 sprays into the nose daily. 16 g 6  . Ketotifen Fumarate (ALLERGY EYE DROPS OP) Place 2 drops into both eyes as needed. Red itchy eyes     . magnesium oxide (MAG-OX) 400 MG tablet Take 400 mg by mouth once a  week.     . metoprolol succinate (TOPROL-XL) 25 MG 24 hr tablet Takes 1 tab daily 90 tablet 3  . Multiple Vitamin (MULTIVITAMIN) tablet Take 1 tablet by mouth daily. Takes occ    . Omega-3 Fatty Acids (FISH OIL PO) Take 1 capsule by mouth daily.     Marland Kitchen amLODipine (NORVASC) 2.5 MG tablet Take 1 tablet (2.5 mg total) by mouth daily. (Patient not taking: Reported on 09/28/2014) 90 tablet 3  . Benfotiamine 150 MG CAPS Take 1 capsule by mouth daily. For numbness in feet    . DiphenhydrAMINE HCl (BENADRYL ALLERGY PO) Take by mouth as needed.    . ezetimibe (ZETIA) 10 MG tablet Take 1 tablet (10 mg total) by mouth daily. (Patient not taking: Reported on 09/28/2014) 90 tablet 3  . levocetirizine (XYZAL) 5 MG tablet TAKE ONE TABLET BY MOUTH ONCE DAILY IN THE EVENING (Patient not taking: Reported on 09/28/2014) 30 tablet 0  . NAFTIN 1 % cream     . sodium chloride (OCEAN) 0.65 % nasal spray Place 2 sprays into the nose as needed.    . Sodium Chloride-Sodium Bicarb (RA SINUS [place] NETI POT NA) Place into the nose daily. sinuses     No facility-administered medications prior to visit.    PE: Blood  pressure 165/89, pulse 65, temperature 98.2 F (36.8 C), temperature source Oral, resp. rate 16, height 5\' 1"  (1.549 m), weight 138 lb (62.596 kg), SpO2 93 %. Gen: Alert, well appearing.  Patient is oriented to person, place, time, and situation. AFFECT: pleasant, lucid thought and speech. No low back tenderness.  No glut/ischial tuberosity tenderness. Sitting SLR negative on R.   LABS:  Lab Results  Component Value Date   TSH 1.36 05/07/2012   Lab Results  Component Value Date   WBC 4.7 06/03/2013   HGB 14.3 06/03/2013   HCT 42.7 06/03/2013   MCV 95.0 06/03/2013   PLT 169.0 06/03/2013   Lab Results  Component Value Date   CREATININE 0.8 02/10/2014   BUN 17 02/10/2014   NA 130* 02/10/2014   K 4.4 02/10/2014   CL 96 02/10/2014   CO2 25 02/10/2014   Lab Results  Component Value Date   ALT 15  02/10/2014   AST 15 02/10/2014   ALKPHOS 50 02/10/2014   BILITOT 0.8 02/10/2014   Lab Results  Component Value Date   CHOL 217* 02/10/2014   Lab Results  Component Value Date   HDL 66.60 02/10/2014   Lab Results  Component Value Date   LDLCALC 136* 02/10/2014   Lab Results  Component Value Date   TRIG 73.0 02/10/2014   Lab Results  Component Value Date   CHOLHDL 3 02/10/2014   IMPRESSION AND PLAN:  1) HTN; The current medical regimen is effective;  continue present plan and medications. Will take norvasc off her med list. CMET today.  2) Hyperlipidemia: fasting lipid panel today.  The only med left that she is willing to try is metamucil. Zetia removed from med list.  3) Peripheral neuropathy.  Bilat LL sx's, giving her more problems at night and moving a bit further up LL's. Also, I think possibly her R sciatica-type pain in R leg could be related. She is not open to taking any meds for this unless it is a "cure".  An After Visit Summary was printed and given to the patient.  FOLLOW UP: 4 mo

## 2014-09-29 ENCOUNTER — Encounter: Payer: Self-pay | Admitting: *Deleted

## 2014-09-29 ENCOUNTER — Encounter: Payer: Self-pay | Admitting: Family Medicine

## 2014-09-30 ENCOUNTER — Telehealth: Payer: Self-pay | Admitting: Cardiology

## 2014-09-30 NOTE — Telephone Encounter (Signed)
New message     Pt c/o medication issue:  1. Name of Medication: norvasc 2. How are you currently taking this medication (dosage and times per day)? 2.5mg  3. Are you having a reaction (difficulty breathing--STAT)?no  4. What is your medication issue? Pt stopped taking her norvasc last fri because it was making her tired and zombie-like.  Her bp today was 137/74 and pulse 63.  She still is not back to normal but does feel a little better.  Will it be ok to stay off rx?

## 2014-09-30 NOTE — Telephone Encounter (Signed)
Spoke with patient and she stated she had not had any Norvasc since Friday and only taken her blood pressure Tuesday and today Tuesday's blood pressure systolic was in the 224'M and today 137/74 Discussed with  [name] and ok to continue off but needs to check blood pressure daily, restart Norvasc if goes back up Advised patient and she will call back if blood pressure starts to rise Patient verbalized understanding

## 2014-10-17 ENCOUNTER — Other Ambulatory Visit: Payer: Self-pay | Admitting: Family Medicine

## 2014-10-18 NOTE — Telephone Encounter (Signed)
RF request for azelastine LOV: 09/28/14 Next ov:  02/01/15 Last written: 06/03/13 64ml w/ 12RF Please advise. Thanks.

## 2014-11-28 ENCOUNTER — Other Ambulatory Visit: Payer: Self-pay | Admitting: Cardiology

## 2014-12-12 ENCOUNTER — Other Ambulatory Visit: Payer: Self-pay | Admitting: Cardiology

## 2014-12-13 NOTE — Telephone Encounter (Signed)
Jennifer Peck at 09/30/2014 6:31 PM     Status: Signed       Expand All Collapse All   Spoke with patient and she stated she had not had any Norvasc since Friday and only taken her blood pressure Tuesday and today Tuesday's blood pressure systolic was in the 588'F and today 137/74 Discussed with [name] and ok to continue off but needs to check blood pressure daily, restart Norvasc if goes back up Advised patient and she will call back if blood pressure starts to rise Patient verbalized understanding          I didn't see a phone call from patient about any recent blood pressures or about re starting Norvasc.

## 2014-12-15 ENCOUNTER — Other Ambulatory Visit: Payer: Self-pay | Admitting: *Deleted

## 2014-12-15 ENCOUNTER — Telehealth: Payer: Self-pay | Admitting: *Deleted

## 2014-12-15 ENCOUNTER — Other Ambulatory Visit: Payer: Self-pay | Admitting: Cardiology

## 2014-12-15 MED ORDER — AMLODIPINE BESYLATE 2.5 MG PO TABS: 2.5000 mg | ORAL_TABLET | Freq: Every day | ORAL | Status: DC

## 2014-12-15 NOTE — Telephone Encounter (Signed)
Okay to refill her amlodipine.

## 2014-12-15 NOTE — Telephone Encounter (Signed)
Patient called and stated that she needs a refill on amlodipine 2.5mg . The refill request has been refused twice due to phone call from 09/30/14. Patient stated that Dr Mare Ferrari did not want her off of the medication and she has been taking it. She adjusted her allergy medication and the tiredness and zombie-like feelings subsided. Please advise as she is out. Thanks, MI

## 2015-01-18 DIAGNOSIS — M5416 Radiculopathy, lumbar region: Secondary | ICD-10-CM

## 2015-01-18 HISTORY — DX: Radiculopathy, lumbar region: M54.16

## 2015-01-21 ENCOUNTER — Ambulatory Visit: Payer: BC Managed Care – PPO | Admitting: Cardiology

## 2015-02-01 ENCOUNTER — Ambulatory Visit (INDEPENDENT_AMBULATORY_CARE_PROVIDER_SITE_OTHER): Payer: Medicare Other | Admitting: Family Medicine

## 2015-02-01 ENCOUNTER — Encounter: Payer: Self-pay | Admitting: Family Medicine

## 2015-02-01 VITALS — BP 137/83 | HR 61 | Temp 98.2°F | Resp 16 | Ht 61.0 in | Wt 137.0 lb

## 2015-02-01 DIAGNOSIS — M5416 Radiculopathy, lumbar region: Secondary | ICD-10-CM | POA: Diagnosis not present

## 2015-02-01 DIAGNOSIS — I10 Essential (primary) hypertension: Secondary | ICD-10-CM | POA: Diagnosis not present

## 2015-02-01 DIAGNOSIS — I259 Chronic ischemic heart disease, unspecified: Secondary | ICD-10-CM

## 2015-02-01 DIAGNOSIS — Z23 Encounter for immunization: Secondary | ICD-10-CM

## 2015-02-01 DIAGNOSIS — E785 Hyperlipidemia, unspecified: Secondary | ICD-10-CM | POA: Diagnosis not present

## 2015-02-01 LAB — LIPID PANEL
Cholesterol: 222 mg/dL — ABNORMAL HIGH (ref 0–200)
HDL: 72.5 mg/dL (ref >39.00)
LDL Cholesterol: 138 mg/dL — ABNORMAL HIGH (ref 0–99)
NONHDL: 149.03
TRIGLYCERIDES: 57 mg/dL (ref 0.0–149.0)
Total CHOL/HDL Ratio: 3
VLDL: 11.4 mg/dL (ref 0.0–40.0)

## 2015-02-01 NOTE — Progress Notes (Signed)
OFFICE VISIT  02/01/2015   CC:  Chief Complaint  Patient presents with  . Follow-up    Pt is fasting.    HPI:    Patient is a 81 y.o. Caucasian female who presents for 4 mo f/u HTN, hyperlipidemia, peripheral neuropathic pain.  HTN: got back on amlodipine 2.5, bp before was 140-150 syst, and since getting back on it has had avg systolic [id number] and avg diast 60s.  HR 60s-70.    Hyperlipidemia: she is taking metamucil one big spoonful once a day.  She declines other cholest meds due to side effects.  Zetia was just too expensive.  Still has some intermittent pulling type pain still --in R leg from glut down the side all the way to foot.  Gets worse while driving.   She has some chronic LBP.   Also says the R leg feels weak easily when doing exercises.  Has done PT for this in the past and it responded some but then PT ended.  She is open to trying PT again for this.  She does not want a med for this.   Past Medical History  Diagnosis Date  . Hypertension   . Anxiety   . Vertigo   . Coronary artery disease   . Hyperlipidemia     Intolerant of statins and zetia.  Started metamucil trial 09/2014  . BCC (basal cell carcinoma), face 11/06/2010  . SCC (squamous cell carcinoma), arm 11/06/2010  . Bilateral bunions 11/06/2010  . Peripheral neuropathy ([place]) 11/06/2010    Small fiber, non-diabetic per neurologist  . Constipation 11/06/2010  . Osteopenia 11/23/2010  . History of hyperkalemia 01/23/2011  . Allergic state 04/30/2011  . Seborrheic keratosis 04/30/2011  . Lesion of labia 02/04/2012  . GERD (gastroesophageal reflux disease) 05/11/2012  . Osteoarthritis of both knees     R>L    Past Surgical History  Procedure Laterality Date  . Cardiac catheterization  12/20/2009    3 bare metal stents  . Tubal ligation    . Eye surgery      cataract- left eye 09-24-11  . Skin biopsy  08/2012    Shave excision of lesion on pt's back: irritated seb keratosis.    Outpatient Prescriptions Prior to  Visit  Medication Sig Dispense Refill  . ALPRAZolam (XANAX) 0.25 MG tablet Take 1 tablet (0.25 mg total) by mouth daily as needed. Panic attack 30 tablet 0  . amLODipine (NORVASC) 2.5 MG tablet Take 1 tablet (2.5 mg total) by mouth daily. 180 tablet 0  . aspirin 81 MG tablet Take 81 mg by mouth daily.    Marland Kitchen azelastine (OPTIVAR) 0.05 % ophthalmic solution INSTILL TWO DROPS INTO EACH EYE TWICE DAILY 6 mL 12  . Benfotiamine 150 MG CAPS Take 1 capsule by mouth daily. For numbness in feet    . Cyanocobalamin (VITAMIN B-12 CR PO) Take by mouth.    . DiphenhydrAMINE HCl (BENADRYL ALLERGY PO) Take by mouth as needed.    . enalapril (VASOTEC) 20 MG tablet Take 1T po in am and 1/2 tab po qhs. 135 tablet 3  . fluticasone (FLONASE) 50 MCG/ACT nasal spray Place 2 sprays into the nose daily. 16 g 6  . Ketotifen Fumarate (ALLERGY EYE DROPS OP) Place 2 drops into both eyes as needed. Red itchy eyes     . magnesium oxide (MAG-OX) 400 MG tablet Take 400 mg by mouth once a week.     . metoprolol succinate (TOPROL-XL) 25 MG 24 hr tablet  TAKE ONE TABLET BY MOUTH ONCE DAILY 90 tablet 1  . Multiple Vitamin (MULTIVITAMIN) tablet Take 1 tablet by mouth daily. Takes occ    . NAFTIN 1 % cream     . Omega-3 Fatty Acids (FISH OIL PO) Take 1 capsule by mouth daily.      No facility-administered medications prior to visit.    Allergies  Allergen Reactions  . Chlorthalidone     hyponatremia  . Atenolol     Caused fatigue   . Besivance [Besifloxacin Hcl]     rash  . Codeine Nausea And Vomiting  . Crestor [Rosuvastatin]     "leg gave away"  . Gabapentin     Memory loss and confusion  . Lipitor [Atorvastatin] Other (See Comments)    Severe leg pain  . Lovastatin     myalgia  . Simvastatin     Her leg gave away on her and states thinks secondary to Simvastatin  . Vigamox [Moxifloxacin]     rash  . Welchol [Colesevelam Hcl]     ROS As per HPI  PE: Blood pressure 137/83, pulse 61, temperature 98.2 F (36.8  C), temperature source Oral, resp. rate 16, height 5\' 1"  (1.549 m), weight 137 lb (62.143 kg), SpO2 99 %. Gen: Alert, well appearing.  Patient is oriented to person, place, time, and situation. Sitting SLR neg bilat. R leg prox strength 4/5, distal 4/5 as well. L leg strength 5/5/ prox and dist.  Can't elicit DTRs in knees or ankles of either leg. Trace pitting edema in both LL's.  LABS:  Lab Results  Component Value Date   TSH 1.36 05/07/2012   Lab Results  Component Value Date   WBC 4.7 06/03/2013   HGB 14.3 06/03/2013   HCT 42.7 06/03/2013   MCV 95.0 06/03/2013   PLT 169.0 06/03/2013   Lab Results  Component Value Date   CREATININE 0.74 09/28/2014   BUN 13 09/28/2014   NA 135 09/28/2014   K 4.6 09/28/2014   CL 100 09/28/2014   CO2 25 09/28/2014   Lab Results  Component Value Date   ALT 13 09/28/2014   AST 11 09/28/2014   ALKPHOS 51 09/28/2014   BILITOT 0.6 09/28/2014   Lab Results  Component Value Date   CHOL 221* 09/28/2014   Lab Results  Component Value Date   HDL 72.70 09/28/2014   Lab Results  Component Value Date   LDLCALC 133* 09/28/2014   Lab Results  Component Value Date   TRIG 78.0 09/28/2014   Lab Results  Component Value Date   CHOLHDL 3 09/28/2014    IMPRESSION AND PLAN:  1) HTN; The current medical regimen is effective;  continue present plan and medications. No labs needed today.  2) Hyperlipidemia: on metamucil.  She is open to adding zetia when it becomes generic next year if price is right. She is fasting today and wants to check lipids to see if metamucil has helped.  3) R Lumbar radiculopathy: sensory and motor sx's. It is impossible to tell how much her peripheral neuropathy is playing a role in these sx's.  Will get her back into PT.  An After Visit Summary was printed and given to the patient.  FOLLOW UP: Return in about 4 months (around 06/01/2015) for routine chronic illness f/u.

## 2015-02-01 NOTE — Progress Notes (Signed)
Pre visit review using our clinic review tool, if applicable. No additional management support is needed unless otherwise documented below in the visit note. 

## 2015-02-28 ENCOUNTER — Encounter: Payer: Self-pay | Admitting: Cardiology

## 2015-02-28 ENCOUNTER — Ambulatory Visit (INDEPENDENT_AMBULATORY_CARE_PROVIDER_SITE_OTHER): Payer: Medicare Other | Admitting: Cardiology

## 2015-02-28 VITALS — BP 150/90 | HR 72 | Ht 62.0 in | Wt 139.2 lb

## 2015-02-28 DIAGNOSIS — E785 Hyperlipidemia, unspecified: Secondary | ICD-10-CM

## 2015-02-28 DIAGNOSIS — I119 Hypertensive heart disease without heart failure: Secondary | ICD-10-CM

## 2015-02-28 DIAGNOSIS — I259 Chronic ischemic heart disease, unspecified: Secondary | ICD-10-CM

## 2015-02-28 MED ORDER — METOPROLOL SUCCINATE ER 25 MG PO TB24: 25.0000 mg | ORAL_TABLET | Freq: Every day | ORAL | Status: DC

## 2015-02-28 NOTE — Patient Instructions (Signed)
Medication Instructions:  Your physician recommends that you continue on your current medications as directed. Please refer to the Current Medication list given to you today.  Labwork: none  Testing/Procedures: none  Follow-Up: Your physician wants you to follow-up in: with Dr Pat Kocher will receive a reminder letter in the mail two months in advance. If you don't receive a letter, please call our office to schedule the follow-up appointment.  If you need a refill on your cardiac medications before your next appointment, please call your pharmacy.

## 2015-02-28 NOTE — Progress Notes (Signed)
Cardiology Office Note   Date:  02/28/2015   ID:  Jennifer Peck, DOB 1922-12-09, MRN [id number]  PCP:  Tammi Sou, MD  Cardiologist: Darlin Coco MD  Chief Complaint  Patient presents with  . Hypertension      History of Present Illness: Jennifer Peck is a 80 y.o. female who presents for scheduled 1 year follow-up visit  This pleasant 81 year old woman is seen for a scheduled followup office visit. She has a history of essential hypertension and a history of ischemic heart disease. She had 3 bare-metal stents placed on 12/20/09 into her mid left circumflex coronary artery, the second obtuse marginal vessel, and the first obtuse marginal vessel and into the mid right coronary artery. He has done well since then with no recurrent angina. Has occasional indigestion for which she takes Pepcid. She has not had to take any sublingual nitroglycerin. She continues to exercise regularly at the recreation center in [place] sneakers program. She has a history of hypercholesterolemia.. She has a history of hypercholesterolemia but is statin intolerant. Since last visit she has not had any recurrent chest pain.  No dizziness or syncope.  She has mild exertional dyspnea but probably normal for age 52.  Past Medical History  Diagnosis Date  . Hypertension   . Anxiety   . Vertigo   . Coronary artery disease   . Hyperlipidemia     Intolerant of statins and zetia.  Started metamucil trial 09/2014  . BCC (basal cell carcinoma), face 11/06/2010  . SCC (squamous cell carcinoma), arm 11/06/2010  . Bilateral bunions 11/06/2010  . Peripheral neuropathy ([place]) 11/06/2010    Small fiber, non-diabetic per neurologist  . Constipation 11/06/2010  . Osteopenia 11/23/2010  . History of hyperkalemia 01/23/2011  . Allergic state 04/30/2011  . Seborrheic keratosis 04/30/2011  . Lesion of labia 02/04/2012  . GERD (gastroesophageal reflux disease) 05/11/2012  . Osteoarthritis of both knees     R>L      Past Surgical History  Procedure Laterality Date  . Cardiac catheterization  12/20/2009    3 bare metal stents  . Tubal ligation    . Eye surgery      cataract- left eye 09-24-11  . Skin biopsy  08/2012    Shave excision of lesion on pt's back: irritated seb keratosis.     Current Outpatient Prescriptions  Medication Sig Dispense Refill  . ALPRAZolam (XANAX) 0.25 MG tablet Take 1 tablet (0.25 mg total) by mouth daily as needed. Panic attack 30 tablet 0  . amLODipine (NORVASC) 2.5 MG tablet Take 1 tablet (2.5 mg total) by mouth daily. 180 tablet 0  . aspirin 81 MG tablet Take 81 mg by mouth daily.    Marland Kitchen azelastine (OPTIVAR) 0.05 % ophthalmic solution INSTILL TWO DROPS INTO EACH EYE TWICE DAILY 6 mL 12  . Benfotiamine 150 MG CAPS Take 1 capsule by mouth daily. For numbness in feet    . Cyanocobalamin (VITAMIN B-12 CR PO) Take 1 tablet by mouth daily.     . DiphenhydrAMINE HCl (BENADRYL ALLERGY PO) Take 1 tablet by mouth as needed (allergies).     . enalapril (VASOTEC) 20 MG tablet Take 20 mg by mouth every morning. Take 10 mg (1/2 tablet) by mouth daily at bedtime.    . fluticasone (FLONASE) 50 MCG/ACT nasal spray Place 2 sprays into the nose daily. 16 g 6  . Ketotifen Fumarate (ALLERGY EYE DROPS OP) Place 2 drops into both eyes as needed. Red  itchy eyes     . magnesium oxide (MAG-OX) 400 MG tablet Take 400 mg by mouth once a week.     . metoprolol succinate (TOPROL-XL) 25 MG 24 hr tablet Take 1 tablet (25 mg total) by mouth daily. 90 tablet 3  . Multiple Vitamin (MULTIVITAMIN) tablet Take 1 tablet by mouth daily. Takes occ    . NAFTIN 1 % cream Apply 1 application topically daily as needed (skin).     . Omega-3 Fatty Acids (FISH OIL PO) Take 1 capsule by mouth daily.      No current facility-administered medications for this visit.    Allergies:   Chlorthalidone; Atenolol; Besivance; Codeine; Crestor; Gabapentin; Lipitor; Lovastatin; Simvastatin; Vigamox; and Welchol    Social  History:  The patient  reports that she quit smoking about 37 years ago. She has never used smokeless tobacco. She reports that she drinks alcohol. She reports that she does not use illicit drugs.   Family History:  The patient's family history includes Asthma in her paternal grandfather; Cancer in her daughter; Heart disease in her father; Hyperlipidemia in her daughter and son; Hypertension in her daughter, daughter, son, son, and son; Stroke in her mother and paternal grandmother; Vision loss in her maternal grandfather.    ROS:  Please see the history of present illness.   Otherwise, review of systems are positive for none.   All other systems are reviewed and negative.    PHYSICAL EXAM: VS:  BP 150/90 mmHg  Pulse 72  Ht 5\' 2"  (1.575 m)  Wt 139 lb 3.2 oz (63.141 kg)  BMI 25.45 kg/m2 , BMI Body mass index is 25.45 kg/(m^2). GEN: Well nourished, well developed, in no acute distress HEENT: normal Neck: no JVD, carotid bruits, or masses Cardiac: RRR; no murmurs, rubs, or gallops,no edema  Respiratory:  clear to auscultation bilaterally, normal work of breathing GI: soft, nontender, nondistended, + BS MS: no deformity or atrophy Skin: warm and dry, no rash Neuro:  Strength and sensation are intact Psych: euthymic mood, full affect   EKG:  EKG is ordered today. The ekg ordered today demonstrates normal sinus rhythm at 72 bpm with sinus arrhythmia.  Otherwise normal EKG.   Recent Labs: 09/28/2014: ALT 13; BUN 13; Creatinine, Ser 0.74; Potassium 4.6; Sodium 135    Lipid Panel    Component Value Date/Time   CHOL 222* 02/01/2015 0952   TRIG 57.0 02/01/2015 0952   HDL 72.50 02/01/2015 0952   CHOLHDL 3 02/01/2015 0952   VLDL 11.4 02/01/2015 0952   LDLCALC 138* 02/01/2015 0952   LDLDIRECT 128.0 12/02/2012 0837      Wt Readings from Last 3 Encounters:  02/28/15 139 lb 3.2 oz (63.141 kg)  02/01/15 137 lb (62.143 kg)  09/28/14 138 lb (62.596 kg)         ASSESSMENT AND  PLAN:  1. ischemic heart disease status post 3 bare-metal stents placed on 12/20/09 into her mid left circumflex, second obtuse marginal, and first obtuse marginal vessels and into the right coronary artery. 2. Hypercholesterolemia 3. essential hypertension without heart failure  Disposition: Continue same medication. Continue amlodipine 2.5 mg one daily. Follow up with PCP Dr. Anitra Lauth. Recheck here in one year for office visit and with Dr. Oval Linsey or Dr. Jacinta Shoe   Current medicines are reviewed at length with the patient today.  The patient does not have concerns regarding medicines.  The following changes have been made:  no change  Labs/ tests ordered today include:  Orders Placed This Encounter  Procedures  . EKG 12-Lead     Signed, Darlin Coco MD 02/28/2015 6:45 PM    [facility] [place], [place], [place]  [address] Phone: [phone]; Fax: [phone]

## 2015-03-25 ENCOUNTER — Encounter: Payer: Self-pay | Admitting: Family Medicine

## 2015-05-02 ENCOUNTER — Emergency Department (HOSPITAL_COMMUNITY)
Admission: EM | Admit: 2015-05-02 | Discharge: 2015-05-03 | Disposition: A | Discharge status: Home / Self Care | Payer: Medicare Other | Attending: Emergency Medicine | Admitting: Emergency Medicine

## 2015-05-02 ENCOUNTER — Emergency Department (HOSPITAL_COMMUNITY): Payer: Medicare Other

## 2015-05-02 ENCOUNTER — Encounter (HOSPITAL_COMMUNITY): Payer: Self-pay | Admitting: Emergency Medicine

## 2015-05-02 DIAGNOSIS — I2510 Atherosclerotic heart disease of native coronary artery without angina pectoris: Secondary | ICD-10-CM | POA: Insufficient documentation

## 2015-05-02 DIAGNOSIS — Z87891 Personal history of nicotine dependence: Secondary | ICD-10-CM | POA: Diagnosis not present

## 2015-05-02 DIAGNOSIS — Z7951 Long term (current) use of inhaled steroids: Secondary | ICD-10-CM | POA: Diagnosis not present

## 2015-05-02 DIAGNOSIS — E785 Hyperlipidemia, unspecified: Secondary | ICD-10-CM | POA: Diagnosis not present

## 2015-05-02 DIAGNOSIS — Z7982 Long term (current) use of aspirin: Secondary | ICD-10-CM | POA: Diagnosis not present

## 2015-05-02 DIAGNOSIS — R410 Disorientation, unspecified: Secondary | ICD-10-CM | POA: Insufficient documentation

## 2015-05-02 DIAGNOSIS — K219 Gastro-esophageal reflux disease without esophagitis: Secondary | ICD-10-CM | POA: Diagnosis not present

## 2015-05-02 DIAGNOSIS — R002 Palpitations: Secondary | ICD-10-CM | POA: Diagnosis present

## 2015-05-02 DIAGNOSIS — I10 Essential (primary) hypertension: Secondary | ICD-10-CM | POA: Diagnosis not present

## 2015-05-02 DIAGNOSIS — Z79899 Other long term (current) drug therapy: Secondary | ICD-10-CM | POA: Diagnosis not present

## 2015-05-02 DIAGNOSIS — J159 Unspecified bacterial pneumonia: Secondary | ICD-10-CM | POA: Diagnosis not present

## 2015-05-02 DIAGNOSIS — F419 Anxiety disorder, unspecified: Secondary | ICD-10-CM | POA: Diagnosis not present

## 2015-05-02 DIAGNOSIS — J189 Pneumonia, unspecified organism: Secondary | ICD-10-CM

## 2015-05-02 NOTE — ED Notes (Signed)
Patient transported to X-ray 

## 2015-05-02 NOTE — ED Provider Notes (Signed)
CSN: [id number]     Arrival date & time 05/02/15  2310 History  By signing my name below, I, Rowan Blase, attest that this documentation has been prepared under the direction and in the presence of Orpah Greek, MD . Electronically Signed: Rowan Blase, Scribe. 05/02/2015. 12:40 AM.   Chief Complaint  Patient presents with  . Anxiety   The history is provided by the patient and the EMS personnel. No language interpreter was used.   HPI Comments:  Jennifer Peck is a 82 y.o. female with a PMHx of HTN, CAD, HLD, GERD, and anxiety brought in by EMS who presents to the Emergency Department complaining of sudden onset, "heart racing" palpitations while she was falling asleep earlier this evening. She notes this feeling lasted ~1 hr. Pt reports associated mild shortness of breath and EMS reports she felt confused. No alleviating factors noted. Pt reports experiencing similar palpitations in the past but has no current treatment or diagnosis. Pt denies chest pain.  Past Medical History  Diagnosis Date  . Hypertension   . Anxiety   . Vertigo   . Coronary artery disease   . Hyperlipidemia     Intolerant of statins and zetia.  Started metamucil trial 09/2014  . BCC (basal cell carcinoma), face 11/06/2010  . SCC (squamous cell carcinoma), arm 11/06/2010  . Bilateral bunions 11/06/2010  . Peripheral neuropathy ([place]) 11/06/2010    Small fiber, non-diabetic per neurologist  . Constipation 11/06/2010  . Osteopenia 11/23/2010  . History of hyperkalemia 01/23/2011  . Allergic state 04/30/2011  . Seborrheic keratosis 04/30/2011  . Lesion of labia 02/04/2012  . GERD (gastroesophageal reflux disease) 05/11/2012  . Osteoarthritis of both knees     R>L  . Right lumbar radiculopathy 01/2015    Improved with PT   Past Surgical History  Procedure Laterality Date  . Cardiac catheterization  12/20/2009    3 bare metal stents  . Tubal ligation    . Eye surgery      cataract- left eye 09-24-11  .  Skin biopsy  08/2012    Shave excision of lesion on pt's back: irritated seb keratosis.   Family History  Problem Relation Age of Onset  . Stroke Mother   . Heart disease Father   . Hypertension Daughter   . Hyperlipidemia Daughter   . Hypertension Son   . Cancer Daughter     rectal/ chemo and radiation  . Hypertension Daughter   . Hypertension Son   . Hyperlipidemia Son   . Hypertension Son   . Vision loss Maternal Grandfather   . Stroke Paternal Grandmother   . Asthma Paternal Grandfather    Social History  Substance Use Topics  . Smoking status: Former Smoker    Quit date: 09/04/1977  . Smokeless tobacco: Never Used  . Alcohol Use: Yes     Comment: only on Holidays   OB History    No data available     Review of Systems  Respiratory: Positive for shortness of breath.   Cardiovascular: Positive for palpitations. Negative for chest pain.  Psychiatric/Behavioral: Positive for confusion.  All other systems reviewed and are negative.  Allergies  Chlorthalidone; Atenolol; Besivance; Codeine; Crestor; Gabapentin; Lipitor; Lovastatin; Simvastatin; Vigamox; and Welchol  Home Medications   Prior to Admission medications   Medication Sig Start Date End Date Taking? Authorizing Provider  ALPRAZolam (XANAX) 0.25 MG tablet Take 1 tablet (0.25 mg total) by mouth daily as needed. Panic attack 05/25/14  Yes Layne  Read Drivers, NP  amLODipine (NORVASC) 2.5 MG tablet Take 1 tablet (2.5 mg total) by mouth daily. 12/15/14  Yes Darlin Coco, MD  aspirin 81 MG tablet Take 81 mg by mouth daily.   Yes Historical Provider, MD  azelastine (OPTIVAR) 0.05 % ophthalmic solution INSTILL TWO DROPS INTO EACH EYE TWICE DAILY 10/18/14  Yes Tammi Sou, MD  Benfotiamine 150 MG CAPS Take 1 capsule by mouth daily. For numbness in feet   Yes Historical Provider, MD  Cyanocobalamin (VITAMIN B-12 CR PO) Take 1 tablet by mouth daily.    Yes Historical Provider, MD  DiphenhydrAMINE HCl (BENADRYL ALLERGY  PO) Take 1 tablet by mouth as needed (allergies).    Yes Historical Provider, MD  enalapril (VASOTEC) 20 MG tablet Take 10-20 mg by mouth 2 (two) times daily. Take 20 mg every morning then Take 10 mg (1/2 tablet) by mouth daily at bedtime.   Yes Historical Provider, MD  fluticasone (FLONASE) 50 MCG/ACT nasal spray Place 2 sprays into the nose daily. 12/21/11  Yes Mosie Lukes, MD  Ketotifen Fumarate (ALLERGY EYE DROPS OP) Place 2 drops into both eyes as needed. Red itchy eyes    Yes Historical Provider, MD  magnesium oxide (MAG-OX) 400 MG tablet Take 400 mg by mouth once a week.    Yes Historical Provider, MD  metoprolol succinate (TOPROL-XL) 25 MG 24 hr tablet Take 1 tablet (25 mg total) by mouth daily. 02/28/15  Yes Darlin Coco, MD  Multiple Vitamin (MULTIVITAMIN) tablet Take 1 tablet by mouth daily. Takes occ   Yes Historical Provider, MD  Omega-3 Fatty Acids (FISH OIL PO) Take 1 capsule by mouth daily.    Yes Historical Provider, MD  doxycycline (VIBRAMYCIN) 100 MG capsule Take 1 capsule (100 mg total) by mouth 2 (two) times daily. 05/03/15   Orpah Greek, MD   BP 153/87 mmHg  Pulse 66  Temp(Src) 98.1 F (36.7 C) (Oral)  Resp 17  SpO2 96% Physical Exam  Constitutional: She is oriented to person, place, and time. She appears well-developed and well-nourished. No distress.  HENT:  Head: Normocephalic and atraumatic.  Right Ear: Hearing normal.  Left Ear: Hearing normal.  Nose: Nose normal.  Mouth/Throat: Oropharynx is clear and moist and mucous membranes are normal.  Eyes: Conjunctivae and EOM are normal. Pupils are equal, round, and reactive to light.  Neck: Normal range of motion. Neck supple.  Cardiovascular: Regular rhythm, S1 normal and S2 normal.  Exam reveals no gallop and no friction rub.   No murmur heard. Pulmonary/Chest: Effort normal and breath sounds normal. No respiratory distress. She exhibits no tenderness.  Abdominal: Soft. Normal appearance and bowel  sounds are normal. There is no hepatosplenomegaly. There is no tenderness. There is no rebound, no guarding, no tenderness at McBurney's point and negative Murphy's sign. No hernia.  Musculoskeletal: Normal range of motion.  Neurological: She is alert and oriented to person, place, and time. She has normal strength. No cranial nerve deficit or sensory deficit. Coordination normal. GCS eye subscore is 4. GCS verbal subscore is 5. GCS motor subscore is 6.  Skin: Skin is warm, dry and intact. No rash noted. No cyanosis.  Psychiatric: She has a normal mood and affect. Her speech is normal and behavior is normal. Thought content normal.  Nursing note and vitals reviewed.   ED Course  Procedures  DIAGNOSTIC STUDIES:  Oxygen Saturation is 99% on RA, normal by my interpretation.    COORDINATION OF CARE:  11:08  PM Will order head CT, chest x-ray, blood work, and UA. Discussed treatment plan with pt at bedside and pt agreed to plan.  Labs Review Labs Reviewed  COMPREHENSIVE METABOLIC PANEL - Abnormal; Notable for the following:    Glucose, Bld 114 (*)    All other components within normal limits  BRAIN NATRIURETIC PEPTIDE - Abnormal; Notable for the following:    B Natriuretic Peptide 154.6 (*)    All other components within normal limits  CBC WITH DIFFERENTIAL/PLATELET  TROPONIN I  URINALYSIS, ROUTINE W REFLEX MICROSCOPIC (NOT AT [facility])    Imaging Review Dg Chest 2 View  05/02/2015  CLINICAL DATA:  Palpitations and shortness of breath tonight. History of hypertension and coronary artery disease. EXAM: CHEST  2 VIEW COMPARISON:  09/02/2009 FINDINGS: Cardiac enlargement without vascular congestion. Suggestion of slight infiltration in the right lung base, mostly interstitial. This may indicate early pneumonia. Left lung is clear. No blunting of costophrenic angles. No pneumothorax. Calcified and tortuous aorta. Degenerative changes in the spine. IMPRESSION: Cardiac enlargement. Suggestion of  early infiltration in the right lung base. Electronically Signed   By: Lucienne Capers M.D.   On: 05/02/2015 23:41   Ct Head Wo Contrast  05/03/2015  CLINICAL DATA:  Confusion. Feels like her heart is racing. History of anxiety. EXAM: CT HEAD WITHOUT CONTRAST TECHNIQUE: Contiguous axial images were obtained from the base of the skull through the vertex without intravenous contrast. COMPARISON:  09/02/2009 FINDINGS: Diffuse cerebral atrophy. Ventricular dilatation consistent with central atrophy. Low-attenuation changes in the deep white matter consistent with small vessel ischemia. No mass effect or midline shift. No abnormal extra-axial fluid collections. Gray-white matter junctions are distinct. Basal cisterns are not effaced. No evidence of acute intracranial hemorrhage. No depressed skull fractures. Opacification of some of the mastoid air cells bilaterally. Paranasal sinuses are clear. Vascular calcifications. IMPRESSION: No acute intracranial abnormalities. Chronic atrophy and small vessel ischemic changes. Bilateral mastoid effusions. Electronically Signed   By: Lucienne Capers M.D.   On: 05/03/2015 00:34   I have personally reviewed and evaluated these images and lab results as part of my medical decision-making.   EKG Interpretation   Date/Time:  Tuesday May 03 2015 00:26:42 EST Ventricular Rate:  63 PR Interval:  175 QRS Duration: 89 QT Interval:  434 QTC Calculation: 444 R Axis:   47 Text Interpretation:  Sinus rhythm Ventricular premature complex RSR' in  V1 or V2, probably normal variant Baseline wander in lead(s) II III aVF  Otherwise within normal limits Confirmed by Tashan Kreitzer  MD, [place]  [phone]) on 05/03/2015 12:42:22 AM     MDM   Final diagnoses:  Palpitations  CAP (community acquired pneumonia)    Patient presents to the emergency department for evaluation of racing heartbeat. Patient reports that symptoms began earlier this evening. She has not been  experiencing any chest pain. Patient reports she is feeling very anxious. She does report that she felt like she might of been confused earlier, but she is alert and oriented at arrival. Her examination is unremarkable. Cardiac evaluation is normal.  CXR shows possible early pneumonia. She does report that she has had "allergy symptoms" and cough. Will treat empirically. Follow-up with primary doctor for recheck this week.  I personally performed the services described in this documentation, which was scribed in my presence. The recorded information has been reviewed and is accurate.    Orpah Greek, MD 05/03/15 [phone]

## 2015-05-02 NOTE — ED Notes (Signed)
Pt in EMS reporting a feeling of her "heart racing", pt has hx anxiety. Has prescription for xanax but its expired. Per EMS pt stated she felt "confused" Pt completely A/OX4

## 2015-05-03 ENCOUNTER — Emergency Department (HOSPITAL_COMMUNITY): Payer: Medicare Other

## 2015-05-03 DIAGNOSIS — J159 Unspecified bacterial pneumonia: Secondary | ICD-10-CM | POA: Diagnosis not present

## 2015-05-03 LAB — URINALYSIS, ROUTINE W REFLEX MICROSCOPIC
BILIRUBIN URINE: NEGATIVE
Glucose, UA: NEGATIVE mg/dL
Hgb urine dipstick: NEGATIVE
KETONES UR: NEGATIVE mg/dL
LEUKOCYTES UA: NEGATIVE
NITRITE: NEGATIVE
PH: 7 (ref 5.0–8.0)
PROTEIN: NEGATIVE mg/dL
Specific Gravity, Urine: 1.005 (ref 1.005–1.030)

## 2015-05-03 LAB — COMPREHENSIVE METABOLIC PANEL
ALBUMIN: 4.1 g/dL (ref 3.5–5.0)
ALT: 22 U/L (ref 14–54)
ANION GAP: 11 (ref 5–15)
AST: 21 U/L (ref 15–41)
Alkaline Phosphatase: 54 U/L (ref 38–126)
BILIRUBIN TOTAL: 0.4 mg/dL (ref 0.3–1.2)
BUN: 20 mg/dL (ref 6–20)
CHLORIDE: 101 mmol/L (ref 101–111)
CO2: 25 mmol/L (ref 22–32)
Calcium: 9.5 mg/dL (ref 8.9–10.3)
Creatinine, Ser: 0.77 mg/dL (ref 0.44–1.00)
GFR calc Af Amer: >60 mL/min (ref >60)
GLUCOSE: 114 mg/dL — AB (ref 65–99)
POTASSIUM: 4.5 mmol/L (ref 3.5–5.1)
Sodium: 137 mmol/L (ref 135–145)
TOTAL PROTEIN: 6.9 g/dL (ref 6.5–8.1)

## 2015-05-03 LAB — CBC WITH DIFFERENTIAL/PLATELET
BASOS ABS: 0 10*3/uL (ref 0.0–0.1)
BASOS PCT: 0 %
EOS PCT: 2 %
Eosinophils Absolute: 0.1 10*3/uL (ref 0.0–0.7)
HCT: 42.1 % (ref 36.0–46.0)
Hemoglobin: 14.2 g/dL (ref 12.0–15.0)
Lymphocytes Relative: 33 %
Lymphs Abs: 1.6 10*3/uL (ref 0.7–4.0)
MCH: 31.8 pg (ref 26.0–34.0)
MCHC: 33.7 g/dL (ref 30.0–36.0)
MCV: 94.4 fL (ref 78.0–100.0)
MONO ABS: 0.5 10*3/uL (ref 0.1–1.0)
MONOS PCT: 9 %
Neutro Abs: 2.8 10*3/uL (ref 1.7–7.7)
Neutrophils Relative %: 56 %
PLATELETS: 172 10*3/uL (ref 150–400)
RBC: 4.46 MIL/uL (ref 3.87–5.11)
RDW: 13.5 % (ref 11.5–15.5)
WBC: 5 10*3/uL (ref 4.0–10.5)

## 2015-05-03 LAB — BRAIN NATRIURETIC PEPTIDE: B Natriuretic Peptide: 154.6 pg/mL — ABNORMAL HIGH (ref 0.0–100.0)

## 2015-05-03 LAB — TROPONIN I

## 2015-05-03 MED ORDER — DOXYCYCLINE HYCLATE 100 MG PO CAPS: 100.0000 mg | ORAL_CAPSULE | Freq: Two times a day (BID) | ORAL | Status: DC

## 2015-05-03 MED ORDER — DOXYCYCLINE HYCLATE 100 MG PO TABS: 100.0000 mg | ORAL_TABLET | Freq: Once | ORAL | Status: DC

## 2015-05-03 NOTE — Discharge Instructions (Signed)
Community-Acquired Pneumonia, Adult Pneumonia is an infection of the lungs. There are different types of pneumonia. One type can develop while a person is in a hospital. A different type, called community-acquired pneumonia, develops in people who are not, or have not recently been, in the hospital or other health care facility.  CAUSES Pneumonia may be caused by bacteria, viruses, or funguses. Community-acquired pneumonia is often caused by Streptococcus pneumonia bacteria. These bacteria are often passed from one person to another by breathing in droplets from the cough or sneeze of an infected person. RISK FACTORS The condition is more likely to develop in:  People who havechronic diseases, such as chronic obstructive pulmonary disease (COPD), asthma, congestive heart failure, cystic fibrosis, diabetes, or kidney disease.  People who haveearly-stage or late-stage HIV.  People who havesickle cell disease.  People who havehad their spleen removed (splenectomy).  People who havepoor Human resources officer.  People who havemedical conditions that increase the risk of breathing in (aspirating) secretions their own mouth and nose.   People who havea weakened immune system (immunocompromised).  People who smoke.  People whotravel to areas where pneumonia-causing germs commonly exist.  People whoare around animal habitats or animals that have pneumonia-causing germs, including birds, bats, rabbits, cats, and farm animals. SYMPTOMS Symptoms of this condition include:  Adry cough.  A wet (productive) cough.  Fever.  Sweating.  Chest pain, especially when breathing deeply or coughing.  Rapid breathing or difficulty breathing.  Shortness of breath.  Shaking chills.  Fatigue.  Muscle aches. DIAGNOSIS Your health care provider will take a medical history and perform a physical exam. You may also have other tests, including:  Imaging studies of your chest, including  X-rays.  Tests to check your blood oxygen level and other blood gases.  Other tests on blood, mucus (sputum), fluid around your lungs (pleural fluid), and urine. If your pneumonia is severe, other tests may be done to identify the specific cause of your illness. TREATMENT The type of treatment that you receive depends on many factors, such as the cause of your pneumonia, the medicines you take, and other medical conditions that you have. For most adults, treatment and recovery from pneumonia may occur at home. In some cases, treatment must happen in a hospital. Treatment may include:  Antibiotic medicines, if the pneumonia was caused by bacteria.  Antiviral medicines, if the pneumonia was caused by a virus.  Medicines that are given by mouth or through an IV tube.  Oxygen.  Respiratory therapy. Although rare, treating severe pneumonia may include:  Mechanical ventilation. This is done if you are not breathing well on your own and you cannot maintain a safe blood oxygen level.  Thoracentesis. This procedureremoves fluid around one lung or both lungs to help you breathe better. HOME CARE INSTRUCTIONS  Take over-the-counter and prescription medicines only as told by your health care provider.  Only takecough medicine if you are losing sleep. Understand that cough medicine can prevent your body's natural ability to remove mucus from your lungs.  If you were prescribed an antibiotic medicine, take it as told by your health care provider. Do not stop taking the antibiotic even if you start to feel better.  Sleep in a semi-upright position at night. Try sleeping in a reclining chair, or place a few pillows under your head.  Do not use tobacco products, including cigarettes, chewing tobacco, and e-cigarettes. If you need help quitting, ask your health care provider.  Drink enough water to keep your urine  clear or pale yellow. This will help to thin out mucus secretions in your  lungs. PREVENTION There are ways that you can decrease your risk of developing community-acquired pneumonia. Consider getting a pneumococcal vaccine if:  You are older than 80 years of age.  You are older than 80 years of age and are undergoing cancer treatment, have chronic lung disease, or have other medical conditions that affect your immune system. Ask your health care provider if this applies to you. There are different types and schedules of pneumococcal vaccines. Ask your health care provider which vaccination option is best for you. You may also prevent community-acquired pneumonia if you take these actions:  Get an influenza vaccine every year. Ask your health care provider which type of influenza vaccine is best for you.  Go to the dentist on a regular basis.  Wash your hands often. Use hand sanitizer if soap and water are not available. SEEK MEDICAL CARE IF:  You have a fever.  You are losing sleep because you cannot control your cough with cough medicine. SEEK IMMEDIATE MEDICAL CARE IF:  You have worsening shortness of breath.  You have increased chest pain.  Your sickness becomes worse, especially if you are an older adult or have a weakened immune system.  You cough up blood.   This information is not intended to replace advice given to you by your health care provider. Make sure you discuss any questions you have with your health care provider.   Document Released: 03/05/2005 Document Revised: 11/24/2014 Document Reviewed: 06/30/2014 Elsevier Interactive Patient Education 2016 [employer].  Palpitations A palpitation is the feeling that your heartbeat is irregular or is faster than normal. It may feel like your heart is fluttering or skipping a beat. Palpitations are usually not a serious problem. However, in some cases, you may need further medical evaluation. CAUSES  Palpitations can be caused by:  Smoking.  Caffeine or other stimulants, such as diet pills  or energy drinks.  Alcohol.  Stress and anxiety.  Strenuous physical activity.  Fatigue.  Certain medicines.  Heart disease, especially if you have a history of irregular heart rhythms (arrhythmias), such as atrial fibrillation, atrial flutter, or supraventricular tachycardia.  An improperly working pacemaker or defibrillator. DIAGNOSIS  To find the cause of your palpitations, your health care provider will take your medical history and perform a physical exam. Your health care provider may also have you take a test called an ambulatory electrocardiogram (ECG). An ECG records your heartbeat patterns over a 24-hour period. You may also have other tests, such as:  Transthoracic echocardiogram (TTE). During echocardiography, sound waves are used to evaluate how blood flows through your heart.  Transesophageal echocardiogram (TEE).  Cardiac monitoring. This allows your health care provider to monitor your heart rate and rhythm in real time.  Holter monitor. This is a portable device that records your heartbeat and can help diagnose heart arrhythmias. It allows your health care provider to track your heart activity for several days, if needed.  Stress tests by exercise or by giving medicine that makes the heart beat faster. TREATMENT  Treatment of palpitations depends on the cause of your symptoms and can vary greatly. Most cases of palpitations do not require any treatment other than time, relaxation, and monitoring your symptoms. Other causes, such as atrial fibrillation, atrial flutter, or supraventricular tachycardia, usually require further treatment. HOME CARE INSTRUCTIONS   Avoid:  Caffeinated coffee, tea, soft drinks, diet pills, and energy drinks.  Chocolate.  Alcohol.  Stop smoking if you smoke.  Reduce your stress and anxiety. Things that can help you relax include:  A method of controlling things in your body, such as your heartbeats, with your mind  (biofeedback).  Yoga.  Meditation.  Physical activity such as swimming, jogging, or walking.  Get plenty of rest and sleep. SEEK MEDICAL CARE IF:   You continue to have a fast or irregular heartbeat beyond 24 hours.  Your palpitations occur more often. SEEK IMMEDIATE MEDICAL CARE IF:  You have chest pain or shortness of breath.  You have a severe headache.  You feel dizzy or you faint. MAKE SURE YOU:  Understand these instructions.  Will watch your condition.  Will get help right away if you are not doing well or get worse.   This information is not intended to replace advice given to you by your health care provider. Make sure you discuss any questions you have with your health care provider.   Document Released: 03/02/2000 Document Revised: 03/10/2013 Document Reviewed: 05/04/2011 Elsevier Interactive Patient Education [employer].

## 2015-05-09 ENCOUNTER — Ambulatory Visit (INDEPENDENT_AMBULATORY_CARE_PROVIDER_SITE_OTHER): Payer: Medicare Other | Admitting: Family Medicine

## 2015-05-09 ENCOUNTER — Encounter: Payer: Self-pay | Admitting: Family Medicine

## 2015-05-09 VITALS — BP 130/82 | HR 66 | Temp 98.8°F | Resp 16 | Ht 62.0 in | Wt 141.2 lb

## 2015-05-09 DIAGNOSIS — R002 Palpitations: Secondary | ICD-10-CM | POA: Diagnosis not present

## 2015-05-09 DIAGNOSIS — J189 Pneumonia, unspecified organism: Secondary | ICD-10-CM | POA: Diagnosis not present

## 2015-05-09 DIAGNOSIS — Z1283 Encounter for screening for malignant neoplasm of skin: Secondary | ICD-10-CM

## 2015-05-09 NOTE — Progress Notes (Signed)
Pre visit review using our clinic review tool, if applicable. No additional management support is needed unless otherwise documented below in the visit note. 

## 2015-05-09 NOTE — Progress Notes (Signed)
OFFICE VISIT  05/09/2015   CC:  Chief Complaint  Patient presents with  . Follow-up    ER visit   HPI:    Patient is a 82 y.o. Caucasian female who presents for ER f/u: was seen 05/03/15 (6 days ago) in ER for palpitations.  Some question of feeling confused at some point in time. ER w/u: head CT, CXR, blood work, and EKG. CXR showed cardiac enlargement and suggestion of early infiltration in the right lung. Head CT showed no acute intracranial abnormalities.  Chronic atrophy and small vessel ischemic changes.  Bilateral mastoid effusions. EKG: sinus rhythm, w/ventricular premature complex RSR' in V1 or V2, probable normal variant.   See blood work in labs section below. She was d/c'd home on doxycycline for tx of pneumonia.  Looking back, she says she recalls feeling bad in general +subjective f/c a day or two prior to onset of her palpitations/ED visit.  Since then, she says she has had no recurrence of heart palpitations or racing heart.  Says she has chronic ACE-I cough that is unchanged.  No more fevers. She initially felt quite a bit better after the ED visit.  Then today she feels very tired and took her doxy on an empty stomach and this made her feel bad. She goes on to say that the doxy does make her feel better.   Past Medical History  Diagnosis Date  . Hypertension   . Anxiety   . Vertigo   . Coronary artery disease   . Hyperlipidemia     Intolerant of statins and zetia.  Started metamucil trial 09/2014  . BCC (basal cell carcinoma), face 11/06/2010  . SCC (squamous cell carcinoma), arm 11/06/2010  . Bilateral bunions 11/06/2010  . Peripheral neuropathy ([place]) 11/06/2010    Small fiber, non-diabetic per neurologist  . Constipation 11/06/2010  . Osteopenia 11/23/2010  . History of hyperkalemia 01/23/2011  . Allergic state 04/30/2011  . Seborrheic keratosis 04/30/2011  . Lesion of labia 02/04/2012  . GERD (gastroesophageal reflux disease) 05/11/2012  . Osteoarthritis of both  knees     R>L  . Right lumbar radiculopathy 01/2015    Improved with PT    Past Surgical History  Procedure Laterality Date  . Cardiac catheterization  12/20/2009    3 bare metal stents  . Tubal ligation    . Eye surgery      cataract- left eye 09-24-11  . Skin biopsy  08/2012    Shave excision of lesion on pt's back: irritated seb keratosis.    Outpatient Prescriptions Prior to Visit  Medication Sig Dispense Refill  . ALPRAZolam (XANAX) 0.25 MG tablet Take 1 tablet (0.25 mg total) by mouth daily as needed. Panic attack 30 tablet 0  . amLODipine (NORVASC) 2.5 MG tablet Take 1 tablet (2.5 mg total) by mouth daily. 180 tablet 0  . aspirin 81 MG tablet Take 81 mg by mouth daily.    Marland Kitchen azelastine (OPTIVAR) 0.05 % ophthalmic solution INSTILL TWO DROPS INTO EACH EYE TWICE DAILY 6 mL 12  . Benfotiamine 150 MG CAPS Take 1 capsule by mouth daily. For numbness in feet    . Cyanocobalamin (VITAMIN B-12 CR PO) Take 1 tablet by mouth daily.     . DiphenhydrAMINE HCl (BENADRYL ALLERGY PO) Take 1 tablet by mouth as needed (allergies).     Marland Kitchen doxycycline (VIBRAMYCIN) 100 MG capsule Take 1 capsule (100 mg total) by mouth 2 (two) times daily. 20 capsule 0  . enalapril (VASOTEC)  20 MG tablet Take 10-20 mg by mouth 2 (two) times daily. Take 20 mg every morning then Take 10 mg (1/2 tablet) by mouth daily at bedtime.    . fluticasone (FLONASE) 50 MCG/ACT nasal spray Place 2 sprays into the nose daily. 16 g 6  . Ketotifen Fumarate (ALLERGY EYE DROPS OP) Place 2 drops into both eyes as needed. Red itchy eyes     . magnesium oxide (MAG-OX) 400 MG tablet Take 400 mg by mouth once a week.     . metoprolol succinate (TOPROL-XL) 25 MG 24 hr tablet Take 1 tablet (25 mg total) by mouth daily. 90 tablet 3  . Multiple Vitamin (MULTIVITAMIN) tablet Take 1 tablet by mouth daily. Takes occ    . Omega-3 Fatty Acids (FISH OIL PO) Take 1 capsule by mouth daily.      No facility-administered medications prior to visit.     Allergies  Allergen Reactions  . Chlorthalidone     hyponatremia  . Atenolol     Caused fatigue   . Besivance [Besifloxacin Hcl]     rash  . Codeine Nausea And Vomiting  . Crestor [Rosuvastatin]     "leg gave away"  . Gabapentin     Memory loss and confusion  . Lipitor [Atorvastatin] Other (See Comments)    Severe leg pain  . Lovastatin     myalgia  . Simvastatin     Her leg gave away on her and states thinks secondary to Simvastatin  . Vigamox [Moxifloxacin]     rash  . Welchol [Colesevelam Hcl]     ROS As per HPI  PE: Blood pressure 130/82, pulse 66, temperature 98.8 F (37.1 C), temperature source Oral, resp. rate 16, height 5\' 2"  (1.575 m), weight 141 lb 4 oz (64.071 kg), SpO2 94 %. Gen: Alert, well appearing.  Patient is oriented to person, place, time, and situation. AFFECT: pleasant, lucid thought and speech. ENT: Ears: EACs clear, normal epithelium.  TMs with good light reflex and landmarks bilaterally.  Eyes: no injection, icteris, swelling, or exudate.  EOMI, PERRLA. Nose: no drainage or turbinate edema/swelling.  No injection or focal lesion.  Mouth: lips without lesion/swelling.  Oral mucosa pink and moist.  Dentition intact and without obvious caries or gingival swelling.  Oropharynx without erythema, exudate, or swelling.  Neck - No masses or thyromegaly or limitation in range of motion CV: RRR, no m/r/g.   LUNGS: CTA bilat, nonlabored resps, good aeration in all lung fields. EXT: no clubbing, cyanosis, or edema.   LABS:  Lab Results  Component Value Date   WBC 5.0 05/02/2015   HGB 14.2 05/02/2015   HCT 42.1 05/02/2015   MCV 94.4 05/02/2015   PLT 172 05/02/2015     Chemistry      Component Value Date/Time   NA 137 05/02/2015 2340   K 4.5 05/02/2015 2340   CL 101 05/02/2015 2340   CO2 25 05/02/2015 2340   BUN 20 05/02/2015 2340   CREATININE 0.77 05/02/2015 2340   CREATININE 0.78 06/06/2010 1745      Component Value Date/Time   CALCIUM 9.5  05/02/2015 2340   ALKPHOS 54 05/02/2015 2340   AST 21 05/02/2015 2340   ALT 22 05/02/2015 2340   BILITOT 0.4 05/02/2015 2340     BNP 05/02/15: was 154.6  IMPRESSION AND PLAN:  1) CAP; improving.  Finish doxy. Will recheck CXR to document radiologic resolution in 4-5 weeks.  2) Palpitations: this was a symptom of her  pneumonia: no further problems with this.  3) Skin exam: pt requested referral to dermatologist today so she could get a complete skin exam.  Referral ordered.  An After Visit Summary was printed and given to the patient.  Spent 25 min with pt today, with >50% of this time spent in counseling and care coordination regarding the above problems.  FOLLOW UP: Return for keep appt already scheduled for March 2017.

## 2015-05-19 ENCOUNTER — Telehealth: Payer: Self-pay | Admitting: Cardiology

## 2015-05-19 NOTE — Telephone Encounter (Signed)
Spoke with patient and gave her Dr Blenda Mounts name for follow up as per last office note Patient will be coming to [facility] over another location

## 2015-05-19 NOTE — Telephone Encounter (Signed)
She wants to know who was the doctor you recommended for her to see after Dr Mare Ferrari leaves?

## 2015-05-30 ENCOUNTER — Encounter: Payer: Self-pay | Admitting: Family Medicine

## 2015-05-30 ENCOUNTER — Other Ambulatory Visit: Payer: Self-pay | Admitting: Family Medicine

## 2015-05-30 ENCOUNTER — Ambulatory Visit (INDEPENDENT_AMBULATORY_CARE_PROVIDER_SITE_OTHER): Payer: Medicare Other | Admitting: Family Medicine

## 2015-05-30 VITALS — BP 133/82 | HR 73 | Temp 97.7°F | Resp 16 | Ht 62.0 in | Wt 141.5 lb

## 2015-05-30 DIAGNOSIS — Z8701 Personal history of pneumonia (recurrent): Secondary | ICD-10-CM | POA: Diagnosis not present

## 2015-05-30 DIAGNOSIS — B882 Other arthropod infestations: Secondary | ICD-10-CM | POA: Diagnosis not present

## 2015-05-30 MED ORDER — DOXYCYCLINE HYCLATE 100 MG PO TABS: 100.0000 mg | ORAL_TABLET | Freq: Two times a day (BID) | ORAL | Status: DC

## 2015-05-30 NOTE — Progress Notes (Signed)
Pre visit review using our clinic review tool, if applicable. No additional management support is needed unless otherwise documented below in the visit note. 

## 2015-05-30 NOTE — Progress Notes (Signed)
OFFICE VISIT  05/30/2015   CC:  Chief Complaint  Patient presents with  . Tick Bite    found tick yesterday  . Xray results    spot on lung     HPI:    Patient is a 82 y.o. Caucasian female who presents for tick bite. She noted a tick on left flank yesterday, said it was engorged and had a red rash around it when she pulled the tick out.  No itching or pain at the site.  Pt states she feels well: denies f/c/HA/malaise/or myalgias/arthralgias or fatigue.  Has been applying neosporin to the site of the tick bite.  She brought the tick in with her today.  Also, says she would like to do her pneumonia recheck today: this was scheduled for tomorrow originally. She took a full course of doxycycline and says "that was such a miracle drug for me" and says she has felt well for a couple of weeks now and has no cough.    Past Medical History  Diagnosis Date  . Hypertension   . Anxiety   . Vertigo   . Coronary artery disease   . Hyperlipidemia     Intolerant of statins and zetia.  Started metamucil trial 09/2014  . BCC (basal cell carcinoma), face 11/06/2010  . SCC (squamous cell carcinoma), arm 11/06/2010  . Bilateral bunions 11/06/2010  . Peripheral neuropathy ([place]) 11/06/2010    Small fiber, non-diabetic per neurologist  . Constipation 11/06/2010  . Osteopenia 11/23/2010  . History of hyperkalemia 01/23/2011  . Allergic state 04/30/2011  . Seborrheic keratosis 04/30/2011  . Lesion of labia 02/04/2012  . GERD (gastroesophageal reflux disease) 05/11/2012  . Osteoarthritis of both knees     R>L  . Right lumbar radiculopathy 01/2015    Improved with PT    Past Surgical History  Procedure Laterality Date  . Cardiac catheterization  12/20/2009    3 bare metal stents  . Tubal ligation    . Eye surgery      cataract- left eye 09-24-11  . Skin biopsy  08/2012    Shave excision of lesion on pt's back: irritated seb keratosis.    Outpatient Prescriptions Prior to Visit  Medication Sig  Dispense Refill  . ALPRAZolam (XANAX) 0.25 MG tablet Take 1 tablet (0.25 mg total) by mouth daily as needed. Panic attack 30 tablet 0  . amLODipine (NORVASC) 2.5 MG tablet Take 1 tablet (2.5 mg total) by mouth daily. 180 tablet 0  . aspirin 81 MG tablet Take 81 mg by mouth daily.    Marland Kitchen azelastine (OPTIVAR) 0.05 % ophthalmic solution INSTILL TWO DROPS INTO EACH EYE TWICE DAILY 6 mL 12  . Benfotiamine 150 MG CAPS Take 1 capsule by mouth daily. For numbness in feet    . Cyanocobalamin (VITAMIN B-12 CR PO) Take 1 tablet by mouth daily.     . DiphenhydrAMINE HCl (BENADRYL ALLERGY PO) Take 1 tablet by mouth as needed (allergies).     . enalapril (VASOTEC) 20 MG tablet Take 10-20 mg by mouth 2 (two) times daily. Take 20 mg every morning then Take 10 mg (1/2 tablet) by mouth daily at bedtime.    . fluticasone (FLONASE) 50 MCG/ACT nasal spray Place 2 sprays into the nose daily. 16 g 6  . Ketotifen Fumarate (ALLERGY EYE DROPS OP) Place 2 drops into both eyes as needed. Red itchy eyes     . magnesium oxide (MAG-OX) 400 MG tablet Take 400 mg by mouth once a  week.     . metoprolol succinate (TOPROL-XL) 25 MG 24 hr tablet Take 1 tablet (25 mg total) by mouth daily. 90 tablet 3  . Multiple Vitamin (MULTIVITAMIN) tablet Take 1 tablet by mouth daily. Takes occ    . Omega-3 Fatty Acids (FISH OIL PO) Take 1 capsule by mouth daily.     Marland Kitchen doxycycline (VIBRAMYCIN) 100 MG capsule Take 1 capsule (100 mg total) by mouth 2 (two) times daily. (Patient not taking: Reported on 05/30/2015) 20 capsule 0   No facility-administered medications prior to visit.    Allergies  Allergen Reactions  . Chlorthalidone     hyponatremia  . Atenolol     Caused fatigue   . Besivance [Besifloxacin Hcl]     rash  . Codeine Nausea And Vomiting  . Crestor [Rosuvastatin]     "leg gave away"  . Gabapentin     Memory loss and confusion  . Lipitor [Atorvastatin] Other (See Comments)    Severe leg pain  . Lovastatin     myalgia  .  Simvastatin     Her leg gave away on her and states thinks secondary to Simvastatin  . Vigamox [Moxifloxacin]     rash  . Welchol [Colesevelam Hcl]     ROS As per HPI  PE: Blood pressure 133/82, pulse 73, temperature 97.7 F (36.5 C), temperature source Oral, resp. rate 16, height 5\' 2"  (1.575 m), weight 141 lb 8 oz (64.184 kg), SpO2 93 %. Gen: Alert, well appearing.  Patient is oriented to person, place, time, and situation. CV: RRR, no m/r/g.   LUNGS: CTA bilat, nonlabored resps, good aeration in all lung fields. SKIN: L flank with oval macular rash about 5 cm in longest diameter, with slight "bulls-eye" appearance.  LABS:  The tick had the hallmarks of an adult lonestar tick.  IMPRESSION AND PLAN:  1) Tick-associated rash, without signs of illness. Unknown how long tick was on patient but she described it being engorged and very difficult to pull out. The rash has actually shrunk significantly per pt, but it does have the suggestion of erythema migrans rash. However, the type of tick she had is not a known carrier of borrelia.   After talking with the patient, I decided to treat conservatively and give her doxy 100 mg bid x 7d. Apply otc hyrdocortisone to the rash if it itches.  Stop neosporin. We'll check borrelia titers today and again at a f/u visit in 2 mo.  2) Hx of pneumonia: clinically resolved. Will check CXR for radiographic resolution--ordered today.  An After Visit Summary was printed and given to the patient.  FOLLOW UP: Return for routine chronic illness f/u+ repeat lyme labs.

## 2015-05-31 ENCOUNTER — Ambulatory Visit: Payer: Medicare Other | Admitting: Family Medicine

## 2015-05-31 LAB — LYME AB/WESTERN BLOT REFLEX: B burgdorferi Ab IgG+IgM: <0.9 Index (ref <0.90)

## 2015-06-01 ENCOUNTER — Ambulatory Visit (INDEPENDENT_AMBULATORY_CARE_PROVIDER_SITE_OTHER): Payer: Medicare Other

## 2015-06-01 DIAGNOSIS — Z8701 Personal history of pneumonia (recurrent): Secondary | ICD-10-CM

## 2015-07-10 ENCOUNTER — Other Ambulatory Visit: Payer: Self-pay | Admitting: Family Medicine

## 2015-07-11 ENCOUNTER — Other Ambulatory Visit: Payer: Self-pay

## 2015-07-11 NOTE — Telephone Encounter (Signed)
RF request for enalapril LOV: 02/01/15 Next ov: 08/02/15 Last written: 02/08/15 #135 w/ 3RF

## 2015-07-13 ENCOUNTER — Telehealth: Payer: Self-pay | Admitting: Family Medicine

## 2015-07-13 NOTE — Telephone Encounter (Signed)
Pt calling to ask if you would assume her Amlodipine 2.5mg  rx. Dr Mare Ferrari used to prescribe for her but he has retired. She uses the [facility] in [place].

## 2015-07-13 NOTE — Telephone Encounter (Signed)
There is a pending request for this medication by cardiology. Will wait to see what they do.

## 2015-07-14 MED ORDER — AMLODIPINE BESYLATE 2.5 MG PO TABS: 2.5000 mg | ORAL_TABLET | Freq: Every day | ORAL | Status: DC

## 2015-07-14 NOTE — Telephone Encounter (Signed)
Pt called back in regards to this request. I advised her that we were waiting on cardiology, she stated that the has been out of medicatoin x 4 days. I will go ahead and refill for #180 w/ 0Rf. Pt advised and voiced understanding.

## 2015-07-26 ENCOUNTER — Ambulatory Visit: Payer: Medicare Other | Admitting: Family Medicine

## 2015-08-02 ENCOUNTER — Other Ambulatory Visit: Payer: Self-pay | Admitting: Family Medicine

## 2015-08-02 ENCOUNTER — Encounter: Payer: Self-pay | Admitting: Family Medicine

## 2015-08-02 ENCOUNTER — Ambulatory Visit (INDEPENDENT_AMBULATORY_CARE_PROVIDER_SITE_OTHER): Payer: Medicare Other | Admitting: Family Medicine

## 2015-08-02 VITALS — BP 124/77 | HR 54 | Temp 98.1°F | Resp 16 | Ht 60.5 in | Wt 143.8 lb

## 2015-08-02 DIAGNOSIS — G609 Hereditary and idiopathic neuropathy, unspecified: Secondary | ICD-10-CM | POA: Diagnosis not present

## 2015-08-02 DIAGNOSIS — I10 Essential (primary) hypertension: Secondary | ICD-10-CM | POA: Diagnosis not present

## 2015-08-02 DIAGNOSIS — E785 Hyperlipidemia, unspecified: Secondary | ICD-10-CM | POA: Diagnosis not present

## 2015-08-02 DIAGNOSIS — A6920 Lyme disease, unspecified: Secondary | ICD-10-CM

## 2015-08-02 LAB — BASIC METABOLIC PANEL
BUN: 21 mg/dL (ref 6–23)
CALCIUM: 9.5 mg/dL (ref 8.4–10.5)
CO2: 27 meq/L (ref 19–32)
CREATININE: 0.71 mg/dL (ref 0.40–1.20)
Chloride: 103 mEq/L (ref 96–112)
GFR: 82.06 mL/min (ref >60.00)
GLUCOSE: 104 mg/dL — AB (ref 70–99)
Potassium: 4.9 mEq/L (ref 3.5–5.1)
Sodium: 136 mEq/L (ref 135–145)

## 2015-08-02 NOTE — Progress Notes (Signed)
OFFICE VISIT  08/02/2015   CC:  Chief Complaint  Patient presents with  . Follow-up    Pt is not fasting.     HPI:    Patient is a 82 y.o. Caucasian female who presents for 6 mo f/u HTN, HLD, peripheral neuropathic pain, and to recheck convalescent lyme antibody titers. Since I last saw her for routine f/u she had CAP and was treated and had a f/u CXR that was clear. She then had a rash suggestive of erythema migrans so I treated her with doxy and drew acute borrelia antibody titers--which were negative.  Complains of usual seasonal allergies sx's.    Started taking benfotiamine again for her PN in feet for the last 6 mo and she says it helps her with her lack of sensation in feet.  Home bp's: systolics always Q000111Q, diastolics Q000111Q.     She is intolerant of statins and zetia.  Tried metamucil for a while but didn't stick with this very long. Says diet not very good lately, blames family and holidays!   Past Medical History  Diagnosis Date  . Hypertension   . Anxiety   . Vertigo   . Coronary artery disease   . Hyperlipidemia     Intolerant of statins and zetia.  Started metamucil trial 09/2014  . BCC (basal cell carcinoma), face 11/06/2010  . SCC (squamous cell carcinoma), arm 11/06/2010  . Bilateral bunions 11/06/2010  . Peripheral neuropathy ([place]) 11/06/2010    Small fiber, non-diabetic per neurologist  . Constipation 11/06/2010  . Osteopenia 11/23/2010  . History of hyperkalemia 01/23/2011  . Allergic state 04/30/2011  . Seborrheic keratosis 04/30/2011  . Lesion of labia 02/04/2012  . GERD (gastroesophageal reflux disease) 05/11/2012  . Osteoarthritis of both knees     R>L  . Right lumbar radiculopathy 01/2015    Improved with PT    Past Surgical History  Procedure Laterality Date  . Cardiac catheterization  12/20/2009    3 bare metal stents  . Tubal ligation    . Eye surgery      cataract- left eye 09-24-11  . Skin biopsy  08/2012    Shave excision of lesion on pt's  back: irritated seb keratosis.    Outpatient Prescriptions Prior to Visit  Medication Sig Dispense Refill  . ALPRAZolam (XANAX) 0.25 MG tablet Take 1 tablet (0.25 mg total) by mouth daily as needed. Panic attack 30 tablet 0  . amLODipine (NORVASC) 2.5 MG tablet Take 1 tablet (2.5 mg total) by mouth daily. 180 tablet 0  . aspirin 81 MG tablet Take 81 mg by mouth daily.    Marland Kitchen azelastine (OPTIVAR) 0.05 % ophthalmic solution INSTILL TWO DROPS INTO EACH EYE TWICE DAILY 6 mL 12  . Benfotiamine 150 MG CAPS Take 1 capsule by mouth daily. For numbness in feet    . Cyanocobalamin (VITAMIN B-12 CR PO) Take 1 tablet by mouth daily.     . DiphenhydrAMINE HCl (BENADRYL ALLERGY PO) Take 1 tablet by mouth as needed (allergies).     . enalapril (VASOTEC) 20 MG tablet TAKE ONE TABLET BY MOUTH IN THE MORNING AND TAKE ONE-HALF TABLET AT BEDTIME 135 tablet 3  . Ketotifen Fumarate (ALLERGY EYE DROPS OP) Place 2 drops into both eyes as needed. Red itchy eyes     . magnesium oxide (MAG-OX) 400 MG tablet Take 400 mg by mouth once a week.     . metoprolol succinate (TOPROL-XL) 25 MG 24 hr tablet Take 1 tablet (25  mg total) by mouth daily. 90 tablet 3  . Multiple Vitamin (MULTIVITAMIN) tablet Take 1 tablet by mouth daily. Takes occ    . Omega-3 Fatty Acids (FISH OIL PO) Take 1 capsule by mouth daily.     Marland Kitchen doxycycline (VIBRA-TABS) 100 MG tablet Take 1 tablet (100 mg total) by mouth 2 (two) times daily. (Patient not taking: Reported on 08/02/2015) 14 tablet 0  . enalapril (VASOTEC) 20 MG tablet Take 10-20 mg by mouth 2 (two) times daily. Reported on 08/02/2015    . fluticasone (FLONASE) 50 MCG/ACT nasal spray Place 2 sprays into the nose daily. (Patient not taking: Reported on 08/02/2015) 16 g 6   No facility-administered medications prior to visit.    Allergies  Allergen Reactions  . Chlorthalidone     hyponatremia  . Atenolol     Caused fatigue   . Besivance [Besifloxacin Hcl]     rash  . Codeine Nausea And  Vomiting  . Crestor [Rosuvastatin]     "leg gave away"  . Gabapentin     Memory loss and confusion  . Lipitor [Atorvastatin] Other (See Comments)    Severe leg pain  . Lovastatin     myalgia  . Simvastatin     Her leg gave away on her and states thinks secondary to Simvastatin  . Vigamox [Moxifloxacin]     rash  . Welchol [Colesevelam Hcl]     ROS As per HPI  PE: Blood pressure 124/77, pulse 54, temperature 98.1 F (36.7 C), temperature source Oral, resp. rate 16, height 5' 0.5" (1.537 m), weight 143 lb 12 oz (65.205 kg), SpO2 92 %. Gen: Alert, well appearing.  Patient is oriented to person, place, time, and situation. CV: RRR, no m/r/g.   LUNGS: CTA bilat, nonlabored resps, good aeration in all lung fields. EXT: 1+ pitting edema bilat, no clubbing or cyanosis. SKIN: left flank with a nickel-sized splotch of pinkish erythema with ill-defined borders, +blanchable.  Nontender.  LABS:  Lab Results  Component Value Date   TSH 1.36 05/07/2012   Lab Results  Component Value Date   WBC 5.0 05/02/2015   HGB 14.2 05/02/2015   HCT 42.1 05/02/2015   MCV 94.4 05/02/2015   PLT 172 05/02/2015   Lab Results  Component Value Date   CREATININE 0.77 05/02/2015   BUN 20 05/02/2015   NA 137 05/02/2015   K 4.5 05/02/2015   CL 101 05/02/2015   CO2 25 05/02/2015   Lab Results  Component Value Date   ALT 22 05/02/2015   AST 21 05/02/2015   ALKPHOS 54 05/02/2015   BILITOT 0.4 05/02/2015   Lab Results  Component Value Date   CHOL 222* 02/01/2015   Lab Results  Component Value Date   HDL 72.50 02/01/2015   Lab Results  Component Value Date   LDLCALC 138* 02/01/2015   Lab Results  Component Value Date   TRIG 57.0 02/01/2015   Lab Results  Component Value Date   CHOLHDL 3 02/01/2015   IMPRESSION AND PLAN:  1) HTN: well controlled but she feels some fatigue sometimes when diastolic is in the [id number], and she thinks this has only been happening since her amlodipine was  added.  We agreed she could try taking this pill every other day and see how she feels and how her bp does. Check BMET today.  2) HLD: intol of all meds, not dieting right now.  She will try harder with TLC and we'll recheck FLP next f/u  visit in 6 mo.  3) Small fiber neuropathy of feet: she is responding well to benfotiamine 150mg  qd that she gets at the vitamin store.  4) Hx of questionable erythema migrans rash.  Treated with 7d of doxy and acute titers neg. Rash nearly completely gone now.  Will draw convalescent titers today.  An After Visit Summary was printed and given to the patient.  FOLLOW UP: Return in about 6 months (around 02/02/2016) for routine chronic illness f/u (fasting).  Signed:  Crissie Sickles, MD           08/02/2015

## 2015-08-02 NOTE — Progress Notes (Signed)
Pre visit review using our clinic review tool, if applicable. No additional management support is needed unless otherwise documented below in the visit note. 

## 2015-08-03 LAB — LYME AB/WESTERN BLOT REFLEX

## 2015-09-05 ENCOUNTER — Other Ambulatory Visit: Payer: Self-pay | Admitting: *Deleted

## 2015-09-05 DIAGNOSIS — F419 Anxiety disorder, unspecified: Secondary | ICD-10-CM

## 2015-09-05 MED ORDER — ALPRAZOLAM 0.25 MG PO TABS: 0.2500 mg | ORAL_TABLET | Freq: Every day | ORAL | Status: DC | PRN

## 2015-09-05 NOTE — Telephone Encounter (Signed)
Rx faxed

## 2015-09-05 NOTE — Telephone Encounter (Signed)
[facility] Fax: [phone]  RF request for alprazolam LOV: 08/02/15 Next ov: 01/31/16 Last written: 05/23/14 #30 w/ 0RF  Please advise. Thanks.

## 2015-10-06 ENCOUNTER — Encounter: Payer: Self-pay | Admitting: Family Medicine

## 2015-10-06 ENCOUNTER — Ambulatory Visit (INDEPENDENT_AMBULATORY_CARE_PROVIDER_SITE_OTHER): Payer: Medicare Other | Admitting: Family Medicine

## 2015-10-06 VITALS — BP 133/83 | HR 65 | Temp 98.7°F | Resp 16 | Ht 60.5 in | Wt 142.5 lb

## 2015-10-06 DIAGNOSIS — W57XXXA Bitten or stung by nonvenomous insect and other nonvenomous arthropods, initial encounter: Secondary | ICD-10-CM | POA: Diagnosis not present

## 2015-10-06 DIAGNOSIS — T148 Other injury of unspecified body region: Secondary | ICD-10-CM

## 2015-10-06 DIAGNOSIS — L821 Other seborrheic keratosis: Secondary | ICD-10-CM

## 2015-10-06 NOTE — Progress Notes (Signed)
Pre visit review using our clinic review tool, if applicable. No additional management support is needed unless otherwise documented below in the visit note. 

## 2015-10-06 NOTE — Progress Notes (Signed)
OFFICE VISIT  10/06/2015   CC:  Chief Complaint  Patient presents with  . Insect Bite    found tick this morning  . Bump/lesion on bottom    found it this morning   HPI:    Patient is a 82 y.o. Caucasian female who presents for "bump on bottom". Noted this morning when she was searching for ticks.  She had found a tick on L glut region. Then she was using a mirror in the bathtub to look in the privates/GU area and noted a bluish spot on skin that worried her, so she came in.  She brought the tick in with her today: it appears consistent with a [place] tick.  Past Medical History  Diagnosis Date  . Hypertension   . Anxiety   . Vertigo   . Coronary artery disease   . Hyperlipidemia     Intolerant of statins and zetia.  Started metamucil trial 09/2014  . BCC (basal cell carcinoma), face 11/06/2010  . SCC (squamous cell carcinoma), arm 11/06/2010  . Bilateral bunions 11/06/2010  . Peripheral neuropathy ([place]) 11/06/2010    Small fiber, non-diabetic per neurologist  . Constipation 11/06/2010  . Osteopenia 11/23/2010  . History of hyperkalemia 01/23/2011  . Allergic state 04/30/2011  . Seborrheic keratosis 04/30/2011  . Lesion of labia 02/04/2012  . GERD (gastroesophageal reflux disease) 05/11/2012  . Osteoarthritis of both knees     R>L  . Right lumbar radiculopathy 01/2015    Improved with PT    Past Surgical History  Procedure Laterality Date  . Cardiac catheterization  12/20/2009    3 bare metal stents  . Tubal ligation    . Eye surgery      cataract- left eye 09-24-11  . Skin biopsy  08/2012    Shave excision of lesion on pt's back: irritated seb keratosis.    Outpatient Prescriptions Prior to Visit  Medication Sig Dispense Refill  . ALPRAZolam (XANAX) 0.25 MG tablet Take 1 tablet (0.25 mg total) by mouth daily as needed. Panic attack 30 tablet 1  . amLODipine (NORVASC) 2.5 MG tablet Take 1 tablet (2.5 mg total) by mouth daily. 180 tablet 0  . aspirin 81 MG tablet Take  81 mg by mouth daily.    Marland Kitchen azelastine (OPTIVAR) 0.05 % ophthalmic solution INSTILL TWO DROPS INTO EACH EYE TWICE DAILY 6 mL 12  . Benfotiamine 150 MG CAPS Take 1 capsule by mouth daily. For numbness in feet    . Cyanocobalamin (VITAMIN B-12 CR PO) Take 1 tablet by mouth daily.     . DiphenhydrAMINE HCl (BENADRYL ALLERGY PO) Take 1 tablet by mouth as needed (allergies).     . enalapril (VASOTEC) 20 MG tablet TAKE ONE TABLET BY MOUTH IN THE MORNING AND TAKE ONE-HALF TABLET AT BEDTIME 135 tablet 3  . Ketotifen Fumarate (ALLERGY EYE DROPS OP) Place 2 drops into both eyes as needed. Red itchy eyes     . magnesium oxide (MAG-OX) 400 MG tablet Take 400 mg by mouth once a week.     . metoprolol succinate (TOPROL-XL) 25 MG 24 hr tablet Take 1 tablet (25 mg total) by mouth daily. 90 tablet 3  . Multiple Vitamin (MULTIVITAMIN) tablet Take 1 tablet by mouth daily. Takes occ    . Omega-3 Fatty Acids (FISH OIL PO) Take 1 capsule by mouth daily.     Marland Kitchen triamcinolone (NASACORT) 55 MCG/ACT AERO nasal inhaler Place 2 sprays into the nose daily.  No facility-administered medications prior to visit.    Allergies  Allergen Reactions  . Chlorthalidone     hyponatremia  . Atenolol     Caused fatigue   . Besivance [Besifloxacin Hcl]     rash  . Codeine Nausea And Vomiting  . Crestor [Rosuvastatin]     "leg gave away"  . Gabapentin     Memory loss and confusion  . Lipitor [Atorvastatin] Other (See Comments)    Severe leg pain  . Lovastatin     myalgia  . Simvastatin     Her leg gave away on her and states thinks secondary to Simvastatin  . Vigamox [Moxifloxacin]     rash  . Welchol [Colesevelam Hcl]     ROS As per HPI  PE: Blood pressure 133/83, pulse 65, temperature 98.7 F (37.1 C), temperature source Oral, resp. rate 16, height 5' 0.5" (1.537 m), weight 142 lb 8 oz (64.638 kg), SpO2 91 %.  Pt examined with Sharen Hones, CMA, as chaperone.  Gen: Alert, well appearing.  Patient is oriented  to person, place, time, and situation. AFFECT: pleasant, lucid thought and speech. Inferior aspect of L glut region there is a dime-sized blanching macular patch of skin that is pinkish in color-- consistent with an insect bite---this is where she pulled the tick off.  No target lesion is present.  No tenderness.  On R side of mons pubis she has an approx 4 mm, rough/hyperkeratotic, brown papular lesion that appears "stuck-on".  LABS:  none  IMPRESSION AND PLAN:  1) Tick bite, L glut.  No sign of complication at this time. She can apply otc hydrocortisone ointment to the bite prn. Signs/symptoms to call or return for were reviewed and pt expressed understanding.  2) Seborrheic keratosis R side of mons pubis. Reassured pt of benign nature of lesion.  Expectant management.  An After Visit Summary was printed and given to the patient.  FOLLOW UP: Return if symptoms worsen or fail to improve.  Signed:  Crissie Sickles, MD           10/06/2015

## 2016-01-31 ENCOUNTER — Ambulatory Visit: Payer: BC Managed Care – PPO | Admitting: Family Medicine

## 2016-02-07 ENCOUNTER — Ambulatory Visit: Payer: BC Managed Care – PPO | Admitting: Family Medicine

## 2016-02-08 ENCOUNTER — Encounter: Payer: Self-pay | Admitting: Family Medicine

## 2016-02-08 ENCOUNTER — Ambulatory Visit (INDEPENDENT_AMBULATORY_CARE_PROVIDER_SITE_OTHER): Payer: Medicare Other | Admitting: Family Medicine

## 2016-02-08 VITALS — BP 142/82 | HR 61 | Temp 98.8°F | Resp 16 | Wt 138.0 lb

## 2016-02-08 DIAGNOSIS — E7800 Pure hypercholesterolemia, unspecified: Secondary | ICD-10-CM

## 2016-02-08 DIAGNOSIS — I10 Essential (primary) hypertension: Secondary | ICD-10-CM | POA: Diagnosis not present

## 2016-02-08 DIAGNOSIS — I2510 Atherosclerotic heart disease of native coronary artery without angina pectoris: Secondary | ICD-10-CM

## 2016-02-08 DIAGNOSIS — E2839 Other primary ovarian failure: Secondary | ICD-10-CM

## 2016-02-08 LAB — BASIC METABOLIC PANEL
BUN: 15 mg/dL (ref 6–23)
CHLORIDE: 99 meq/L (ref 96–112)
CO2: 26 meq/L (ref 19–32)
Calcium: 9.9 mg/dL (ref 8.4–10.5)
Creatinine, Ser: 0.74 mg/dL (ref 0.40–1.20)
GFR: 78.15 mL/min (ref >60.00)
Glucose, Bld: 95 mg/dL (ref 70–99)
POTASSIUM: 5 meq/L (ref 3.5–5.1)
SODIUM: 136 meq/L (ref 135–145)

## 2016-02-08 MED ORDER — METOPROLOL SUCCINATE ER 25 MG PO TB24: 25.0000 mg | ORAL_TABLET | Freq: Every day | ORAL | 3 refills | Status: DC

## 2016-02-08 NOTE — Progress Notes (Signed)
OFFICE VISIT  02/08/2016   CC:  Chief Complaint  Patient presents with  . Follow-up    HTN   HPI:    Patient is a 82 y.o. Caucasian female who presents for 6 mo f/u HTN, HLD, CAD. She used to see [name] for cardiology/CAD with 3 BMS--he has retired and she needs to be set up with Dr. Oval Linsey.  She has no CP, SOB, arm pain, nausea, or palpitations.  HTN: once a month checks at [facility] are "okay" per pt.  She is very much against changing bp meds or adding anything new at this time.  Change seems to confuse her quite a bit. She exercises for 1 hour 3 times per week.  She has been "running around" a lot this morning and is also stressed about having lots of visitors for the holidays.  HLD: hx of hyperlipidemia but has long hx of statin intolerance/tried multiple, is not going to try another.   Past Medical History:  Diagnosis Date  . Allergic state 04/30/2011  . Anxiety   . BCC (basal cell carcinoma), face 11/06/2010  . Bilateral bunions 11/06/2010  . Constipation 11/06/2010  . Coronary artery disease   . GERD (gastroesophageal reflux disease) 05/11/2012  . History of hyperkalemia 01/23/2011  . Hyperlipidemia    Intolerant of statins and zetia.  Started metamucil trial 09/2014  . Hypertension   . Lesion of labia 02/04/2012  . Osteoarthritis of both knees    R>L  . Osteopenia 11/23/2010  . Peripheral neuropathy ([place]) 11/06/2010   Small fiber, non-diabetic per neurologist  . Right lumbar radiculopathy 01/2015   Improved with PT  . SCC (squamous cell carcinoma), arm 11/06/2010  . Seborrheic keratosis 04/30/2011  . Vertigo     Past Surgical History:  Procedure Laterality Date  . CARDIAC CATHETERIZATION  12/20/2009   3 bare metal stents  . EYE SURGERY     cataract- left eye 09-24-11  . SKIN BIOPSY  08/2012   Shave excision of lesion on pt's back: irritated seb keratosis.  . TRANSTHORACIC ECHOCARDIOGRAM  12/05/2009   Mild LVH, normal systolic fxn, mild impaired  relaxation.  No signif valvular dz.  . TUBAL LIGATION      Outpatient Medications Prior to Visit  Medication Sig Dispense Refill  . ALPRAZolam (XANAX) 0.25 MG tablet Take 1 tablet (0.25 mg total) by mouth daily as needed. Panic attack 30 tablet 1  . amLODipine (NORVASC) 2.5 MG tablet Take 1 tablet (2.5 mg total) by mouth daily. 180 tablet 0  . aspirin 81 MG tablet Take 81 mg by mouth daily.    Marland Kitchen azelastine (OPTIVAR) 0.05 % ophthalmic solution INSTILL TWO DROPS INTO EACH EYE TWICE DAILY 6 mL 12  . Benfotiamine 150 MG CAPS Take 1 capsule by mouth daily. For numbness in feet    . Cyanocobalamin (VITAMIN B-12 CR PO) Take 1 tablet by mouth daily.     . DiphenhydrAMINE HCl (BENADRYL ALLERGY PO) Take 1 tablet by mouth as needed (allergies).     . enalapril (VASOTEC) 20 MG tablet TAKE ONE TABLET BY MOUTH IN THE MORNING AND TAKE ONE-HALF TABLET AT BEDTIME 135 tablet 3  . Ketotifen Fumarate (ALLERGY EYE DROPS OP) Place 2 drops into both eyes as needed. Red itchy eyes     . magnesium oxide (MAG-OX) 400 MG tablet Take 400 mg by mouth once a week.     . Multiple Vitamin (MULTIVITAMIN) tablet Take 1 tablet by mouth daily. Takes occ    .  Omega-3 Fatty Acids (FISH OIL PO) Take 1 capsule by mouth daily.     Marland Kitchen triamcinolone (NASACORT) 55 MCG/ACT AERO nasal inhaler Place 2 sprays into the nose daily.    . metoprolol succinate (TOPROL-XL) 25 MG 24 hr tablet Take 1 tablet (25 mg total) by mouth daily. 90 tablet 3   No facility-administered medications prior to visit.     Allergies  Allergen Reactions  . Chlorthalidone     hyponatremia  . Atenolol     Caused fatigue   . Besivance [Besifloxacin Hcl]     rash  . Codeine Nausea And Vomiting  . Crestor [Rosuvastatin]     "leg gave away"  . Gabapentin     Memory loss and confusion  . Lipitor [Atorvastatin] Other (See Comments)    Severe leg pain  . Lovastatin     myalgia  . Simvastatin     Her leg gave away on her and states thinks secondary to  Simvastatin  . Vigamox [Moxifloxacin]     rash  . Welchol [Colesevelam Hcl]     ROS As per HPI  PE: Blood pressure (!) 161/77, pulse 61, temperature 98.8 F (37.1 C), temperature source Temporal, resp. rate 16, weight 138 lb (62.6 kg), SpO2 97 %. Manual bp repeat today 142/82. Gen: Alert, well appearing.  Patient is oriented to person, place, time, and situation. AFFECT: pleasant, lucid thought and speech. CV: RRR, no m/r/g.   LUNGS: CTA bilat, nonlabored resps, good aeration in all lung fields. EXT: no clubbing, cyanosis, or edema.   LABS:  None today  IMPRESSION AND PLAN:  1) HTN; fairly stable.  At this point the patient is not amenable to monitoring her bp any more than she already does, nor is she open to adding/increasing/changing meds at this time. BMET today.  2) Hyperlipidemia: statin intol and not willing to try another.   Continue diet and exercise.  3) CAD, hx of 3 stents: since her cardiologist has retired we'll get her set up to see Dr. Skeet Latch with [facility].  4) Osteoporosis screening: DEXA ordered today.  An After Visit Summary was printed and given to the patient.  FOLLOW UP: Return in about 3 months (around 05/10/2016) for AWV, then 6 mo o/v for routine chronic illness f/u.  Signed:  Crissie Sickles, MD           02/08/2016

## 2016-02-08 NOTE — Progress Notes (Signed)
Pre visit review using our clinic review tool, if applicable. No additional management support is needed unless otherwise documented below in the visit note. 

## 2016-03-05 HISTORY — PX: OTHER SURGICAL HISTORY: SHX169

## 2016-03-06 ENCOUNTER — Ambulatory Visit (INDEPENDENT_AMBULATORY_CARE_PROVIDER_SITE_OTHER): Payer: Medicare Other

## 2016-03-06 ENCOUNTER — Encounter: Payer: Self-pay | Admitting: Family Medicine

## 2016-03-06 DIAGNOSIS — M85852 Other specified disorders of bone density and structure, left thigh: Secondary | ICD-10-CM

## 2016-03-06 DIAGNOSIS — E2839 Other primary ovarian failure: Secondary | ICD-10-CM

## 2016-03-14 ENCOUNTER — Encounter: Payer: Self-pay | Admitting: Cardiovascular Disease

## 2016-03-14 ENCOUNTER — Ambulatory Visit (INDEPENDENT_AMBULATORY_CARE_PROVIDER_SITE_OTHER): Payer: Medicare Other | Admitting: Cardiovascular Disease

## 2016-03-14 VITALS — BP 131/81 | HR 75 | Ht 60.0 in | Wt 139.8 lb

## 2016-03-14 DIAGNOSIS — E7800 Pure hypercholesterolemia, unspecified: Secondary | ICD-10-CM | POA: Diagnosis not present

## 2016-03-14 DIAGNOSIS — I259 Chronic ischemic heart disease, unspecified: Secondary | ICD-10-CM | POA: Diagnosis not present

## 2016-03-14 DIAGNOSIS — I2510 Atherosclerotic heart disease of native coronary artery without angina pectoris: Secondary | ICD-10-CM | POA: Diagnosis not present

## 2016-03-14 DIAGNOSIS — I10 Essential (primary) hypertension: Secondary | ICD-10-CM

## 2016-03-14 NOTE — Progress Notes (Signed)
Cardiology Office Note   Date:  03/14/2016   ID:  Jennifer Peck, DOB 18-Jun-1922, MRN [id number]  PCP:  Tammi Sou, MD  Cardiologist:   Skeet Latch, MD   Chief Complaint  Patient presents with  . New Patient (Initial Visit)  . Shortness of Breath    Randomly.      History of Present Illness: Jennifer Peck is a 82 y.o. female with CAD s/p PCI, hypertension and hyperlipidemia who presents for follow-up.  Jennifer Peck was previously patient of [name]. She last saw him 02/2015.  She had bare metal stents placed in her left circumflex, RCA, OM 1 and OM 2 vessels in 2011.  She has not tolerated statin medications in the past due to myalgias.  She has tried to manage her lipids with diet and exercise.  [facility].  She denies chest pain or shortness of breath with this activity. She has not noted any lower extremity edema, orthopnea, or PND.  She sometimes notes numbness in her legs that improves with exercise.  She keeps busy with computer classes, craft classes and swimming in the summer time.    Past Medical History:  Diagnosis Date  . Allergic state 04/30/2011  . Anxiety   . BCC (basal cell carcinoma), face 11/06/2010  . Bilateral bunions 11/06/2010  . Constipation 11/06/2010  . Coronary artery disease   . GERD (gastroesophageal reflux disease) 05/11/2012  . History of hyperkalemia 01/23/2011  . Hyperlipidemia    Intolerant of statins and zetia.  Started metamucil trial 09/2014  . Hypertension   . Lesion of labia 02/04/2012  . Osteoarthritis of both knees    R>L  . Osteopenia 11/23/2010; 02/2016   2017 T-score -1.9.  Repeat DEXA 2 yrs.  . Peripheral neuropathy ([place]) 11/06/2010   Small fiber, non-diabetic per neurologist  . Right lumbar radiculopathy 01/2015   Improved with PT  . SCC (squamous cell carcinoma), arm 11/06/2010  . Seborrheic keratosis 04/30/2011  . Vertigo     Past Surgical History:  Procedure  Laterality Date  . CARDIAC CATHETERIZATION  12/20/2009   3 bare metal stents  . DEXA  03/05/2016   T-score -1.9  . EYE SURGERY     cataract- left eye 09-24-11  . SKIN BIOPSY  08/2012   Shave excision of lesion on pt's back: irritated seb keratosis.  . TRANSTHORACIC ECHOCARDIOGRAM  12/05/2009   Mild LVH, normal systolic fxn, mild impaired relaxation.  No signif valvular dz.  . TUBAL LIGATION       Current Outpatient Prescriptions  Medication Sig Dispense Refill  . ALPRAZolam (XANAX) 0.25 MG tablet Take 1 tablet (0.25 mg total) by mouth daily as needed. Panic attack 30 tablet 1  . amLODipine (NORVASC) 2.5 MG tablet Take 1 tablet (2.5 mg total) by mouth daily. 180 tablet 0  . aspirin 81 MG tablet Take 81 mg by mouth daily.    Marland Kitchen azelastine (OPTIVAR) 0.05 % ophthalmic solution INSTILL TWO DROPS INTO EACH EYE TWICE DAILY 6 mL 12  . Benfotiamine 150 MG CAPS Take 1 capsule by mouth daily. For numbness in feet    . Cyanocobalamin (VITAMIN B-12 CR PO) Take 1 tablet by mouth daily.     . DiphenhydrAMINE HCl (BENADRYL ALLERGY PO) Take 1 tablet by mouth as needed (allergies).     . enalapril (VASOTEC) 20 MG tablet TAKE ONE TABLET BY MOUTH IN THE MORNING AND TAKE ONE-HALF TABLET AT BEDTIME  135 tablet 3  . Ketotifen Fumarate (ALLERGY EYE DROPS OP) Place 2 drops into both eyes as needed. Red itchy eyes     . magnesium oxide (MAG-OX) 400 MG tablet Take 400 mg by mouth once a week.     . metoprolol succinate (TOPROL-XL) 25 MG 24 hr tablet Take 1 tablet (25 mg total) by mouth daily. 90 tablet 3  . Multiple Vitamin (MULTIVITAMIN) tablet Take 1 tablet by mouth daily. Takes occ    . Omega-3 Fatty Acids (FISH OIL PO) Take 1 capsule by mouth daily.     Marland Kitchen triamcinolone (NASACORT) 55 MCG/ACT AERO nasal inhaler Place 2 sprays into the nose daily.     No current facility-administered medications for this visit.     Allergies:   Chlorthalidone; Atenolol; Besivance [besifloxacin hcl]; Codeine; Crestor  [rosuvastatin]; Gabapentin; Lipitor [atorvastatin]; Lovastatin; Simvastatin; Vigamox [moxifloxacin]; and Welchol [colesevelam hcl]    Social History:  The patient  reports that she quit smoking about 38 years ago. She has never used smokeless tobacco. She reports that she drinks alcohol. She reports that she does not use drugs.   Family History:  The patient's family history includes Asthma in her paternal grandfather; Cancer in her daughter; Heart disease in her father; Hyperlipidemia in her daughter and son; Hypertension in her daughter, daughter, son, son, and son; Stroke in her mother and paternal grandmother; Vision loss in her maternal grandfather.    ROS:  Please see the history of present illness.   Otherwise, review of systems are positive for none.   All other systems are reviewed and negative.    PHYSICAL EXAM: VS:  BP 131/81   Pulse 75   Ht 5' (1.524 m)   Wt 63.4 kg (139 lb 12.8 oz)   BMI 27.30 kg/m  , BMI Body mass index is 27.3 kg/m. GENERAL:  Well appearing HEENT:  Pupils equal round and reactive, fundi not visualized, oral mucosa unremarkable NECK:  No jugular venous distention, waveform within normal limits, carotid upstroke brisk and symmetric, no bruits, no thyromegaly LYMPHATICS:  No cervical adenopathy LUNGS:  Clear to auscultation bilaterally HEART: Mostly regular with occasional ectopy.  PMI not displaced or sustained,S1 and S2 within normal limits, no S3, no S4, no clicks, no rubs, no murmurs ABD:  Flat, positive bowel sounds normal in frequency in pitch, no bruits, no rebound, no guarding, no midline pulsatile mass, no hepatomegaly, no splenomegaly EXT:  2 plus pulses throughout, no edema, no cyanosis no clubbing SKIN:  No rashes no nodules NEURO:  Cranial nerves II through XII grossly intact, motor grossly intact throughout PSYCH:  Cognitively intact, oriented to person place and time   EKG:  EKG is ordered today. The ekg ordered today demonstrates sinus  rhythm. Rate 77 bpm.     Recent Labs: 05/02/2015: ALT 22; B Natriuretic Peptide 154.6; Hemoglobin 14.2; Platelets 172 02/08/2016: BUN 15; Creatinine, Ser 0.74; Potassium 5.0; Sodium 136    Lipid Panel    Component Value Date/Time   CHOL 222 (H) 02/01/2015 0952   TRIG 57.0 02/01/2015 0952   HDL 72.50 02/01/2015 0952   CHOLHDL 3 02/01/2015 0952   VLDL 11.4 02/01/2015 0952   LDLCALC 138 (H) 02/01/2015 0952   LDLDIRECT 128.0 12/02/2012 0837      Wt Readings from Last 3 Encounters:  03/14/16 63.4 kg (139 lb 12.8 oz)  02/08/16 62.6 kg (138 lb)  10/06/15 64.6 kg (142 lb 8 oz)      ASSESSMENT AND PLAN:  #  CAD: Ms. Mulrooney is doing well.  She is very active and denies chest pain.  Continue aspirin and metoprolol.  # Hyperlipidemia: LDL has been elevated.  She has tried multiple statins and didn't tolerate them.  We will have her come back to lipid clinic and get her lipids checked.  She will consider Repatha.  # Hypertension: BP well-controlled.  Continue amlodipine, enalapril, and metoprolol.   Current medicines are reviewed at length with the patient today.  The patient does not have concerns regarding medicines.  The following changes have been made:  no change  Labs/ tests ordered today include:   Orders Placed This Encounter  Procedures  . EKG 12-Lead   Time spent: 45 minutes-Greater than 50% of this time was spent in counseling, explanation of diagnosis, planning of further management, and coordination of care.   Disposition:   FU with Naiya Corral C. Oval Linsey, MD, [facility] in 1 year.      This note was written with the assistance of speech recognition software.  Please excuse any transcriptional errors.  Signed, Sekai Gitlin C. Oval Linsey, MD, [facility]  03/14/2016 2:42 PM    [facility]

## 2016-03-14 NOTE — Patient Instructions (Signed)
NO CHANGE WITH MEDICATIONS   Your physician recommends that you schedule a follow-up appointment in [facility] AT [place] physician wants you to follow-up in 12 [place] will receive a reminder letter in the mail two months in advance. If you don't receive a letter, please call our office to schedule the follow-up appointment.  If you need a refill on your cardiac medications before your next appointment, please call your pharmacy.

## 2016-04-13 ENCOUNTER — Other Ambulatory Visit: Payer: Self-pay | Admitting: Family Medicine

## 2016-04-18 ENCOUNTER — Ambulatory Visit: Payer: Medicare Other

## 2016-04-27 NOTE — Progress Notes (Signed)
Pre visit review using our clinic review tool, if applicable. No additional management support is needed unless otherwise documented below in the visit note. 

## 2016-04-27 NOTE — Progress Notes (Signed)
Subjective:   Jennifer Peck is a 83 y.o. female who presents for an Initial Medicare Annual Wellness Visit.  Review of Systems    No ROS.  Medicare Wellness Visit.   Cardiac Risk Factors include: advanced age (>26men, >82 women);dyslipidemia;family history of premature cardiovascular disease;hypertension   Sleep patterns: sleeps at least 8 hours. Up to void x 2.  Home Safety/Smoke Alarms:  Smoke detectors and security in place.  Living environment; residence and Firearm Safety: Lives alone in single story home, steps going in home. Rail in place. Closest family member 91 minutes away. Feels safe in home.  Seat Belt Safety/Bike Helmet: Wears seat belt.   Counseling:   Eye Exam-Last exam 02/2016, every 2 years. [place], [place].  Dental-Last exam 02/2016, every 6 months. Dr. Sherwood Gambler.   Female:   [id number]       Mammo-05/20/2006, negative. Declines further testing.       Dexa scan-03/06/2016,  Osteopenia     CCS-Colonoscopy 05/17/2004, normal. No recall.       Objective:    Today's Vitals   04/30/16 1021  BP: (!) 142/82  Pulse: 72  SpO2: 96%  Weight: 138 lb 12.8 oz (63 kg)  Height: 5' (1.524 m)  PainSc: 3    Body mass index is 27.11 kg/m.   Current Medications (verified) Outpatient Encounter Prescriptions as of 04/30/2016  Medication Sig  . ALPRAZolam (XANAX) 0.25 MG tablet Take 1 tablet (0.25 mg total) by mouth daily as needed. Panic attack  . amLODipine (NORVASC) 2.5 MG tablet TAKE ONE TABLET BY MOUTH ONCE DAILY  . aspirin 81 MG tablet Take 81 mg by mouth daily.  Marland Kitchen azelastine (OPTIVAR) 0.05 % ophthalmic solution INSTILL TWO DROPS INTO EACH EYE TWICE DAILY  . Benfotiamine 150 MG CAPS Take 1 capsule by mouth daily. For numbness in feet  . Cyanocobalamin (VITAMIN B-12 CR PO) Take 1 tablet by mouth daily.   . DiphenhydrAMINE HCl (BENADRYL ALLERGY PO) Take 1 tablet by mouth as needed (allergies).   . enalapril (VASOTEC) 20 MG tablet TAKE ONE TABLET BY  MOUTH IN THE MORNING AND TAKE ONE-HALF TABLET AT BEDTIME  . fluticasone (KLS ALLER-FLO) 50 MCG/ACT nasal spray Place into both nostrils daily.  Marland Kitchen Ketotifen Fumarate (ALLERGY EYE DROPS OP) Place 2 drops into both eyes as needed. Red itchy eyes   . magnesium oxide (MAG-OX) 400 MG tablet Take 400 mg by mouth once a week.   . metoprolol succinate (TOPROL-XL) 25 MG 24 hr tablet Take 1 tablet (25 mg total) by mouth daily.  . Multiple Vitamin (MULTIVITAMIN) tablet Take 1 tablet by mouth daily. Takes occ  . Omega-3 Fatty Acids (FISH OIL PO) Take 1 capsule by mouth daily.   . [DISCONTINUED] triamcinolone (NASACORT) 55 MCG/ACT AERO nasal inhaler Place 2 sprays into the nose daily.   No facility-administered encounter medications on file as of 04/30/2016.     Allergies (verified) Chlorthalidone; Atenolol; Besivance [besifloxacin hcl]; Codeine; Crestor [rosuvastatin]; Gabapentin; Lipitor [atorvastatin]; Lovastatin; Simvastatin; Vigamox [moxifloxacin]; and Welchol [colesevelam hcl]   History: Past Medical History:  Diagnosis Date  . Allergic state 04/30/2011  . Anxiety   . BCC (basal cell carcinoma), face 11/06/2010  . Bilateral bunions 11/06/2010  . Constipation 11/06/2010  . Coronary artery disease 2011   BMS x 3  . GERD (gastroesophageal reflux disease) 05/11/2012  . History of hyperkalemia 01/23/2011  . Hyperlipidemia    Intolerant of statins and zetia.  Started metamucil trial 09/2014  .  Hypertension   . Lesion of labia 02/04/2012  . Osteoarthritis of both knees    R>L  . Osteopenia 11/23/2010; 02/2016   2017 T-score -1.9.  Repeat DEXA 2 yrs.  . Peripheral neuropathy ([place]) 11/06/2010   Small fiber, non-diabetic per neurologist  . Right lumbar radiculopathy 01/2015   Improved with PT  . SCC (squamous cell carcinoma), arm 11/06/2010  . Seborrheic keratosis 04/30/2011  . Vertigo    Past Surgical History:  Procedure Laterality Date  . CARDIAC CATHETERIZATION  12/20/2009   3 bare metal stents  .  DEXA  03/05/2016   T-score -1.9  . EYE SURGERY     cataract- left eye 09-24-11  . SKIN BIOPSY  08/2012   Shave excision of lesion on pt's back: irritated seb keratosis.  . TRANSTHORACIC ECHOCARDIOGRAM  12/05/2009   Mild LVH, normal systolic fxn, mild impaired relaxation.  No signif valvular dz.  . TUBAL LIGATION     Family History  Problem Relation Age of Onset  . Stroke Mother   . Heart disease Father   . Hypertension Daughter   . Hyperlipidemia Daughter   . Hypertension Son   . Cancer Daughter     rectal/ chemo and radiation  . Hypertension Daughter   . Hypertension Son   . Hyperlipidemia Son   . Hypertension Son   . Vision loss Maternal Grandfather   . Stroke Paternal Grandmother   . Asthma Paternal Grandfather    Social History   Occupational History  . Not on file.   Social History Main Topics  . Smoking status: Former Smoker    Quit date: 09/04/1977  . Smokeless tobacco: Never Used  . Alcohol use Yes     Comment: only on Holidays  . Drug use: No  . Sexual activity: Not on file    Tobacco Counseling Counseling given: No   Activities of Daily Living In your present state of health, do you have any difficulty performing the following activities: 04/30/2016  Hearing? N  Vision? N  Difficulty concentrating or making decisions? N  Walking or climbing stairs? N  Dressing or bathing? N  Doing errands, shopping? N  Preparing Food and eating ? N  Using the Toilet? N  In the past six months, have you accidently leaked urine? N  Do you have problems with loss of bowel control? N  Managing your Medications? N  Managing your Finances? N  Housekeeping or managing your Housekeeping? N  Some recent data might be hidden    Immunizations and Health Maintenance Immunization History  Administered Date(s) Administered  . Influenza Split 12/22/2010, 12/21/2011  . Influenza Whole 02/15/2010  . Influenza, High Dose Seasonal PF 02/01/2015  . Influenza,inj,Quad PF,36+ Mos  12/02/2012, 01/21/2014  . Influenza-Unspecified 01/18/2016  . Pneumococcal Conjugate-13 02/10/2014  . Pneumococcal Polysaccharide-23 12/17/2012  . Td 02/15/2010  . Zoster 03/20/2007   There are no preventive care reminders to display for this patient.  Patient Care Team: Tammi Sou, MD as PCP - General (Family Medicine) Skeet Latch, MD as Consulting Physician (Cardiology)  Indicate any recent Medical Services you may have received from other than [facility] providers in the past year (date may be approximate).     Assessment:   This is a routine wellness examination for [facility]. Physical assessment deferred to PCP.   Hearing/Vision screen Hearing Screening Comments: Able to hear conversational tones w/o difficulty. No issues reported.   Vision Screening Comments: Wears reading glasses.   Dietary issues and exercise activities  discussed: Current Exercise Habits: Structured exercise class, Type of exercise: stretching;strength training/weights;exercise ball (aerobics, [place] chi), Time (Minutes): 60, Frequency (Times/Week): 4, Weekly Exercise (Minutes/Week): 240, Exercise limited by: None identified   Diet (meal preparation, eat out, water intake, caffeinated beverages, dairy products, fruits and vegetables): Drinks coffee, sips of Coke, milk, and orange juice. Water with medication.   Breakfast: oats, nuts, fruit Lunch: sandwich Dinner: protein, vegetables.     Encouraged to continue to eat healthy and remain active.   Goals      Patient Stated   . Maintain (pt-stated)          Patient would like to maintain current health status, by continuing to be active and eat healthy.       Depression Screen PHQ 2/9 Scores 04/30/2016 02/08/2016 09/28/2014  PHQ - 2 Score 0 0 0    Fall Risk Fall Risk  04/30/2016 02/08/2016 09/28/2014  Falls in the past year? No No Yes  Number falls in past yr: - - 1  Injury with Fall? - - Yes    Cognitive Function: MMSE - Mini Mental State Exam  04/30/2016  Orientation to time 5  Orientation to Place 5  Registration 3  Attention/ Calculation 5  Recall 3  Language- name 2 objects 2  Language- repeat 1  Language- follow 3 step command 3  Language- read & follow direction 1  Write a sentence 1  Copy design 1  Total score 30        Screening Tests Health Maintenance  Topic Date Due  . TETANUS/TDAP  02/16/2020  . INFLUENZA VACCINE  Completed  . DEXA SCAN  Completed  . ZOSTAVAX  Completed  . PNA vac Low Risk Adult  Completed      Plan:     Continue to eat heart healthy diet (full of fruits, vegetables, whole grains, lean protein, water--limit salt, fat, and sugar intake) and increase physical activity as tolerated.  Continue doing brain stimulating activities (puzzles, reading, adult coloring books, staying active) to keep memory sharp.   Try Ensure supplement.  **Patient requesting referrals for podiatry (callus) and dermatology (general skin check)**  **Patient would like to discuss Repatha (recommended by cardiology) at next appointment.**   During the course of the visit, Carlyne was educated and counseled about the following appropriate screening and preventive services:   Vaccines to include Pneumoccal, Influenza, Hepatitis B, Td, Zostavax, HCV  Cardiovascular disease screening  Colorectal cancer screening  Bone density screening  Diabetes screening  Glaucoma screening  Mammography/PAP  Nutrition counseling   Patient Instructions (the written plan) were given to the patient.    Gerilyn Nestle, RN   04/30/2016

## 2016-04-30 ENCOUNTER — Telehealth: Payer: Self-pay

## 2016-04-30 ENCOUNTER — Ambulatory Visit (INDEPENDENT_AMBULATORY_CARE_PROVIDER_SITE_OTHER): Payer: Medicare Other

## 2016-04-30 VITALS — BP 142/82 | HR 72 | Ht 60.0 in | Wt 138.8 lb

## 2016-04-30 DIAGNOSIS — Z0000 Encounter for general adult medical examination without abnormal findings: Secondary | ICD-10-CM

## 2016-04-30 DIAGNOSIS — Z1283 Encounter for screening for malignant neoplasm of skin: Secondary | ICD-10-CM

## 2016-04-30 DIAGNOSIS — L84 Corns and callosities: Secondary | ICD-10-CM

## 2016-04-30 NOTE — Telephone Encounter (Signed)
Patient in for AWV today, requesting referral/recommendation for dermatologist (general skin check) and podiatrist (recurrent callus). Please advise.

## 2016-04-30 NOTE — Progress Notes (Signed)
AWV reviewed and I agree.  Signed:  Crissie Sickles, MD           04/30/2016

## 2016-04-30 NOTE — Telephone Encounter (Signed)
OK, both referrals ordered 

## 2016-04-30 NOTE — Patient Instructions (Addendum)
Continue to eat heart healthy diet (full of fruits, vegetables, whole grains, lean protein, water--limit salt, fat, and sugar intake) and increase physical activity as tolerated.  Continue doing brain stimulating activities (puzzles, reading, adult coloring books, staying active) to keep memory sharp.   Try Ensure supplement.   Fall Prevention in the Home Introduction Falls can cause injuries. They can happen to people of all ages. There are many things you can do to make your home safe and to help prevent falls. What can I do on the outside of my home?  Regularly fix the edges of walkways and driveways and fix any cracks.  Remove anything that might make you trip as you walk through a door, such as a raised step or threshold.  Trim any bushes or trees on the path to your home.  Use bright outdoor lighting.  Clear any walking paths of anything that might make someone trip, such as rocks or tools.  Regularly check to see if handrails are loose or broken. Make sure that both sides of any steps have handrails.  Any raised decks and porches should have guardrails on the edges.  Have any leaves, snow, or ice cleared regularly.  Use sand or salt on walking paths during winter.  Clean up any spills in your garage right away. This includes oil or grease spills. What can I do in the bathroom?  Use night lights.  Install grab bars by the toilet and in the tub and shower. Do not use towel bars as grab bars.  Use non-skid mats or decals in the tub or shower.  If you need to sit down in the shower, use a plastic, non-slip stool.  Keep the floor dry. Clean up any water that spills on the floor as soon as it happens.  Remove soap buildup in the tub or shower regularly.  Attach bath mats securely with double-sided non-slip rug tape.  Do not have throw rugs and other things on the floor that can make you trip. What can I do in the bedroom?  Use night lights.  Make sure that you  have a light by your bed that is easy to reach.  Do not use any sheets or blankets that are too big for your bed. They should not hang down onto the floor.  Have a firm chair that has side arms. You can use this for support while you get dressed.  Do not have throw rugs and other things on the floor that can make you trip. What can I do in the kitchen?  Clean up any spills right away.  Avoid walking on wet floors.  Keep items that you use a lot in easy-to-reach places.  If you need to reach something above you, use a strong step stool that has a grab bar.  Keep electrical cords out of the way.  Do not use floor polish or wax that makes floors slippery. If you must use wax, use non-skid floor wax.  Do not have throw rugs and other things on the floor that can make you trip. What can I do with my stairs?  Do not leave any items on the stairs.  Make sure that there are handrails on both sides of the stairs and use them. Fix handrails that are broken or loose. Make sure that handrails are as long as the stairways.  Check any carpeting to make sure that it is firmly attached to the stairs. Fix any carpet that is loose or worn.  Avoid having throw rugs at the top or bottom of the stairs. If you do have throw rugs, attach them to the floor with carpet tape.  Make sure that you have a light switch at the top of the stairs and the bottom of the stairs. If you do not have them, ask someone to add them for you. What else can I do to help prevent falls?  Wear shoes that:  Do not have high heels.  Have rubber bottoms.  Are comfortable and fit you well.  Are closed at the toe. Do not wear sandals.  If you use a stepladder:  Make sure that it is fully opened. Do not climb a closed stepladder.  Make sure that both sides of the stepladder are locked into place.  Ask someone to hold it for you, if possible.  Clearly mark and make sure that you can see:  Any grab bars or  handrails.  First and last steps.  Where the edge of each step is.  Use tools that help you move around (mobility aids) if they are needed. These include:  Canes.  Walkers.  Scooters.  Crutches.  Turn on the lights when you go into a dark area. Replace any light bulbs as soon as they burn out.  Set up your furniture so you have a clear path. Avoid moving your furniture around.  If any of your floors are uneven, fix them.  If there are any pets around you, be aware of where they are.  Review your medicines with your doctor. Some medicines can make you feel dizzy. This can increase your chance of falling. Ask your doctor what other things that you can do to help prevent falls. This information is not intended to replace advice given to you by your health care provider. Make sure you discuss any questions you have with your health care provider. Document Released: 12/30/2008 Document Revised: 08/11/2015 Document Reviewed: 04/09/2014  2017 [employer]  Health Maintenance, Female Introduction Adopting a healthy lifestyle and getting preventive care can go a long way to promote health and wellness. Talk with your health care provider about what schedule of regular examinations is right for you. This is a good chance for you to check in with your provider about disease prevention and staying healthy. In between checkups, there are plenty of things you can do on your own. Experts have done a lot of research about which lifestyle changes and preventive measures are most likely to keep you healthy. Ask your health care provider for more information. Weight and diet Eat a healthy diet  Be sure to include plenty of vegetables, fruits, low-fat dairy products, and lean protein.  Do not eat a lot of foods high in solid fats, added sugars, or salt.  Get regular exercise. This is one of the most important things you can do for your health.  Most adults should exercise for at least 150 minutes  each week. The exercise should increase your heart rate and make you sweat (moderate-intensity exercise).  Most adults should also do strengthening exercises at least twice a week. This is in addition to the moderate-intensity exercise. Maintain a healthy weight  Body mass index (BMI) is a measurement that can be used to identify possible weight problems. It estimates body fat based on height and weight. Your health care provider can help determine your BMI and help you achieve or maintain a healthy weight.  For females 26 years of age and older:  A BMI below  18.5 is considered underweight.  A BMI of 18.5 to 24.9 is normal.  A BMI of 25 to 29.9 is considered overweight.  A BMI of 30 and above is considered obese. Watch levels of cholesterol and blood lipids  You should start having your blood tested for lipids and cholesterol at 81 years of age, then have this test every 5 years.  You may need to have your cholesterol levels checked more often if:  Your lipid or cholesterol levels are high.  You are older than 81 years of age.  You are at high risk for heart disease. Cancer screening Lung Cancer  Lung cancer screening is recommended for adults 72-36 years old who are at high risk for lung cancer because of a history of smoking.  A yearly low-dose CT scan of the lungs is recommended for people who:  Currently smoke.  Have quit within the past 15 years.  Have at least a 30-pack-year history of smoking. A pack year is smoking an average of one pack of cigarettes a day for 1 year.  Yearly screening should continue until it has been 15 years since you quit.  Yearly screening should stop if you develop a health problem that would prevent you from having lung cancer treatment. Breast Cancer  Practice breast self-awareness. This means understanding how your breasts normally appear and feel.  It also means doing regular breast self-exams. Let your health care provider know about  any changes, no matter how small.  If you are in your 20s or 30s, you should have a clinical breast exam (CBE) by a health care provider every 1-3 years as part of a regular health exam.  If you are 37 or older, have a CBE every year. Also consider having a breast X-ray (mammogram) every year.  If you have a family history of breast cancer, talk to your health care provider about genetic screening.  If you are at high risk for breast cancer, talk to your health care provider about having an MRI and a mammogram every year.  Breast cancer gene (BRCA) assessment is recommended for women who have family members with BRCA-related cancers. BRCA-related cancers include:  Breast.  Ovarian.  Tubal.  Peritoneal cancers.  Results of the assessment will determine the need for genetic counseling and BRCA1 and BRCA2 testing. Cervical Cancer  Your health care provider may recommend that you be screened regularly for cancer of the pelvic organs (ovaries, uterus, and vagina). This screening involves a pelvic examination, including checking for microscopic changes to the surface of your cervix (Pap test). You may be encouraged to have this screening done every 3 years, beginning at age 95.  For women ages 42-65, health care providers may recommend pelvic exams and Pap testing every 3 years, or they may recommend the Pap and pelvic exam, combined with testing for human papilloma virus (HPV), every 5 years. Some types of HPV increase your risk of cervical cancer. Testing for HPV may also be done on women of any age with unclear Pap test results.  Other health care providers may not recommend any screening for nonpregnant women who are considered low risk for pelvic cancer and who do not have symptoms. Ask your health care provider if a screening pelvic exam is right for you.  If you have had past treatment for cervical cancer or a condition that could lead to cancer, you need Pap tests and screening for  cancer for at least 20 years after your treatment. If Pap  tests have been discontinued, your risk factors (such as having a new sexual partner) need to be reassessed to determine if screening should resume. Some women have medical problems that increase the chance of getting cervical cancer. In these cases, your health care provider may recommend more frequent screening and Pap tests. Colorectal Cancer  This type of cancer can be detected and often prevented.  Routine colorectal cancer screening usually begins at 81 years of age and continues through 81 years of age.  Your health care provider may recommend screening at an earlier age if you have risk factors for colon cancer.  Your health care provider may also recommend using home test kits to check for hidden blood in the stool.  A small camera at the end of a tube can be used to examine your colon directly (sigmoidoscopy or colonoscopy). This is done to check for the earliest forms of colorectal cancer.  Routine screening usually begins at age 65.  Direct examination of the colon should be repeated every 5-10 years through 81 years of age. However, you may need to be screened more often if early forms of precancerous polyps or small growths are found. Skin Cancer  Check your skin from head to toe regularly.  Tell your health care provider about any new moles or changes in moles, especially if there is a change in a mole's shape or color.  Also tell your health care provider if you have a mole that is larger than the size of a pencil eraser.  Always use sunscreen. Apply sunscreen liberally and repeatedly throughout the day.  Protect yourself by wearing long sleeves, pants, a wide-brimmed hat, and sunglasses whenever you are outside. Heart disease, diabetes, and high blood pressure  High blood pressure causes heart disease and increases the risk of stroke. High blood pressure is more likely to develop in:  People who have blood  pressure in the high end of the normal range (130-139/85-89 mm Hg).  People who are overweight or obese.  People who are African American.  If you are 61-98 years of age, have your blood pressure checked every 3-5 years. If you are 70 years of age or older, have your blood pressure checked every year. You should have your blood pressure measured twice-once when you are at a hospital or clinic, and once when you are not at a hospital or clinic. Record the average of the two measurements. To check your blood pressure when you are not at a hospital or clinic, you can use:  An automated blood pressure machine at a pharmacy.  A home blood pressure monitor.  If you are between 68 years and 46 years old, ask your health care provider if you should take aspirin to prevent strokes.  Have regular diabetes screenings. This involves taking a blood sample to check your fasting blood sugar level.  If you are at a normal weight and have a low risk for diabetes, have this test once every three years after 81 years of age.  If you are overweight and have a high risk for diabetes, consider being tested at a younger age or more often. Preventing infection Hepatitis B  If you have a higher risk for hepatitis B, you should be screened for this virus. You are considered at high risk for hepatitis B if:  You were born in a country where hepatitis B is common. Ask your health care provider which countries are considered high risk.  Your parents were born in a  high-risk country, and you have not been immunized against hepatitis B (hepatitis B vaccine).  You have HIV or AIDS.  You use needles to inject street drugs.  You live with someone who has hepatitis B.  You have had sex with someone who has hepatitis B.  You get hemodialysis treatment.  You take certain medicines for conditions, including cancer, organ transplantation, and autoimmune conditions. Hepatitis C  Blood testing is recommended  for:  Everyone born from [birth year] through 1963.  Anyone with known risk factors for hepatitis C. Sexually transmitted infections (STIs)  You should be screened for sexually transmitted infections (STIs) including gonorrhea and chlamydia if:  You are sexually active and are younger than 81 years of age.  You are older than 81 years of age and your health care provider tells you that you are at risk for this type of infection.  Your sexual activity has changed since you were last screened and you are at an increased risk for chlamydia or gonorrhea. Ask your health care provider if you are at risk.  If you do not have HIV, but are at risk, it may be recommended that you take a prescription medicine daily to prevent HIV infection. This is called pre-exposure prophylaxis (PrEP). You are considered at risk if:  You are sexually active and do not regularly use condoms or know the HIV status of your partner(s).  You take drugs by injection.  You are sexually active with a partner who has HIV. Talk with your health care provider about whether you are at high risk of being infected with HIV. If you choose to begin PrEP, you should first be tested for HIV. You should then be tested every 3 months for as long as you are taking PrEP. Pregnancy  If you are premenopausal and you may become pregnant, ask your health care provider about preconception counseling.  If you may become pregnant, take 400 to 800 micrograms (mcg) of folic acid every day.  If you want to prevent pregnancy, talk to your health care provider about birth control (contraception). Osteoporosis and menopause  Osteoporosis is a disease in which the bones lose minerals and strength with aging. This can result in serious bone fractures. Your risk for osteoporosis can be identified using a bone density scan.  If you are 75 years of age or older, or if you are at risk for osteoporosis and fractures, ask your health care provider if you  should be screened.  Ask your health care provider whether you should take a calcium or vitamin D supplement to lower your risk for osteoporosis.  Menopause may have certain physical symptoms and risks.  Hormone replacement therapy may reduce some of these symptoms and risks. Talk to your health care provider about whether hormone replacement therapy is right for you. Follow these instructions at home:  Schedule regular health, dental, and eye exams.  Stay current with your immunizations.  Do not use any tobacco products including cigarettes, chewing tobacco, or electronic cigarettes.  If you are pregnant, do not drink alcohol.  If you are breastfeeding, limit how much and how often you drink alcohol.  Limit alcohol intake to no more than 1 drink per day for nonpregnant women. One drink equals 12 ounces of beer, 5 ounces of wine, or 1 ounces of hard liquor.  Do not use street drugs.  Do not share needles.  Ask your health care provider for help if you need support or information about quitting drugs.  Tell  your health care provider if you often feel depressed.  Tell your health care provider if you have ever been abused or do not feel safe at home. This information is not intended to replace advice given to you by your health care provider. Make sure you discuss any questions you have with your health care provider. Document Released: 09/18/2010 Document Revised: 08/11/2015 Document Reviewed: 12/07/2014  2017 [employer]

## 2016-04-30 NOTE — Telephone Encounter (Signed)
Patient calling back to let Maudie Mercury know that she does not need a new cardiologist until December.

## 2016-05-03 ENCOUNTER — Encounter: Payer: Self-pay | Admitting: Family Medicine

## 2016-05-04 ENCOUNTER — Encounter: Payer: Self-pay | Admitting: Family Medicine

## 2016-05-29 ENCOUNTER — Ambulatory Visit (INDEPENDENT_AMBULATORY_CARE_PROVIDER_SITE_OTHER): Payer: Medicare Other | Admitting: Podiatry

## 2016-05-29 ENCOUNTER — Encounter: Payer: Self-pay | Admitting: Podiatry

## 2016-05-29 DIAGNOSIS — M2041 Other hammer toe(s) (acquired), right foot: Secondary | ICD-10-CM | POA: Diagnosis not present

## 2016-05-29 DIAGNOSIS — M2010 Hallux valgus (acquired), unspecified foot: Secondary | ICD-10-CM

## 2016-05-29 DIAGNOSIS — M2042 Other hammer toe(s) (acquired), left foot: Secondary | ICD-10-CM

## 2016-05-29 DIAGNOSIS — L84 Corns and callosities: Secondary | ICD-10-CM

## 2016-05-29 NOTE — Progress Notes (Signed)
   Subjective:    Patient ID: Jennifer Peck, female    DOB: 11/14/1922, 83 y.o.   MRN: [id number]  HPI  83 year old female presents the office today for concerns of corns and calluses to her feet which is been ongoing for several years. For the corns in between her toe she does use pads which help. She denies any surrounding redness, drainage. The lesions hurt with pressure. No redness, drainage or any signs of infection that she recalls. No other complaints at this time.    Review of Systems  All other systems reviewed and are negative.      Objective:   Physical Exam General: AAO x3, NAD  Dermatological: hyperkeratotic lesions on the right lateral second toe in medial third toe. Lesion also left submetatarsal 5. Upon debridement no underlying ulceration, drainage or any signs of infection. No other open lesions or pre-ulcerative lesions are identified today.  Vascular: Dorsalis Pedis artery and Posterior Tibial artery pedal pulses are 2/4 bilateral with immedate capillary fill time.  There is no pain with calf compression, swelling, warmth, erythema.   Neruologic: Grossly intact via light touch bilateral. Vibratory intact via tuning fork bilateral. Protective threshold with Semmes Wienstein monofilament intact to all pedal sites bilateral.   Musculoskeletal: hammertoes are present as well as HAV. Muscular strength 5/5 in all groups tested bilateral.      Assessment & Plan:  83 year old female with symptomatic hyperkertotic lesions.  -Treatment options discussed including all alternatives, risks, and complications -Etiology of symptoms were discussed -Hyperkeratotic lesions sharply debrided x 3 without complications or bleeding.  -Offloading pads dispensed.  -Daily foot inspection.  -RTC in 3 months or sooner if needed.  Celesta Gentile, DPM

## 2016-07-15 ENCOUNTER — Other Ambulatory Visit: Payer: Self-pay | Admitting: Family Medicine

## 2016-07-30 ENCOUNTER — Encounter: Payer: Self-pay | Admitting: Family Medicine

## 2016-07-30 ENCOUNTER — Ambulatory Visit (INDEPENDENT_AMBULATORY_CARE_PROVIDER_SITE_OTHER): Payer: Medicare Other | Admitting: Family Medicine

## 2016-07-30 VITALS — BP 119/65 | HR 69 | Temp 98.0°F | Resp 16 | Ht 60.0 in | Wt 136.8 lb

## 2016-07-30 DIAGNOSIS — E7800 Pure hypercholesterolemia, unspecified: Secondary | ICD-10-CM

## 2016-07-30 DIAGNOSIS — I10 Essential (primary) hypertension: Secondary | ICD-10-CM | POA: Diagnosis not present

## 2016-07-30 DIAGNOSIS — M19071 Primary osteoarthritis, right ankle and foot: Secondary | ICD-10-CM | POA: Diagnosis not present

## 2016-07-30 DIAGNOSIS — W57XXXA Bitten or stung by nonvenomous insect and other nonvenomous arthropods, initial encounter: Secondary | ICD-10-CM

## 2016-07-30 DIAGNOSIS — I2510 Atherosclerotic heart disease of native coronary artery without angina pectoris: Secondary | ICD-10-CM

## 2016-07-30 DIAGNOSIS — M19072 Primary osteoarthritis, left ankle and foot: Secondary | ICD-10-CM

## 2016-07-30 LAB — COMPREHENSIVE METABOLIC PANEL
ALT: 13 U/L (ref 0–35)
AST: 10 U/L (ref 0–37)
Albumin: 4.6 g/dL (ref 3.5–5.2)
Alkaline Phosphatase: 52 U/L (ref 39–117)
BUN: 15 mg/dL (ref 6–23)
CALCIUM: 9.7 mg/dL (ref 8.4–10.5)
CHLORIDE: 100 meq/L (ref 96–112)
CO2: 27 mEq/L (ref 19–32)
Creatinine, Ser: 0.78 mg/dL (ref 0.40–1.20)
GFR: 73.46 mL/min (ref >60.00)
Glucose, Bld: 95 mg/dL (ref 70–99)
POTASSIUM: 4.7 meq/L (ref 3.5–5.1)
SODIUM: 135 meq/L (ref 135–145)
Total Bilirubin: 0.6 mg/dL (ref 0.2–1.2)
Total Protein: 7.1 g/dL (ref 6.0–8.3)

## 2016-07-30 LAB — LIPID PANEL
CHOLESTEROL: 256 mg/dL — AB (ref 0–200)
HDL: 71.4 mg/dL (ref >39.00)
LDL CALC: 166 mg/dL — AB (ref 0–99)
NonHDL: 184.23
TRIGLYCERIDES: 93 mg/dL (ref 0.0–149.0)
Total CHOL/HDL Ratio: 4
VLDL: 18.6 mg/dL (ref 0.0–40.0)

## 2016-07-30 NOTE — Progress Notes (Signed)
OFFICE VISIT  07/30/2016   CC:  Chief Complaint  Patient presents with  . Follow-up    pt is fasting.    HPI:    Patient is a 83 y.o. Caucasian female who presents for 6 mo f/u HTN, HLD, CAD.  Home bp monitoring occas 120s/70.  No dizziness and no low bp.  CAD: no CP, no SOB/dyspnea.  HLD: intolerant of statins and zetia.  Pt was asked to consider PCSK9 inhibitor but pt does not want to do this.  FEET: chronic arthritic feet pain.  Sees podiatrist.  Needs a comfortable shoe so she can garden and not be afraid of falling.  ROS: tick bite 07/26/16 to distal left thigh--no problems noted at the site, no signs of illness.  She just wanted it noted in chart.  Past Medical History:  Diagnosis Date  . Allergic state 04/30/2011  . Anxiety   . BCC (basal cell carcinoma), face 11/06/2010  . Bilateral bunions 11/06/2010  . Constipation 11/06/2010  . Coronary artery disease 2011   BMS x 3  . GERD (gastroesophageal reflux disease) 05/11/2012  . History of hyperkalemia 01/23/2011  . Hyperlipidemia    Intolerant of statins and zetia.  Started metamucil trial 09/2014  . Hypertension   . Lesion of labia 02/04/2012  . Osteoarthritis of both knees    R>L  . Osteopenia 11/23/2010; 02/2016   2017 T-score -1.9.  Repeat DEXA 2 yrs.  . Peripheral neuropathy 11/06/2010   Small fiber, non-diabetic per neurologist  . Right lumbar radiculopathy 01/2015   Improved with PT  . SCC (squamous cell carcinoma), arm 11/06/2010  . Seborrheic keratosis 04/30/2011  . Vertigo     Past Surgical History:  Procedure Laterality Date  . CARDIAC CATHETERIZATION  12/20/2009   3 bare metal stents  . DEXA  03/05/2016   T-score -1.9  . EYE SURGERY     cataract- left eye 09-24-11  . SKIN BIOPSY  08/2012   Shave excision of lesion on pt's back: irritated seb keratosis.  . TRANSTHORACIC ECHOCARDIOGRAM  12/05/2009   Mild LVH, normal systolic fxn, mild impaired relaxation.  No signif valvular dz.  . TUBAL LIGATION       Outpatient Medications Prior to Visit  Medication Sig Dispense Refill  . ALPRAZolam (XANAX) 0.25 MG tablet Take 1 tablet (0.25 mg total) by mouth daily as needed. Panic attack 30 tablet 1  . amLODipine (NORVASC) 2.5 MG tablet TAKE ONE TABLET BY MOUTH ONCE DAILY 180 tablet 0  . aspirin 81 MG tablet Take 81 mg by mouth daily.    Marland Kitchen azelastine (OPTIVAR) 0.05 % ophthalmic solution INSTILL TWO DROPS INTO EACH EYE TWICE DAILY 6 mL 12  . Benfotiamine 150 MG CAPS Take 1 capsule by mouth daily. For numbness in feet    . Cyanocobalamin (VITAMIN B-12 CR PO) Take 1 tablet by mouth daily.     . DiphenhydrAMINE HCl (BENADRYL ALLERGY PO) Take 1 tablet by mouth as needed (allergies).     . enalapril (VASOTEC) 20 MG tablet TAKE ONE TABLET BY MOUTH IN THE MORNING AND ONE-HALF AT BEDTIME 135 tablet 1  . magnesium oxide (MAG-OX) 400 MG tablet Take 400 mg by mouth once a week.     . metoprolol succinate (TOPROL-XL) 25 MG 24 hr tablet Take 1 tablet (25 mg total) by mouth daily. 90 tablet 3  . Multiple Vitamin (MULTIVITAMIN) tablet Take 1 tablet by mouth daily. Takes occ    . Omega-3 Fatty Acids (FISH OIL PO)  Take 1 capsule by mouth daily.     . fluticasone (KLS ALLER-FLO) 50 MCG/ACT nasal spray Place into both nostrils daily.    Marland Kitchen Ketotifen Fumarate (ALLERGY EYE DROPS OP) Place 2 drops into both eyes as needed. Red itchy eyes      No facility-administered medications prior to visit.     Allergies  Allergen Reactions  . Chlorthalidone     hyponatremia  . Atenolol     Caused fatigue   . Besivance [Besifloxacin Hcl]     rash  . Codeine Nausea And Vomiting  . Crestor [Rosuvastatin]     "leg gave away"  . Gabapentin     Memory loss and confusion  . Lipitor [Atorvastatin] Other (See Comments)    Severe leg pain  . Lovastatin     myalgia  . Simvastatin     Her leg gave away on her and states thinks secondary to Simvastatin  . Vigamox [Moxifloxacin]     rash  . Welchol [Colesevelam Hcl]      ROS As per HPI  PE: Blood pressure 119/65, pulse 69, temperature 98 F (36.7 C), temperature source Oral, resp. rate 16, height 5' (1.524 m), weight 136 lb 12 oz (62 kg), SpO2 96 %. Gen: Alert, well appearing.  Patient is oriented to person, place, time, and situation. AFFECT: pleasant, lucid thought and speech.. CV: RRR, no m/r/g.   LUNGS: CTA bilat, nonlabored resps, good aeration in all lung fields. EXT: no clubbing or cyanosis.  She has trace pitting in LLE, 1+ pitting in RLE. SKIN: small pink papule distal, lateral left thigh--no erythema, tenderness, or fluctuance. FEET: no bruising or erythema or edema.  Hammer toe deformities + hallux valgus deformity bilat. High arches bilat. LABS:  Lab Results  Component Value Date   TSH 1.36 05/07/2012   Lab Results  Component Value Date   WBC 5.0 05/02/2015   HGB 14.2 05/02/2015   HCT 42.1 05/02/2015   MCV 94.4 05/02/2015   PLT 172 05/02/2015   Lab Results  Component Value Date   CREATININE 0.74 02/08/2016   BUN 15 02/08/2016   NA 136 02/08/2016   K 5.0 02/08/2016   CL 99 02/08/2016   CO2 26 02/08/2016   Lab Results  Component Value Date   ALT 22 05/02/2015   AST 21 05/02/2015   ALKPHOS 54 05/02/2015   BILITOT 0.4 05/02/2015   Lab Results  Component Value Date   CHOL 222 (H) 02/01/2015   Lab Results  Component Value Date   HDL 72.50 02/01/2015   Lab Results  Component Value Date   LDLCALC 138 (H) 02/01/2015   Lab Results  Component Value Date   TRIG 57.0 02/01/2015   Lab Results  Component Value Date   CHOLHDL 3 02/01/2015   Lab Results  Component Value Date   HGBA1C  09/03/2009    5.4 (NOTE)                                                                       According to the ADA Clinical Practice Recommendations for 2011, when HbA1c is used as a screening test:   >=6.5%   Diagnostic of Diabetes Mellitus           (  if abnormal result  is confirmed)  5.7-6.4%   Increased risk of developing  Diabetes Mellitus  References:Diagnosis and Classification of Diabetes Mellitus,Diabetes ZOXW,9604,54(UJWJX 1):S62-S69 and Standards of Medical Care in         Diabetes - 2011,Diabetes BJYN,8295,62  (Suppl 1):S11-S61.    IMPRESSION AND PLAN:  1) HTN: The current medical regimen is effective;  continue present plan and medications. Lytes/cr today.  2) HLD: intol of statins and zetia.  Pt does NOT want to take PCSK9 inhibitor. She will tell her cardiologist this at next f/u visit. Check FLP and AST/ALT today.  3) CAD: Asymptomatic.  Continue beta blocker + ACE-I + ASA.  See #2 above for statin info.  4) Tick bite: normal, mild inflammatory rxn.  No sign of illness. Signs/symptoms to call or return for were reviewed and pt expressed understanding.  5) Chronic bilat osteoarthritic feet pain: continue with podiatrist and I gave her info on [employer] store in [place] to investigate custom footwear options.  An After Visit Summary was printed and given to the patient.  FOLLOW UP: Return in about 6 months (around 01/30/2017) for routine chronic illness f/u.  Signed:  Crissie Sickles, MD           07/30/2016

## 2016-08-17 DIAGNOSIS — D039 Melanoma in situ, unspecified: Secondary | ICD-10-CM

## 2016-08-17 HISTORY — DX: Melanoma in situ, unspecified: D03.9

## 2016-09-11 ENCOUNTER — Encounter: Payer: Self-pay | Admitting: Family Medicine

## 2016-10-07 ENCOUNTER — Other Ambulatory Visit: Payer: Self-pay | Admitting: Family Medicine

## 2016-10-17 ENCOUNTER — Ambulatory Visit (INDEPENDENT_AMBULATORY_CARE_PROVIDER_SITE_OTHER): Payer: Medicare Other | Admitting: Family Medicine

## 2016-10-17 ENCOUNTER — Encounter: Payer: Self-pay | Admitting: Family Medicine

## 2016-10-17 VITALS — BP 149/79 | HR 66 | Temp 98.7°F | Resp 16 | Ht 60.0 in | Wt 134.5 lb

## 2016-10-17 DIAGNOSIS — I2510 Atherosclerotic heart disease of native coronary artery without angina pectoris: Secondary | ICD-10-CM | POA: Diagnosis not present

## 2016-10-17 DIAGNOSIS — I8390 Asymptomatic varicose veins of unspecified lower extremity: Secondary | ICD-10-CM | POA: Diagnosis not present

## 2016-10-17 DIAGNOSIS — I83899 Varicose veins of unspecified lower extremities with other complications: Secondary | ICD-10-CM

## 2016-10-17 MED ORDER — MUPIROCIN 2 % EX OINT: TOPICAL_OINTMENT | CUTANEOUS | 0 refills | Status: DC

## 2016-10-17 NOTE — Progress Notes (Signed)
OFFICE VISIT  10/17/2016   CC:  Chief Complaint  Patient presents with  . Sore on lower leg   HPI:    Patient is a 83 y.o. Caucasian female who presents for "sore" on left lower leg that she noted 2 d/a.  Recalls no trauma.  No pain.  The area was bleeding when she first noted it. Remainder of skin w/out any similar problem.  Has some mild varicosities on both LL's, with mild chronic ankle swelling---no acute changes recently.  No recent f/c/malaise.  Past Medical History:  Diagnosis Date  . Allergic state 04/30/2011  . Anxiety   . BCC (basal cell carcinoma), face 11/06/2010  . Bilateral bunions 11/06/2010  . Constipation 11/06/2010  . Coronary artery disease 2011   BMS x 3  . GERD (gastroesophageal reflux disease) 05/11/2012  . History of hyperkalemia 01/23/2011  . Hyperlipidemia    Intolerant of statins and zetia.  Refuses to consider PCSK9 inhibitor.  . Hypertension   . Lesion of labia 02/04/2012  . Melanoma in situ [facility]) 08/2016   Derm= Dr. Renda Rolls @ [facility] in [place].  . Osteoarthritis of both knees    R>L  . Osteopenia 11/23/2010; 02/2016   2017 T-score -1.9.  Repeat DEXA 2 yrs.  . Peripheral neuropathy 11/06/2010   Small fiber, non-diabetic per neurologist  . Right lumbar radiculopathy 01/2015   Improved with PT  . SCC (squamous cell carcinoma), arm 11/06/2010  . Seborrheic keratosis 04/30/2011  . Vertigo     Past Surgical History:  Procedure Laterality Date  . CARDIAC CATHETERIZATION  12/20/2009   3 bare metal stents  . DEXA  03/05/2016   T-score -1.9  . EYE SURGERY     cataract- left eye 09-24-11  . SKIN BIOPSY  08/2012   Shave excision of lesion on pt's back: irritated seb keratosis.  . TRANSTHORACIC ECHOCARDIOGRAM  12/05/2009   Mild LVH, normal systolic fxn, mild impaired relaxation.  No signif valvular dz.  . TUBAL LIGATION      Outpatient Medications Prior to Visit  Medication Sig Dispense Refill  . ALPRAZolam (XANAX) 0.25 MG tablet Take 1  tablet (0.25 mg total) by mouth daily as needed. Panic attack 30 tablet 1  . amLODipine (NORVASC) 2.5 MG tablet TAKE ONE TABLET BY MOUTH ONCE DAILY 180 tablet 1  . aspirin 81 MG tablet Take 81 mg by mouth daily.    Marland Kitchen azelastine (OPTIVAR) 0.05 % ophthalmic solution INSTILL TWO DROPS INTO EACH EYE TWICE DAILY 6 mL 12  . Benfotiamine 150 MG CAPS Take 1 capsule by mouth daily. For numbness in feet    . Cyanocobalamin (VITAMIN B-12 CR PO) Take 1 tablet by mouth daily.     . DiphenhydrAMINE HCl (BENADRYL ALLERGY PO) Take 1 tablet by mouth as needed (allergies).     . enalapril (VASOTEC) 20 MG tablet TAKE ONE TABLET BY MOUTH IN THE MORNING AND ONE-HALF AT BEDTIME 135 tablet 1  . magnesium oxide (MAG-OX) 400 MG tablet Take 400 mg by mouth once a week.     . metoprolol succinate (TOPROL-XL) 25 MG 24 hr tablet Take 1 tablet (25 mg total) by mouth daily. 90 tablet 3  . Multiple Vitamin (MULTIVITAMIN) tablet Take 1 tablet by mouth daily. Takes occ    . Omega-3 Fatty Acids (FISH OIL PO) Take 1 capsule by mouth daily.      No facility-administered medications prior to visit.     Allergies  Allergen Reactions  . Chlorthalidone  hyponatremia  . Atenolol     Caused fatigue   . Besivance [Besifloxacin Hcl]     rash  . Codeine Nausea And Vomiting  . Crestor [Rosuvastatin]     "leg gave away"  . Gabapentin     Memory loss and confusion  . Lipitor [Atorvastatin] Other (See Comments)    Severe leg pain  . Lovastatin     myalgia  . Simvastatin     Her leg gave away on her and states thinks secondary to Simvastatin  . Vigamox [Moxifloxacin]     rash  . Welchol [Colesevelam Hcl]     ROS As per HPI  PE: Blood pressure (!) 149/79, pulse 66, temperature 98.7 F (37.1 C), temperature source Oral, resp. rate 16, height 5' (1.524 m), weight 134 lb 8 oz (61 kg), SpO2 96 %. Gen: Alert, well appearing.  Patient is oriented to person, place, time, and situation. AFFECT: pleasant, lucid thought and  speech. Lower legs: trace to 1+ pitting edema of LL's from mid tibia into ankles bilat, with some scattered small superficial varicosities that appear non-inflamed.  Some scattered hemosiderin skin changes are present on both LL's. On medial aspect of L lower leg there is an oval, superficial ulceration that is dry/healing, with slight ecchymoses at the borders and a small scab present.  No bleeding, no maceration, no fluctuance or streaking.  No tenderness or erythema.    LABS:  Lab Results  Component Value Date   WBC 5.0 05/02/2015   HGB 14.2 05/02/2015   HCT 42.1 05/02/2015   MCV 94.4 05/02/2015   PLT 172 05/02/2015     Chemistry      Component Value Date/Time   NA 135 07/30/2016 1054   K 4.7 07/30/2016 1054   CL 100 07/30/2016 1054   CO2 27 07/30/2016 1054   BUN 15 07/30/2016 1054   CREATININE 0.78 07/30/2016 1054   CREATININE 0.78 06/06/2010 1745      Component Value Date/Time   CALCIUM 9.7 07/30/2016 1054   ALKPHOS 52 07/30/2016 1054   AST 10 07/30/2016 1054   ALT 13 07/30/2016 1054   BILITOT 0.6 07/30/2016 1054     Lab Results  Component Value Date   HGBA1C  09/03/2009    5.4 (NOTE)                                                                       According to the ADA Clinical Practice Recommendations for 2011, when HbA1c is used as a screening test:   >=6.5%   Diagnostic of Diabetes Mellitus           (if abnormal result  is confirmed)  5.7-6.4%   Increased risk of developing Diabetes Mellitus  References:Diagnosis and Classification of Diabetes Mellitus,Diabetes MGNO,0370,48(GQBVQ 1):S62-S69 and Standards of Medical Care in         Diabetes - 2011,Diabetes Care,2011,34  (Suppl 1):S11-S61.   IMPRESSION AND PLAN:  Left lower leg with small ruptured varicosity. No open wound is present.  This is healing appropriately. Rx'd bactroban ointment for pt to apply tid until it heals completely. Signs/symptoms to call or return for were reviewed and pt expressed  understanding.  An After Visit Summary was printed and given to  the patient.  FOLLOW UP: Return if symptoms worsen or fail to improve.  Signed:  Crissie Sickles, MD           10/17/2016

## 2016-10-19 ENCOUNTER — Ambulatory Visit: Payer: Medicare Other | Admitting: Family Medicine

## 2016-10-29 IMAGING — CT CT HEAD W/O CM
2 series · 15 of 30 positions shown, 17 images · non-contrast
Comparison: 09/02/2009

CLINICAL DATA: Confusion. Feels like her heart is racing. History
of anxiety.

EXAM:
CT HEAD WITHOUT CONTRAST
TECHNIQUE: Contiguous axial images were obtained from the base of the skull
through the vertex without intravenous contrast.

[Series 2: head without · axial · non-contrast · 0.43mm/px · z∈[-127,-7]mm · 7 of 33 slices shown, 9 images]
[im 5/33  brain]
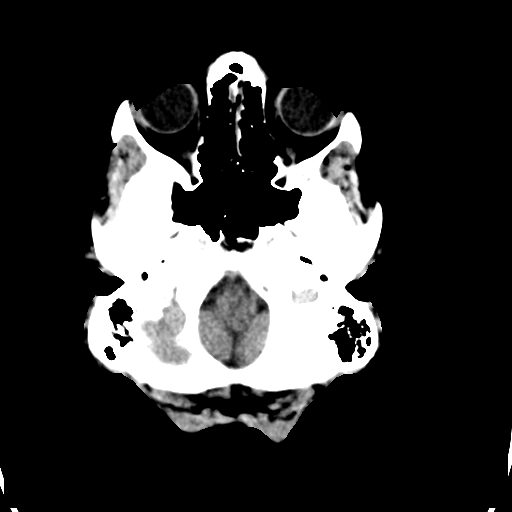
[im 5/33  bone]
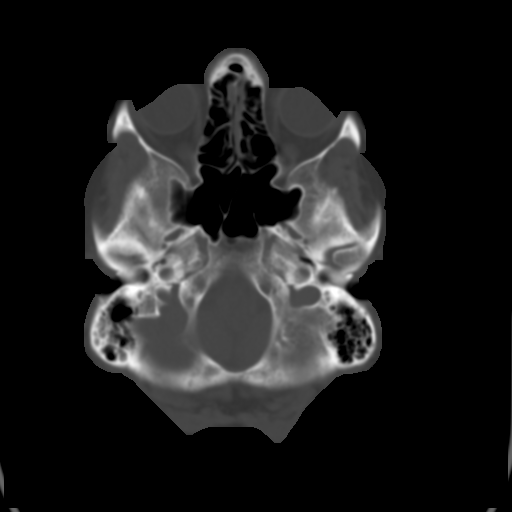
[im 9/33  brain]
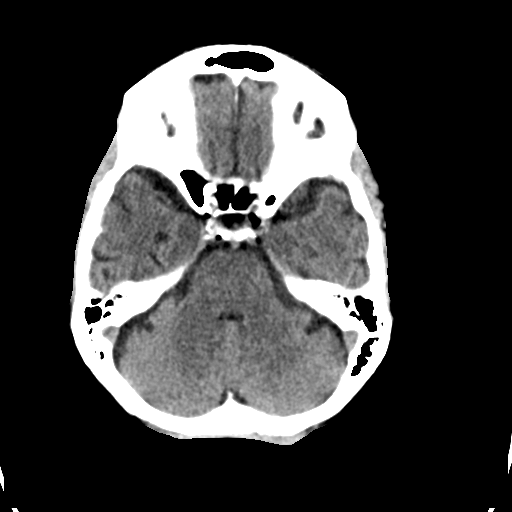
[im 13/33  brain]
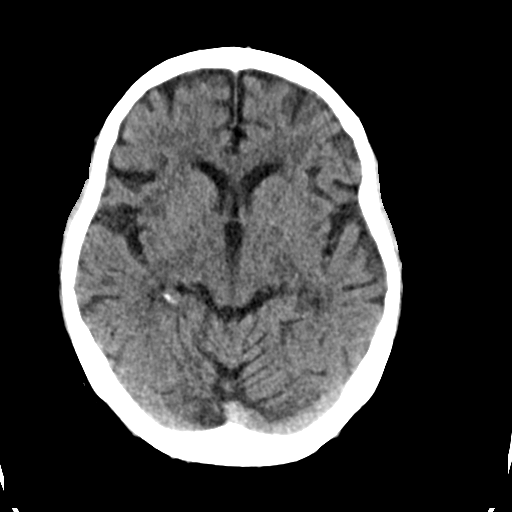
[im 17/33  brain]
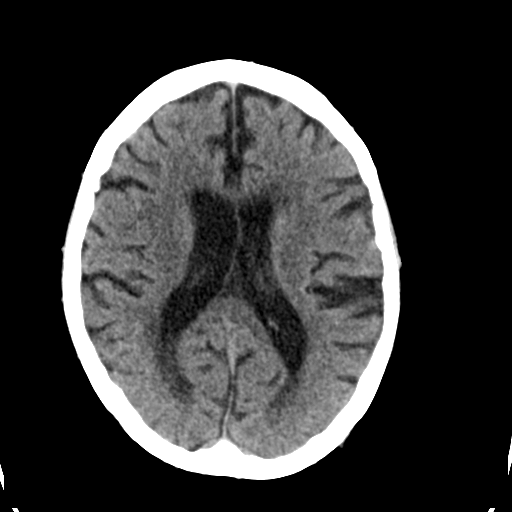
[im 21/33  brain]
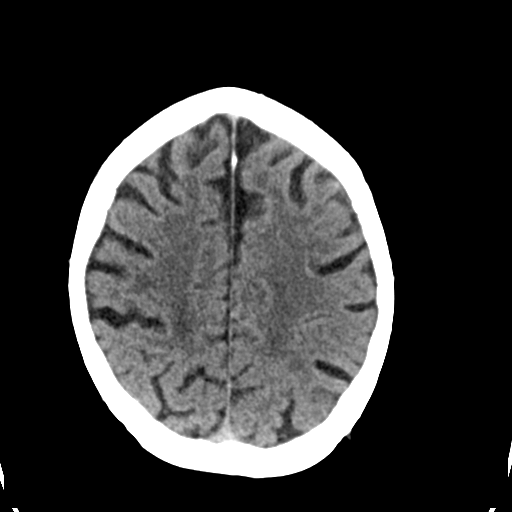
[im 21/33  bone]
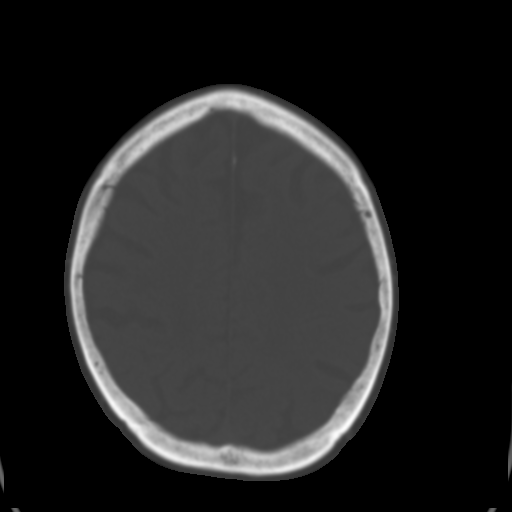
[im 25/33  brain]
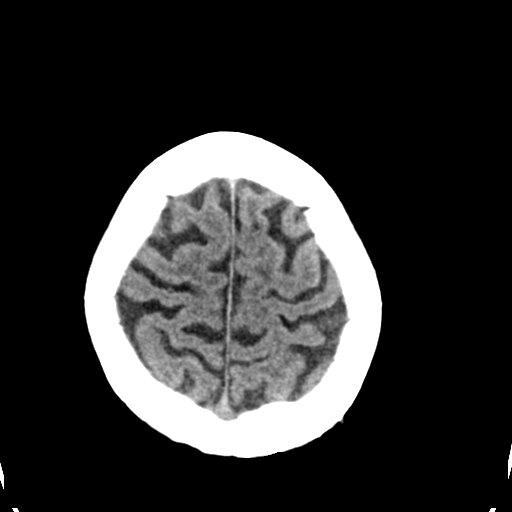
[im 29/33  brain]
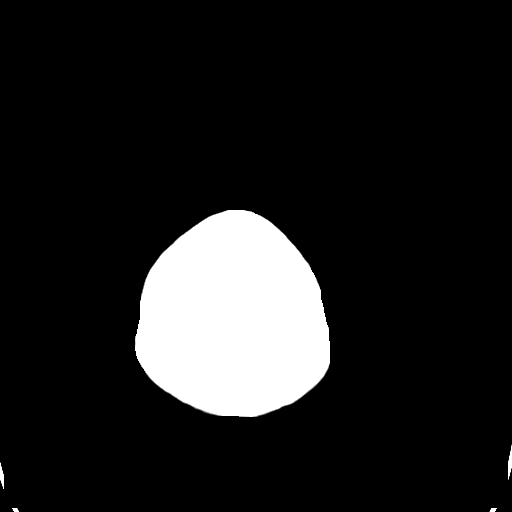

[Series 3: head bone · axial · 0.43mm/px · z∈[-131,-3]mm · 8 of 81 slices shown]
[im 9/81  bone]
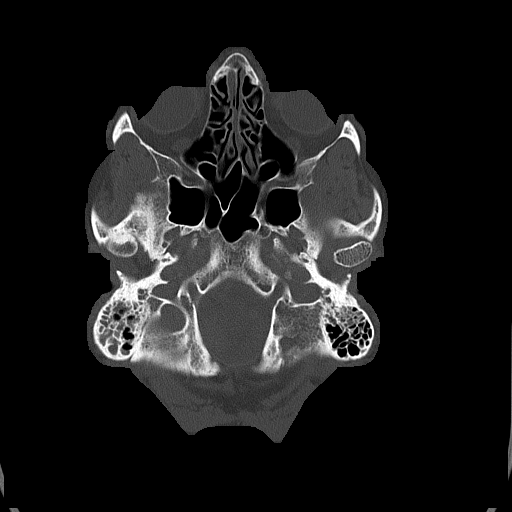
[im 17/81  bone]
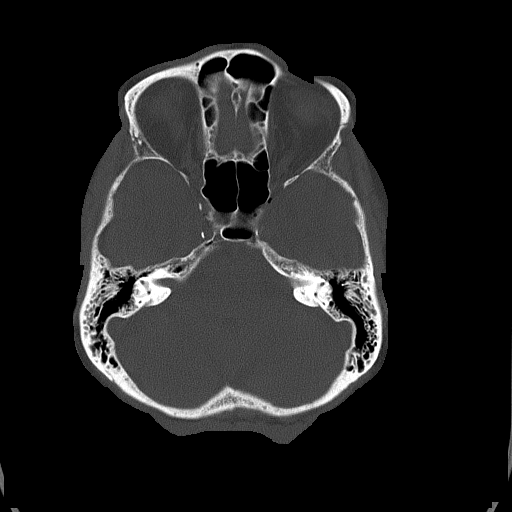
[im 25/81  bone]
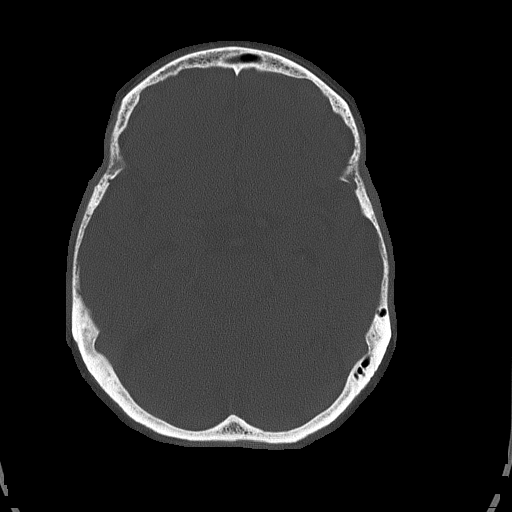
[im 37/81  bone]
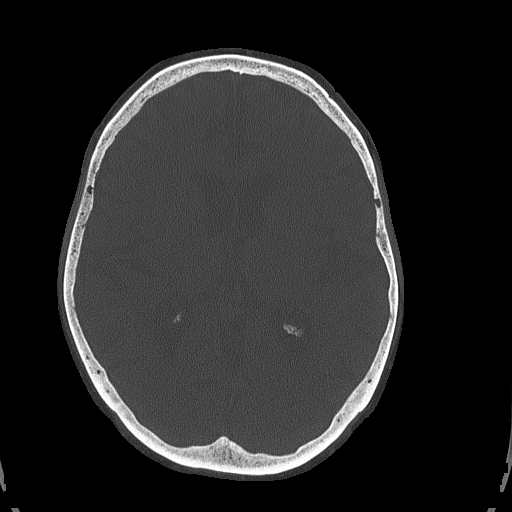
[im 45/81  bone]
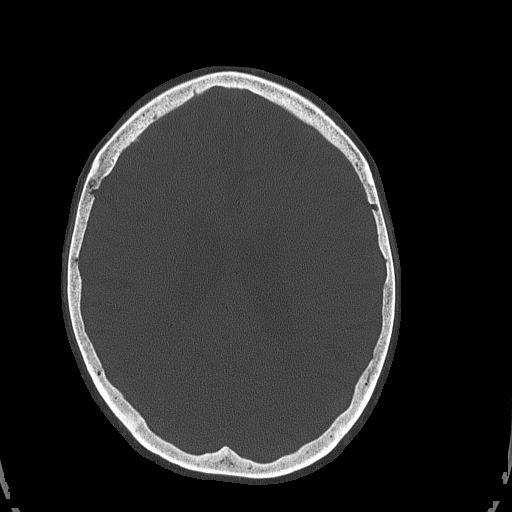
[im 57/81  bone]
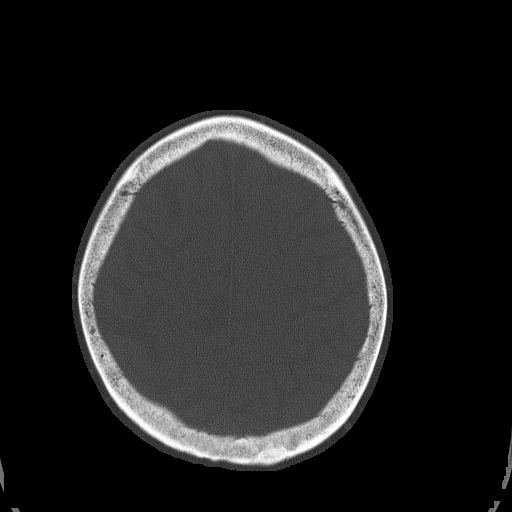
[im 65/81  bone]
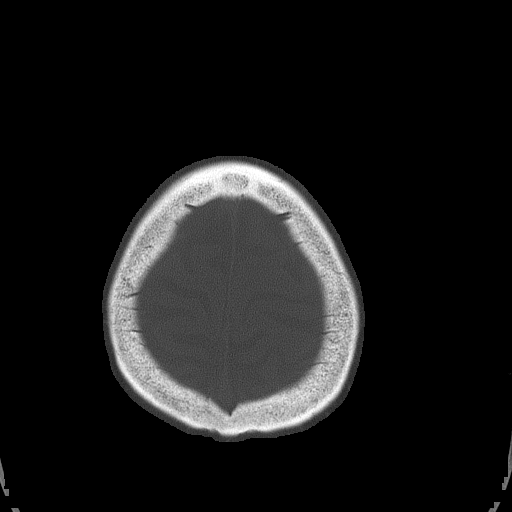
[im 73/81  bone]
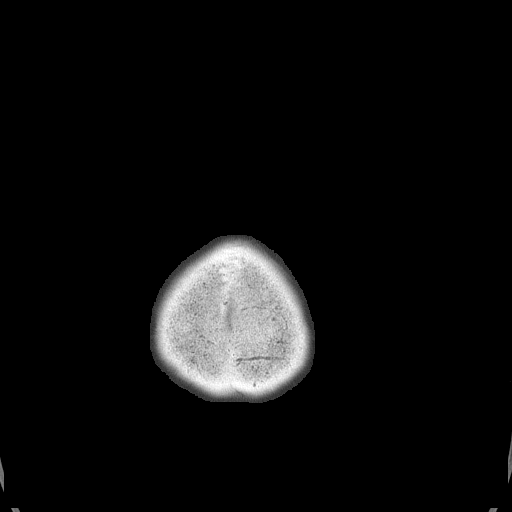

[15 of 30 positions shown; findings below may reference images not displayed]

FINDINGS: Diffuse cerebral atrophy. Ventricular dilatation consistent with
central atrophy. Low-attenuation changes in the deep white matter
consistent with small vessel ischemia. No mass effect or midline
shift. No abnormal extra-axial fluid collections. Gray-white matter
junctions are distinct. Basal cisterns are not effaced. No evidence
of acute intracranial hemorrhage. No depressed skull fractures.
Opacification of some of the mastoid air cells bilaterally.
Paranasal sinuses are clear. Vascular calcifications.
IMPRESSION: No acute intracranial abnormalities. Chronic atrophy and small
vessel ischemic changes. Bilateral mastoid effusions.

## 2017-01-02 ENCOUNTER — Ambulatory Visit (INDEPENDENT_AMBULATORY_CARE_PROVIDER_SITE_OTHER): Payer: Medicare Other

## 2017-01-02 DIAGNOSIS — Z23 Encounter for immunization: Secondary | ICD-10-CM | POA: Diagnosis not present

## 2017-01-10 ENCOUNTER — Other Ambulatory Visit: Payer: Self-pay | Admitting: Family Medicine

## 2017-01-30 ENCOUNTER — Encounter: Payer: Self-pay | Admitting: *Deleted

## 2017-01-30 ENCOUNTER — Encounter: Payer: Self-pay | Admitting: Family Medicine

## 2017-01-30 ENCOUNTER — Ambulatory Visit (INDEPENDENT_AMBULATORY_CARE_PROVIDER_SITE_OTHER): Payer: Medicare Other | Admitting: Family Medicine

## 2017-01-30 ENCOUNTER — Other Ambulatory Visit: Payer: Self-pay

## 2017-01-30 VITALS — BP 176/76 | HR 65 | Temp 98.1°F | Resp 16 | Ht 60.0 in | Wt 132.5 lb

## 2017-01-30 DIAGNOSIS — I10 Essential (primary) hypertension: Secondary | ICD-10-CM

## 2017-01-30 DIAGNOSIS — F410 Panic disorder [episodic paroxysmal anxiety] without agoraphobia: Secondary | ICD-10-CM

## 2017-01-30 DIAGNOSIS — E7800 Pure hypercholesterolemia, unspecified: Secondary | ICD-10-CM | POA: Diagnosis not present

## 2017-01-30 DIAGNOSIS — I2510 Atherosclerotic heart disease of native coronary artery without angina pectoris: Secondary | ICD-10-CM | POA: Diagnosis not present

## 2017-01-30 LAB — BASIC METABOLIC PANEL
BUN: 13 mg/dL (ref 6–23)
CHLORIDE: 97 meq/L (ref 96–112)
CO2: 30 mEq/L (ref 19–32)
Calcium: 9.9 mg/dL (ref 8.4–10.5)
Creatinine, Ser: 0.7 mg/dL (ref 0.40–1.20)
GFR: 83.14 mL/min (ref >60.00)
GLUCOSE: 100 mg/dL — AB (ref 70–99)
POTASSIUM: 4.7 meq/L (ref 3.5–5.1)
SODIUM: 135 meq/L (ref 135–145)

## 2017-01-30 LAB — LIPID PANEL
CHOLESTEROL: 232 mg/dL — AB (ref 0–200)
HDL: 68.8 mg/dL (ref >39.00)
LDL CALC: 144 mg/dL — AB (ref 0–99)
NonHDL: 163.2
Total CHOL/HDL Ratio: 3
Triglycerides: 98 mg/dL (ref 0.0–149.0)
VLDL: 19.6 mg/dL (ref 0.0–40.0)

## 2017-01-30 MED ORDER — NAFTIFINE HCL 2 % EX GEL: CUTANEOUS | 3 refills | Status: DC

## 2017-01-30 NOTE — Progress Notes (Signed)
OFFICE VISIT  01/30/2017   CC:  Chief Complaint  Patient presents with  . Follow-up    RCI, pt is fasting.     HPI:    Patient is a 83 y.o.  female who presents for 6 mo f/u HTN, HLD with hx of statin intolerance (she declines any further trials of cholesterol lowering meds), and episodic anxiety/panic attacks that required prn alprazolam.   HLD: she is trying to avoid meats. Wants recheck of cholesterol.  On-line  controlled substance database reviewed today: no suspicious activity. Discussed controlled substance contract for her xanax use, this was signed today. Has to use alprazolam very rarely, approx 1-2 per month for more extreme days of anxiety and some mild panic.  HTN: normal at home with regular checks.  She is anxious this morning b/c of weather prediction of possible ice tomorrow and she has some appointments.  She still drives locally.  She requests some naftin gel 2% for tinea that is between her toes. Derm is treating her for some seb derm of scalp.  ROS: no CP, no SOB, no melena or hematochezia, no myalgias. She has some pinkish and flaky rash between toes that itch.  She has used naftin 2% on this in the past and it has helped: she requests rx for this med today.  Past Medical History:  Diagnosis Date  . Allergic state 04/30/2011  . Anxiety   . BCC (basal cell carcinoma), face 11/06/2010  . Bilateral bunions 11/06/2010  . Constipation 11/06/2010  . Coronary artery disease 2011   BMS x 3  . GERD (gastroesophageal reflux disease) 05/11/2012  . History of hyperkalemia 01/23/2011  . Hyperlipidemia    Intolerant of statins and zetia.  Refuses to consider PCSK9 inhibitor.  . Hypertension   . Lesion of labia 02/04/2012  . Melanoma in situ [facility]) 08/2016   Derm= Dr. Renda Rolls @ [facility] specialists in [place].  . Osteoarthritis of both knees    R>L  . Osteopenia 11/23/2010; 02/2016   2017 T-score -1.9.  Repeat DEXA 2 yrs.  . Peripheral neuropathy 11/06/2010   Small fiber, non-diabetic per neurologist  . Right lumbar radiculopathy 01/2015   Improved with PT  . SCC (squamous cell carcinoma), arm 11/06/2010  . Seborrheic keratosis 04/30/2011  . Vertigo     Past Surgical History:  Procedure Laterality Date  . CARDIAC CATHETERIZATION  12/20/2009   3 bare metal stents  . DEXA  03/05/2016   T-score -1.9  . EYE SURGERY     cataract- left eye 09-24-11  . SKIN BIOPSY  08/2012   Shave excision of lesion on pt's back: irritated seb keratosis.  . TRANSTHORACIC ECHOCARDIOGRAM  12/05/2009   Mild LVH, normal systolic fxn, mild impaired relaxation.  No signif valvular dz.  . TUBAL LIGATION      Outpatient Medications Prior to Visit  Medication Sig Dispense Refill  . ALPRAZolam (XANAX) 0.25 MG tablet Take 1 tablet (0.25 mg total) by mouth daily as needed. Panic attack 30 tablet 1  . amLODipine (NORVASC) 2.5 MG tablet TAKE ONE TABLET BY MOUTH ONCE DAILY 180 tablet 1  . aspirin 81 MG tablet Take 81 mg by mouth daily.    Marland Kitchen azelastine (OPTIVAR) 0.05 % ophthalmic solution INSTILL TWO DROPS INTO EACH EYE TWICE DAILY 6 mL 12  . Benfotiamine 150 MG CAPS Take 1 capsule by mouth daily. For numbness in feet    . Cyanocobalamin (VITAMIN B-12 CR PO) Take 1 tablet by mouth daily.     Marland Kitchen  DiphenhydrAMINE HCl (BENADRYL ALLERGY PO) Take 1 tablet by mouth as needed (allergies).     . enalapril (VASOTEC) 20 MG tablet TAKE 1 TABLET BY MOUTH IN THE MORNING AND TAKE 1/2 (ONE-HALF) TABLET AT BEDTIME 135 tablet 1  . magnesium oxide (MAG-OX) 400 MG tablet Take 400 mg by mouth once a week.     . metoprolol succinate (TOPROL-XL) 25 MG 24 hr tablet Take 1 tablet (25 mg total) by mouth daily. 90 tablet 3  . Multiple Vitamin (MULTIVITAMIN) tablet Take 1 tablet by mouth daily. Takes occ    . Omega-3 Fatty Acids (FISH OIL PO) Take 1 capsule by mouth daily.     . mupirocin ointment (BACTROBAN) 2 % Apply to affected area tid x 10d (Patient not taking: Reported on 01/30/2017) 15 g 0   No  facility-administered medications prior to visit.     Allergies  Allergen Reactions  . Chlorthalidone     hyponatremia  . Atenolol     Caused fatigue   . Besivance [Besifloxacin Hcl]     rash  . Codeine Nausea And Vomiting  . Crestor [Rosuvastatin]     "leg gave away"  . Gabapentin     Memory loss and confusion  . Lipitor [Atorvastatin] Other (See Comments)    Severe leg pain  . Lovastatin     myalgia  . Simvastatin     Her leg gave away on her and states thinks secondary to Simvastatin  . Vigamox [Moxifloxacin]     rash  . Welchol [Colesevelam Hcl]     ROS As per HPI  PE: Blood pressure (!) 176/76, pulse 65, temperature 98.1 F (36.7 C), temperature source Oral, resp. rate 16, height 5' (1.524 m), weight 132 lb 8 oz (60.1 kg), SpO2 96 %. Gen: Alert, well appearing.  Patient is oriented to person, place, time, and situation. AFFECT: pleasant, lucid thought and speech. CV: RRR, no m/r/g.   LUNGS: CTA bilat, nonlabored resps, good aeration in all lung fields. EXT: 1-2 + pitting edema bilat. Right lower leg with small skin tear w/out surrounding erythema or warmth or tenderness.  No active bleeding.  LABS:    Chemistry      Component Value Date/Time   NA 135 07/30/2016 1054   K 4.7 07/30/2016 1054   CL 100 07/30/2016 1054   CO2 27 07/30/2016 1054   BUN 15 07/30/2016 1054   CREATININE 0.78 07/30/2016 1054   CREATININE 0.78 06/06/2010 1745      Component Value Date/Time   CALCIUM 9.7 07/30/2016 1054   ALKPHOS 52 07/30/2016 1054   AST 10 07/30/2016 1054   ALT 13 07/30/2016 1054   BILITOT 0.6 07/30/2016 1054     Lab Results  Component Value Date   CHOL 256 (H) 07/30/2016   HDL 71.40 07/30/2016   LDLCALC 166 (H) 07/30/2016   LDLDIRECT 128.0 12/02/2012   TRIG 93.0 07/30/2016   CHOLHDL 4 07/30/2016   IMPRESSION AND PLAN:  1) HTN: The current medical regimen is effective;  continue present plan and medications. Lytes/Cr today.  2) HLD: statin intolerant,  refuses any further trial of ANY cholesterol-lowering med. Has been working on diet--will recheck cholesterol today.  She does walk daily for exercise. FLP today.  3) Chronic anxiety, with periodic/rare panic attacks. She uses alprazolam very sparingly. CSC signed and in chart today. No UDS today.  4) Tinea pedis: naftin gel 2% rx'd today.  An After Visit Summary was printed and given to the patient.  FOLLOW UP: Return in about 6 months (around 07/30/2017) for routine chronic illness f/u.  Signed:  Crissie Sickles, MD           01/30/2017

## 2017-02-06 ENCOUNTER — Encounter: Payer: Self-pay | Admitting: Family Medicine

## 2017-02-06 ENCOUNTER — Ambulatory Visit (INDEPENDENT_AMBULATORY_CARE_PROVIDER_SITE_OTHER): Payer: Medicare Other | Admitting: Family Medicine

## 2017-02-06 ENCOUNTER — Telehealth: Payer: Self-pay | Admitting: Family Medicine

## 2017-02-06 VITALS — BP 150/86 | HR 97 | Temp 97.6°F | Resp 16 | Wt 134.0 lb

## 2017-02-06 DIAGNOSIS — I2510 Atherosclerotic heart disease of native coronary artery without angina pectoris: Secondary | ICD-10-CM | POA: Diagnosis not present

## 2017-02-06 DIAGNOSIS — T148XXA Other injury of unspecified body region, initial encounter: Secondary | ICD-10-CM | POA: Diagnosis not present

## 2017-02-06 DIAGNOSIS — L089 Local infection of the skin and subcutaneous tissue, unspecified: Secondary | ICD-10-CM | POA: Diagnosis not present

## 2017-02-06 MED ORDER — CEPHALEXIN 500 MG PO CAPS: ORAL_CAPSULE | ORAL | 0 refills | Status: DC

## 2017-02-06 NOTE — Telephone Encounter (Signed)
Patient is checking were Rx went today. The [facility] does not have it. Please call patient back.

## 2017-02-06 NOTE — Progress Notes (Signed)
OFFICE VISIT  02/06/2017   CC:  Chief Complaint  Patient presents with  . Trauma    left leg x 1 week    HPI:    Patient is a 83 y.o. Caucasian female who presents for R lower leg skin tear that she feels is getting worse. The area was still bleeding a bit each day until last night.  It doesn't hurt unless it is touched. Her son felt like it had some warmth recently, also had some redness around the area that is now gone. When walking it does not hurt.  No fever or malaise.   Past Medical History:  Diagnosis Date  . Allergic state 04/30/2011  . Anxiety   . BCC (basal cell carcinoma), face 11/06/2010  . Bilateral bunions 11/06/2010  . Constipation 11/06/2010  . Coronary artery disease 2011   BMS x 3  . GERD (gastroesophageal reflux disease) 05/11/2012  . History of hyperkalemia 01/23/2011  . Hyperlipidemia    Intolerant of statins and zetia.  Refuses to consider PCSK9 inhibitor.  . Hypertension   . Lesion of labia 02/04/2012  . Melanoma in situ [facility]) 08/2016   Derm= Dr. Renda Rolls @ [facility] specialists in [place].  . Osteoarthritis of both knees    R>L  . Osteopenia 11/23/2010; 02/2016   2017 T-score -1.9.  Repeat DEXA 2 yrs.  . Peripheral neuropathy 11/06/2010   Small fiber, non-diabetic per neurologist  . Right lumbar radiculopathy 01/2015   Improved with PT  . SCC (squamous cell carcinoma), arm 11/06/2010  . Seborrheic keratosis 04/30/2011  . Vertigo     Past Surgical History:  Procedure Laterality Date  . CARDIAC CATHETERIZATION  12/20/2009   3 bare metal stents  . DEXA  03/05/2016   T-score -1.9  . EYE SURGERY     cataract- left eye 09-24-11  . SKIN BIOPSY  08/2012   Shave excision of lesion on pt's back: irritated seb keratosis.  . TRANSTHORACIC ECHOCARDIOGRAM  12/05/2009   Mild LVH, normal systolic fxn, mild impaired relaxation.  No signif valvular dz.  . TUBAL LIGATION      Outpatient Medications Prior to Visit  Medication Sig Dispense Refill  . ALPRAZolam  (XANAX) 0.25 MG tablet Take 1 tablet (0.25 mg total) by mouth daily as needed. Panic attack 30 tablet 1  . amLODipine (NORVASC) 2.5 MG tablet TAKE ONE TABLET BY MOUTH ONCE DAILY 180 tablet 1  . aspirin 81 MG tablet Take 81 mg by mouth daily.    Marland Kitchen azelastine (OPTIVAR) 0.05 % ophthalmic solution INSTILL TWO DROPS INTO EACH EYE TWICE DAILY 6 mL 12  . Benfotiamine 150 MG CAPS Take 1 capsule by mouth daily. For numbness in feet    . Cyanocobalamin (VITAMIN B-12 CR PO) Take 1 tablet by mouth daily.     . DiphenhydrAMINE HCl (BENADRYL ALLERGY PO) Take 1 tablet by mouth as needed (allergies).     . enalapril (VASOTEC) 20 MG tablet TAKE 1 TABLET BY MOUTH IN THE MORNING AND TAKE 1/2 (ONE-HALF) TABLET AT BEDTIME 135 tablet 1  . magnesium oxide (MAG-OX) 400 MG tablet Take 400 mg by mouth once a week.     . metoprolol succinate (TOPROL-XL) 25 MG 24 hr tablet Take 1 tablet (25 mg total) by mouth daily. 90 tablet 3  . Multiple Vitamin (MULTIVITAMIN) tablet Take 1 tablet by mouth daily. Takes occ    . Naftifine HCl (NAFTIN) 2 % GEL Apply to affected area qd-bid 60 g 3  . Omega-3 Fatty  Acids (FISH OIL PO) Take 1 capsule by mouth daily.      No facility-administered medications prior to visit.     Allergies  Allergen Reactions  . Chlorthalidone     hyponatremia  . Atenolol     Caused fatigue   . Besivance [Besifloxacin Hcl]     rash  . Codeine Nausea And Vomiting  . Crestor [Rosuvastatin]     "leg gave away"  . Gabapentin     Memory loss and confusion  . Lipitor [Atorvastatin] Other (See Comments)    Severe leg pain  . Lovastatin     myalgia  . Simvastatin     Her leg gave away on her and states thinks secondary to Simvastatin  . Vigamox [Moxifloxacin]     rash  . Welchol [Colesevelam Hcl]     ROS As per HPI  PE: Blood pressure (!) 150/86, pulse 97, temperature 97.6 F (36.4 C), temperature source Oral, resp. rate 16, weight 134 lb (60.8 kg), SpO2 97 %. Gen: Alert, well appearing.   Patient is oriented to person, place, time, and situation. AFFECT: pleasant, lucid thought and speech. R lower leg pretibial skin with a 4 cm oval area of violacious and slightly pink hue, with central scab that is V shaped. No bleeding.  No exudate.  This area is significantly tender to touch.  No excessive warmth.  No fluctuance. No significant LL edema.  LABS:    Chemistry      Component Value Date/Time   NA 135 01/30/2017 0904   K 4.7 01/30/2017 0904   CL 97 01/30/2017 0904   CO2 30 01/30/2017 0904   BUN 13 01/30/2017 0904   CREATININE 0.70 01/30/2017 0904   CREATININE 0.78 06/06/2010 1745      Component Value Date/Time   CALCIUM 9.9 01/30/2017 0904   ALKPHOS 52 07/30/2016 1054   AST 10 07/30/2016 1054   ALT 13 07/30/2016 1054   BILITOT 0.6 07/30/2016 1054     Lab Results  Component Value Date   WBC 5.0 05/02/2015   HGB 14.2 05/02/2015   HCT 42.1 05/02/2015   MCV 94.4 05/02/2015   PLT 172 05/02/2015     IMPRESSION AND PLAN:  Skin tag, healing fine but possibly a bit of cellulitis/wound infection at the site. Will cover with keflex 500 mg bid x 7d. Signs/symptoms to call or return for were reviewed and pt expressed understanding.  An After Visit Summary was printed and given to the patient.  FOLLOW UP: Return if symptoms worsen or fail to improve.  Signed:  Crissie Sickles, MD           02/06/2017

## 2017-02-06 NOTE — Telephone Encounter (Signed)
Patient notified that prescription was escribed at 10:07am. Patient will call pharmacy back regarding medication.

## 2017-02-19 ENCOUNTER — Encounter: Payer: Self-pay | Admitting: Cardiovascular Disease

## 2017-02-19 ENCOUNTER — Ambulatory Visit (INDEPENDENT_AMBULATORY_CARE_PROVIDER_SITE_OTHER): Payer: Medicare Other | Admitting: Cardiovascular Disease

## 2017-02-19 VITALS — BP 160/79 | HR 77 | Ht 60.0 in | Wt 133.4 lb

## 2017-02-19 DIAGNOSIS — I2510 Atherosclerotic heart disease of native coronary artery without angina pectoris: Secondary | ICD-10-CM

## 2017-02-19 DIAGNOSIS — E7800 Pure hypercholesterolemia, unspecified: Secondary | ICD-10-CM | POA: Diagnosis not present

## 2017-02-19 DIAGNOSIS — I119 Hypertensive heart disease without heart failure: Secondary | ICD-10-CM | POA: Diagnosis not present

## 2017-02-19 NOTE — Progress Notes (Signed)
Cardiology Office Note   Date:  02/19/2017   ID:  Jennifer Peck, DOB [birth year], MRN [id number]  PCP:  Jennifer Sou, MD  Cardiologist:   Jennifer Latch, MD   No chief complaint on file.     History of Present Illness: Jennifer Peck is a 83 y.o. female with CAD s/p PCI, hypertension and hyperlipidemia who presents for follow-up.  Jennifer Peck was previously patient of [name]. She last saw him 02/2015.  She had a bare metal stents placed in her left circumflex, RCA, OM 1 and OM 2 vessels in 2011.  She has not tolerated statin medications or Zetia in the past due to myalgias.  She has tried to manage her lipids with diet and exercise.  She was not interested in trying a PCSK9 inhibitor.  Since her last appointment [name] has been feeling well.  She continues to exercise regularly.  Her only complaint is a cut in her leg.  She dropped a clipboard in the middle part scraped her leg on 01/26/17.  She has been treated with antibiotics as has no fevers or chills.  She also denies drainage.  The area is not sore but will not heal.  Denies any chest pain or shortness of breath.  She also denies orthopnea or PND.  She has some trouble eating sometimes because of her dentures is otherwise without complaint today.  She notes that she has whitecoat hypertension but does not check her blood pressure regularly at home.  Past Medical History:  Diagnosis Date  . Allergic state 04/30/2011  . Anxiety   . BCC (basal cell carcinoma), face 11/06/2010  . Bilateral bunions 11/06/2010  . Constipation 11/06/2010  . Coronary artery disease 2011   BMS x 3  . GERD (gastroesophageal reflux disease) 05/11/2012  . History of hyperkalemia 01/23/2011  . Hyperlipidemia    Intolerant of statins and zetia.  Refuses to consider PCSK9 inhibitor.  . Hypertension   . Lesion of labia 02/04/2012  . Melanoma in situ [facility]) 08/2016   Derm= Dr. Renda Rolls @ [facility] specialists in [place].  . Osteoarthritis of  both knees    R>L  . Osteopenia 11/23/2010; 02/2016   2017 T-score -1.9.  Repeat DEXA 2 yrs.  . Peripheral neuropathy 11/06/2010   Small fiber, non-diabetic per neurologist  . Right lumbar radiculopathy 01/2015   Improved with PT  . SCC (squamous cell carcinoma), arm 11/06/2010  . Seborrheic keratosis 04/30/2011  . Vertigo     Past Surgical History:  Procedure Laterality Date  . CARDIAC CATHETERIZATION  12/20/2009   3 bare metal stents  . DEXA  03/05/2016   T-score -1.9  . EYE SURGERY     cataract- left eye 09-24-11  . SKIN BIOPSY  08/2012   Shave excision of lesion on pt's back: irritated seb keratosis.  . TRANSTHORACIC ECHOCARDIOGRAM  12/05/2009   Mild LVH, normal systolic fxn, mild impaired relaxation.  No signif valvular dz.  . TUBAL LIGATION       Current Outpatient Medications  Medication Sig Dispense Refill  . ALPRAZolam (XANAX) 0.25 MG tablet Take 1 tablet (0.25 mg total) by mouth daily as needed. Panic attack 30 tablet 1  . amLODipine (NORVASC) 2.5 MG tablet TAKE ONE TABLET BY MOUTH ONCE DAILY 180 tablet 1  . aspirin 81 MG tablet Take 81 mg by mouth daily.    Marland Kitchen azelastine (OPTIVAR) 0.05 % ophthalmic solution INSTILL TWO DROPS INTO EACH EYE TWICE DAILY 6 mL 12  .  Benfotiamine 150 MG CAPS Take 1 capsule by mouth daily. For numbness in feet    . cephALEXin (KEFLEX) 500 MG capsule 1 cap po bid x 7d 14 capsule 0  . Cyanocobalamin (VITAMIN B-12 CR PO) Take 1 tablet by mouth daily.     . DiphenhydrAMINE HCl (BENADRYL ALLERGY PO) Take 1 tablet by mouth as needed (allergies).     . enalapril (VASOTEC) 20 MG tablet TAKE 1 TABLET BY MOUTH IN THE MORNING AND TAKE 1/2 (ONE-HALF) TABLET AT BEDTIME 135 tablet 1  . magnesium oxide (MAG-OX) 400 MG tablet Take 400 mg by mouth once a week.     . metoprolol succinate (TOPROL-XL) 25 MG 24 hr tablet Take 1 tablet (25 mg total) by mouth daily. 90 tablet 3  . Multiple Vitamin (MULTIVITAMIN) tablet Take 1 tablet by mouth daily. Takes occ    .  Naftifine HCl (NAFTIN) 2 % GEL Apply to affected area qd-bid 60 g 3  . Omega-3 Fatty Acids (FISH OIL PO) Take 1 capsule by mouth daily.      No current facility-administered medications for this visit.     Allergies:   Chlorthalidone; Atenolol; Besivance [besifloxacin hcl]; Codeine; Crestor [rosuvastatin]; Gabapentin; Lipitor [atorvastatin]; Lovastatin; Simvastatin; Vigamox [moxifloxacin]; and Welchol [colesevelam hcl]    Social History:  The patient  reports that she quit smoking about 39 years ago. she has never used smokeless tobacco. She reports that she drinks alcohol. She reports that she does not use drugs.   Family History:  The patient's family history includes Asthma in her paternal grandfather; Cancer in her daughter; Heart disease in her father; Hyperlipidemia in her daughter and son; Hypertension in her daughter, daughter, son, son, and son; Stroke in her mother and paternal grandmother; Vision loss in her maternal grandfather.    ROS:  Please see the history of present illness.   Otherwise, review of systems are positive for none.   All other systems are reviewed and negative.    PHYSICAL EXAM: VS:  BP (!) 160/79   Pulse 77   Ht 5' (1.524 m)   Wt 133 lb 6.4 oz (60.5 kg)   BMI 26.05 kg/m  , BMI Body mass index is 26.05 kg/m. GENERAL:  Well appearing HEENT: Pupils equal round and reactive, fundi not visualized, oral mucosa unremarkable NECK:  No jugular venous distention, waveform within normal limits, carotid upstroke brisk and symmetric, no bruits, no thyromegaly LYMPHATICS:  No cervical adenopathy LUNGS:  Clear to auscultation bilaterally HEART:  RRR.  PMI not displaced or sustained,S1 and S2 within normal limits, no S3, no S4, no clicks, no rubs, no murmurs ABD:  Flat, positive bowel sounds normal in frequency in pitch, no bruits, no rebound, no guarding, no midline pulsatile mass, no hepatomegaly, no splenomegaly EXT:  2 plus pulses throughout, no edema, no cyanosis  no clubbing.  R LE induration and nickel-sized wound with ulceration  SKIN:  No rashes no nodules NEURO:  Cranial nerves II through XII grossly intact, motor grossly intact throughout PSYCH:  Cognitively intact, oriented to person place and time    EKG:  EKG is ordered today. The ekg ordered 03/14/17 demonstrates sinus rhythm. Rate 77 bpm.   02/19/17: Sinus rhythm.  Rate 70 bpm.  PVCs.    Recent Labs: 07/30/2016: ALT 13 01/30/2017: BUN 13; Creatinine, Ser 0.70; Potassium 4.7; Sodium 135    Lipid Panel    Component Value Date/Time   CHOL 232 (H) 01/30/2017 0904   TRIG 98.0 01/30/2017 0904  HDL 68.80 01/30/2017 0904   CHOLHDL 3 01/30/2017 0904   VLDL 19.6 01/30/2017 0904   LDLCALC 144 (H) 01/30/2017 0904   LDLDIRECT 128.0 12/02/2012 0837      Wt Readings from Last 3 Encounters:  02/19/17 133 lb 6.4 oz (60.5 kg)  02/06/17 134 lb (60.8 kg)  01/30/17 132 lb 8 oz (60.1 kg)      ASSESSMENT AND PLAN:  # CAD: Ms. Espino is doing well.  She continues to be very active and denies chest pain.  Continue aspirin and metoprolol.    # PVCs: Asymptmatic.  COntinue metoprolol.    # Hyperlipidemia: LDL has been elevated.  She has tried multiple statins and didn't tolerate them.  We will have her come back to lipid clinic and get her lipids checked.  She will consider Repatha.  # Hypertension: BP is above goal.  She thinks this is due to whitecoat hypertension.  She will get a blood pressure monitor and check at home.  For now continueamlodipine, enalapril, and metoprolol.   Current medicines are reviewed at length with the patient today.  The patient does not have concerns regarding medicines.  The following changes have been made:  no change  Labs/ tests ordered today include:   No orders of the defined types were placed in this encounter.   Disposition:   FU with Subrina Vecchiarelli C. Oval Linsey, MD, [facility] in 6 months.    This note was written with the assistance of speech recognition  software.  Please excuse any transcriptional errors.  Signed, Herley Bernardini C. Oval Linsey, MD, [facility]  02/19/2017 12:32 PM    [place]

## 2017-02-19 NOTE — Patient Instructions (Signed)
Medication Instructions:  Your physician recommends that you continue on your current medications as directed. Please refer to the Current Medication list given to you today.  Labwork: none  Testing/Procedures: none  Follow-Up: Your physician wants you to follow-up in: 6 month ov  You will receive a reminder letter in the mail two months in advance. If you don't receive a letter, please call our office to schedule the follow-up appointment.  Any Other Special Instructions Will Be Listed Below (If Applicable). MONITOR YOUR BLOOD PRESSURE AT HOME. IF YOUR READINGS ARE RUNNING GREATER THAN 140/90 CALL THE OFFICE [phone]   If you need a refill on your cardiac medications before your next appointment, please call your pharmacy.

## 2017-02-27 ENCOUNTER — Encounter: Payer: Self-pay | Admitting: Family Medicine

## 2017-03-03 ENCOUNTER — Other Ambulatory Visit: Payer: Self-pay | Admitting: Family Medicine

## 2017-05-03 NOTE — Progress Notes (Signed)
Subjective:   Jennifer Peck is a 84 y.o. female who presents for Medicare Annual (Subsequent) preventive examination.  Review of Systems:  No ROS.  Medicare Wellness Visit. Additional risk factors are reflected in the social history.  Cardiac Risk Factors include: advanced age (>75men, >9 women);dyslipidemia;hypertension;family history of premature cardiovascular disease   Sleep patterns: Sleeps 8 hours. Up to void x 2.  Home Safety/Smoke Alarms: Feels safe in home. Smoke alarms in place.  Living environment; residence and Firearm Safety: Lives alone in single story home, steps going in home. Rail in place. Closest family member 36 minutes away.  Seat Belt Safety/Bike Helmet: Wears seat belt.   Female:   Pap-N/A       Mammo-05/20/2006, negative. Declines further testing       Dexa scan-03/06/2016, Osteopenia. Declines further testing.         CCS-Colonoscopy 05/17/2004, normal. No recall     Objective:     Vitals: BP 132/70 (BP Location: Left Arm, Patient Position: Sitting, Cuff Size: Normal)   Pulse 70   Ht 5' (1.524 m)   Wt 135 lb 12.8 oz (61.6 kg)   SpO2 98%   BMI 26.52 kg/m   Body mass index is 26.52 kg/m.  Advanced Directives 05/06/2017 04/30/2016 05/02/2015  Does Patient Have a Medical Advance Directive? Yes Yes No  Type of Advance Directive Living will;Healthcare Power of Attorney Living will -  Copy of [place] in Chart? No - copy requested - -    Tobacco Social History   Tobacco Use  Smoking Status Former Smoker  . Last attempt to quit: 09/04/1977  . Years since quitting: 39.6  Smokeless Tobacco Never Used     Counseling given: Not Answered    Past Medical History:  Diagnosis Date  . Allergic state 04/30/2011  . Anxiety   . BCC (basal cell carcinoma), face 11/06/2010  . Bilateral bunions 11/06/2010  . Constipation 11/06/2010  . Coronary artery disease 2011   BMS x 3  . GERD (gastroesophageal reflux disease) 05/11/2012  . History  of hyperkalemia 01/23/2011  . Hyperlipidemia    Intolerant of statins and zetia.  Refused to consider PCSK9 inhibitor initially but as of 02/2017 cardiol f/u she would consider repatha if approved.  . Hypertension   . Lesion of labia 02/04/2012  . Melanoma in situ [facility] 08/2016   Derm= Dr. Renda Rolls @ dermatology specialists in [place].  . Osteoarthritis of both knees    R>L  . Osteopenia 11/23/2010; 02/2016   2017 T-score -1.9.  Repeat DEXA 2 yrs.  . Peripheral neuropathy 11/06/2010   Small fiber, non-diabetic per neurologist  . Right lumbar radiculopathy 01/2015   Improved with PT  . SCC (squamous cell carcinoma), arm 11/06/2010  . Seborrheic keratosis 04/30/2011  . Vertigo    Past Surgical History:  Procedure Laterality Date  . CARDIAC CATHETERIZATION  12/20/2009   3 bare metal stents  . DEXA  03/05/2016   T-score -1.9  . EYE SURGERY     cataract- left eye 09-24-11  . SKIN BIOPSY  08/2012   Shave excision of lesion on pt's back: irritated seb keratosis.  . TRANSTHORACIC ECHOCARDIOGRAM  12/05/2009   Mild LVH, normal systolic fxn, mild impaired relaxation.  No signif valvular dz.  . TUBAL LIGATION     Family History  Problem Relation Age of Onset  . Stroke Mother   . Heart disease Father   . Hypertension Daughter   . Hyperlipidemia Daughter   .  Hypertension Son   . Cancer Daughter        rectal/ chemo and radiation  . Hypertension Daughter   . Hypertension Son   . Hyperlipidemia Son   . Hypertension Son   . Vision loss Maternal Grandfather   . Stroke Paternal Grandmother   . Asthma Paternal Grandfather    Social History   Socioeconomic History  . Marital status: Single    Spouse name: None  . Number of children: None  . Years of education: None  . Highest education level: None  Social Needs  . Financial resource strain: None  . Food insecurity - worry: None  . Food insecurity - inability: None  . Transportation needs - medical: None  . Transportation needs -  non-medical: None  Occupational History  . None  Tobacco Use  . Smoking status: Former Smoker    Last attempt to quit: 09/04/1977    Years since quitting: 39.6  . Smokeless tobacco: Never Used  Substance and Sexual Activity  . Alcohol use: Yes    Comment: only on Holidays  . Drug use: No  . Sexual activity: None  Other Topics Concern  . None  Social History Narrative   Lives alone near [place].  Two daughters in [place], [place].   Widowed about 30 yrs.   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   Former smoker: approx 30 pack-yr hx, quit 1979.   Occasional social alcohol.   Exercises 3 days a week at [place].   Takes knitting classes and computer classes.    Outpatient Encounter Medications as of 05/06/2017  Medication Sig  . ALPRAZolam (XANAX) 0.25 MG tablet Take 1 tablet (0.25 mg total) by mouth daily as needed. Panic attack  . amLODipine (NORVASC) 2.5 MG tablet TAKE ONE TABLET BY MOUTH ONCE DAILY  . aspirin 81 MG tablet Take 81 mg by mouth daily.  Marland Kitchen azelastine (OPTIVAR) 0.05 % ophthalmic solution INSTILL TWO DROPS INTO EACH EYE TWICE DAILY  . Benfotiamine 150 MG CAPS Take 1 capsule by mouth daily. For numbness in feet  . Cyanocobalamin (VITAMIN B-12 CR PO) Take 1 tablet by mouth daily.   . enalapril (VASOTEC) 20 MG tablet TAKE 1 TABLET BY MOUTH IN THE MORNING AND TAKE 1/2 (ONE-HALF) TABLET AT BEDTIME  . magnesium oxide (MAG-OX) 400 MG tablet Take 400 mg by mouth once a week.   . metoprolol succinate (TOPROL-XL) 25 MG 24 hr tablet TAKE ONE TABLET BY MOUTH ONCE DAILY  . Multiple Vitamin (MULTIVITAMIN) tablet Take 1 tablet by mouth daily. Takes occ  . Naftifine HCl (NAFTIN) 2 % GEL Apply to affected area qd-bid  . Omega-3 Fatty Acids (FISH OIL PO) Take 1 capsule by mouth daily.   . [DISCONTINUED] DiphenhydrAMINE HCl (BENADRYL ALLERGY PO) Take 1 tablet by mouth as needed (allergies).   . [DISCONTINUED] cephALEXin (KEFLEX) 500 MG  capsule 1 cap po bid x 7d   No facility-administered encounter medications on file as of 05/06/2017.     Activities of Daily Living In your present state of health, do you have any difficulty performing the following activities: 05/06/2017  Hearing? N  Vision? N  Difficulty concentrating or making decisions? N  Walking or climbing stairs? N  Dressing or bathing? N  Doing errands, shopping? N  Preparing Food and eating ? N  Using the Toilet? N  In the past six months, have you accidently leaked urine? N  Do you have problems with loss  of bowel control? N  Managing your Medications? N  Managing your Finances? N  Housekeeping or managing your Housekeeping? N  Some recent data might be hidden    Patient Care Team: Tammi Sou, MD as PCP - General (Family Medicine) Skeet Latch, MD as Consulting Physician (Cardiology) [place], Jennefer Bravo, MD as Referring Physician (Dermatology)    Assessment:   This is a routine wellness examination for [facility].  Exercise Activities and Dietary recommendations Current Exercise Habits: Structured exercise class, Type of exercise: calisthenics;walking;strength training/weights, Time (Minutes): 60, Frequency (Times/Week): 7, Weekly Exercise (Minutes/Week): 420, Exercise limited by: None identified   Diet (meal preparation, eat out, water intake, caffeinated beverages, dairy products, fruits and vegetables): Drinks coffee (cream) and water.   Eats 3 meals/day, heart healthy. No red meat.   Goals    . Patient Stated     Maintain current health by staying active and eating well.        Fall Risk Fall Risk  05/06/2017 04/30/2016 02/08/2016 09/28/2014  Falls in the past year? No No No Yes  Number falls in past yr: - - - 1  Injury with Fall? - - - Yes    Depression Screen PHQ 2/9 Scores 05/06/2017 04/30/2016 02/08/2016 09/28/2014  PHQ - 2 Score 0 0 0 0     Cognitive Function MMSE - Mini Mental State Exam 04/30/2016  Orientation to time  5  Orientation to Place 5  Registration 3  Attention/ Calculation 5  Recall 3  Language- name 2 objects 2  Language- repeat 1  Language- follow 3 step command 3  Language- read & follow direction 1  Write a sentence 1  Copy design 1  Total score 30        Immunization History  Administered Date(s) Administered  . Influenza Split 12/22/2010, 12/21/2011  . Influenza Whole 02/15/2010  . Influenza, High Dose Seasonal PF 02/01/2015, 01/02/2017  . Influenza,inj,Quad PF,6+ Mos 12/02/2012, 01/21/2014  . Influenza-Unspecified 01/18/2016  . Pneumococcal Conjugate-13 02/10/2014  . Pneumococcal Polysaccharide-23 12/17/2012  . Td 02/15/2010  . Zoster 03/20/2007     Screening Tests Health Maintenance  Topic Date Due  . TETANUS/TDAP  02/16/2020  . INFLUENZA VACCINE  Completed  . DEXA SCAN  Completed  . PNA vac Low Risk Adult  Completed        Plan:    Bring a copy of your living will and/or healthcare power of attorney to your next office visit.  Continue doing brain stimulating activities (puzzles, reading, adult coloring books, staying active) to keep memory sharp.   I have personally reviewed and noted the following in the patient's chart:   . Medical and social history . Use of alcohol, tobacco or illicit drugs  . Current medications and supplements . Functional ability and status . Nutritional status . Physical activity . Advanced directives . List of other physicians . Hospitalizations, surgeries, and ER visits in previous 12 months . Vitals . Screenings to include cognitive, depression, and falls . Referrals and appointments  In addition, I have reviewed and discussed with patient certain preventive protocols, quality metrics, and best practice recommendations. A written personalized care plan for preventive services as well as general preventive health recommendations were provided to patient.     Gerilyn Nestle, RN  05/06/2017   PCP Notes: -Declines  mammo/DEXA -Pt request "female exam" by female provider, denies issues.  -F/U with PCP 07/2017

## 2017-05-06 ENCOUNTER — Ambulatory Visit (INDEPENDENT_AMBULATORY_CARE_PROVIDER_SITE_OTHER): Payer: Medicare Other

## 2017-05-06 ENCOUNTER — Other Ambulatory Visit: Payer: Self-pay

## 2017-05-06 VITALS — BP 132/70 | HR 70 | Ht 60.0 in | Wt 135.8 lb

## 2017-05-06 DIAGNOSIS — Z0000 Encounter for general adult medical examination without abnormal findings: Secondary | ICD-10-CM

## 2017-05-06 NOTE — Patient Instructions (Addendum)
Bring a copy of your living will and/or healthcare power of attorney to your next office visit.  Continue doing brain stimulating activities (puzzles, reading, adult coloring books, staying active) to keep memory sharp.     Fall Prevention in the Home Falls can cause injuries. They can happen to people of all ages. There are many things you can do to make your home safe and to help prevent falls. What can I do on the outside of my home?  Regularly fix the edges of walkways and driveways and fix any cracks.  Remove anything that might make you trip as you walk through a door, such as a raised step or threshold.  Trim any bushes or trees on the path to your home.  Use bright outdoor lighting.  Clear any walking paths of anything that might make someone trip, such as rocks or tools.  Regularly check to see if handrails are loose or broken. Make sure that both sides of any steps have handrails.  Any raised decks and porches should have guardrails on the edges.  Have any leaves, snow, or ice cleared regularly.  Use sand or salt on walking paths during winter.  Clean up any spills in your garage right away. This includes oil or grease spills. What can I do in the bathroom?  Use night lights.  Install grab bars by the toilet and in the tub and shower. Do not use towel bars as grab bars.  Use non-skid mats or decals in the tub or shower.  If you need to sit down in the shower, use a plastic, non-slip stool.  Keep the floor dry. Clean up any water that spills on the floor as soon as it happens.  Remove soap buildup in the tub or shower regularly.  Attach bath mats securely with double-sided non-slip rug tape.  Do not have throw rugs and other things on the floor that can make you trip. What can I do in the bedroom?  Use night lights.  Make sure that you have a light by your bed that is easy to reach.  Do not use any sheets or blankets that are too big for your bed. They  should not hang down onto the floor.  Have a firm chair that has side arms. You can use this for support while you get dressed.  Do not have throw rugs and other things on the floor that can make you trip. What can I do in the kitchen?  Clean up any spills right away.  Avoid walking on wet floors.  Keep items that you use a lot in easy-to-reach places.  If you need to reach something above you, use a strong step stool that has a grab bar.  Keep electrical cords out of the way.  Do not use floor polish or wax that makes floors slippery. If you must use wax, use non-skid floor wax.  Do not have throw rugs and other things on the floor that can make you trip. What can I do with my stairs?  Do not leave any items on the stairs.  Make sure that there are handrails on both sides of the stairs and use them. Fix handrails that are broken or loose. Make sure that handrails are as long as the stairways.  Check any carpeting to make sure that it is firmly attached to the stairs. Fix any carpet that is loose or worn.  Avoid having throw rugs at the top or bottom of the stairs.  If you do have throw rugs, attach them to the floor with carpet tape.  Make sure that you have a light switch at the top of the stairs and the bottom of the stairs. If you do not have them, ask someone to add them for you. What else can I do to help prevent falls?  Wear shoes that: ? Do not have high heels. ? Have rubber bottoms. ? Are comfortable and fit you well. ? Are closed at the toe. Do not wear sandals.  If you use a stepladder: ? Make sure that it is fully opened. Do not climb a closed stepladder. ? Make sure that both sides of the stepladder are locked into place. ? Ask someone to hold it for you, if possible.  Clearly mark and make sure that you can see: ? Any grab bars or handrails. ? First and last steps. ? Where the edge of each step is.  Use tools that help you move around (mobility aids) if  they are needed. These include: ? Canes. ? Walkers. ? Scooters. ? Crutches.  Turn on the lights when you go into a dark area. Replace any light bulbs as soon as they burn out.  Set up your furniture so you have a clear path. Avoid moving your furniture around.  If any of your floors are uneven, fix them.  If there are any pets around you, be aware of where they are.  Review your medicines with your doctor. Some medicines can make you feel dizzy. This can increase your chance of falling. Ask your doctor what other things that you can do to help prevent falls. This information is not intended to replace advice given to you by your health care provider. Make sure you discuss any questions you have with your health care provider. Document Released: 12/30/2008 Document Revised: 08/11/2015 Document Reviewed: 04/09/2014 Elsevier Interactive Patient Education  2018 [employer] Maintenance, Female Adopting a healthy lifestyle and getting preventive care can go a long way to promote health and wellness. Talk with your health care provider about what schedule of regular examinations is right for you. This is a good chance for you to check in with your provider about disease prevention and staying healthy. In between checkups, there are plenty of things you can do on your own. Experts have done a lot of research about which lifestyle changes and preventive measures are most likely to keep you healthy. Ask your health care provider for more information. Weight and diet Eat a healthy diet  Be sure to include plenty of vegetables, fruits, low-fat dairy products, and lean protein.  Do not eat a lot of foods high in solid fats, added sugars, or salt.  Get regular exercise. This is one of the most important things you can do for your health. ? Most adults should exercise for at least 150 minutes each week. The exercise should increase your heart rate and make you sweat (moderate-intensity  exercise). ? Most adults should also do strengthening exercises at least twice a week. This is in addition to the moderate-intensity exercise.  Maintain a healthy weight  Body mass index (BMI) is a measurement that can be used to identify possible weight problems. It estimates body fat based on height and weight. Your health care provider can help determine your BMI and help you achieve or maintain a healthy weight.  For females 31 years of age and older: ? A BMI below 18.5 is considered underweight. ? A BMI  of 18.5 to 24.9 is normal. ? A BMI of 25 to 29.9 is considered overweight. ? A BMI of 30 and above is considered obese.  Watch levels of cholesterol and blood lipids  You should start having your blood tested for lipids and cholesterol at 82 years of age, then have this test every 5 years.  You may need to have your cholesterol levels checked more often if: ? Your lipid or cholesterol levels are high. ? You are older than 82 years of age. ? You are at high risk for heart disease.  Cancer screening Lung Cancer  Lung cancer screening is recommended for adults 88-6 years old who are at high risk for lung cancer because of a history of smoking.  A yearly low-dose CT scan of the lungs is recommended for people who: ? Currently smoke. ? Have quit within the past 15 years. ? Have at least a 30-pack-year history of smoking. A pack year is smoking an average of one pack of cigarettes a day for 1 year.  Yearly screening should continue until it has been 15 years since you quit.  Yearly screening should stop if you develop a health problem that would prevent you from having lung cancer treatment.  Breast Cancer  Practice breast self-awareness. This means understanding how your breasts normally appear and feel.  It also means doing regular breast self-exams. Let your health care provider know about any changes, no matter how small.  If you are in your 20s or 30s, you should have a  clinical breast exam (CBE) by a health care provider every 1-3 years as part of a regular health exam.  If you are 75 or older, have a CBE every year. Also consider having a breast X-ray (mammogram) every year.  If you have a family history of breast cancer, talk to your health care provider about genetic screening.  If you are at high risk for breast cancer, talk to your health care provider about having an MRI and a mammogram every year.  Breast cancer gene (BRCA) assessment is recommended for women who have family members with BRCA-related cancers. BRCA-related cancers include: ? Breast. ? Ovarian. ? Tubal. ? Peritoneal cancers.  Results of the assessment will determine the need for genetic counseling and BRCA1 and BRCA2 testing.  Cervical Cancer Your health care provider may recommend that you be screened regularly for cancer of the pelvic organs (ovaries, uterus, and vagina). This screening involves a pelvic examination, including checking for microscopic changes to the surface of your cervix (Pap test). You may be encouraged to have this screening done every 3 years, beginning at age 34.  For women ages 31-65, health care providers may recommend pelvic exams and Pap testing every 3 years, or they may recommend the Pap and pelvic exam, combined with testing for human papilloma virus (HPV), every 5 years. Some types of HPV increase your risk of cervical cancer. Testing for HPV may also be done on women of any age with unclear Pap test results.  Other health care providers may not recommend any screening for nonpregnant women who are considered low risk for pelvic cancer and who do not have symptoms. Ask your health care provider if a screening pelvic exam is right for you.  If you have had past treatment for cervical cancer or a condition that could lead to cancer, you need Pap tests and screening for cancer for at least 20 years after your treatment. If Pap tests have been discontinued,  your risk factors (such as having a new sexual partner) need to be reassessed to determine if screening should resume. Some women have medical problems that increase the chance of getting cervical cancer. In these cases, your health care provider may recommend more frequent screening and Pap tests.  Colorectal Cancer  This type of cancer can be detected and often prevented.  Routine colorectal cancer screening usually begins at 82 years of age and continues through 82 years of age.  Your health care provider may recommend screening at an earlier age if you have risk factors for colon cancer.  Your health care provider may also recommend using home test kits to check for hidden blood in the stool.  A small camera at the end of a tube can be used to examine your colon directly (sigmoidoscopy or colonoscopy). This is done to check for the earliest forms of colorectal cancer.  Routine screening usually begins at age 42.  Direct examination of the colon should be repeated every 5-10 years through 82 years of age. However, you may need to be screened more often if early forms of precancerous polyps or small growths are found.  Skin Cancer  Check your skin from head to toe regularly.  Tell your health care provider about any new moles or changes in moles, especially if there is a change in a mole's shape or color.  Also tell your health care provider if you have a mole that is larger than the size of a pencil eraser.  Always use sunscreen. Apply sunscreen liberally and repeatedly throughout the day.  Protect yourself by wearing long sleeves, pants, a wide-brimmed hat, and sunglasses whenever you are outside.  Heart disease, diabetes, and high blood pressure  High blood pressure causes heart disease and increases the risk of stroke. High blood pressure is more likely to develop in: ? People who have blood pressure in the high end of the normal range (130-139/85-89 mm Hg). ? People who are  overweight or obese. ? People who are African American.  If you are 45-1 years of age, have your blood pressure checked every 3-5 years. If you are 15 years of age or older, have your blood pressure checked every year. You should have your blood pressure measured twice-once when you are at a hospital or clinic, and once when you are not at a hospital or clinic. Record the average of the two measurements. To check your blood pressure when you are not at a hospital or clinic, you can use: ? An automated blood pressure machine at a pharmacy. ? A home blood pressure monitor.  If you are between 76 years and 30 years old, ask your health care provider if you should take aspirin to prevent strokes.  Have regular diabetes screenings. This involves taking a blood sample to check your fasting blood sugar level. ? If you are at a normal weight and have a low risk for diabetes, have this test once every three years after 82 years of age. ? If you are overweight and have a high risk for diabetes, consider being tested at a younger age or more often. Preventing infection Hepatitis B  If you have a higher risk for hepatitis B, you should be screened for this virus. You are considered at high risk for hepatitis B if: ? You were born in a country where hepatitis B is common. Ask your health care provider which countries are considered high risk. ? Your parents were born in a high-risk  country, and you have not been immunized against hepatitis B (hepatitis B vaccine). ? You have HIV or AIDS. ? You use needles to inject street drugs. ? You live with someone who has hepatitis B. ? You have had sex with someone who has hepatitis B. ? You get hemodialysis treatment. ? You take certain medicines for conditions, including cancer, organ transplantation, and autoimmune conditions.  Hepatitis C  Blood testing is recommended for: ? Everyone born from [birth year] through 1963. ? Anyone with known risk factors for  hepatitis C.  Sexually transmitted infections (STIs)  You should be screened for sexually transmitted infections (STIs) including gonorrhea and chlamydia if: ? You are sexually active and are younger than 82 years of age. ? You are older than 82 years of age and your health care provider tells you that you are at risk for this type of infection. ? Your sexual activity has changed since you were last screened and you are at an increased risk for chlamydia or gonorrhea. Ask your health care provider if you are at risk.  If you do not have HIV, but are at risk, it may be recommended that you take a prescription medicine daily to prevent HIV infection. This is called pre-exposure prophylaxis (PrEP). You are considered at risk if: ? You are sexually active and do not regularly use condoms or know the HIV status of your partner(s). ? You take drugs by injection. ? You are sexually active with a partner who has HIV.  Talk with your health care provider about whether you are at high risk of being infected with HIV. If you choose to begin PrEP, you should first be tested for HIV. You should then be tested every 3 months for as long as you are taking PrEP. Pregnancy  If you are premenopausal and you may become pregnant, ask your health care provider about preconception counseling.  If you may become pregnant, take 400 to 800 micrograms (mcg) of folic acid every day.  If you want to prevent pregnancy, talk to your health care provider about birth control (contraception). Osteoporosis and menopause  Osteoporosis is a disease in which the bones lose minerals and strength with aging. This can result in serious bone fractures. Your risk for osteoporosis can be identified using a bone density scan.  If you are 62 years of age or older, or if you are at risk for osteoporosis and fractures, ask your health care provider if you should be screened.  Ask your health care provider whether you should take a  calcium or vitamin D supplement to lower your risk for osteoporosis.  Menopause may have certain physical symptoms and risks.  Hormone replacement therapy may reduce some of these symptoms and risks. Talk to your health care provider about whether hormone replacement therapy is right for you. Follow these instructions at home:  Schedule regular health, dental, and eye exams.  Stay current with your immunizations.  Do not use any tobacco products including cigarettes, chewing tobacco, or electronic cigarettes.  If you are pregnant, do not drink alcohol.  If you are breastfeeding, limit how much and how often you drink alcohol.  Limit alcohol intake to no more than 1 drink per day for nonpregnant women. One drink equals 12 ounces of beer, 5 ounces of wine, or 1 ounces of hard liquor.  Do not use street drugs.  Do not share needles.  Ask your health care provider for help if you need support or information about quitting drugs.  Tell your health care provider if you often feel depressed.  Tell your health care provider if you have ever been abused or do not feel safe at home. This information is not intended to replace advice given to you by your health care provider. Make sure you discuss any questions you have with your health care provider. Document Released: 09/18/2010 Document Revised: 08/11/2015 Document Reviewed: 12/07/2014 Elsevier Interactive Patient Education  [employer].

## 2017-05-07 NOTE — Progress Notes (Signed)
AWV reviewed and agree.  Signed:  Phil Jarmaine Ehrler, MD           05/07/2017  

## 2017-06-06 ENCOUNTER — Telehealth: Payer: Self-pay

## 2017-06-06 NOTE — Telephone Encounter (Signed)
Copied from [place] [id number]. Topic: Appointment Scheduling - Scheduling Inquiry for Clinic >> Jun 06, 2017  2:04 PM Ether Griffins B wrote: Reason for CRM: pt would like Dr. Raoul Pitch to do her CPE. She states Maudie Mercury has already got approval for Dr. Raoul Pitch to do her physical she has contacted [employer] and they will cover the physical with Dr. Raoul Pitch but she would have to pay a bigger co pay.   During AWV ([place] pt), patient requested female provider to complete GYN exam. Patient stated she has no issues, but feels that being examined is important. Advised patient I would discuss with Dr. Raoul Pitch once she decided when she wanted to be scheduled.  Are you okay with performing GYN exam?

## 2017-06-07 NOTE — Telephone Encounter (Signed)
After speaking with Dr. Raoul Pitch, advised patient to contact GYN provider for exam. Patient verbalized understanding.

## 2017-06-07 NOTE — Telephone Encounter (Signed)
Kim please come talk to me about this when you get a chance.

## 2017-06-21 ENCOUNTER — Ambulatory Visit (INDEPENDENT_AMBULATORY_CARE_PROVIDER_SITE_OTHER): Payer: Medicare Other | Admitting: Family Medicine

## 2017-06-21 ENCOUNTER — Encounter: Payer: Self-pay | Admitting: Family Medicine

## 2017-06-21 VITALS — BP 119/66 | HR 74 | Temp 98.2°F | Resp 16 | Ht 60.0 in | Wt 135.0 lb

## 2017-06-21 DIAGNOSIS — S161XXA Strain of muscle, fascia and tendon at neck level, initial encounter: Secondary | ICD-10-CM

## 2017-06-21 DIAGNOSIS — J301 Allergic rhinitis due to pollen: Secondary | ICD-10-CM | POA: Diagnosis not present

## 2017-06-21 MED ORDER — FLUTICASONE PROPIONATE 50 MCG/ACT NA SUSP: 2.0000 | Freq: Every day | NASAL | 6 refills | Status: DC

## 2017-06-21 NOTE — Progress Notes (Signed)
OFFICE VISIT  06/21/2017   CC:  Chief Complaint  Patient presents with  . Ear Pain    right  . Allergies    ?     HPI:    Patient is a 84 y.o. Caucasian female who presents for right ear pain. Having at least 2 weeks of nasal congestion/runny nose, then last night she had generalized pain R ear, from behind R ear, and to R sided neck soft tissue.  No fevers.  Some slight cough.  NO ST.  No signif sneezing. Has some head/sinus congestion and feels PND. Had nose bleed last night and held her head in crooked position for a while while trying to get it to stop. No dizziness or HA.  Past Medical History:  Diagnosis Date  . Allergic state 04/30/2011  . Anxiety   . BCC (basal cell carcinoma), face 11/06/2010  . Bilateral bunions 11/06/2010  . Constipation 11/06/2010  . Coronary artery disease 2011   BMS x 3  . GERD (gastroesophageal reflux disease) 05/11/2012  . History of hyperkalemia 01/23/2011  . Hyperlipidemia    Intolerant of statins and zetia.  Refused to consider PCSK9 inhibitor initially but as of 02/2017 cardiol f/u she would consider repatha if approved.  . Hypertension   . Lesion of labia 02/04/2012  . Melanoma in situ [facility]) 08/2016   Derm= Dr. Renda Rolls [facility] [place].  . Osteoarthritis of both knees    R>L  . Osteopenia 11/23/2010; 02/2016   2017 T-score -1.9.  Repeat DEXA 2 yrs.  . Peripheral neuropathy 11/06/2010   Small fiber, non-diabetic per neurologist  . Right lumbar radiculopathy 01/2015   Improved with PT  . SCC (squamous cell carcinoma), arm 11/06/2010  . Seborrheic keratosis 04/30/2011  . Vertigo     Past Surgical History:  Procedure Laterality Date  . CARDIAC CATHETERIZATION  12/20/2009   3 bare metal stents  . DEXA  03/05/2016   T-score -1.9  . EYE SURGERY     cataract- left eye 09-24-11  . SKIN BIOPSY  08/2012   Shave excision of lesion on pt's back: irritated seb keratosis.  . TRANSTHORACIC ECHOCARDIOGRAM  12/05/2009   Mild LVH,  normal systolic fxn, mild impaired relaxation.  No signif valvular dz.  . TUBAL LIGATION      Outpatient Medications Prior to Visit  Medication Sig Dispense Refill  . ALPRAZolam (XANAX) 0.25 MG tablet Take 1 tablet (0.25 mg total) by mouth daily as needed. Panic attack 30 tablet 1  . amLODipine (NORVASC) 2.5 MG tablet TAKE ONE TABLET BY MOUTH ONCE DAILY 180 tablet 1  . aspirin 81 MG tablet Take 81 mg by mouth daily.    Marland Kitchen azelastine (OPTIVAR) 0.05 % ophthalmic solution INSTILL TWO DROPS INTO EACH EYE TWICE DAILY 6 mL 12  . Benfotiamine 150 MG CAPS Take 1 capsule by mouth daily. For numbness in feet    . Cyanocobalamin (VITAMIN B-12 CR PO) Take 1 tablet by mouth daily.     . enalapril (VASOTEC) 20 MG tablet TAKE 1 TABLET BY MOUTH IN THE MORNING AND TAKE 1/2 (ONE-HALF) TABLET AT BEDTIME 135 tablet 1  . magnesium oxide (MAG-OX) 400 MG tablet Take 400 mg by mouth once a week.     . metoprolol succinate (TOPROL-XL) 25 MG 24 hr tablet TAKE ONE TABLET BY MOUTH ONCE DAILY 90 tablet 1  . Multiple Vitamin (MULTIVITAMIN) tablet Take 1 tablet by mouth daily. Takes occ    . Naftifine HCl (NAFTIN) 2 %  GEL Apply to affected area qd-bid 60 g 3  . Omega-3 Fatty Acids (FISH OIL PO) Take 1 capsule by mouth daily.      No facility-administered medications prior to visit.     Allergies  Allergen Reactions  . Chlorthalidone     hyponatremia  . Atenolol     Caused fatigue   . Besivance [Besifloxacin Hcl]     rash  . Codeine Nausea And Vomiting  . Crestor [Rosuvastatin]     "leg gave away"  . Gabapentin     Memory loss and confusion  . Lipitor [Atorvastatin] Other (See Comments)    Severe leg pain  . Lovastatin     myalgia  . Simvastatin     Her leg gave away on her and states thinks secondary to Simvastatin  . Vigamox [Moxifloxacin]     rash  . Welchol [Colesevelam Hcl]     ROS As per HPI  PE: Blood pressure 119/66, pulse 74, temperature 98.2 F (36.8 C), temperature source Oral, resp.  rate 16, height 5' (1.524 m), weight 135 lb (61.2 kg), SpO2 98 %. VS: noted--normal. Gen: alert, NAD, NONTOXIC APPEARING. HEENT: eyes without injection, drainage, or swelling.  Ears: EACs clear, TMs with normal light reflex and landmarks.  No ear tenderness or erythema.  Nose: Scant rhinorrhea, with some dried, crusty exudate adherent to mildly injected mucosa.  No blood. No purulent d/c.  No paranasal sinus TTP.  No facial swelling.  Throat and mouth without focal lesion.  No pharyngial swelling, erythema, or exudate.   Neck: mild TTP in right sided soft tissues--some muscle tightness noted on this side, with mild impairment of ROM of neck due to this.  No LAD.  No erythema.  Carotids 2+ bilat.   LABS:    Chemistry      Component Value Date/Time   NA 135 01/30/2017 0904   K 4.7 01/30/2017 0904   CL 97 01/30/2017 0904   CO2 30 01/30/2017 0904   BUN 13 01/30/2017 0904   CREATININE 0.70 01/30/2017 0904   CREATININE 0.78 06/06/2010 1745      Component Value Date/Time   CALCIUM 9.9 01/30/2017 0904   ALKPHOS 52 07/30/2016 1054   AST 10 07/30/2016 1054   ALT 13 07/30/2016 1054   BILITOT 0.6 07/30/2016 1054      IMPRESSION AND PLAN:  Allergic rhinitis: continue saline nasal spray. Add flonase--rx'd today. She doesn't tolerate antihistamines. I think she may have held her neck in a crooked/odd position for 10 min or so while holding tissue to her nose during nosebleed last evening, and this has caused some R cervical spine mm tenderness and decreased ROM. Encouraged heat to the area.  She does not tolerate tylenol ("foggy"). ROM exercises encouraged.  An After Visit Summary was printed and given to the patient.  FOLLOW UP: Return if symptoms worsen or fail to improve.  Signed:  Crissie Sickles, MD           06/21/2017

## 2017-07-07 ENCOUNTER — Other Ambulatory Visit: Payer: Self-pay | Admitting: Family Medicine

## 2017-07-24 ENCOUNTER — Encounter: Payer: Self-pay | Admitting: Family Medicine

## 2017-07-24 ENCOUNTER — Ambulatory Visit (INDEPENDENT_AMBULATORY_CARE_PROVIDER_SITE_OTHER): Payer: Medicare Other | Admitting: Family Medicine

## 2017-07-24 VITALS — BP 136/78 | HR 61 | Temp 98.5°F | Resp 16 | Ht 60.0 in | Wt 132.2 lb

## 2017-07-24 DIAGNOSIS — I10 Essential (primary) hypertension: Secondary | ICD-10-CM

## 2017-07-24 DIAGNOSIS — G609 Hereditary and idiopathic neuropathy, unspecified: Secondary | ICD-10-CM | POA: Diagnosis not present

## 2017-07-24 DIAGNOSIS — E663 Overweight: Secondary | ICD-10-CM

## 2017-07-24 DIAGNOSIS — E7800 Pure hypercholesterolemia, unspecified: Secondary | ICD-10-CM | POA: Diagnosis not present

## 2017-07-24 LAB — BASIC METABOLIC PANEL
BUN: 12 mg/dL (ref 6–23)
CALCIUM: 9.4 mg/dL (ref 8.4–10.5)
CO2: 25 meq/L (ref 19–32)
Chloride: 98 mEq/L (ref 96–112)
Creatinine, Ser: 0.73 mg/dL (ref 0.40–1.20)
GFR: 79.13 mL/min (ref >60.00)
GLUCOSE: 100 mg/dL — AB (ref 70–99)
Potassium: 4.7 mEq/L (ref 3.5–5.1)
SODIUM: 133 meq/L — AB (ref 135–145)

## 2017-07-24 LAB — MAGNESIUM: Magnesium: 2.1 mg/dL (ref 1.5–2.5)

## 2017-07-24 LAB — VITAMIN B12: Vitamin B-12: 711 pg/mL (ref 211–911)

## 2017-07-24 NOTE — Progress Notes (Signed)
OFFICE VISIT  07/24/2017   CC:  Chief Complaint  Patient presents with  . Follow-up    RCI, pt is fasting.    HPI:    Patient is a 84 y.o. Caucasian female who presents for 6 mo f/u HTN, HLD with hx of statin intolerance (declines any further trials of chol med), and GAD with panic attacks--requires prn alprazolam.  HLD: Concentrating on diet: avoiding red meat, exercising daily 15 min of flexing, does silver sneakers 3 hours per week, tai chi 1 hour per week.  HTN: home bp's 130s/70s, HR 70s.   C/o chronic mild bilat plantar burning c/w periph neuropathy pain.  Intermittent hands numbness as well.  ROS: No CP, no SOB, no palpitations, no dizziness, LE swelling. No focal weakness.    Past Medical History:  Diagnosis Date  . Allergic state 04/30/2011  . Anxiety   . BCC (basal cell carcinoma), face 11/06/2010  . Bilateral bunions 11/06/2010  . Constipation 11/06/2010  . Coronary artery disease 2011   BMS x 3  . GERD (gastroesophageal reflux disease) 05/11/2012  . History of hyperkalemia 01/23/2011  . Hyperlipidemia    Intolerant of statins and zetia.  Refused to consider PCSK9 inhibitor initially but as of 02/2017 cardiol f/u she would consider repatha if approved.  . Hypertension   . Lesion of labia 02/04/2012  . Melanoma in situ [facility]) 08/2016   Derm= Dr. Renda Rolls @ [facility] in [place].  . Osteoarthritis of both knees    R>L  . Osteopenia 11/23/2010; 02/2016   2017 T-score -1.9.  Repeat DEXA 2 yrs.  . Peripheral neuropathy 11/06/2010   Small fiber, non-diabetic per neurologist  . Right lumbar radiculopathy 01/2015   Improved with PT  . SCC (squamous cell carcinoma), arm 11/06/2010  . Seborrheic keratosis 04/30/2011  . Vertigo     Past Surgical History:  Procedure Laterality Date  . CARDIAC CATHETERIZATION  12/20/2009   3 bare metal stents  . DEXA  03/05/2016   T-score -1.9  . EYE SURGERY     cataract- left eye 09-24-11  . SKIN BIOPSY  08/2012   Shave  excision of lesion on pt's back: irritated seb keratosis.  . TRANSTHORACIC ECHOCARDIOGRAM  12/05/2009   Mild LVH, normal systolic fxn, mild impaired relaxation.  No signif valvular dz.  . TUBAL LIGATION      Outpatient Medications Prior to Visit  Medication Sig Dispense Refill  . ALPRAZolam (XANAX) 0.25 MG tablet Take 1 tablet (0.25 mg total) by mouth daily as needed. Panic attack 30 tablet 1  . amLODipine (NORVASC) 2.5 MG tablet TAKE ONE TABLET BY MOUTH ONCE DAILY 180 tablet 1  . aspirin 81 MG tablet Take 81 mg by mouth daily.    Marland Kitchen azelastine (OPTIVAR) 0.05 % ophthalmic solution INSTILL TWO DROPS INTO EACH EYE TWICE DAILY 6 mL 12  . Benfotiamine 150 MG CAPS Take 1 capsule by mouth daily. For numbness in feet    . Cyanocobalamin (VITAMIN B-12 CR PO) Take 1 tablet by mouth daily.     . enalapril (VASOTEC) 20 MG tablet TAKE 1 TABLET BY MOUTH IN THE MORNING AND 1/2 (ONE-HALF) AT BEDTIME 135 tablet 1  . magnesium oxide (MAG-OX) 400 MG tablet Take 400 mg by mouth once a week.     . metoprolol succinate (TOPROL-XL) 25 MG 24 hr tablet TAKE ONE TABLET BY MOUTH ONCE DAILY 90 tablet 1  . Multiple Vitamin (MULTIVITAMIN) tablet Take 1 tablet by mouth daily. Takes occ    .  Naftifine HCl (NAFTIN) 2 % GEL Apply to affected area qd-bid 60 g 3  . Omega-3 Fatty Acids (FISH OIL PO) Take 1 capsule by mouth daily.     . fluticasone (FLONASE) 50 MCG/ACT nasal spray Place 2 sprays into both nostrils daily. (Patient not taking: Reported on 07/24/2017) 16 g 6   No facility-administered medications prior to visit.     Allergies  Allergen Reactions  . Chlorthalidone     hyponatremia  . Atenolol     Caused fatigue   . Besivance [Besifloxacin Hcl]     rash  . Codeine Nausea And Vomiting  . Crestor [Rosuvastatin]     "leg gave away"  . Gabapentin     Memory loss and confusion  . Lipitor [Atorvastatin] Other (See Comments)    Severe leg pain  . Lovastatin     myalgia  . Simvastatin     Her leg gave away on  her and states thinks secondary to Simvastatin  . Sulfa Antibiotics Nausea Only  . Vigamox [Moxifloxacin]     rash  . Welchol [Colesevelam Hcl]     ROS As per HPI  PE: Blood pressure 136/78, pulse 61, temperature 98.5 F (36.9 C), temperature source Oral, resp. rate 16, height 5' (1.524 m), weight 132 lb 4 oz (60 kg), SpO2 97 %. Gen: Alert, well appearing.  Patient is oriented to person, place, time, and situation. AFFECT: pleasant, lucid thought and speech. CV: RRR, occ premature beat.  No m/r/g.   LUNGS: CTA bilat, nonlabored resps, good aeration in all lung fields. Ext: no clubbing or cyanosis.  Trace bilat LE pitting.  LABS:   Lab Results  Component Value Date   TSH 1.36 05/07/2012   Lab Results  Component Value Date   WBC 5.0 05/02/2015   HGB 14.2 05/02/2015   HCT 42.1 05/02/2015   MCV 94.4 05/02/2015   PLT 172 05/02/2015   Lab Results  Component Value Date   CREATININE 0.70 01/30/2017   BUN 13 01/30/2017   NA 135 01/30/2017   K 4.7 01/30/2017   CL 97 01/30/2017   CO2 30 01/30/2017   Lab Results  Component Value Date   ALT 13 07/30/2016   AST 10 07/30/2016   ALKPHOS 52 07/30/2016   BILITOT 0.6 07/30/2016   Lab Results  Component Value Date   CHOL 232 (H) 01/30/2017   Lab Results  Component Value Date   HDL 68.80 01/30/2017   Lab Results  Component Value Date   LDLCALC 144 (H) 01/30/2017   Lab Results  Component Value Date   TRIG 98.0 01/30/2017   Lab Results  Component Value Date   CHOLHDL 3 01/30/2017   Lab Results  Component Value Date   HGBA1C  09/03/2009    5.4 (NOTE)                                                                       According to the ADA Clinical Practice Recommendations for 2011, when HbA1c is used as a screening test:   >=6.5%   Diagnostic of Diabetes Mellitus           (if abnormal result  is confirmed)  5.7-6.4%   Increased risk of developing Diabetes Mellitus  References:Diagnosis and Classification of  Diabetes Mellitus,Diabetes [id number],2694,85(IOEVO 1):S62-S69 and Standards of Medical Care in         Diabetes - 2011,Diabetes JJKK,9381,82  (Suppl 1):S11-S61.   Lab Results  Component Value Date   [id number] 05/07/2012    IMPRESSION AND PLAN:  1) HTN: The current medical regimen is effective;  continue present plan and medications. Lytes/cr today.  2) Hypercholesterolemia: diet and exercise changes---pt intol of all cholest meds and refuses any further med trials. Recheck FLP today.  3) Periph neurop pain: no hx DM/IFG.  Neuro w/u 2012--small fiber, non-diabetic. Check Vit B12 today. Recheck magnesium.  An After Visit Summary was printed and given to the patient.  FOLLOW UP: Return in about 6 months (around 01/24/2018) for routine chronic illness f/u.  Signed:  Crissie Sickles, MD           07/24/2017

## 2017-09-08 ENCOUNTER — Other Ambulatory Visit: Payer: Self-pay | Admitting: Family Medicine

## 2017-09-09 NOTE — Telephone Encounter (Signed)
RF request for metoprolol LOV: 07/24/17 Next ov: 05/12/18 Last written: 03/04/17 #90 w/ 1RF

## 2017-10-06 ENCOUNTER — Other Ambulatory Visit: Payer: Self-pay | Admitting: Family Medicine

## 2017-10-22 ENCOUNTER — Ambulatory Visit: Payer: Self-pay | Admitting: *Deleted

## 2017-10-22 NOTE — Telephone Encounter (Signed)
Pt has apt w/ Dr. Anitra Lauth on 10/23/17 at 11:45am.

## 2017-10-22 NOTE — Telephone Encounter (Signed)
Noted  

## 2017-10-22 NOTE — Telephone Encounter (Signed)
Pt called with having some dizziness after getting up this morning. She states some nausea, but able to eat a little something this morning.  She thinks this may be her b/p but unable to check because her machine is not working. She went ahead and took her b/p meds at 7. She also took a half of 0.25 mg xanax at 8:09 this morning. Denies any new weakness or headache. She seems to be having some problem remembering. She is alone but can call her daughter to come and check her b/p. Advised her to call her daughter and I will give her a call back to get more information.   Answer Assessment - Initial Assessment Questions 1. NAUSEA SEVERITY: "How bad is the nausea?" (e.g., mild, moderate, severe; dehydration, weight loss)   - MILD: loss of appetite without change in eating habits   - MODERATE: decreased oral intake without significant weight loss, dehydration, or malnutrition   - SEVERE: inadequate caloric or fluid intake, significant weight loss, symptoms of dehydration     mild 2. ONSET: "When did the nausea begin?"     today 3. VOMITING: "Any vomiting?" If so, ask: "How many times today?"     no 4. RECURRENT SYMPTOM: "Have you had nausea before?" If so, ask: "When was the last time?" "What happened that time?"     no 5. CAUSE: "What do you think is causing the nausea?"     Not sure, maybe the b/p  Protocols used: NAUSEA-A-AH

## 2017-10-22 NOTE — Telephone Encounter (Signed)
Called patient back and she stated that her daughter was not there but she was feeling better. She had had some water and she thinks that that helped. She did have a total of 3 stools today but it is not diarrhea.  Brown in color. Denies abd cramping. Just feels a little nauseous at times but able to drink.  Will get a b/p monitor and call back with the readings if her b/p is elevated.  Will route to flow and notify them at [facility]. Appointment scheduled for tomorrow.

## 2017-10-23 ENCOUNTER — Encounter: Payer: Self-pay | Admitting: Family Medicine

## 2017-10-23 ENCOUNTER — Ambulatory Visit (INDEPENDENT_AMBULATORY_CARE_PROVIDER_SITE_OTHER): Payer: Medicare Other | Admitting: Family Medicine

## 2017-10-23 VITALS — BP 134/76 | HR 69 | Temp 98.2°F | Resp 16 | Ht 60.0 in | Wt 130.5 lb

## 2017-10-23 DIAGNOSIS — I10 Essential (primary) hypertension: Secondary | ICD-10-CM | POA: Diagnosis not present

## 2017-10-23 DIAGNOSIS — R110 Nausea: Secondary | ICD-10-CM

## 2017-10-23 DIAGNOSIS — R42 Dizziness and giddiness: Secondary | ICD-10-CM

## 2017-10-23 LAB — CBC WITH DIFFERENTIAL/PLATELET
BASOS ABS: 0 10*3/uL (ref 0.0–0.1)
Basophils Relative: 0.5 % (ref 0.0–3.0)
EOS ABS: 0.1 10*3/uL (ref 0.0–0.7)
Eosinophils Relative: 0.9 % (ref 0.0–5.0)
HEMATOCRIT: 44.2 % (ref 36.0–46.0)
Hemoglobin: 14.8 g/dL (ref 12.0–15.0)
LYMPHS ABS: 2.1 10*3/uL (ref 0.7–4.0)
LYMPHS PCT: 34.8 % (ref 12.0–46.0)
MCHC: 33.5 g/dL (ref 30.0–36.0)
MCV: 95 fl (ref 78.0–100.0)
MONOS PCT: 10.5 % (ref 3.0–12.0)
Monocytes Absolute: 0.6 10*3/uL (ref 0.1–1.0)
NEUTROS PCT: 53.3 % (ref 43.0–77.0)
Neutro Abs: 3.2 10*3/uL (ref 1.4–7.7)
Platelets: 177 10*3/uL (ref 150.0–400.0)
RBC: 4.65 Mil/uL (ref 3.87–5.11)
RDW: 13.5 % (ref 11.5–15.5)
WBC: 6 10*3/uL (ref 4.0–10.5)

## 2017-10-23 LAB — COMPREHENSIVE METABOLIC PANEL
ALT: 16 U/L (ref 0–35)
AST: 10 U/L (ref 0–37)
Albumin: 4.6 g/dL (ref 3.5–5.2)
Alkaline Phosphatase: 47 U/L (ref 39–117)
BUN: 15 mg/dL (ref 6–23)
CO2: 31 meq/L (ref 19–32)
Calcium: 10.2 mg/dL (ref 8.4–10.5)
Chloride: 97 mEq/L (ref 96–112)
Creatinine, Ser: 0.79 mg/dL (ref 0.40–1.20)
GFR: 72.19 mL/min (ref >60.00)
Glucose, Bld: 84 mg/dL (ref 70–99)
Potassium: 5.3 mEq/L — ABNORMAL HIGH (ref 3.5–5.1)
Sodium: 135 mEq/L (ref 135–145)
TOTAL PROTEIN: 7.2 g/dL (ref 6.0–8.3)
Total Bilirubin: 0.6 mg/dL (ref 0.2–1.2)

## 2017-10-23 NOTE — Progress Notes (Signed)
OFFICE VISIT  10/23/2017   CC:  Chief Complaint  Patient presents with  . Dizziness  . Nausea  . Bowel Movements    had multiple BMs in one day   HPI:    Patient is a 84 y.o. Caucasian female who presents for concern of multiple BMs yesterday. Usually has one BM after her morning coffee. Yesterday she had BM as usual, formed, got a little dizzy after she went to her bed to do her exercises and watch TV. She is unable to adequately describe her dizziness in such a way as to be able to discern lightheadedness from vertigo, but it does seem like she describes a sensation of her TV "flipping over and over"--somewhat a vertiginous sensation, not simply lightheaded.  She estimates the dizziness lasted about 2 hours--more intense at first.  No focal weakness, no chest pain.   Felt mild nausea throughout her day, no vomiting.  When she laid back she would feel upset stomach a little worse.  No abdominal pains at all.  No dysuria or unusual urgency or frequency.   Had 3 more formed stools later in the day.  No blood or pus in stool.  BPs normal during all of this.   She felt like she had adequate evacuation with each BM. Eating lots of fresh veggies and fruit lately---more than what she would typically eat.  Says she has been focusing on hydrating better the last 1 d. The rest of the day she had no further dizziness.  Today she woke up and felt normal, has eaten.   She did have one bm as per her normal today.  Otherwise feeling fine. No dizziness today.  Vision normal today.  Some recent extra stress with scalp seborrhea and bilat feet pain chronically.   Denies any recent hx of feeling constipated.   Past Medical History:  Diagnosis Date  . Allergic state 04/30/2011  . Anxiety   . BCC (basal cell carcinoma), face 11/06/2010  . Bilateral bunions 11/06/2010  . Constipation 11/06/2010  . Coronary artery disease 2011   BMS x 3  . GERD (gastroesophageal reflux disease) 05/11/2012  . History of  hyperkalemia 01/23/2011  . Hyperlipidemia    Intolerant of statins and zetia.  Refused to consider PCSK9 inhibitor initially but as of 02/2017 cardiol f/u she would consider repatha if approved.  . Hypertension   . Lesion of labia 02/04/2012  . Melanoma in situ [facility]) 08/2016   Derm= Dr. Renda Rolls @ [facility] in [place].  . Osteoarthritis of both knees    R>L  . Osteopenia 11/23/2010; 02/2016   2017 T-score -1.9.  Repeat DEXA 2 yrs.  . Peripheral neuropathy 11/06/2010   Small fiber, non-diabetic per neurologist  . Right lumbar radiculopathy 01/2015   Improved with PT  . SCC (squamous cell carcinoma), arm 11/06/2010  . Seborrheic keratosis 04/30/2011  . Vertigo     Past Surgical History:  Procedure Laterality Date  . CARDIAC CATHETERIZATION  12/20/2009   3 bare metal stents  . DEXA  03/05/2016   T-score -1.9  . EYE SURGERY     cataract- left eye 09-24-11  . SKIN BIOPSY  08/2012   Shave excision of lesion on pt's back: irritated seb keratosis.  . TRANSTHORACIC ECHOCARDIOGRAM  12/05/2009   Mild LVH, normal systolic fxn, mild impaired relaxation.  No signif valvular dz.  . TUBAL LIGATION      Outpatient Medications Prior to Visit  Medication Sig Dispense Refill  . ALPRAZolam Duanne Moron)  0.25 MG tablet Take 1 tablet (0.25 mg total) by mouth daily as needed. Panic attack 30 tablet 1  . amLODipine (NORVASC) 2.5 MG tablet TAKE 1 TABLET BY MOUTH ONCE DAILY 180 tablet 1  . aspirin 81 MG tablet Take 81 mg by mouth daily.    Marland Kitchen azelastine (OPTIVAR) 0.05 % ophthalmic solution INSTILL TWO DROPS INTO EACH EYE TWICE DAILY 6 mL 12  . Benfotiamine 150 MG CAPS Take 1 capsule by mouth daily. For numbness in feet    . Cyanocobalamin (VITAMIN B-12 CR PO) Take 1 tablet by mouth daily.     . enalapril (VASOTEC) 20 MG tablet TAKE 1 TABLET BY MOUTH IN THE MORNING AND 1/2 (ONE-HALF) AT BEDTIME 135 tablet 1  . magnesium oxide (MAG-OX) 400 MG tablet Take 400 mg by mouth once a week.     . metoprolol  succinate (TOPROL-XL) 25 MG 24 hr tablet TAKE 1 TABLET BY MOUTH ONCE DAILY 90 tablet 1  . Multiple Vitamin (MULTIVITAMIN) tablet Take 1 tablet by mouth daily. Takes occ    . Naftifine HCl (NAFTIN) 2 % GEL Apply to affected area qd-bid 60 g 3  . Omega-3 Fatty Acids (FISH OIL PO) Take 1 capsule by mouth daily.     Marland Kitchen ketoconazole (NIZORAL) 2 % shampoo      No facility-administered medications prior to visit.     Allergies  Allergen Reactions  . Chlorthalidone     hyponatremia  . Atenolol     Caused fatigue   . Besivance [Besifloxacin Hcl]     rash  . Codeine Nausea And Vomiting  . Crestor [Rosuvastatin]     "leg gave away"  . Gabapentin     Memory loss and confusion  . Lipitor [Atorvastatin] Other (See Comments)    Severe leg pain  . Lovastatin     myalgia  . Simvastatin     Her leg gave away on her and states thinks secondary to Simvastatin  . Sulfa Antibiotics Nausea Only  . Vigamox [Moxifloxacin]     rash  . Welchol [Colesevelam Hcl]     ROS As per HPI  PE: Blood pressure 134/76, pulse 69, temperature 98.2 F (36.8 C), temperature source Oral, resp. rate 16, height 5' (1.524 m), weight 130 lb 8 oz (59.2 kg), SpO2 96 %.  Orthostatics today: Supine 146/84, P 68 Upright 140/82, P 69 , standing 140/76, P 70 Exam chaperoned by Starla Link, CMA.  Gen: Alert, well appearing.  Patient is oriented to person, place, time, and situation. AFFECT: pleasant, lucid thought and speech. HMC:NOBS: no injection, icteris, swelling, or exudate.  EOMI, PERRLA. Mouth: lips without lesion/swelling.  Oral mucosa pink and moist. Oropharynx without erythema, exudate, or swelling.  Neck - No masses or thyromegaly or limitation in range of motion CV: RRR, no m/r/g.   LUNGS: CTA bilat, nonlabored resps, good aeration in all lung fields. ABD: soft, NT, ND, BS normal.  No hepatospenomegaly or mass.  No bruits. EXT: no pitting edema, no cyanosis. SKIN: no pallor or jaundice.   LABS:  Lab Results   Component Value Date   TSH 1.36 05/07/2012   Lab Results  Component Value Date   WBC 5.0 05/02/2015   HGB 14.2 05/02/2015   HCT 42.1 05/02/2015   MCV 94.4 05/02/2015   PLT 172 05/02/2015   Lab Results  Component Value Date   CREATININE 0.73 07/24/2017   BUN 12 07/24/2017   NA 133 (L) 07/24/2017   K 4.7 07/24/2017  CL 98 07/24/2017   CO2 25 07/24/2017   Lab Results  Component Value Date   ALT 13 07/30/2016   AST 10 07/30/2016   ALKPHOS 52 07/30/2016   BILITOT 0.6 07/30/2016   Lab Results  Component Value Date   CHOL 232 (H) 01/30/2017   Lab Results  Component Value Date   HDL 68.80 01/30/2017   Lab Results  Component Value Date   LDLCALC 144 (H) 01/30/2017   Lab Results  Component Value Date   TRIG 98.0 01/30/2017   Lab Results  Component Value Date   CHOLHDL 3 01/30/2017   Lab Results  Component Value Date   HGBA1C  09/03/2009    5.4 (NOTE)                                                                       According to the ADA Clinical Practice Recommendations for 2011, when HbA1c is used as a screening test:   >=6.5%   Diagnostic of Diabetes Mellitus           (if abnormal result  is confirmed)  5.7-6.4%   Increased risk of developing Diabetes Mellitus  References:Diagnosis and Classification of Diabetes Mellitus,Diabetes DPOE,4235,36(RWERX 1):S62-S69 and Standards of Medical Care in         Diabetes - 2011,Diabetes Care,2011,34  (Suppl 1):S11-S61.     IMPRESSION AND PLAN:  1) Vertigo: this whole episode yesterday is tough to clearly figure out. Sounds like she may have had an episode of vertigo (positional?), then had somewhat of an exaggerated GI response to the episode +anxiety.   Today she is completely normal.  Orthostatics normal. Check CBC and CMET today to take a prudent approach. Watchful waiting approach. Continue to work on good clear fluid intake, not eat too much fiber or take too much fiber supplement, and monitor home bp's.  Her bp  cuff read MUCH higher here today than our manual bp cuff.  She will need to get new bp cuff.  Spent 30 min with pt today, with >50% of this time spent in counseling and care coordination regarding the above problems.  An After Visit Summary was printed and given to the patient.  FOLLOW UP: Return if symptoms worsen or fail to improve.  Signed:  Crissie Sickles, MD           10/23/2017

## 2017-10-24 ENCOUNTER — Ambulatory Visit (INDEPENDENT_AMBULATORY_CARE_PROVIDER_SITE_OTHER): Payer: Medicare Other | Admitting: Podiatry

## 2017-10-24 ENCOUNTER — Other Ambulatory Visit: Payer: Self-pay | Admitting: *Deleted

## 2017-10-24 ENCOUNTER — Encounter: Payer: Self-pay | Admitting: Podiatry

## 2017-10-24 DIAGNOSIS — G589 Mononeuropathy, unspecified: Secondary | ICD-10-CM | POA: Insufficient documentation

## 2017-10-24 DIAGNOSIS — B351 Tinea unguium: Secondary | ICD-10-CM | POA: Diagnosis not present

## 2017-10-24 DIAGNOSIS — M2041 Other hammer toe(s) (acquired), right foot: Secondary | ICD-10-CM

## 2017-10-24 DIAGNOSIS — M79676 Pain in unspecified toe(s): Principal | ICD-10-CM

## 2017-10-24 DIAGNOSIS — I2510 Atherosclerotic heart disease of native coronary artery without angina pectoris: Secondary | ICD-10-CM | POA: Insufficient documentation

## 2017-10-24 DIAGNOSIS — M79674 Pain in right toe(s): Secondary | ICD-10-CM

## 2017-10-24 DIAGNOSIS — Q828 Other specified congenital malformations of skin: Secondary | ICD-10-CM

## 2017-10-24 DIAGNOSIS — E875 Hyperkalemia: Secondary | ICD-10-CM

## 2017-10-24 DIAGNOSIS — M79675 Pain in left toe(s): Secondary | ICD-10-CM

## 2017-10-24 DIAGNOSIS — M2042 Other hammer toe(s) (acquired), left foot: Secondary | ICD-10-CM

## 2017-10-24 NOTE — Progress Notes (Signed)
Subjective:  Patient ID: Jennifer Peck, female    DOB: 07-02-1922,  MRN: [id number] HPI Chief Complaint  Patient presents with  . Foot Pain    Multiple concerns - 1) corns/calluses between 2/3 left, tip of 3rd right, plantar forefoot bilateral  2) requesting nail care  3) says bottoms of her feet burn and makes her right leg hurt  4) requesting Rx for shoes and insoles    84 y.o. female presents with the above complaint.   ROS: Denies fever chills nausea vomiting muscle aches pains calf pain back pain chest pain shortness of breath.  Past Medical History:  Diagnosis Date  . Allergic state 04/30/2011  . Anxiety   . BCC (basal cell carcinoma), face 11/06/2010  . Bilateral bunions 11/06/2010  . Constipation 11/06/2010  . Coronary artery disease 2011   BMS x 3  . GERD (gastroesophageal reflux disease) 05/11/2012  . History of hyperkalemia 01/23/2011  . Hyperlipidemia    Intolerant of statins and zetia.  Refused to consider PCSK9 inhibitor initially but as of 02/2017 cardiol f/u she would consider repatha if approved.  . Hypertension   . Lesion of labia 02/04/2012  . Melanoma in situ [facility]) 08/2016   Derm= Dr. Renda Rolls @ [facility] in [place].  . Osteoarthritis of both knees    R>L  . Osteopenia 11/23/2010; 02/2016   2017 T-score -1.9.  Repeat DEXA 2 yrs.  . Peripheral neuropathy 11/06/2010   Small fiber, non-diabetic per neurologist  . Right lumbar radiculopathy 01/2015   Improved with PT  . SCC (squamous cell carcinoma), arm 11/06/2010  . Seborrheic keratosis 04/30/2011  . Vertigo    Past Surgical History:  Procedure Laterality Date  . CARDIAC CATHETERIZATION  12/20/2009   3 bare metal stents  . DEXA  03/05/2016   T-score -1.9  . EYE SURGERY     cataract- left eye 09-24-11  . SKIN BIOPSY  08/2012   Shave excision of lesion on pt's back: irritated seb keratosis.  . TRANSTHORACIC ECHOCARDIOGRAM  12/05/2009   Mild LVH, normal systolic fxn, mild impaired relaxation.  No  signif valvular dz.  . TUBAL LIGATION      Current Outpatient Medications:  .  ALPRAZolam (XANAX) 0.25 MG tablet, Take 1 tablet (0.25 mg total) by mouth daily as needed. Panic attack, Disp: 30 tablet, Rfl: 1 .  amLODipine (NORVASC) 2.5 MG tablet, TAKE 1 TABLET BY MOUTH ONCE DAILY, Disp: 180 tablet, Rfl: 1 .  aspirin 81 MG tablet, Take 81 mg by mouth daily., Disp: , Rfl:  .  azelastine (OPTIVAR) 0.05 % ophthalmic solution, INSTILL TWO DROPS INTO EACH EYE TWICE DAILY, Disp: 6 mL, Rfl: 12 .  Benfotiamine 150 MG CAPS, Take 1 capsule by mouth daily. For numbness in feet, Disp: , Rfl:  .  Cyanocobalamin (VITAMIN B-12 CR PO), Take 1 tablet by mouth daily. , Disp: , Rfl:  .  enalapril (VASOTEC) 20 MG tablet, TAKE 1 TABLET BY MOUTH IN THE MORNING AND 1/2 (ONE-HALF) AT BEDTIME, Disp: 135 tablet, Rfl: 1 .  ketoconazole (NIZORAL) 2 % shampoo, , Disp: , Rfl:  .  magnesium oxide (MAG-OX) 400 MG tablet, Take 400 mg by mouth once a week. , Disp: , Rfl:  .  metoprolol succinate (TOPROL-XL) 25 MG 24 hr tablet, TAKE 1 TABLET BY MOUTH ONCE DAILY, Disp: 90 tablet, Rfl: 1 .  Multiple Vitamin (MULTIVITAMIN) tablet, Take 1 tablet by mouth daily. Takes occ, Disp: , Rfl:  .  Naftifine HCl (NAFTIN) 2 %  GEL, Apply to affected area qd-bid, Disp: 60 g, Rfl: 3 .  Omega-3 Fatty Acids (FISH OIL PO), Take 1 capsule by mouth daily. , Disp: , Rfl:   Allergies  Allergen Reactions  . Chlorthalidone     hyponatremia  . Atenolol     Caused fatigue   . Besivance [Besifloxacin Hcl]     rash  . Codeine Nausea And Vomiting  . Crestor [Rosuvastatin]     "leg gave away"  . Gabapentin     Memory loss and confusion  . Lipitor [Atorvastatin] Other (See Comments)    Severe leg pain  . Lovastatin     myalgia  . Simvastatin     Her leg gave away on her and states thinks secondary to Simvastatin  . Sulfa Antibiotics Nausea Only  . Vigamox [Moxifloxacin]     rash  . Welchol [Colesevelam Hcl]    Review of Systems Objective:    There were no vitals filed for this visit.  General: Well developed, nourished, in no acute distress, alert and oriented x3   Dermatological: Skin is warm, dry and supple bilateral. Nails x 10 are well maintained; remaining integument appears unremarkable at this time. There are no open sores, no preulcerative lesions, no rash or signs of infection present.  Vascular: Dorsalis Pedis artery and Posterior Tibial artery pedal pulses are 2/4 bilateral with immedate capillary fill time. Pedal hair growth present. No varicosities and no lower extremity edema present bilateral.   Neruologic: Grossly intact via light touch bilateral. Vibratory intact via tuning fork bilateral. Protective threshold with Semmes Wienstein monofilament intact to all pedal sites bilateral. Patellar and Achilles deep tendon reflexes 2+ bilateral. No Babinski or clonus noted bilateral.   Musculoskeletal: No gross boney pedal deformities bilateral. No pain, crepitus, or limitation noted with foot and ankle range of motion bilateral. Muscular strength 5/5 in all groups tested bilateral.  Severe hammertoe deformity to be deformities bilateral resulting in reactive hyper keratomas bilateral foot.  Gait: Unassisted, Nonantalgic.    Radiographs:  None taken  Assessment & Plan:   Assessment: Severe foot deformity hammertoe deformities with hallux valgus deformities bilateral.  Resulting in multiple areas of porokeratosis.  Painful elongated toenails 1 through 5 bilateral secondary to onychomycosis.    Plan: Discussed etiology pathology conservative versus surgical therapies at this point debrided all reactive hyperkeratoses debrided toenails 1 through 5 bilateral discussed possible need for surgery.  Also she was scanned for orthotics today and provided orders to go to a shoe store for a particular shoe style.     Max T. Gomer, [place]

## 2017-11-01 ENCOUNTER — Other Ambulatory Visit (INDEPENDENT_AMBULATORY_CARE_PROVIDER_SITE_OTHER): Payer: Medicare Other

## 2017-11-01 DIAGNOSIS — E875 Hyperkalemia: Secondary | ICD-10-CM

## 2017-11-01 LAB — BASIC METABOLIC PANEL
BUN: 14 mg/dL (ref 6–23)
CALCIUM: 9.9 mg/dL (ref 8.4–10.5)
CO2: 31 mEq/L (ref 19–32)
CREATININE: 0.77 mg/dL (ref 0.40–1.20)
Chloride: 99 mEq/L (ref 96–112)
GFR: 74.36 mL/min (ref >60.00)
Glucose, Bld: 95 mg/dL (ref 70–99)
Potassium: 4.8 mEq/L (ref 3.5–5.1)
Sodium: 136 mEq/L (ref 135–145)

## 2017-11-07 ENCOUNTER — Ambulatory Visit: Payer: Medicare Other | Admitting: Cardiovascular Disease

## 2017-11-13 ENCOUNTER — Ambulatory Visit (INDEPENDENT_AMBULATORY_CARE_PROVIDER_SITE_OTHER): Payer: Medicare Other | Admitting: Orthotics

## 2017-11-13 ENCOUNTER — Ambulatory Visit (INDEPENDENT_AMBULATORY_CARE_PROVIDER_SITE_OTHER): Payer: Medicare Other | Admitting: Cardiovascular Disease

## 2017-11-13 ENCOUNTER — Encounter: Payer: Self-pay | Admitting: Cardiovascular Disease

## 2017-11-13 VITALS — BP 140/80 | HR 78 | Ht 60.0 in | Wt 131.0 lb

## 2017-11-13 DIAGNOSIS — I493 Ventricular premature depolarization: Secondary | ICD-10-CM

## 2017-11-13 DIAGNOSIS — M2041 Other hammer toe(s) (acquired), right foot: Secondary | ICD-10-CM

## 2017-11-13 DIAGNOSIS — I2510 Atherosclerotic heart disease of native coronary artery without angina pectoris: Secondary | ICD-10-CM

## 2017-11-13 DIAGNOSIS — B351 Tinea unguium: Secondary | ICD-10-CM

## 2017-11-13 DIAGNOSIS — I119 Hypertensive heart disease without heart failure: Secondary | ICD-10-CM

## 2017-11-13 DIAGNOSIS — E7800 Pure hypercholesterolemia, unspecified: Secondary | ICD-10-CM

## 2017-11-13 DIAGNOSIS — M79676 Pain in unspecified toe(s): Principal | ICD-10-CM

## 2017-11-13 DIAGNOSIS — L84 Corns and callosities: Secondary | ICD-10-CM

## 2017-11-13 DIAGNOSIS — M2042 Other hammer toe(s) (acquired), left foot: Secondary | ICD-10-CM

## 2017-11-13 NOTE — Patient Instructions (Signed)
Medication Instructions:  Your physician recommends that you continue on your current medications as directed. Please refer to the Current Medication list given to you today.  Labwork: NONE  Testing/Procedures: NONE  Follow-Up: Your physician wants you to follow-up in: 1 YEAR  You will receive a reminder letter in the mail two months in advance. If you don't receive a letter, please call our office to schedule the follow-up appointment.  If you need a refill on your cardiac medications before your next appointment, please call your pharmacy.  

## 2017-11-13 NOTE — Progress Notes (Signed)
Patient came in today to pick up custom made foot orthotics.  The goals were accomplished and the patient reported no dissatisfaction with said orthotics.  Patient was advised of breakin period and how to report any issues. 

## 2017-11-13 NOTE — Progress Notes (Signed)
Cardiology Office Note   Date:  11/13/2017   ID:  DEVANI ODONNEL, DOB [birth year], MRN [id number]  PCP:  Tammi Sou, MD  Cardiologist:   Skeet Latch, MD   Chief Complaint  Patient presents with  . Follow-up    8 months  . Shortness of Breath  . Edema      History of Present Illness: Jennifer Peck is a 84 y.o. female with CAD s/p PCI, hypertension and hyperlipidemia who presents for follow-up.  Ms. Karpel was previously patient of [name]. She last saw him 02/2015.  She had a bare metal stents placed in her left circumflex, RCA, OM 1 and OM 2 vessels in 2011.  She has not tolerated statin medications or Zetia in the past due to myalgias.  She has tried to manage her lipids with diet and exercise.  She was not interested in trying a PCSK9 inhibitor.  Since her last appointment Ms. Helmes has been feeling well.  She continues to do exercise class 3 days/week.  She feels well with exertion.  She has no chest pain or shortness of breath.  She only gets short of breath when the weather is very hot outside.  She never has shortness of breath inside.  She struggles from neuropathy in her feet.  She also notes easy bruising.  Her balance has been pretty good which she attributes to her tai chi class.  She also has not had any falls.  She has been struggling with hyperkalemia.  Her primary care provider and suggested that she stop her enalapril which she is hesitant to do so because she has not tolerated many other blood pressure medications.  She is trying to focus on having low potassium foods and on repeat her labs were better.   Past Medical History:  Diagnosis Date  . Allergic state 04/30/2011  . Anxiety   . BCC (basal cell carcinoma), face 11/06/2010  . Bilateral bunions 11/06/2010  . Constipation 11/06/2010  . Coronary artery disease 2011   BMS x 3  . GERD (gastroesophageal reflux disease) 05/11/2012  . History of hyperkalemia 01/23/2011  . Hyperlipidemia     Intolerant of statins and zetia.  Refused to consider PCSK9 inhibitor initially but as of 02/2017 cardiol f/u she would consider repatha if approved.  . Hypertension   . Lesion of labia 02/04/2012  . Melanoma in situ [facility]) 08/2016   Derm= Dr. Renda Rolls @ [facility] in [place].  . Osteoarthritis of both knees    R>L  . Osteopenia 11/23/2010; 02/2016   2017 T-score -1.9.  Repeat DEXA 2 yrs.  . Peripheral neuropathy 11/06/2010   Small fiber, non-diabetic per neurologist  . Right lumbar radiculopathy 01/2015   Improved with PT  . SCC (squamous cell carcinoma), arm 11/06/2010  . Seborrheic keratosis 04/30/2011  . Vertigo     Past Surgical History:  Procedure Laterality Date  . CARDIAC CATHETERIZATION  12/20/2009   3 bare metal stents  . DEXA  03/05/2016   T-score -1.9  . EYE SURGERY     cataract- left eye 09-24-11  . SKIN BIOPSY  08/2012   Shave excision of lesion on pt's back: irritated seb keratosis.  . TRANSTHORACIC ECHOCARDIOGRAM  12/05/2009   Mild LVH, normal systolic fxn, mild impaired relaxation.  No signif valvular dz.  . TUBAL LIGATION       Current Outpatient Medications  Medication Sig Dispense Refill  . ALPRAZolam (XANAX) 0.25 MG tablet Take 1 tablet (0.25  mg total) by mouth daily as needed. Panic attack 30 tablet 1  . amLODipine (NORVASC) 2.5 MG tablet TAKE 1 TABLET BY MOUTH ONCE DAILY 180 tablet 1  . aspirin 81 MG tablet Take 81 mg by mouth daily.    Marland Kitchen azelastine (OPTIVAR) 0.05 % ophthalmic solution INSTILL TWO DROPS INTO EACH EYE TWICE DAILY 6 mL 12  . Benfotiamine 150 MG CAPS Take 1 capsule by mouth daily. For numbness in feet    . Cyanocobalamin (VITAMIN B-12 CR PO) Take 1 tablet by mouth daily.     . enalapril (VASOTEC) 20 MG tablet TAKE 1 TABLET BY MOUTH IN THE MORNING AND 1/2 (ONE-HALF) AT BEDTIME 135 tablet 1  . ketoconazole (NIZORAL) 2 % shampoo     . magnesium oxide (MAG-OX) 400 MG tablet Take 400 mg by mouth once a week.     . metoprolol succinate  (TOPROL-XL) 25 MG 24 hr tablet TAKE 1 TABLET BY MOUTH ONCE DAILY 90 tablet 1  . Multiple Vitamin (MULTIVITAMIN) tablet Take 1 tablet by mouth daily. Takes occ    . Naftifine HCl (NAFTIN) 2 % GEL Apply to affected area qd-bid 60 g 3  . Omega-3 Fatty Acids (FISH OIL PO) Take 1 capsule by mouth daily.      No current facility-administered medications for this visit.     Allergies:   Chlorthalidone; Atenolol; Besivance [besifloxacin hcl]; Codeine; Crestor [rosuvastatin]; Gabapentin; Lipitor [atorvastatin]; Lovastatin; Simvastatin; Sulfa antibiotics; Vigamox [moxifloxacin]; and Welchol [colesevelam hcl]    Social History:  The patient  reports that she quit smoking about 40 years ago. She has never used smokeless tobacco. She reports that she drinks alcohol. She reports that she does not use drugs.   Family History:  The patient's family history includes Asthma in her paternal grandfather; Cancer in her daughter; Heart disease in her father; Hyperlipidemia in her daughter and son; Hypertension in her daughter, daughter, son, son, and son; Stroke in her mother and paternal grandmother; Vision loss in her maternal grandfather.    ROS:  Please see the history of present illness.   Otherwise, review of systems are positive for none.   All other systems are reviewed and negative.    PHYSICAL EXAM: VS:  BP 140/80 (BP Location: Left Arm, Patient Position: Sitting, Cuff Size: Normal)   Pulse 78   Ht 5' (1.524 m)   Wt 131 lb (59.4 kg)   BMI 25.58 kg/m  , BMI Body mass index is 25.58 kg/m. GENERAL:  Well appearing HEENT: Pupils equal round and reactive, fundi not visualized, oral mucosa unremarkable NECK:  No jugular venous distention, waveform within normal limits, carotid upstroke brisk and symmetric, no bruits LUNGS:  Clear to auscultation bilaterally HEART:  Mostly regular with occasional ectopy.  PMI not displaced or sustained,S1 and S2 within normal limits, no S3, no S4, no clicks, no rubs, no  murmurs ABD:  Flat, positive bowel sounds normal in frequency in pitch, no bruits, no rebound, no guarding, no midline pulsatile mass, no hepatomegaly, no splenomegaly EXT:  2 plus pulses throughout, no edema, no cyanosis no clubbing.  R LE induration and nickel-sized wound with ulceration  SKIN:  No rashes no nodules NEURO:  Cranial nerves II through XII grossly intact, motor grossly intact throughout PSYCH:  Cognitively intact, oriented to person place and time   EKG:  EKG is ordered today. The ekg ordered 03/14/17 demonstrates sinus rhythm. Rate 77 bpm.   02/19/17: Sinus rhythm.  Rate 70 bpm.  PVCs.  11/13/17: Sinus rhythm.  Rate 78 bpm.  Non-specific ST changes.     Recent Labs: 07/24/2017: Magnesium 2.1 10/23/2017: ALT 16; Hemoglobin 14.8; Platelets 177.0 11/01/2017: BUN 14; Creatinine, Ser 0.77; Potassium 4.8; Sodium 136    Lipid Panel    Component Value Date/Time   CHOL 232 (H) 01/30/2017 0904   TRIG 98.0 01/30/2017 0904   HDL 68.80 01/30/2017 0904   CHOLHDL 3 01/30/2017 0904   VLDL 19.6 01/30/2017 0904   LDLCALC 144 (H) 01/30/2017 0904   LDLDIRECT 128.0 12/02/2012 0837      Wt Readings from Last 3 Encounters:  11/13/17 131 lb (59.4 kg)  10/23/17 130 lb 8 oz (59.2 kg)  07/24/17 132 lb 4 oz (60 kg)      ASSESSMENT AND PLAN:  # CAD:  # Hyperlipidemia:  Ms. Villagomez is doing well.  She exercises regularly and has no exertional symptoms.  Continue aspirin and metoprolol.  She has been unable to tolerate statins or Zetia.  She does not want to try a PCSK9 inhibitor.  She will continue to work on diet and exercise.  Continue Omega 3.  Agree with her PCP that there is limited utility in continuing to check lipids.   # PVCs: Asymptmatic.  Continue metoprolol.    # Hypertension:  BP well-controlled at home.  Given her age a goal of <140/90 is reasonable.  Continue amlodipine, enalapril, and metoprolol.   Current medicines are reviewed at length with the patient today.  The  patient does not have concerns regarding medicines.  The following changes have been made:  no change  Labs/ tests ordered today include:   No orders of the defined types were placed in this encounter.   Disposition:   FU with Lille Karim C. Oval Linsey, MD, [facility] in 1 year.    Signed, Vedika Dumlao C. Oval Linsey, MD, [facility]  11/13/2017 12:52 PM    [facility]

## 2017-11-20 DIAGNOSIS — I10 Essential (primary) hypertension: Secondary | ICD-10-CM | POA: Insufficient documentation

## 2017-11-20 DIAGNOSIS — R42 Dizziness and giddiness: Secondary | ICD-10-CM | POA: Insufficient documentation

## 2017-11-20 DIAGNOSIS — F419 Anxiety disorder, unspecified: Secondary | ICD-10-CM | POA: Insufficient documentation

## 2018-01-12 ENCOUNTER — Other Ambulatory Visit: Payer: Self-pay | Admitting: Family Medicine

## 2018-01-21 ENCOUNTER — Ambulatory Visit (INDEPENDENT_AMBULATORY_CARE_PROVIDER_SITE_OTHER): Payer: Medicare Other | Admitting: Podiatry

## 2018-01-21 DIAGNOSIS — L84 Corns and callosities: Secondary | ICD-10-CM

## 2018-01-21 DIAGNOSIS — M79675 Pain in left toe(s): Secondary | ICD-10-CM

## 2018-01-21 DIAGNOSIS — M79674 Pain in right toe(s): Secondary | ICD-10-CM

## 2018-01-21 DIAGNOSIS — G9009 Other idiopathic peripheral autonomic neuropathy: Secondary | ICD-10-CM

## 2018-01-21 DIAGNOSIS — G629 Polyneuropathy, unspecified: Secondary | ICD-10-CM

## 2018-01-21 DIAGNOSIS — B351 Tinea unguium: Secondary | ICD-10-CM

## 2018-01-21 DIAGNOSIS — Q845 Enlarged and hypertrophic nails: Secondary | ICD-10-CM

## 2018-01-21 NOTE — Patient Instructions (Signed)

## 2018-01-28 ENCOUNTER — Encounter: Payer: Self-pay | Admitting: Family Medicine

## 2018-01-28 ENCOUNTER — Ambulatory Visit (INDEPENDENT_AMBULATORY_CARE_PROVIDER_SITE_OTHER): Payer: Medicare Other | Admitting: Family Medicine

## 2018-01-28 VITALS — BP 148/77 | HR 66 | Temp 98.3°F | Resp 16 | Ht 60.0 in | Wt 128.5 lb

## 2018-01-28 DIAGNOSIS — I10 Essential (primary) hypertension: Secondary | ICD-10-CM | POA: Diagnosis not present

## 2018-01-28 DIAGNOSIS — I2510 Atherosclerotic heart disease of native coronary artery without angina pectoris: Secondary | ICD-10-CM

## 2018-01-28 DIAGNOSIS — F419 Anxiety disorder, unspecified: Secondary | ICD-10-CM

## 2018-01-28 DIAGNOSIS — E663 Overweight: Secondary | ICD-10-CM | POA: Diagnosis not present

## 2018-01-28 DIAGNOSIS — Z23 Encounter for immunization: Secondary | ICD-10-CM | POA: Diagnosis not present

## 2018-01-28 LAB — BASIC METABOLIC PANEL
BUN: 16 mg/dL (ref 6–23)
CALCIUM: 9.7 mg/dL (ref 8.4–10.5)
CO2: 28 meq/L (ref 19–32)
Chloride: 99 mEq/L (ref 96–112)
Creatinine, Ser: 0.76 mg/dL (ref 0.40–1.20)
GFR: 75.45 mL/min (ref >60.00)
Glucose, Bld: 93 mg/dL (ref 70–99)
Potassium: 4.6 mEq/L (ref 3.5–5.1)
Sodium: 136 mEq/L (ref 135–145)

## 2018-01-28 MED ORDER — ALPRAZOLAM 0.25 MG PO TABS: 0.2500 mg | ORAL_TABLET | Freq: Every day | ORAL | 0 refills | Status: DC | PRN

## 2018-01-28 NOTE — Progress Notes (Signed)
OFFICE VISIT  01/28/2018   CC:  Chief Complaint  Patient presents with  . Follow-up    RCI, pt is not fasting.    HPI:    Patient is a 84 y.o.  female who presents for 3 mo f/u HTN, GAD with hx of panic (for which she takes alprazolam on a prn basis).  About 5 d/a she had sharp pain across L scapulae.  She does do exercise class three times a week. This resolved pretty quick, although it did make her worry and her heart sped up some.  She does eat a low potassium diet-->hx of hyperkalemia.  HTN: she got a new bp cuff and gets 130s/60s consistently.  GAD: when she gets physical sx's she gets VERY anxious and she RARELY takes alprazolam. She says it helps when she uses it.  Has the same bottle that was rx'd to her a couple of years ago.  ROS: no CP, no SOB, no wheezing, no cough, no dizziness, no HAs, no rashes, no melena/hematochezia.  No polyuria or polydipsia.  No myalgias or arthralgias.   Past Medical History:  Diagnosis Date  . Allergic state 04/30/2011  . Anxiety   . BCC (basal cell carcinoma), face 11/06/2010  . Bilateral bunions 11/06/2010  . Constipation 11/06/2010  . Coronary artery disease 2011   BMS x 3  . GERD (gastroesophageal reflux disease) 05/11/2012  . History of hyperkalemia 01/23/2011  . Hyperlipidemia    Intolerant of statins and zetia.  Refused to consider PCSK9 inhibitor initially but as of 02/2017 cardiol f/u she would consider repatha if approved.  . Hypertension   . Lesion of labia 02/04/2012  . Melanoma in situ [facility]) 08/2016   Derm= Dr. Renda Rolls @ [facility] in [place].  . Osteoarthritis of both knees    R>L  . Osteopenia 11/23/2010; 02/2016   2017 T-score -1.9.  Repeat DEXA 2 yrs.  . Peripheral neuropathy 11/06/2010   Small fiber, non-diabetic per neurologist  . Right lumbar radiculopathy 01/2015   Improved with PT  . SCC (squamous cell carcinoma), arm 11/06/2010  . Seborrheic keratosis 04/30/2011  . Vertigo     Past Surgical  History:  Procedure Laterality Date  . CARDIAC CATHETERIZATION  12/20/2009   3 bare metal stents  . DEXA  03/05/2016   T-score -1.9  . EYE SURGERY     cataract- left eye 09-24-11  . SKIN BIOPSY  08/2012   Shave excision of lesion on pt's back: irritated seb keratosis.  . TRANSTHORACIC ECHOCARDIOGRAM  12/05/2009   Mild LVH, normal systolic fxn, mild impaired relaxation.  No signif valvular dz.  . TUBAL LIGATION      Outpatient Medications Prior to Visit  Medication Sig Dispense Refill  . amLODipine (NORVASC) 2.5 MG tablet TAKE 1 TABLET BY MOUTH ONCE DAILY 180 tablet 1  . aspirin 81 MG tablet Take 81 mg by mouth daily.    Marland Kitchen azelastine (OPTIVAR) 0.05 % ophthalmic solution INSTILL TWO DROPS INTO EACH EYE TWICE DAILY 6 mL 12  . Benfotiamine 150 MG CAPS Take 1 capsule by mouth daily. For numbness in feet    . Cyanocobalamin (VITAMIN B-12 CR PO) Take 1 tablet by mouth daily.     . enalapril (VASOTEC) 20 MG tablet TAKE 1 TABLET BY MOUTH IN THE MORNING AND 1/2 (ONE-HALF) AT BEDTIME 135 tablet 1  . fluocinonide (LIDEX) 0.05 % external solution Apply 1 application topically daily as needed.    Marland Kitchen ketoconazole (NIZORAL) 2 % shampoo     .  magnesium oxide (MAG-OX) 400 MG tablet Take 400 mg by mouth once a week.     . metoprolol succinate (TOPROL-XL) 25 MG 24 hr tablet TAKE 1 TABLET BY MOUTH ONCE DAILY 90 tablet 1  . Multiple Vitamin (MULTIVITAMIN) tablet Take 1 tablet by mouth daily. Takes occ    . Naftifine HCl (NAFTIN) 2 % GEL Apply to affected area qd-bid 60 g 3  . Omega-3 Fatty Acids (FISH OIL PO) Take 1 capsule by mouth daily.     Marland Kitchen ALPRAZolam (XANAX) 0.25 MG tablet Take 1 tablet (0.25 mg total) by mouth daily as needed. Panic attack 30 tablet 1   No facility-administered medications prior to visit.     Allergies  Allergen Reactions  . Chlorthalidone     hyponatremia  . Atenolol     Caused fatigue   . Besivance [Besifloxacin Hcl]     rash  . Codeine Nausea And Vomiting  . Crestor  [Rosuvastatin]     "leg gave away"  . Gabapentin     Memory loss and confusion  . Lipitor [Atorvastatin] Other (See Comments)    Severe leg pain  . Lovastatin     myalgia  . Simvastatin     Her leg gave away on her and states thinks secondary to Simvastatin  . Sulfa Antibiotics Nausea Only  . Vigamox [Moxifloxacin]     rash  . Welchol [Colesevelam Hcl]     ROS As per HPI  PE: Blood pressure (!) 148/77, pulse 66, temperature 98.3 F (36.8 C), temperature source Oral, resp. rate 16, height 5' (1.524 m), weight 128 lb 8 oz (58.3 kg), SpO2 98 %. Gen: Alert, well appearing.  Patient is oriented to person, place, time, and situation. AFFECT: pleasant, lucid thought and speech. CV: RRR, with some ectopy noted, w/out m/r. Chest is clear, no wheezing or rales. Normal symmetric air entry throughout both lung fields. No chest wall deformities or tenderness. EXT: no clubbing or cyanosis.  R LL with 1+ pitting edema, L LL w/out pitting.    LABS:    Chemistry      Component Value Date/Time   NA 136 11/01/2017 0901   K 4.8 11/01/2017 0901   CL 99 11/01/2017 0901   CO2 31 11/01/2017 0901   BUN 14 11/01/2017 0901   CREATININE 0.77 11/01/2017 0901   CREATININE 0.78 06/06/2010 1745      Component Value Date/Time   CALCIUM 9.9 11/01/2017 0901   ALKPHOS 47 10/23/2017 1159   AST 10 10/23/2017 1159   ALT 16 10/23/2017 1159   BILITOT 0.6 10/23/2017 1159     Lab Results  Component Value Date   CHOL 232 (H) 01/30/2017   HDL 68.80 01/30/2017   LDLCALC 144 (H) 01/30/2017   LDLDIRECT 128.0 12/02/2012   TRIG 98.0 01/30/2017   CHOLHDL 3 01/30/2017   Lab Results  Component Value Date   WBC 6.0 10/23/2017   HGB 14.8 10/23/2017   HCT 44.2 10/23/2017   MCV 95.0 10/23/2017   PLT 177.0 10/23/2017   Lab Results  Component Value Date   [id number] 07/24/2017    IMPRESSION AND PLAN:  1) HTN: good control per home measurements. Goal <140/90 for her. Hx of hyperkalemia-->she is on  ACE-I.  She is eating a low potassium diet. BMET today.  2) Anxiety, some panic due to physical symptoms on occasion. Her alprazolam bottle of #30 is still half full and it is approx 82 yrs old. I rx'd alpraz 0.25mg , 1-2 qd  prn anxiety, #15, no RF today. She'll dispose of the old bottle.  An After Visit Summary was printed and given to the patient.  FOLLOW UP: Return for try to arrange CPE appt on 05/12/17 around the time of her AWV.Pt states she has a plan that covers an annual physical + screening tests (she wants mammo).  Signed:  Crissie Sickles, MD           01/28/2018

## 2018-02-08 ENCOUNTER — Encounter: Payer: Self-pay | Admitting: Podiatry

## 2018-02-08 NOTE — Progress Notes (Signed)
Subjective: Jennifer Peck presents with history of neuropathy and painful hyperkeratotic lesions bilaterally.  She also has painful, discolored, thick toenails which interfere with daily activities. Pain is aggravated when wearing enclosed shoe gear. Pain is relieved with periodic professional debridement.  Objective: General: 84 year old white female well-developed well-nourished in no acute distress.  Alert awake and oriented x3  Vascular Examination: Capillary refill time immediate x 10 digits Dorsalis pedis and Posterior tibial pulses present b/l Digital hair present x 10 digits Skin temperature gradient within normal limits bilaterally  Dermatological Examination: Skin with warm, dry and supple. Toenails 1-5 b/l discolored, thick, dystrophic with subungual debris and pain with palpation to nailbeds due to thickness of nails. Hyperkeratotic lesion distal tip right second toe, interdigitally medial left second toe.  Musculoskeletal: Muscle strength 5/5 to all LE muscle groups Severe hammertoe deformity 2 through 5 bilaterally Hallux valgus deformities bilaterally  Neurological: Sensation diminished with 10 gram monofilament. Vibratory sensation diminished  Assessment: 1. Painful onychomycosis toenails 1-5 b/l 2. Hyperkeratotic lesions x2 3. Peripheral neuropathy  Plan: 1. Toenails 1-5 b/l were debrided in length and girth without iatrogenic bleeding. 2. Hyperkeratotic lesion pared with sterile chisel blade x2.  Silicone digital toe caps were dispensed to patient for daily use to maintain comfort between visits.  She is to apply every morning and remove every evening.. 3. Patient to continue soft, supportive shoe gear 4. Patient to report any pedal injuries to medical professional  5. Follow up 3 months. Patient/POA to call should there be a concern in the interim.

## 2018-03-16 ENCOUNTER — Other Ambulatory Visit: Payer: Self-pay | Admitting: Family Medicine

## 2018-04-29 ENCOUNTER — Ambulatory Visit: Payer: Medicare Other | Admitting: Podiatry

## 2018-05-12 ENCOUNTER — Ambulatory Visit: Payer: Medicare Other

## 2018-05-15 NOTE — Progress Notes (Deleted)
Subjective:   Jennifer Peck is a 85 y.o. female who presents for Medicare Annual (Subsequent) preventive examination.  Review of Systems:  No ROS.  Medicare Wellness Visit. Additional risk factors are reflected in the social history.    Sleep patterns:  Home Safety/Smoke Alarms: Feels safe in home. Smoke alarms in place.  Living environment; residence and Firearm Safety: Lives alone in single story home, steps going in home. Rail in place. Closest family member 63 minutes away. Seat Belt Safety/Bike Helmet: Wears seat belt.   Female:   Pap-N/A       Mammo-05/20/2006, negative.Declines further testing. Dexa scan-03/06/2016, Osteopenia. Declines further testing.          CCS-Colonoscopy 05/17/2004, normal. No recall     Objective:     Vitals: There were no vitals taken for this visit.  There is no height or weight on file to calculate BMI.  Advanced Directives 05/06/2017 04/30/2016 05/02/2015  Does Patient Have a Medical Advance Directive? Yes Yes No  Type of Advance Directive Living will;Healthcare Power of Attorney Living will -  Copy of [place] in Chart? No - copy requested - -    Tobacco Social History   Tobacco Use  Smoking Status Former Smoker  . Last attempt to quit: 09/04/1977  . Years since quitting: 40.7  Smokeless Tobacco Never Used     Counseling given: Not Answered   Past Medical History:  Diagnosis Date  . Allergic state 04/30/2011  . Anxiety   . BCC (basal cell carcinoma), face 11/06/2010  . Bilateral bunions 11/06/2010  . Constipation 11/06/2010  . Coronary artery disease 2011   BMS x 3  . GERD (gastroesophageal reflux disease) 05/11/2012  . History of hyperkalemia 01/23/2011  . Hyperlipidemia    Intolerant of statins and zetia.  Refused to consider PCSK9 inhibitor initially but as of 02/2017 cardiol f/u she would consider repatha if approved.  . Hypertension   . Lesion of labia 02/04/2012  . Melanoma in situ [place]) 08/2016   Derm= Dr. Renda Rolls @ [facility] in [place].  . Osteoarthritis of both knees    R>L  . Osteopenia 11/23/2010; 02/2016   2017 T-score -1.9.  Repeat DEXA 2 yrs.  . Peripheral neuropathy 11/06/2010   Small fiber, non-diabetic per neurologist  . Right lumbar radiculopathy 01/2015   Improved with PT  . SCC (squamous cell carcinoma), arm 11/06/2010  . Seborrheic keratosis 04/30/2011  . Vertigo    Past Surgical History:  Procedure Laterality Date  . CARDIAC CATHETERIZATION  12/20/2009   3 bare metal stents  . DEXA  03/05/2016   T-score -1.9  . EYE SURGERY     cataract- left eye 09-24-11  . SKIN BIOPSY  08/2012   Shave excision of lesion on pt's back: irritated seb keratosis.  . TRANSTHORACIC ECHOCARDIOGRAM  12/05/2009   Mild LVH, normal systolic fxn, mild impaired relaxation.  No signif valvular dz.  . TUBAL LIGATION     Family History  Problem Relation Age of Onset  . Stroke Mother   . Heart disease Father   . Hypertension Daughter   . Hyperlipidemia Daughter   . Hypertension Son   . Cancer Daughter        rectal/ chemo and radiation  . Hypertension Daughter   . Hypertension Son   . Hyperlipidemia Son   . Hypertension Son   . Vision loss Maternal Grandfather   . Stroke Paternal Grandmother   . Asthma Paternal Grandfather  Social History   Socioeconomic History  . Marital status: Single    Spouse name: Not on file  . Number of children: Not on file  . Years of education: Not on file  . Highest education level: Not on file  Occupational History  . Not on file  Social Needs  . Financial resource strain: Not on file  . Food insecurity:    Worry: Not on file    Inability: Not on file  . Transportation needs:    Medical: Not on file    Non-medical: Not on file  Tobacco Use  . Smoking status: Former Smoker    Last attempt to quit: 09/04/1977    Years since quitting: 40.7  . Smokeless tobacco: Never Used  Substance and Sexual Activity  . Alcohol use: Yes     Comment: only on Holidays  . Drug use: No  . Sexual activity: Not on file  Lifestyle  . Physical activity:    Days per week: Not on file    Minutes per session: Not on file  . Stress: Not on file  Relationships  . Social connections:    Talks on phone: Not on file    Gets together: Not on file    Attends religious service: Not on file    Active member of club or organization: Not on file    Attends meetings of clubs or organizations: Not on file    Relationship status: Not on file  Other Topics Concern  . Not on file  Social History Narrative   Lives alone near [place].  Two daughters in [place], [place].   Widowed about 30 yrs.   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   Former smoker: approx 30 pack-yr hx, quit 1979.   Occasional social alcohol.   Exercises 3 days a week at [place] [employer].   Takes knitting classes and computer classes.    Outpatient Encounter Medications as of 05/16/2018  Medication Sig  . ALPRAZolam (XANAX) 0.25 MG tablet Take 1 tablet (0.25 mg total) by mouth daily as needed. Panic attack  . amLODipine (NORVASC) 2.5 MG tablet TAKE 1 TABLET BY MOUTH ONCE DAILY  . aspirin 81 MG tablet Take 81 mg by mouth daily.  Marland Kitchen azelastine (OPTIVAR) 0.05 % ophthalmic solution INSTILL TWO DROPS INTO EACH EYE TWICE DAILY  . Benfotiamine 150 MG CAPS Take 1 capsule by mouth daily. For numbness in feet  . Cyanocobalamin (VITAMIN B-12 CR PO) Take 1 tablet by mouth daily.   . enalapril (VASOTEC) 20 MG tablet TAKE 1 TABLET BY MOUTH IN THE MORNING AND 1/2 (ONE-HALF) AT BEDTIME  . fluocinonide (LIDEX) 0.05 % external solution Apply 1 application topically daily as needed.  Marland Kitchen ketoconazole (NIZORAL) 2 % shampoo   . magnesium oxide (MAG-OX) 400 MG tablet Take 400 mg by mouth once a week.   . metoprolol succinate (TOPROL-XL) 25 MG 24 hr tablet TAKE 1 TABLET BY MOUTH ONCE DAILY  . Multiple Vitamin (MULTIVITAMIN) tablet Take 1 tablet by mouth  daily. Takes occ  . Naftifine HCl (NAFTIN) 2 % GEL Apply to affected area qd-bid  . Omega-3 Fatty Acids (FISH OIL PO) Take 1 capsule by mouth daily.    No facility-administered encounter medications on file as of 05/16/2018.     Activities of Daily Living No flowsheet data found.  Patient Care Team: Tammi Sou, MD as PCP - General (Family Medicine) Skeet Latch, MD as Consulting Physician (Cardiology) Decherd, Margreta Journey  L, MD as Referring Physician (Dermatology) Garrel Ridgel, DPM as Consulting Physician (Podiatry)    Assessment:   This is a routine wellness examination for [facility].  Exercise Activities and Dietary recommendations   Diet (meal preparation, eat out, water intake, caffeinated beverages, dairy products, fruits and vegetables):   Breakfast: Lunch:  Dinner:      Goals      Patient Stated   . Maintain (pt-stated)     Patient would like to maintain current health status, by continuing to be active and eat healthy.       Other   . Patient Stated     Maintain current health by staying active and eating well.        Fall Risk Fall Risk  05/06/2017 04/30/2016 02/08/2016 09/28/2014  Falls in the past year? No No No Yes  Number falls in past yr: - - - 1  Injury with Fall? - - - Yes    Depression Screen PHQ 2/9 Scores 05/06/2017 04/30/2016 02/08/2016 09/28/2014  PHQ - 2 Score 0 0 0 0     Cognitive Function MMSE - Mini Mental State Exam 04/30/2016  Orientation to time 5  Orientation to Place 5  Registration 3  Attention/ Calculation 5  Recall 3  Language- name 2 objects 2  Language- repeat 1  Language- follow 3 step command 3  Language- read & follow direction 1  Write a sentence 1  Copy design 1  Total score 30        Immunization History  Administered Date(s) Administered  . Influenza Split 12/22/2010, 12/21/2011  . Influenza Whole 02/15/2010  . Influenza, High Dose Seasonal PF 02/01/2015, 01/02/2017, 01/28/2018  . Influenza,inj,Quad  PF,6+ Mos 12/02/2012, 01/21/2014  . Influenza-Unspecified 01/18/2016  . Pneumococcal Conjugate-13 02/10/2014  . Pneumococcal Polysaccharide-23 12/17/2012  . Td 02/15/2010  . Tdap 03/19/2010  . Zoster 03/20/2007  . Zoster Recombinat (Shingrix) 09/10/2017, 11/15/2017, 11/21/2017    Screening Tests Health Maintenance  Topic Date Due  . TETANUS/TDAP  03/19/2020  . INFLUENZA VACCINE  Completed  . DEXA SCAN  Completed  . PNA vac Low Risk Adult  Completed        Plan:     I have personally reviewed and noted the following in the patient's chart:   . Medical and social history . Use of alcohol, tobacco or illicit drugs  . Current medications and supplements . Functional ability and status . Nutritional status . Physical activity . Advanced directives . List of other physicians . Hospitalizations, surgeries, and ER visits in previous 12 months . Vitals . Screenings to include cognitive, depression, and falls . Referrals and appointments  In addition, I have reviewed and discussed with patient certain preventive protocols, quality metrics, and best practice recommendations. A written personalized care plan for preventive services as well as general preventive health recommendations were provided to patient.     Gerilyn Nestle, RN  05/15/2018

## 2018-05-16 ENCOUNTER — Ambulatory Visit: Payer: Medicare Other

## 2018-05-16 ENCOUNTER — Ambulatory Visit (INDEPENDENT_AMBULATORY_CARE_PROVIDER_SITE_OTHER): Payer: Medicare Other | Admitting: Family Medicine

## 2018-05-16 ENCOUNTER — Encounter: Payer: Self-pay | Admitting: Family Medicine

## 2018-05-16 ENCOUNTER — Encounter: Payer: Medicare Other | Admitting: Family Medicine

## 2018-05-16 VITALS — BP 146/80 | HR 65 | Temp 97.5°F | Resp 16 | Ht 61.0 in | Wt 132.0 lb

## 2018-05-16 DIAGNOSIS — J0100 Acute maxillary sinusitis, unspecified: Secondary | ICD-10-CM

## 2018-05-16 MED ORDER — DOXYCYCLINE HYCLATE 100 MG PO TABS: 100.0000 mg | ORAL_TABLET | Freq: Two times a day (BID) | ORAL | 0 refills | Status: DC

## 2018-05-16 NOTE — Patient Instructions (Signed)
Rest, hydrate.   mucinex, nasal saline.  Doxycyline  prescribed, take until completed.  If cough present it can last up to 6-8 weeks.  F/U 2 weeks of not improved.     Sinusitis, Adult Sinusitis is soreness and swelling (inflammation) of your sinuses. Sinuses are hollow spaces in the bones around your face. They are located:  Around your eyes.  In the middle of your forehead.  Behind your nose.  In your cheekbones. Your sinuses and nasal passages are lined with a fluid called mucus. Mucus drains out of your sinuses. Swelling can trap mucus in your sinuses. This lets germs (bacteria, virus, or fungus) grow, which leads to infection. Most of the time, this condition is caused by a virus. What are the causes? This condition is caused by:  Allergies.  Asthma.  Germs.  Things that block your nose or sinuses.  Growths in the nose (nasal polyps).  Chemicals or irritants in the air.  Fungus (rare). What increases the risk? You are more likely to develop this condition if:  You have a weak body defense system (immune system).  You do a lot of swimming or diving.  You use nasal sprays too much.  You smoke. What are the signs or symptoms? The main symptoms of this condition are pain and a feeling of pressure around the sinuses. Other symptoms include:  Stuffy nose (congestion).  Runny nose (drainage).  Swelling and warmth in the sinuses.  Headache.  Toothache.  A cough that may get worse at night.  Mucus that collects in the throat or the back of the nose (postnasal drip).  Being unable to smell and taste.  Being very tired (fatigue).  A fever.  Sore throat.  Bad breath. How is this diagnosed? This condition is diagnosed based on:  Your symptoms.  Your medical history.  A physical exam.  Tests to find out if your condition is short-term (acute) or long-term (chronic). Your doctor may: ? Check your nose for growths (polyps). ? Check your sinuses  using a tool that has a light (endoscope). ? Check for allergies or germs. ? Do imaging tests, such as an MRI or CT scan. How is this treated? Treatment for this condition depends on the cause and whether it is short-term or long-term.  If caused by a virus, your symptoms should go away on their own within 10 days. You may be given medicines to relieve symptoms. They include: ? Medicines that shrink swollen tissue in the nose. ? Medicines that treat allergies (antihistamines). ? A spray that treats swelling of the nostrils. ? Rinses that help get rid of thick mucus in your nose (nasal saline washes).  If caused by bacteria, your doctor may wait to see if you will get better without treatment. You may be given antibiotic medicine if you have: ? A very bad infection. ? A weak body defense system.  If caused by growths in the nose, you may need to have surgery. Follow these instructions at home: Medicines  Take, use, or apply over-the-counter and prescription medicines only as told by your doctor. These may include nasal sprays.  If you were prescribed an antibiotic medicine, take it as told by your doctor. Do not stop taking the antibiotic even if you start to feel better. Hydrate and humidify   Drink enough water to keep your pee (urine) pale yellow.  Use a cool mist humidifier to keep the humidity level in your home above 50%.  Breathe in steam for  10-15 minutes, 3-4 times a day, or as told by your doctor. You can do this in the bathroom while a hot shower is running.  Try not to spend time in cool or dry air. Rest  Rest as much as you can.  Sleep with your head raised (elevated).  Make sure you get enough sleep each night. General instructions   Put a warm, moist washcloth on your face 3-4 times a day, or as often as told by your doctor. This will help with discomfort.  Wash your hands often with soap and water. If there is no soap and water, use hand sanitizer.  Do  not smoke. Avoid being around people who are smoking (secondhand smoke).  Keep all follow-up visits as told by your doctor. This is important. Contact a doctor if:  You have a fever.  Your symptoms get worse.  Your symptoms do not get better within 10 days. Get help right away if:  You have a very bad headache.  You cannot stop throwing up (vomiting).  You have very bad pain or swelling around your face or eyes.  You have trouble seeing.  You feel confused.  Your neck is stiff.  You have trouble breathing. Summary  Sinusitis is swelling of your sinuses. Sinuses are hollow spaces in the bones around your face.  This condition is caused by tissues in your nose that become inflamed or swollen. This traps germs. These can lead to infection.  If you were prescribed an antibiotic medicine, take it as told by your doctor. Do not stop taking it even if you start to feel better.  Keep all follow-up visits as told by your doctor. This is important. This information is not intended to replace advice given to you by your health care provider. Make sure you discuss any questions you have with your health care provider. Document Released: 08/22/2007 Document Revised: 08/05/2017 Document Reviewed: 08/05/2017 Elsevier Interactive Patient Education  2019 [employer].

## 2018-05-16 NOTE — Progress Notes (Signed)
Jennifer Peck , 1922-09-08, 85 y.o., female MRN: [id number] Patient Care Team    Relationship Specialty Notifications Start End  McGowen, Adrian Blackwater, MD PCP - General Family Medicine  04/09/13    Comment: Catalina Pizza, MD Consulting Physician Cardiology  03/14/16   Devra Dopp, MD Referring Physician Dermatology  05/06/17   Garrel Ridgel, [place] Consulting Physician Podiatry  10/30/17     Chief Complaint  Patient presents with  . Nasal Congestion    x2 weeks with clear drainage  . Headache    x2 weeks off and on      Subjective: Pt presents for an OV with complaints of nasal congestion of 2 weeks duration.  Associated symptoms include sinus pressure, headache, bilateral ear pain, bilateral throat discomfort fatigue.  Denies cough, shortness of breath, wheezing, fevers, chills, nausea or vomit. Pt has tried Tylenol to ease their symptoms.  Patient reports she has been unable to do her normal activity in was unable to attend her exercise class which she enjoys because she has a headache and she is fatigued.  Depression screen [facility] 2/9 05/06/2017 04/30/2016 02/08/2016 09/28/2014  Decreased Interest 0 0 0 0  Down, Depressed, Hopeless 0 0 0 0  PHQ - 2 Score 0 0 0 0    Allergies  Allergen Reactions  . Chlorthalidone     hyponatremia  . Atenolol     Caused fatigue   . Besivance [Besifloxacin Hcl]     rash  . Codeine Nausea And Vomiting  . Crestor [Rosuvastatin]     "leg gave away"  . Gabapentin     Memory loss and confusion  . Lipitor [Atorvastatin] Other (See Comments)    Severe leg pain  . Lovastatin     myalgia  . Simvastatin     Her leg gave away on her and states thinks secondary to Simvastatin  . Sulfa Antibiotics Nausea Only  . Vigamox [Moxifloxacin]     rash  . Welchol [Colesevelam Hcl]    Social History   Social History Narrative   Lives alone near [place].  Two daughters in [place], [place].   Widowed about 30 yrs.   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   Former smoker: approx 30 pack-yr hx, quit 1979.   Occasional social alcohol.   Exercises 3 days a week at [place] [employer].   Takes knitting classes and computer classes.   Past Medical History:  Diagnosis Date  . Allergic state 04/30/2011  . Anxiety   . BCC (basal cell carcinoma), face 11/06/2010  . Bilateral bunions 11/06/2010  . Constipation 11/06/2010  . Coronary artery disease 2011   BMS x 3  . GERD (gastroesophageal reflux disease) 05/11/2012  . History of hyperkalemia 01/23/2011  . Hyperlipidemia    Intolerant of statins and zetia.  Refused to consider PCSK9 inhibitor initially but as of 02/2017 cardiol f/u she would consider repatha if approved.  . Hypertension   . Lesion of labia 02/04/2012  . Melanoma in situ [facility]) 08/2016   Derm= Dr. Renda Rolls @ [facility]ology specialists in [place].  . Osteoarthritis of both knees    R>L  . Osteopenia 11/23/2010; 02/2016   2017 T-score -1.9.  Repeat DEXA 2 yrs.  . Peripheral neuropathy 11/06/2010   Small fiber, non-diabetic per neurologist  . Right lumbar radiculopathy 01/2015   Improved with PT  . SCC (squamous cell carcinoma), arm 11/06/2010  . Seborrheic keratosis 04/30/2011  .  Vertigo    Past Surgical History:  Procedure Laterality Date  . CARDIAC CATHETERIZATION  12/20/2009   3 bare metal stents  . DEXA  03/05/2016   T-score -1.9  . EYE SURGERY     cataract- left eye 09-24-11  . SKIN BIOPSY  08/2012   Shave excision of lesion on pt's back: irritated seb keratosis.  . TRANSTHORACIC ECHOCARDIOGRAM  12/05/2009   Mild LVH, normal systolic fxn, mild impaired relaxation.  No signif valvular dz.  . TUBAL LIGATION     Family History  Problem Relation Age of Onset  . Stroke Mother   . Heart disease Father   . Hypertension Daughter   . Hyperlipidemia Daughter   . Hypertension Son   . Cancer Daughter        rectal/ chemo and radiation  . Hypertension Daughter   .  Hypertension Son   . Hyperlipidemia Son   . Hypertension Son   . Vision loss Maternal Grandfather   . Stroke Paternal Grandmother   . Asthma Paternal Grandfather    Allergies as of 05/16/2018      Reactions   Chlorthalidone    hyponatremia   Atenolol    Caused fatigue    Besivance [besifloxacin Hcl]    rash   Codeine Nausea And Vomiting   Crestor [rosuvastatin]    "leg gave away"   Gabapentin    Memory loss and confusion   Lipitor [atorvastatin] Other (See Comments)   Severe leg pain   Lovastatin    myalgia   Simvastatin    Her leg gave away on her and states thinks secondary to Simvastatin   Sulfa Antibiotics Nausea Only   Vigamox [moxifloxacin]    rash   Welchol [colesevelam Hcl]       Medication List       Accurate as of May 16, 2018  3:44 PM. Always use your most recent med list.        ALPRAZolam 0.25 MG tablet Commonly known as:  XANAX Take 1 tablet (0.25 mg total) by mouth daily as needed. Panic attack   amLODipine 2.5 MG tablet Commonly known as:  NORVASC TAKE 1 TABLET BY MOUTH ONCE DAILY   aspirin 81 MG tablet Take 81 mg by mouth daily.   azelastine 0.05 % ophthalmic solution Commonly known as:  OPTIVAR INSTILL TWO DROPS INTO EACH EYE TWICE DAILY   Benfotiamine 150 MG Caps Take 1 capsule by mouth daily. For numbness in feet   enalapril 20 MG tablet Commonly known as:  VASOTEC TAKE 1 TABLET BY MOUTH IN THE MORNING AND 1/2 (ONE-HALF) AT BEDTIME   FISH OIL PO Take 1 capsule by mouth daily.   fluocinonide 0.05 % external solution Commonly known as:  LIDEX Apply 1 application topically daily as needed.   ketoconazole 2 % shampoo Commonly known as:  NIZORAL   magnesium oxide 400 MG tablet Commonly known as:  MAG-OX Take 400 mg by mouth once a week.   metoprolol succinate 25 MG 24 hr tablet Commonly known as:  TOPROL-XL TAKE 1 TABLET BY MOUTH ONCE DAILY   multivitamin tablet Take 1 tablet by mouth daily. Takes occ   Naftifine HCl  2 % Gel Commonly known as:  NAFTIN Apply to affected area qd-bid   VITAMIN B-12 CR PO Take 1 tablet by mouth daily.       All past medical history, surgical history, allergies, family history, immunizations andmedications were updated in the EMR today and reviewed under the history  and medication portions of their EMR.     ROS: Negative, with the exception of above mentioned in HPI   Objective:  BP (!) 146/80 (BP Location: Right Arm, Patient Position: Sitting, Cuff Size: Normal)   Pulse 65   Temp (!) 97.5 F (36.4 C) (Oral)   Resp 16   Ht 5\' 1"  (1.549 m)   Wt 132 lb (59.9 kg)   SpO2 98%   BMI 24.94 kg/m  Body mass index is 24.94 kg/m. Gen: Afebrile. No acute distress. Nontoxic in appearance, well developed, well nourished.  HENT: AT. [place]. Bilateral TM visualized with fullness bilaterally, no erythema. MMM, no oral lesions. Bilateral nares with erythema and drainage. Throat without erythema or exudates. PND present. No cough. TTP max sinus.  Eyes:Pupils Equal Round Reactive to light, Extraocular movements intact,  Conjunctiva without redness, discharge or icterus. Neck/lymp/endocrine: Supple,no lymphadenopathy CV: RRR  Chest: CTAB, no wheeze or crackles. Good air movement, normal resp effort.  Abd: Soft. NTND. BS present  Skin: no rashes, purpura or petechiae.  Neuro: Normal gait. PERLA. EOMi. Alert. Oriented x3  No exam data present No results found. No results found for this or any previous visit (from the past 24 hour(s)).  Assessment/Plan: MIKAYA BUNNER is a 85 y.o. female present for OV for  Acute non-recurrent maxillary sinusitis Rest, hydrate.  mucinex, nasal saline.  Doxycyline  prescribed, take until completed.  If cough present it can last up to 6-8 weeks.  F/U 2 weeks of not improved.    Reviewed expectations re: course of current medical issues.  Discussed self-management of symptoms.  Outlined signs and symptoms indicating need for more acute  intervention.  Patient verbalized understanding and all questions were answered.  Patient received an After-Visit Summary.    No orders of the defined types were placed in this encounter.    Note is dictated utilizing voice recognition software. Although note has been proof read prior to signing, occasional typographical errors still can be missed. If any questions arise, please do not hesitate to call for verification.   electronically signed by:  Howard Pouch, DO  [place]

## 2018-05-21 ENCOUNTER — Other Ambulatory Visit: Payer: Self-pay | Admitting: Family Medicine

## 2018-05-22 ENCOUNTER — Ambulatory Visit (INDEPENDENT_AMBULATORY_CARE_PROVIDER_SITE_OTHER): Payer: Medicare Other | Admitting: Podiatry

## 2018-05-22 DIAGNOSIS — B351 Tinea unguium: Secondary | ICD-10-CM

## 2018-05-22 DIAGNOSIS — M79675 Pain in left toe(s): Secondary | ICD-10-CM | POA: Diagnosis not present

## 2018-05-22 DIAGNOSIS — M79674 Pain in right toe(s): Secondary | ICD-10-CM

## 2018-05-22 DIAGNOSIS — G629 Polyneuropathy, unspecified: Secondary | ICD-10-CM | POA: Diagnosis not present

## 2018-05-22 DIAGNOSIS — L84 Corns and callosities: Secondary | ICD-10-CM

## 2018-05-22 NOTE — Patient Instructions (Signed)
Corns and Calluses Corns are small areas of thickened skin that occur on the top, sides, or tip of a toe. They contain a cone-shaped core with a point that can press on a nerve below. This causes pain.  Calluses are areas of thickened skin that can occur anywhere on the body, including the hands, fingers, palms, soles of the feet, and heels. Calluses are usually larger than corns. What are the causes? Corns and calluses are caused by rubbing (friction) or pressure, such as from shoes that are too tight or do not fit properly. What increases the risk? Corns are more likely to develop in people who have misshapen toes (toe deformities), such as hammer toes. Calluses can occur with friction to any area of the skin. They are more likely to develop in people who:  Work with their hands.  Wear shoes that fit poorly, are too tight, or are high-heeled.  Have toe deformities. What are the signs or symptoms? Symptoms of a corn or callus include:  A hard growth on the skin.  Pain or tenderness under the skin.  Redness and swelling.  Increased discomfort while wearing tight-fitting shoes, if your feet are affected. If a corn or callus becomes infected, symptoms may include:  Redness and swelling that gets worse.  Pain.  Fluid, blood, or pus draining from the corn or callus. How is this diagnosed? Corns and calluses may be diagnosed based on your symptoms, your medical history, and a physical exam. How is this treated? Treatment for corns and calluses may include:  Removing the cause of the friction or pressure. This may involve: ? Changing your shoes. ? Wearing shoe inserts (orthotics) or other protective layers in your shoes, such as a corn pad. ? Wearing gloves.  Applying medicine to the skin (topical medicine) to help soften skin in the hardened, thickened areas.  Removing layers of dead skin with a file to reduce the size of the corn or callus.  Removing the corn or callus with a  scalpel or laser.  Taking antibiotic medicines, if your corn or callus is infected.  Having surgery, if a toe deformity is the cause. Follow these instructions at home:   Take over-the-counter and prescription medicines only as told by your health care provider.  If you were prescribed an antibiotic, take it as told by your health care provider. Do not stop taking it even if your condition starts to improve.  Wear shoes that fit well. Avoid wearing high-heeled shoes and shoes that are too tight or too loose.  Wear any padding, protective layers, gloves, or orthotics as told by your health care provider.  Soak your hands or feet and then use a file or pumice stone to soften your corn or callus. Do this as told by your health care provider.  Check your corn or callus every day for symptoms of infection. Contact a health care provider if you:  Notice that your symptoms do not improve with treatment.  Have redness or swelling that gets worse.  Notice that your corn or callus becomes painful.  Have fluid, blood, or pus coming from your corn or callus.  Have new symptoms. Summary  Corns are small areas of thickened skin that occur on the top, sides, or tip of a toe.  Calluses are areas of thickened skin that can occur anywhere on the body, including the hands, fingers, palms, and soles of the feet. Calluses are usually larger than corns.  Corns and calluses are caused by   rubbing (friction) or pressure, such as from shoes that are too tight or do not fit properly.  Treatment may include wearing any padding, protective layers, gloves, or orthotics as told by your health care provider. This information is not intended to replace advice given to you by your health care provider. Make sure you discuss any questions you have with your health care provider. Document Released: 12/10/2003 Document Revised: 01/16/2017 Document Reviewed: 01/16/2017 Elsevier Interactive Patient Education   2019 [employer]  Onychomycosis/Fungal Toenails  WHAT IS IT? An infection that lies within the keratin of your nail plate that is caused by a fungus.  WHY ME? Fungal infections affect all ages, sexes, races, and creeds.  There may be many factors that predispose you to a fungal infection such as age, coexisting medical conditions such as diabetes, or an autoimmune disease; stress, medications, fatigue, genetics, etc.  Bottom line: fungus thrives in a warm, moist environment and your shoes offer such a location.  IS IT CONTAGIOUS? Theoretically, yes.  You do not want to share shoes, nail clippers or files with someone who has fungal toenails.  Walking around barefoot in the same room or sleeping in the same bed is unlikely to transfer the organism.  It is important to realize, however, that fungus can spread easily from one nail to the next on the same foot.  HOW DO WE TREAT THIS?  There are several ways to treat this condition.  Treatment may depend on many factors such as age, medications, pregnancy, liver and kidney conditions, etc.  It is best to ask your doctor which options are available to you.  1. No treatment.   Unlike many other medical concerns, you can live with this condition.  However for many people this can be a painful condition and may lead to ingrown toenails or a bacterial infection.  It is recommended that you keep the nails cut short to help reduce the amount of fungal nail. 2. Topical treatment.  These range from herbal remedies to prescription strength nail lacquers.  About 40-50% effective, topicals require twice daily application for approximately 9 to 12 months or until an entirely new nail has grown out.  The most effective topicals are medical grade medications available through physicians offices. 3. Oral antifungal medications.  With an 80-90% cure rate, the most common oral medication requires 3 to 4 months of therapy and stays in your system for a year as the new nail  grows out.  Oral antifungal medications do require blood work to make sure it is a safe drug for you.  A liver function panel will be performed prior to starting the medication and after the first month of treatment.  It is important to have the blood work performed to avoid any harmful side effects.  In general, this medication safe but blood work is required. 4. Laser Therapy.  This treatment is performed by applying a specialized laser to the affected nail plate.  This therapy is noninvasive, fast, and non-painful.  It is not covered by insurance and is therefore, out of pocket.  The results have been very good with a 80-95% cure rate.  [facility] is the only practice in the area to offer this therapy. 5. Permanent Nail Avulsion.  Removing the entire nail so that a new nail will not grow back. 

## 2018-06-01 ENCOUNTER — Encounter: Payer: Self-pay | Admitting: Podiatry

## 2018-06-01 NOTE — Progress Notes (Signed)
Subjective: Jennifer Peck presents with  neuropathy and cc of painful, discolored, thick toenails and painful callus and corns which interfere with activities of daily living. Pain is aggravated when wearing enclosed shoe gear. Pain is relieved with periodic professional debridement.  McGowen, Adrian Blackwater, MD is her PCP.    Current Outpatient Medications:  .  ALPRAZolam (XANAX) 0.25 MG tablet, Take 1 tablet (0.25 mg total) by mouth daily as needed. Panic attack, Disp: 15 tablet, Rfl: 0 .  amLODipine (NORVASC) 2.5 MG tablet, TAKE 1 TABLET BY MOUTH ONCE DAILY, Disp: 180 tablet, Rfl: 1 .  aspirin 81 MG tablet, Take 81 mg by mouth daily., Disp: , Rfl:  .  azelastine (OPTIVAR) 0.05 % ophthalmic solution, INSTILL TWO DROPS INTO EACH EYE TWICE DAILY, Disp: 6 mL, Rfl: 12 .  Benfotiamine 150 MG CAPS, Take 1 capsule by mouth daily. For numbness in feet, Disp: , Rfl:  .  Cyanocobalamin (VITAMIN B-12 CR PO), Take 1 tablet by mouth daily. , Disp: , Rfl:  .  doxycycline (VIBRA-TABS) 100 MG tablet, Take 1 tablet (100 mg total) by mouth 2 (two) times daily., Disp: 14 tablet, Rfl: 0 .  enalapril (VASOTEC) 20 MG tablet, TAKE 1 TABLET BY MOUTH IN THE MORNING AND 1/2 (ONE-HALF) AT BEDTIME, Disp: 135 tablet, Rfl: 1 .  fluocinonide (LIDEX) 0.05 % external solution, Apply 1 application topically daily as needed., Disp: , Rfl:  .  ketoconazole (NIZORAL) 2 % shampoo, , Disp: , Rfl:  .  magnesium oxide (MAG-OX) 400 MG tablet, Take 400 mg by mouth once a week. , Disp: , Rfl:  .  metoprolol succinate (TOPROL-XL) 25 MG 24 hr tablet, TAKE 1 TABLET BY MOUTH ONCE DAILY, Disp: 90 tablet, Rfl: 1 .  Multiple Vitamin (MULTIVITAMIN) tablet, Take 1 tablet by mouth daily. Takes occ, Disp: , Rfl:  .  Naftifine HCl (NAFTIN) 2 % GEL, Apply to affected area qd-bid, Disp: 60 g, Rfl: 3 .  Omega-3 Fatty Acids (FISH OIL PO), Take 1 capsule by mouth daily. , Disp: , Rfl:   Allergies  Allergen Reactions  . Chlorthalidone     hyponatremia   . Atenolol     Caused fatigue   . Besivance [Besifloxacin Hcl]     rash  . Codeine Nausea And Vomiting  . Crestor [Rosuvastatin]     "leg gave away"  . Gabapentin     Memory loss and confusion  . Lipitor [Atorvastatin] Other (See Comments)    Severe leg pain  . Lovastatin     myalgia  . Simvastatin     Her leg gave away on her and states thinks secondary to Simvastatin  . Sulfa Antibiotics Nausea Only  . Vigamox [Moxifloxacin]     rash  . Welchol [Colesevelam Hcl]     Vascular Examination: Capillary refill time immediate x 10 digits.  Dorsalis pedis and Posterior tibial pulses present b/l.  Digital hair present x 10 digits.  Skin temperature gradient WNL b/l.  Dermatological Examination: Skin with normal turgor, texture and tone b/l.  Toenails 1-5 b/l discolored, thick, dystrophic with subungual debris and pain with palpation to nailbeds due to thickness of nails.  Hyperkeratotic lesion distal tip right 2nd toe, lateral left 2nd toe and submet head 5 right foot. No erythema, no edema, no drainage, no flocculence noted.  Musculoskeletal: Muscle strength 5/5 to all LE muscle groups  Neurological: Sensation diminished with 10 gram monofilament.  Vibratory sensation diminished  Assessment: 1. Painful onychomycosis toenails 1-5 b/l  2. Callus submet head 5 right foot 3. Corn b/l 2nd digits 4. Peripheral neuropathy  Plan: 1. Toenails 1-5 b/l were debrided in length and girth without iatrogenic bleeding. Corn(s) pared b/l 2nd digits with sterile scalpel blade without incident. Calluses pared submetatarsal head(s) 5 right foot with sterile scalpel blade without incident. 2. Patient to continue soft, supportive shoe gear. 3. Patient to report any pedal injuries to medical professional  4. Follow up 3 months.  5. Patient/POA to call should there be a concern in the interim.

## 2018-07-13 ENCOUNTER — Other Ambulatory Visit: Payer: Self-pay | Admitting: Family Medicine

## 2018-07-14 ENCOUNTER — Other Ambulatory Visit: Payer: Self-pay

## 2018-07-14 ENCOUNTER — Ambulatory Visit (INDEPENDENT_AMBULATORY_CARE_PROVIDER_SITE_OTHER): Payer: Medicare Other | Admitting: Family Medicine

## 2018-07-14 ENCOUNTER — Encounter: Payer: Self-pay | Admitting: Family Medicine

## 2018-07-14 VITALS — BP 130/76 | HR 63

## 2018-07-14 DIAGNOSIS — I10 Essential (primary) hypertension: Secondary | ICD-10-CM

## 2018-07-14 DIAGNOSIS — Z8639 Personal history of other endocrine, nutritional and metabolic disease: Secondary | ICD-10-CM

## 2018-07-14 MED ORDER — ENALAPRIL MALEATE 20 MG PO TABS: ORAL_TABLET | ORAL | 1 refills | Status: DC

## 2018-07-14 MED ORDER — AMLODIPINE BESYLATE 2.5 MG PO TABS: 2.5000 mg | ORAL_TABLET | Freq: Every day | ORAL | 1 refills | Status: DC

## 2018-07-14 NOTE — Telephone Encounter (Signed)
Both medications were refilled by PCP today.

## 2018-07-14 NOTE — Progress Notes (Signed)
Virtual Visit via Video Note  I connected with pt  on 07/14/18 at  2:40 PM EDT by telephone.  She does not have the technology necessary for video telemedicine visit.  Location patient: home Location provider:work or office Persons participating in the virtual visit: patient, provider.  I discussed the limitations of evaluation and management by telemedicine and the availability of in person appointments. The patient expressed understanding and agreed to proceed.  Telephone visit is a necessity given the COVID-19 restrictions in place at the current time.  HPI: 85 y/o WF with whom I am doing a telephone visit today (due to COVID-19 pandemic restrictions) for f/u HTN and hx of hyperkalemia. Feeling well. Staying at home for the most part.  She is compliant with meds as rx'd. Home bp monitoring shows 120s/60s, HR 60s. She is piddling in home and yard, but no vigorous exercise. She is careful to avoid foods that are high in potassium content.   ROS: no CP, no SOB, no wheezing, no cough, no dizziness, no HAs, no rashes, no melena/hematochezia.  No polyuria or polydipsia.  No myalgias or arthralgias.   Past Medical History:  Diagnosis Date  . Allergic state 04/30/2011  . Anxiety   . BCC (basal cell carcinoma), face 11/06/2010  . Bilateral bunions 11/06/2010  . Constipation 11/06/2010  . Coronary artery disease 2011   BMS x 3  . GERD (gastroesophageal reflux disease) 05/11/2012  . History of hyperkalemia 01/23/2011  . Hyperlipidemia    Intolerant of statins and zetia.  Refused to consider PCSK9 inhibitor initially but as of 02/2017 cardiol f/u she would consider repatha if approved.  . Hypertension   . Lesion of labia 02/04/2012  . Melanoma in situ [facility]) 08/2016   Derm= Dr. Renda Rolls @ [facility] in [place].  . Osteoarthritis of both knees    R>L  . Osteopenia 11/23/2010; 02/2016   2017 T-score -1.9.  Repeat DEXA 2 yrs.  . Peripheral neuropathy 11/06/2010   Small fiber,  non-diabetic per neurologist  . Right lumbar radiculopathy 01/2015   Improved with PT  . SCC (squamous cell carcinoma), arm 11/06/2010  . Seborrheic keratosis 04/30/2011  . Vertigo     Past Surgical History:  Procedure Laterality Date  . CARDIAC CATHETERIZATION  12/20/2009   3 bare metal stents  . DEXA  03/05/2016   T-score -1.9  . EYE SURGERY     cataract- left eye 09-24-11  . SKIN BIOPSY  08/2012   Shave excision of lesion on pt's back: irritated seb keratosis.  . TRANSTHORACIC ECHOCARDIOGRAM  12/05/2009   Mild LVH, normal systolic fxn, mild impaired relaxation.  No signif valvular dz.  . TUBAL LIGATION      Family History  Problem Relation Age of Onset  . Stroke Mother   . Heart disease Father   . Hypertension Daughter   . Hyperlipidemia Daughter   . Hypertension Son   . Cancer Daughter        rectal/ chemo and radiation  . Hypertension Daughter   . Hypertension Son   . Hyperlipidemia Son   . Hypertension Son   . Vision loss Maternal Grandfather   . Stroke Paternal Grandmother   . Asthma Paternal Grandfather      Current Outpatient Medications:  .  amLODipine (NORVASC) 2.5 MG tablet, TAKE 1 TABLET BY MOUTH ONCE DAILY, Disp: 180 tablet, Rfl: 1 .  aspirin 81 MG tablet, Take 81 mg by mouth daily., Disp: , Rfl:  .  azelastine (OPTIVAR) 0.05 %  ophthalmic solution, INSTILL TWO DROPS INTO EACH EYE TWICE DAILY, Disp: 6 mL, Rfl: 12 .  Benfotiamine 150 MG CAPS, Take 1 capsule by mouth daily. For numbness in feet, Disp: , Rfl:  .  Cyanocobalamin (VITAMIN B-12 CR PO), Take 1 tablet by mouth daily. , Disp: , Rfl:  .  enalapril (VASOTEC) 20 MG tablet, TAKE 1 TABLET BY MOUTH IN THE MORNING AND 1/2 (ONE-HALF) AT BEDTIME, Disp: 135 tablet, Rfl: 1 .  fluocinonide (LIDEX) 0.05 % external solution, Apply 1 application topically daily as needed., Disp: , Rfl:  .  ketoconazole (NIZORAL) 2 % shampoo, , Disp: , Rfl:  .  magnesium oxide (MAG-OX) 400 MG tablet, Take 400 mg by mouth once a  week. , Disp: , Rfl:  .  metoprolol succinate (TOPROL-XL) 25 MG 24 hr tablet, TAKE 1 TABLET BY MOUTH ONCE DAILY, Disp: 90 tablet, Rfl: 1 .  Multiple Vitamin (MULTIVITAMIN) tablet, Take 1 tablet by mouth daily. Takes occ, Disp: , Rfl:  .  Naftifine HCl (NAFTIN) 2 % GEL, Apply to affected area qd-bid, Disp: 60 g, Rfl: 3 .  Omega-3 Fatty Acids (FISH OIL PO), Take 1 capsule by mouth daily. , Disp: , Rfl:  .  ALPRAZolam (XANAX) 0.25 MG tablet, Take 1 tablet (0.25 mg total) by mouth daily as needed. Panic attack (Patient not taking: Reported on 07/14/2018), Disp: 15 tablet, Rfl: 0  EXAM:  VITALS per patient if applicable: No exam today b/c pt is TELEPHONE visit.  LABS: none today    Chemistry      Component Value Date/Time   NA 136 01/28/2018 0924   K 4.6 01/28/2018 0924   CL 99 01/28/2018 0924   CO2 28 01/28/2018 0924   BUN 16 01/28/2018 0924   CREATININE 0.76 01/28/2018 0924   CREATININE 0.78 06/06/2010 1745      Component Value Date/Time   CALCIUM 9.7 01/28/2018 0924   ALKPHOS 47 10/23/2017 1159   AST 10 10/23/2017 1159   ALT 16 10/23/2017 1159   BILITOT 0.6 10/23/2017 1159     Lab Results  Component Value Date   CHOL 232 (H) 01/30/2017   HDL 68.80 01/30/2017   LDLCALC 144 (H) 01/30/2017   LDLDIRECT 128.0 12/02/2012   TRIG 98.0 01/30/2017   CHOLHDL 3 01/30/2017   Lab Results  Component Value Date   WBC 6.0 10/23/2017   HGB 14.8 10/23/2017   HCT 44.2 10/23/2017   MCV 95.0 10/23/2017   PLT 177.0 10/23/2017    ASSESSMENT AND PLAN:  Discussed the following assessment and plan:  1) HTN: The current medical regimen is effective;  continue present plan and medications. Hx of hyperkalemia: she eats low K diet.  She is currently on ACE-I.  Last couple of K rechecks have been wnl. She has elected to defer any lab visit right now, citing worries/fears about covid 19, etc. I think that is fine, and she understands that if/when she feels comfortable getting out in the next  couple months she can make lab appt and get the BMET I recommend. If she doesn't get this, will check this lab at next f/u in 6 mo.  I discussed the assessment and treatment plan with the patient. The patient was provided an opportunity to ask questions and all were answered. The patient agreed with the plan and demonstrated an understanding of the instructions.   The patient was advised to call back or seek an in-person evaluation if the symptoms worsen or if the condition fails  to improve as anticipated.  Spent 15 min with pt today, with >50% of this time spent in counseling and care coordination regarding the above problems.  F/u: 6 mo RCI  Signed:  Crissie Sickles, MD           07/14/2018

## 2018-07-14 NOTE — Telephone Encounter (Signed)
Pt was called and scheduled for today to get refills and for [facility]

## 2018-08-04 ENCOUNTER — Ambulatory Visit: Payer: Self-pay | Admitting: Family Medicine

## 2018-08-04 MED ORDER — DOXYCYCLINE HYCLATE 100 MG PO TABS: ORAL_TABLET | ORAL | 0 refills | Status: DC

## 2018-08-04 NOTE — Telephone Encounter (Signed)
Tell patient that in this type of scenario the appropriate thing is to take ONE dose of antibiotic to try to decrease the risk of her getting a tick born dz.  I'll eRx doxycycline.-thx

## 2018-08-04 NOTE — Telephone Encounter (Signed)
Patient aware.

## 2018-08-04 NOTE — Telephone Encounter (Signed)
Pt. Pulled a tick off this morning from under her left breast. Has an area 1/2 inch of redness. Tick is poppy seed size. Was able to get the whole tick off. Concerned about tick-borne illness and would like an antibiotic. Unable to do a virtual visit. Please advise. Contact number [phone]. Answer Assessment - Initial Assessment Questions 1. TYPE of TICK: "Is it a wood tick or a deer tick?" If unsure, ask: "What size was the tick?" "Did it look more like a watermelon seed or a poppy seed?"      Poppy seed 2. LOCATION: "Where is the tick bite located?"      Under left breast 3. ONSET: "How long do you think the tick was attached before you removed it?" (Hours or days)      A few hours 4. TETANUS: "When was the last tetanus booster?"      Unsure 5. PREGNANCY: "Is there any chance you are pregnant?" "When was your last menstrual period?"     No  Protocols used: TICK BITE-A-AH

## 2018-08-04 NOTE — Telephone Encounter (Signed)
Please advise, thanks.

## 2018-08-19 ENCOUNTER — Encounter: Payer: Self-pay | Admitting: Podiatry

## 2018-08-19 ENCOUNTER — Ambulatory Visit (INDEPENDENT_AMBULATORY_CARE_PROVIDER_SITE_OTHER): Payer: Medicare Other | Admitting: Podiatry

## 2018-08-19 ENCOUNTER — Other Ambulatory Visit: Payer: Self-pay

## 2018-08-19 VITALS — Temp 98.0°F

## 2018-08-19 DIAGNOSIS — L84 Corns and callosities: Secondary | ICD-10-CM

## 2018-08-19 DIAGNOSIS — B353 Tinea pedis: Secondary | ICD-10-CM

## 2018-08-19 DIAGNOSIS — M79675 Pain in left toe(s): Secondary | ICD-10-CM

## 2018-08-19 DIAGNOSIS — M79674 Pain in right toe(s): Secondary | ICD-10-CM | POA: Diagnosis not present

## 2018-08-19 DIAGNOSIS — G629 Polyneuropathy, unspecified: Secondary | ICD-10-CM | POA: Diagnosis not present

## 2018-08-19 DIAGNOSIS — B351 Tinea unguium: Secondary | ICD-10-CM | POA: Diagnosis not present

## 2018-08-19 MED ORDER — KETOCONAZOLE 2 % EX CREA: 1.0000 "application " | TOPICAL_CREAM | Freq: Every day | CUTANEOUS | 1 refills | Status: DC

## 2018-08-19 NOTE — Progress Notes (Signed)
Subjective: Jennifer Peck presents today with h/o peripheral neuropathy with painful, thick toenails 1-5 b/l that she cannot cut and which interfere with daily activities.  Pain is aggravated when wearing enclosed shoe gear.  She complains of interdigital corn left foot between digits 3 and 4.   McGowen, Adrian Blackwater, MD is her PCP. Last visit was 06/01/2018.   Allergies  Allergen Reactions  . Chlorthalidone     hyponatremia  . Atenolol     Caused fatigue   . Besivance [Besifloxacin Hcl]     rash  . Codeine Nausea And Vomiting  . Crestor [Rosuvastatin]     "leg gave away"  . Gabapentin     Memory loss and confusion  . Lipitor [Atorvastatin] Other (See Comments)    Severe leg pain  . Lovastatin     myalgia  . Simvastatin     Her leg gave away on her and states thinks secondary to Simvastatin  . Sulfa Antibiotics Nausea Only  . Vigamox [Moxifloxacin]     rash  . Welchol [Colesevelam Hcl]     Objective: Vitals:   08/19/18 1314  Temp: 98 F (36.7 C)    Vascular Examination: Capillary refill time immediate x 10 digits.  Dorsalis pedis and Posterior tibial pulses palpable b/l.  Digital hair present x 10 digits.  Skin temperature gradient WNL b/l.  Dermatological Examination: Skin with normal turgor, texture and tone b/l.  Toenails 1-5 b/l discolored, thick, dystrophic with subungual debris and pain with palpation to nailbeds due to thickness of nails.  Hyperkeratotic lesion distal tip right 3rd digit, lateral PIPJ left 3rd toe and submet head 5 right foot. No erythema, no edema, no drainage, no flocculence noted.  Diffuse scaling noted peripherally and plantarly right foot with mild foot odor.    No interdigital macerations.  No blisters, no weeping. No signs of secondary bacterial infection noted.  Musculoskeletal: Muscle strength 5/5 to all LE muscle groups  Hammertoes 2-4 b/l.  No pain, crepitus or joint limitation noted with ROM.    Neurological: Sensation diminished b/l with 10 gram monofilament.  Vibratory sensation diminished b/l.  Assessment: Painful onychomycosis toenails 1-5 b/l  Tinea pedis right foot Corns b/l 3rd digits Callus submet head 5 right Peripheral neuropathy  Plan: 1. Toenails 1-5 b/l were debrided in length and girth without iatrogenic bleeding. 2. For tinea pedis, prescription sent to pharmacy for Ketoconazole Cream 2% to be applied to both feet and between toes qd x 6 weeks. 3. Callus pared submetatarsal head(s) 5 right foot utilizing sterile scalpel blade without incident .Corn(s) pared b/l 3rd digits utilizing sterile scalpel blade without incident. Dispensed silicone toe tunnel for separation of left 3rd/4th digits to prevent formation of interdigital corn.  4. Patient to continue soft, supportive shoe gear daily. 5. Patient to report any pedal injuries to medical professional immediately. 6. Follow up 3 months.  7. Patient/POA to call should there be a concern in the interim.ket

## 2018-08-19 NOTE — Patient Instructions (Addendum)
Corns and Calluses Corns are small areas of thickened skin that occur on the top, sides, or tip of a toe. They contain a cone-shaped core with a point that can press on a nerve below. This causes pain.  Calluses are areas of thickened skin that can occur anywhere on the body, including the hands, fingers, palms, soles of the feet, and heels. Calluses are usually larger than corns. What are the causes? Corns and calluses are caused by rubbing (friction) or pressure, such as from shoes that are too tight or do not fit properly. What increases the risk? Corns are more likely to develop in people who have misshapen toes (toe deformities), such as hammer toes. Calluses can occur with friction to any area of the skin. They are more likely to develop in people who:  Work with their hands.  Wear shoes that fit poorly, are too tight, or are high-heeled.  Have toe deformities. What are the signs or symptoms? Symptoms of a corn or callus include:  A hard growth on the skin.  Pain or tenderness under the skin.  Redness and swelling.  Increased discomfort while wearing tight-fitting shoes, if your feet are affected. If a corn or callus becomes infected, symptoms may include:  Redness and swelling that gets worse.  Pain.  Fluid, blood, or pus draining from the corn or callus. How is this diagnosed? Corns and calluses may be diagnosed based on your symptoms, your medical history, and a physical exam. How is this treated? Treatment for corns and calluses may include:  Removing the cause of the friction or pressure. This may involve: ? Changing your shoes. ? Wearing shoe inserts (orthotics) or other protective layers in your shoes, such as a corn pad. ? Wearing gloves.  Applying medicine to the skin (topical medicine) to help soften skin in the hardened, thickened areas.  Removing layers of dead skin with a file to reduce the size of the corn or callus.  Removing the corn or callus with a  scalpel or laser.  Taking antibiotic medicines, if your corn or callus is infected.  Having surgery, if a toe deformity is the cause. Follow these instructions at home:   Take over-the-counter and prescription medicines only as told by your health care provider.  If you were prescribed an antibiotic, take it as told by your health care provider. Do not stop taking it even if your condition starts to improve.  Wear shoes that fit well. Avoid wearing high-heeled shoes and shoes that are too tight or too loose.  Wear any padding, protective layers, gloves, or orthotics as told by your health care provider.  Soak your hands or feet and then use a file or pumice stone to soften your corn or callus. Do this as told by your health care provider.  Check your corn or callus every day for symptoms of infection. Contact a health care provider if you:  Notice that your symptoms do not improve with treatment.  Have redness or swelling that gets worse.  Notice that your corn or callus becomes painful.  Have fluid, blood, or pus coming from your corn or callus.  Have new symptoms. Summary  Corns are small areas of thickened skin that occur on the top, sides, or tip of a toe.  Calluses are areas of thickened skin that can occur anywhere on the body, including the hands, fingers, palms, and soles of the feet. Calluses are usually larger than corns.  Corns and calluses are caused by   rubbing (friction) or pressure, such as from shoes that are too tight or do not fit properly.  Treatment may include wearing any padding, protective layers, gloves, or orthotics as told by your health care provider. This information is not intended to replace advice given to you by your health care provider. Make sure you discuss any questions you have with your health care provider. Document Released: 12/10/2003 Document Revised: 01/16/2017 Document Reviewed: 01/16/2017 Elsevier Interactive Patient Education   2019 [employer]  Athlete's foot (tinea pedis) is a fungal infection of the skin on your feet. It often occurs on the skin that is between or underneath your toes. It can also occur on the soles of your feet. Symptoms include itchy or white and flaky areas on the skin. The infection can spread from person to person (is contagious). It can also spread when a person's bare feet come in contact with the fungus on shower floors or on items such as shoes. Follow these instructions at home: Medicines Apply or take over-the-counter and prescription medicines only as told by your doctor. Apply your antifungal medicine as told by your doctor. Do not stop using the medicine even if your feet start to get better. Foot care Do not scratch your feet. Keep your feet dry: Wear cotton or wool socks. Change your socks every day or if they become wet. Wear shoes that allow air to move around, such as sandals or canvas tennis shoes. Wash and dry your feet: Every day or as told by your doctor. After exercising. Including the area between your toes. General instructions Do not share any of these items that touch your feet: Towels. Shoes. Nail clippers. Other personal items. Protect your feet by wearing sandals in wet areas, such as locker rooms and shared showers. Keep all follow-up visits as told by your doctor. This is important. If you have diabetes, keep your blood sugar under control. Contact a doctor if: You have a fever. You have swelling, pain, warmth, or redness in your foot. Your feet are not getting better with treatment. Your symptoms get worse. You have new symptoms. Summary Athlete's foot is a fungal infection of the skin on your feet. Symptoms include itchy or white and flaky areas on the skin. Apply your antifungal medicine as told by your doctor. Keep your feet clean and dry. This information is not intended to replace advice given to you by your health care  provider. Make sure you discuss any questions you have with your health care provider. Document Released: 08/22/2007 Document Revised: 12/24/2016 Document Reviewed: 12/24/2016 Elsevier Interactive Patient Education  2019 [employer].

## 2018-08-23 ENCOUNTER — Encounter: Payer: Self-pay | Admitting: Podiatry

## 2018-08-28 ENCOUNTER — Ambulatory Visit: Payer: Medicare Other | Admitting: Podiatry

## 2018-09-01 ENCOUNTER — Ambulatory Visit: Payer: Medicare Other | Admitting: Podiatry

## 2018-10-22 ENCOUNTER — Telehealth: Payer: Self-pay | Admitting: Cardiovascular Disease

## 2018-10-22 NOTE — Telephone Encounter (Signed)
Returned call to patient of Dr. Oval Linsey who reports aching from under her ears down to her shoulders on both sides. This has been going on for several months. She saw her dentist last week - and it was determined it was not a dental problem so she thinks it is a cardiac problems. She read in the literature that some women have an ache in their throat or from their "sinus down" that could be concerning for a heart attack. She would like a doctor's opinion on this.   She denies chest pain, SOB, lightheadedness, dizziness  Explained Dr. Oval Linsey is out of the office on leave so I will send to one of her partners for review and she will receive a follow up call

## 2018-10-22 NOTE — Telephone Encounter (Signed)
Spoke with patient who agrees w/virtual visit with MD. Scheduled for 10/23/18 @ 0800. Advised that she have BP/HR/weight, med list and any questions for MD readily available at or a little before appointment time. She was notified she will receive a call prior to 8am to review these items.

## 2018-10-22 NOTE — Telephone Encounter (Signed)
Please arrange a virtual visit with me to discuss further, if she has concerning symptoms she should present to her nearest emergency department.

## 2018-10-22 NOTE — Telephone Encounter (Signed)
New message   Patient states that she is having an ache from under her ear down to the shoulder on both sides. Please call the patient.

## 2018-10-23 ENCOUNTER — Encounter: Payer: Self-pay | Admitting: Internal Medicine

## 2018-10-23 ENCOUNTER — Telehealth (INDEPENDENT_AMBULATORY_CARE_PROVIDER_SITE_OTHER): Payer: Medicare Other | Admitting: Internal Medicine

## 2018-10-23 VITALS — BP 152/83 | HR 56 | Temp 97.5°F | Ht 62.0 in | Wt 128.0 lb

## 2018-10-23 DIAGNOSIS — I2510 Atherosclerotic heart disease of native coronary artery without angina pectoris: Secondary | ICD-10-CM | POA: Diagnosis not present

## 2018-10-23 DIAGNOSIS — I10 Essential (primary) hypertension: Secondary | ICD-10-CM

## 2018-10-23 DIAGNOSIS — I208 Other forms of angina pectoris: Secondary | ICD-10-CM | POA: Diagnosis not present

## 2018-10-23 NOTE — Progress Notes (Signed)
Virtual Visit via Telephone Note   This visit type was conducted due to national recommendations for restrictions regarding the COVID-19 Pandemic (e.g. social distancing) in an effort to limit this patient's exposure and mitigate transmission in our community.  Due to her co-morbid illnesses, this patient is at least at moderate risk for complications without adequate follow up.  This format is felt to be most appropriate for this patient at this time.  The patient did not have access to video technology/had technical difficulties with video requiring transitioning to audio format only (telephone).  All issues noted in this document were discussed and addressed.  No physical exam could be performed with this format.  Please refer to the patient's chart for her  consent to telehealth for [facility].   Date:  10/23/2018   ID:  Jennifer Peck, DOB 1922-11-19, MRN [id number]  Patient Location: Home Provider Location: Home  PCP:  Tammi Sou, MD  Cardiologist:  No primary care provider on file.  Electrophysiologist:  None   Evaluation Performed:  Follow-Up Visit  Chief Complaint:  Bilateral jaw and neck pain, concern for anginal equivalent.  History of Present Illness:    Jennifer Peck is a 85 y.o. female with CAD s/p PCI, hypertension and hyperlipidemia who presents for follow-up.  Jennifer Peck was previously patient of [name] and now a patient of my partner Dr. Oval Linsey. She last saw her 10/2017.    She had a bare metal stents placed in her left circumflex, RCA, OM 1 and OM 2 vessels in 2011.  She has not tolerated statin medications or Zetia in the past due to myalgias.  She has tried to manage her lipids with diet and exercise.  She was not interested in trying a PCSK9 inhibitor.  Prior to the COVID 19 pandemic she was exercising 3 days a week. She has not been able to do that since the quarantine. She notes that since Christmas time 2019 she has had discomfort going from her  bilateral earlobes down to her shoulders. She is concerned because she has heard and read that women can present differently than males with angina. She had this evaluated with her dentist and no concerning findings were noted.  She notices the discomfort when she turns her head left or right, it makes it feel stiff. She feels this could be arthritis given that she has arthritis in her feet as well.   She had an abnormal stress test in 2011 and had 3 stents placed. No preceding MI.   The patient denies typical chest pain, chest pressure, dyspnea at rest or with exertion, palpitations, PND, orthopnea, or leg swelling. Denies syncope or presyncope. Denies dizziness or lightheadedness. Denies snoring and has not been evaluated for sleep apnea.   The patient does not have symptoms concerning for COVID-19 infection (fever, chills, cough, or new shortness of breath).    Past Medical History:  Diagnosis Date   Allergic state 04/30/2011   Anxiety    BCC (basal cell carcinoma), face 11/06/2010   Bilateral bunions 11/06/2010   Constipation 11/06/2010   Coronary artery disease 2011   BMS x 3   GERD (gastroesophageal reflux disease) 05/11/2012   History of hyperkalemia 01/23/2011   Hyperlipidemia    Intolerant of statins and zetia.  Refused to consider PCSK9 inhibitor initially but as of 02/2017 cardiol f/u she would consider repatha if approved.   Hypertension    Lesion of labia 02/04/2012   Melanoma in situ ([place])  08/2016   Derm= Dr. Renda Rolls @ [facility] in [place].   Osteoarthritis of both knees    R>L   Osteopenia 11/23/2010; 02/2016   2017 T-score -1.9.  Repeat DEXA 2 yrs.   Peripheral neuropathy 11/06/2010   Small fiber, non-diabetic per neurologist   Right lumbar radiculopathy 01/2015   Improved with PT   SCC (squamous cell carcinoma), arm 11/06/2010   Seborrheic keratosis 04/30/2011   Vertigo    Past Surgical History:  Procedure Laterality Date   CARDIAC  CATHETERIZATION  12/20/2009   3 bare metal stents   DEXA  03/05/2016   T-score -1.9   EYE SURGERY     cataract- left eye 09-24-11   SKIN BIOPSY  08/2012   Shave excision of lesion on pt's back: irritated seb keratosis.   TRANSTHORACIC ECHOCARDIOGRAM  12/05/2009   Mild LVH, normal systolic fxn, mild impaired relaxation.  No signif valvular dz.   TUBAL LIGATION       Current Meds  Medication Sig   ALPRAZolam (XANAX) 0.25 MG tablet Take 1 tablet (0.25 mg total) by mouth daily as needed. Panic attack   amLODipine (NORVASC) 2.5 MG tablet Take 1 tablet (2.5 mg total) by mouth daily.   aspirin 81 MG tablet Take 81 mg by mouth daily.   azelastine (OPTIVAR) 0.05 % ophthalmic solution INSTILL TWO DROPS INTO EACH EYE TWICE DAILY   Benfotiamine 150 MG CAPS Take 1 capsule by mouth daily. For numbness in feet   Cyanocobalamin (VITAMIN B-12 CR PO) Take 1 tablet by mouth daily.    doxycycline (VIBRA-TABS) 100 MG tablet 2 tabs po x 1 dose   enalapril (VASOTEC) 20 MG tablet TAKE 1 TABLET BY MOUTH IN THE MORNING AND 1/2 (ONE-HALF) AT BEDTIME   fluocinonide (LIDEX) 0.05 % external solution Apply 1 application topically daily as needed.   ketoconazole (NIZORAL) 2 % cream Apply 1 application topically daily. Apply to both feet and between toes once daily for 6 weeks   ketoconazole (NIZORAL) 2 % shampoo    magnesium oxide (MAG-OX) 400 MG tablet Take 400 mg by mouth once a week.    metoprolol succinate (TOPROL-XL) 25 MG 24 hr tablet TAKE 1 TABLET BY MOUTH ONCE DAILY   Multiple Vitamin (MULTIVITAMIN) tablet Take 1 tablet by mouth daily. Takes occ   Naftifine HCl (NAFTIN) 2 % GEL Apply to affected area qd-bid   Omega-3 Fatty Acids (FISH OIL PO) Take 1 capsule by mouth daily.      Allergies:   Chlorthalidone, Atenolol, Besivance [besifloxacin hcl], Codeine, Crestor [rosuvastatin], Gabapentin, Lipitor [atorvastatin], Lovastatin, Simvastatin, Sulfa antibiotics, Vigamox [moxifloxacin], and  Welchol [colesevelam hcl]   Social History   Tobacco Use   Smoking status: Former Smoker    Quit date: 09/04/1977    Years since quitting: 41.1   Smokeless tobacco: Never Used  Substance Use Topics   Alcohol use: Yes    Comment: only on Holidays   Drug use: No     Family Hx: The patient's family history includes Asthma in her paternal grandfather; Cancer in her daughter; Heart disease in her father; Hyperlipidemia in her daughter and son; Hypertension in her daughter, daughter, son, son, and son; Stroke in her mother and paternal grandmother; Vision loss in her maternal grandfather.  ROS:   Please see the history of present illness.     All other systems reviewed and are negative.   Prior CV studies:   The following studies were reviewed today:    Labs/Other Tests and  Data Reviewed:    EKG:  No ECG reviewed.  Recent Labs: 10/23/2017: ALT 16; Hemoglobin 14.8; Platelets 177.0 01/28/2018: BUN 16; Creatinine, Ser 0.76; Potassium 4.6; Sodium 136   Recent Lipid Panel Lab Results  Component Value Date/Time   CHOL 232 (H) 01/30/2017 09:04 AM   TRIG 98.0 01/30/2017 09:04 AM   HDL 68.80 01/30/2017 09:04 AM   CHOLHDL 3 01/30/2017 09:04 AM   LDLCALC 144 (H) 01/30/2017 09:04 AM   LDLDIRECT 128.0 12/02/2012 08:37 AM    Wt Readings from Last 3 Encounters:  10/23/18 128 lb (58.1 kg)  05/16/18 132 lb (59.9 kg)  01/28/18 128 lb 8 oz (58.3 kg)     Objective:    Vital Signs:  BP (!) 152/83    Pulse (!) 56    Temp (!) 97.5 F (36.4 C)    Ht 5\' 2"  (1.575 m)    Wt 128 lb (58.1 kg)    BMI 23.41 kg/m    114/69 P60 115/69 P59  VITAL SIGNS:  reviewed GEN:  no acute distress  ASSESSMENT & PLAN:    1. Anginal equivalent ([place])   2. CAD in native artery   3. Essential (primary) hypertension    Anginal equivalent with history of CAD - I have offered the patient a lexiscan myoview stress test to evaluate her atypical discomfort further given her concern that it is related to  her heart and she has a history of CAD. We participated in shared decision making, and she would like to observe this without stress testing at this time. She would like to try to get back to her usual activities since she feels inactivity may be playing a roll in her stiffness in the bilateral neck and shoulder region. This is reasonable, however I have asked her to have a low threshold to call us and pursue the stress test. She understands.   HTN - blood pressure she feels is higher than normal, and not representative of her daily readings. We will avoid making changes today but have her follow up in 6 weeks and if still elevated at that time we can discuss further uptitration.   ADDENDUM:   On speaking to Trixie Dredge, RN for AVS instructions, patient chose to pursue stress testing. We will order this to be performed at her next follow up per patient request.    COVID-19 Education: The signs and symptoms of COVID-19 were discussed with the patient and how to seek care for testing (follow up with PCP or arrange E-visit).  The importance of social distancing was discussed today.  Time:   Today, I have spent 25 minutes with the patient with telehealth technology discussing the above problems.     Medication Adjustments/Labs and Tests Ordered: Current medicines are reviewed at length with the patient today.  Concerns regarding medicines are outlined above.   Tests Ordered: No orders of the defined types were placed in this encounter.   Medication Changes: No orders of the defined types were placed in this encounter.   Signed, Elouise Munroe, MD  10/23/2018 8:07 AM    [place]

## 2018-10-23 NOTE — Patient Instructions (Addendum)
Medication Instructions:   No changes     Lab work:  NOT NEEDED   Testing/Procedure  WILL SCHEDULE AT [place] 28 ,2020 PATIENT HAS TRANSPORTATION  ISSUES Your physician has requested that you have a lexiscan myoview. For further information please visit [email or web address]. Please follow instruction sheet, as given.   Follow-Up: At [facility], you and your health needs are our priority.  As part of our continuing mission to provide you with exceptional heart care, we have created designated Provider Care Teams.  These Care Teams include your primary Cardiologist (physician) and Advanced Practice Providers (APPs -  Physician Assistants and Nurse Practitioners) who all work together to provide you with the care you need, when you need it. . You will need a follow up appointment in   6 weeks.     . Dr Skeet Latch- Your physician recommends that you schedule a follow-up appointment in Oct 2,2020 AT 9:40 AM    Any Other Special Instructions Will Be Listed Below (If Applicable).

## 2018-11-25 ENCOUNTER — Ambulatory Visit: Payer: Medicare Other | Admitting: Podiatry

## 2018-12-08 ENCOUNTER — Other Ambulatory Visit: Payer: Self-pay | Admitting: Family Medicine

## 2018-12-08 ENCOUNTER — Telehealth: Payer: Medicare Other | Admitting: Adult Health

## 2018-12-15 ENCOUNTER — Ambulatory Visit (INDEPENDENT_AMBULATORY_CARE_PROVIDER_SITE_OTHER): Payer: Medicare Other | Admitting: Family Medicine

## 2018-12-15 ENCOUNTER — Encounter: Payer: Self-pay | Admitting: Family Medicine

## 2018-12-15 ENCOUNTER — Other Ambulatory Visit: Payer: Self-pay

## 2018-12-15 ENCOUNTER — Encounter (HOSPITAL_COMMUNITY): Payer: Medicare Other

## 2018-12-15 VITALS — BP 156/74 | HR 75 | Temp 98.7°F | Resp 16 | Ht 61.0 in | Wt 134.3 lb

## 2018-12-15 DIAGNOSIS — I10 Essential (primary) hypertension: Secondary | ICD-10-CM | POA: Diagnosis not present

## 2018-12-15 DIAGNOSIS — I890 Lymphedema, not elsewhere classified: Secondary | ICD-10-CM | POA: Diagnosis not present

## 2018-12-15 DIAGNOSIS — I2510 Atherosclerotic heart disease of native coronary artery without angina pectoris: Secondary | ICD-10-CM | POA: Diagnosis not present

## 2018-12-15 DIAGNOSIS — Z23 Encounter for immunization: Secondary | ICD-10-CM | POA: Diagnosis not present

## 2018-12-15 DIAGNOSIS — Z8639 Personal history of other endocrine, nutritional and metabolic disease: Secondary | ICD-10-CM

## 2018-12-15 DIAGNOSIS — E7800 Pure hypercholesterolemia, unspecified: Secondary | ICD-10-CM

## 2018-12-15 DIAGNOSIS — M542 Cervicalgia: Secondary | ICD-10-CM

## 2018-12-15 LAB — COMPREHENSIVE METABOLIC PANEL
ALT: 12 U/L (ref 0–35)
AST: 11 U/L (ref 0–37)
Albumin: 4.7 g/dL (ref 3.5–5.2)
Alkaline Phosphatase: 59 U/L (ref 39–117)
BUN: 13 mg/dL (ref 6–23)
CO2: 27 mEq/L (ref 19–32)
Calcium: 10.3 mg/dL (ref 8.4–10.5)
Chloride: 99 mEq/L (ref 96–112)
Creatinine, Ser: 0.75 mg/dL (ref 0.40–1.20)
GFR: 71.94 mL/min (ref >60.00)
Glucose, Bld: 106 mg/dL — ABNORMAL HIGH (ref 70–99)
Potassium: 4.4 mEq/L (ref 3.5–5.1)
Sodium: 137 mEq/L (ref 135–145)
Total Bilirubin: 0.6 mg/dL (ref 0.2–1.2)
Total Protein: 7.4 g/dL (ref 6.0–8.3)

## 2018-12-15 NOTE — Progress Notes (Signed)
OFFICE VISIT  12/15/2018   CC:  Chief Complaint  Patient presents with  . Follow-up    RCI.   Marland Kitchen Leg Swelling    pain has swelling in both legs and loss of feeling in both legs and feet   HPI:    Patient is a 85 y.o. Caucasian female who presents for 6 mo f/u HTN and hx of hyperkalemia.  Interim hx: 10/23/18 cardiology eval for suspected anginal equivalent: cardiologist recommended a lexiscan stress test and she eventually agreed and it appears this test is ordered but a note on the order says "this test can only be done 12/15/18 b/c of transportation issues".  She tells me she has chosen not to get this b/c she is afraid of this test and did not want to get it w/out being examined in person first. She has appt --telephone--with her primary cardiologist, Dr. Oval Linsey, in 4d. Her symptoms that have her worried about possible anginal equivalent are pains going down muscles of the posterolateral neck.  No exertional trigger.  No jaw pain.  No arm or CP.  No SOB or nausea or diaphoresis.  She complains of getting lots of little bruises on LL's, has long hx of decreased sensation in her feet for years from peripheral neuropathy.  Her LL's have venous insufficiency edema on/off, has some skin changes c/w with this.  No claudication.  HTN: home bp's consistently 120s/70, HR avg 60. Gets nervous at MD offices.  Hx of hyperkalemia: eats low K diet.  ROS: no CP, no SOB, no wheezing, no cough, no dizziness, no HAs,no melena/hematochezia.  No polyuria or polydipsia.    Past Medical History:  Diagnosis Date  . Allergic state 04/30/2011  . Anxiety   . BCC (basal cell carcinoma), face 11/06/2010  . Bilateral bunions 11/06/2010  . Constipation 11/06/2010  . Coronary artery disease 2011   BMS x 3  . GERD (gastroesophageal reflux disease) 05/11/2012  . History of hyperkalemia 01/23/2011  . Hyperlipidemia    Intolerant of statins and zetia.  Refused to consider PCSK9 inhibitor initially but as of 02/2017  cardiol f/u she would consider repatha if approved.  . Hypertension   . Lesion of labia 02/04/2012  . Melanoma in situ [facility]) 08/2016   Derm= Dr. Renda Rolls [facility] [place].  . Osteoarthritis of both knees    R>L  . Osteopenia 11/23/2010; 02/2016   2017 T-score -1.9.  Repeat DEXA 2 yrs.  . Peripheral neuropathy 11/06/2010   Small fiber, non-diabetic per neurologist  . Right lumbar radiculopathy 01/2015   Improved with PT  . SCC (squamous cell carcinoma), arm 11/06/2010  . Seborrheic keratosis 04/30/2011  . Vertigo     Past Surgical History:  Procedure Laterality Date  . CARDIAC CATHETERIZATION  12/20/2009   3 bare metal stents  . DEXA  03/05/2016   T-score -1.9  . EYE SURGERY     cataract- left eye 09-24-11  . SKIN BIOPSY  08/2012   Shave excision of lesion on pt's back: irritated seb keratosis.  . TRANSTHORACIC ECHOCARDIOGRAM  12/05/2009   Mild LVH, normal systolic fxn, mild impaired relaxation.  No signif valvular dz.  . TUBAL LIGATION      Outpatient Medications Prior to Visit  Medication Sig Dispense Refill  . ALPRAZolam (XANAX) 0.25 MG tablet Take 1 tablet (0.25 mg total) by mouth daily as needed. Panic attack 15 tablet 0  . amLODipine (NORVASC) 2.5 MG tablet Take 1 tablet (2.5 mg total) by mouth  daily. 90 tablet 1  . aspirin 81 MG tablet Take 81 mg by mouth daily.    Marland Kitchen azelastine (OPTIVAR) 0.05 % ophthalmic solution INSTILL TWO DROPS INTO EACH EYE TWICE DAILY 6 mL 12  . Benfotiamine 150 MG CAPS Take 1 capsule by mouth daily. For numbness in feet    . Cyanocobalamin (VITAMIN B-12 CR PO) Take 1 tablet by mouth daily.     . enalapril (VASOTEC) 20 MG tablet TAKE 1 TABLET BY MOUTH IN THE MORNING AND 1/2 (ONE-HALF) AT BEDTIME 135 tablet 1  . ketoconazole (NIZORAL) 2 % shampoo     . magnesium oxide (MAG-OX) 400 MG tablet Take 400 mg by mouth once a week.     . metoprolol succinate (TOPROL-XL) 25 MG 24 hr tablet Take 1 tablet by mouth once daily 90 tablet 0  .  Multiple Vitamin (MULTIVITAMIN) tablet Take 1 tablet by mouth daily. Takes occ    . Omega-3 Fatty Acids (FISH OIL PO) Take 1 capsule by mouth daily.     Marland Kitchen doxycycline (VIBRA-TABS) 100 MG tablet 2 tabs po x 1 dose (Patient not taking: Reported on 12/15/2018) 2 tablet 0  . fluocinonide (LIDEX) 0.05 % external solution Apply 1 application topically daily as needed.    Marland Kitchen ketoconazole (NIZORAL) 2 % cream Apply 1 application topically daily. Apply to both feet and between toes once daily for 6 weeks (Patient not taking: Reported on 12/15/2018) 30 g 1  . Naftifine HCl (NAFTIN) 2 % GEL Apply to affected area qd-bid (Patient not taking: Reported on 12/15/2018) 60 g 3   No facility-administered medications prior to visit.     Allergies  Allergen Reactions  . Chlorthalidone     hyponatremia  . Atenolol     Caused fatigue   . Besivance [Besifloxacin Hcl]     rash  . Codeine Nausea And Vomiting  . Crestor [Rosuvastatin]     "leg gave away"  . Gabapentin     Memory loss and confusion  . Lipitor [Atorvastatin] Other (See Comments)    Severe leg pain  . Lovastatin     myalgia  . Simvastatin     Her leg gave away on her and states thinks secondary to Simvastatin  . Sulfa Antibiotics Nausea Only  . Vigamox [Moxifloxacin]     rash  . Welchol [Colesevelam Hcl]     ROS As per HPI  PE: Blood pressure (!) 156/74, pulse 75, temperature 98.7 F (37.1 C), temperature source Temporal, resp. rate 16, height 5\' 1"  (1.549 m), weight 134 lb 5 oz (60.9 kg), SpO2 96 %. Gen: Alert, well appearing.  Patient is oriented to person, place, time, and situation. AFFECT: pleasant, lucid thought and speech. Neck: carotids 2+ bilat, w/out bruit.  The neck mm's on both posterior aspects are very tight and mildly TTP. CV: RRR, occ ectopy, no m/r Chest is clear, no wheezing or rales. Normal symmetric air entry throughout both lung fields. No chest wall deformities or tenderness. EXT: mild generalized soft tissue  fullness but no pitting. Skin is slightly wrinkled anteriorly and she has diffuse hyperpigmentation skin changes. No open wounds, no skin distention, no tenderness. DP pulses 2+ bilat.  LABS:  Lab Results  Component Value Date   TSH 1.36 05/07/2012   Lab Results  Component Value Date   WBC 6.0 10/23/2017   HGB 14.8 10/23/2017   HCT 44.2 10/23/2017   MCV 95.0 10/23/2017   PLT 177.0 10/23/2017   Lab Results  Component Value  Date   CREATININE 0.76 01/28/2018   BUN 16 01/28/2018   NA 136 01/28/2018   K 4.6 01/28/2018   CL 99 01/28/2018   CO2 28 01/28/2018   Lab Results  Component Value Date   ALT 16 10/23/2017   AST 10 10/23/2017   ALKPHOS 47 10/23/2017   BILITOT 0.6 10/23/2017   Lab Results  Component Value Date   CHOL 232 (H) 01/30/2017   Lab Results  Component Value Date   HDL 68.80 01/30/2017   Lab Results  Component Value Date   LDLCALC 144 (H) 01/30/2017   Lab Results  Component Value Date   TRIG 98.0 01/30/2017   Lab Results  Component Value Date   CHOLHDL 3 01/30/2017   Lab Results  Component Value Date   HGBA1C  09/03/2009    5.4 (NOTE)                                                                       According to the ADA Clinical Practice Recommendations for 2011, when HbA1c is used as a screening test:   >=6.5%   Diagnostic of Diabetes Mellitus           (if abnormal result  is confirmed)  5.7-6.4%   Increased risk of developing Diabetes Mellitus  References:Diagnosis and Classification of Diabetes Mellitus,Diabetes [id number] 1):S62-S69 and Standards of Medical Care in         Diabetes - 2011,Diabetes Care,2011,34  (Suppl 1):S11-S61.    IMPRESSION AND PLAN:  1) HTN: The current medical regimen is effective;  continue present plan and medications. Lytes/cr today.  2) Hx of hyperkalemia: low K diet. BMET today.  3) Bilat LL venous insufficiency dz, lymphedema, along with chronic small fiber peripheral neuropathy. I reassured  her that her skin changes will wax and wane and that the only thing that may make her legs feel better from a swelling standpoint of swelling is compression stockings so I rx'd these for her today.  4) Bilat posterior neck soft tissue pain: I reassured her that I do not think her sx's have anything to do with her heart, but encouraged her to keep her already scheduled appt with her cardiologist coming up soon.  An After Visit Summary was printed and given to the patient.  FOLLOW UP: Return in about 6 months (around 06/14/2019) for routine chronic illness f/u.  Signed:  Crissie Sickles, MD           12/15/2018

## 2018-12-17 ENCOUNTER — Telehealth: Payer: Self-pay | Admitting: Family Medicine

## 2018-12-17 NOTE — Telephone Encounter (Signed)
Patient called to ask where she should take her Rx for her stocking to. Please call her.

## 2018-12-17 NOTE — Telephone Encounter (Signed)
Contacted patient and gave her some options of medical supply companies close to her home.

## 2018-12-19 ENCOUNTER — Encounter: Payer: Medicare Other | Admitting: Cardiovascular Disease

## 2018-12-22 ENCOUNTER — Ambulatory Visit (INDEPENDENT_AMBULATORY_CARE_PROVIDER_SITE_OTHER): Payer: Medicare Other | Admitting: Podiatry

## 2018-12-22 ENCOUNTER — Encounter: Payer: Self-pay | Admitting: Podiatry

## 2018-12-22 ENCOUNTER — Other Ambulatory Visit: Payer: Self-pay

## 2018-12-22 DIAGNOSIS — B351 Tinea unguium: Secondary | ICD-10-CM

## 2018-12-22 DIAGNOSIS — M79675 Pain in left toe(s): Secondary | ICD-10-CM

## 2018-12-22 DIAGNOSIS — M79674 Pain in right toe(s): Secondary | ICD-10-CM | POA: Diagnosis not present

## 2018-12-22 DIAGNOSIS — L84 Corns and callosities: Secondary | ICD-10-CM

## 2018-12-22 DIAGNOSIS — G629 Polyneuropathy, unspecified: Secondary | ICD-10-CM | POA: Diagnosis not present

## 2018-12-22 NOTE — Patient Instructions (Signed)
Peripheral Neuropathy Peripheral neuropathy is a type of nerve damage. It affects nerves that carry signals between the spinal cord and the arms, legs, and the rest of the body (peripheral nerves). It does not affect nerves in the spinal cord or brain. In peripheral neuropathy, one nerve or a group of nerves may be damaged. Peripheral neuropathy is a broad category that includes many specific nerve disorders, like diabetic neuropathy, hereditary neuropathy, and carpal tunnel syndrome. What are the causes? This condition may be caused by:  Diabetes. This is the most common cause of peripheral neuropathy.  Nerve injury.  Pressure or stress on a nerve that lasts a long time.  Lack (deficiency) of B vitamins. This can result from alcoholism, poor diet, or a restricted diet.  Infections.  Autoimmune diseases, such as rheumatoid arthritis and systemic lupus erythematosus.  Nerve diseases that are passed from parent to child (inherited).  Some medicines, such as cancer medicines (chemotherapy).  Poisonous (toxic) substances, such as lead and mercury.  Too little blood flowing to the legs.  Kidney disease.  Thyroid disease. In some cases, the cause of this condition is not known. What are the signs or symptoms? Symptoms of this condition depend on which of your nerves is damaged. Common symptoms include:  Loss of feeling (numbness) in the feet, hands, or both.  Tingling in the feet, hands, or both.  Burning pain.  Very sensitive skin.  Weakness.  Not being able to move a part of the body (paralysis).  Muscle twitching.  Clumsiness or poor coordination.  Loss of balance.  Not being able to control your bladder.  Feeling dizzy.  Sexual problems. How is this diagnosed? Diagnosing and finding the cause of peripheral neuropathy can be difficult. Your health care provider will take your medical history and do a physical exam. A neurological exam will also be done. This  involves checking things that are affected by your brain, spinal cord, and nerves (nervous system). For example, your health care provider will check your reflexes, how you move, and what you can feel. You may have other tests, such as:  Blood tests.  Electromyogram (EMG) and nerve conduction tests. These tests check nerve function and how well the nerves are controlling the muscles.  Imaging tests, such as CT scans or MRI to rule out other causes of your symptoms.  Removing a small piece of nerve to be examined in a lab (nerve biopsy). This is rare.  Removing and examining a small amount of the fluid that surrounds the brain and spinal cord (lumbar puncture). This is rare. How is this treated? Treatment for this condition may involve:  Treating the underlying cause of the neuropathy, such as diabetes, kidney disease, or vitamin deficiencies.  Stopping medicines that can cause neuropathy, such as chemotherapy.  Medicine to relieve pain. Medicines may include: ? Prescription or over-the-counter pain medicine. ? Antiseizure medicine. ? Antidepressants. ? Pain-relieving patches that are applied to painful areas of skin.  Surgery to relieve pressure on a nerve or to destroy a nerve that is causing pain.  Physical therapy to help improve movement and balance.  Devices to help you move around (assistive devices). Follow these instructions at home: Medicines  Take over-the-counter and prescription medicines only as told by your health care provider. Do not take any other medicines without first asking your health care provider.  Do not drive or use heavy machinery while taking prescription pain medicine. Lifestyle   Do not use any products that contain nicotine   or tobacco, such as cigarettes and e-cigarettes. Smoking keeps blood from reaching damaged nerves. If you need help quitting, ask your health care provider.  Avoid or limit alcohol. Too much alcohol can cause a vitamin B  deficiency, and vitamin B is needed for healthy nerves.  Eat a healthy diet. This includes: ? Eating foods that are high in fiber, such as fresh fruits and vegetables, whole grains, and beans. ? Limiting foods that are high in fat and processed sugars, such as fried or sweet foods. General instructions   If you have diabetes, work closely with your health care provider to keep your blood sugar under control.  If you have numbness in your feet: ? Check every day for signs of injury or infection. Watch for redness, warmth, and swelling. ? Wear padded socks and comfortable shoes. These help protect your feet.  Develop a good support system. Living with peripheral neuropathy can be stressful. Consider talking with a mental health specialist or joining a support group.  Use assistive devices and attend physical therapy as told by your health care provider. This may include using a walker or a cane.  Keep all follow-up visits as told by your health care provider. This is important. Contact a health care provider if:  You have new signs or symptoms of peripheral neuropathy.  You are struggling emotionally from dealing with peripheral neuropathy.  Your pain is not well-controlled. Get help right away if:  You have an injury or infection that is not healing normally.  You develop new weakness in an arm or leg.  You fall frequently. Summary  Peripheral neuropathy is when the nerves in the arms, or legs are damaged, resulting in numbness, weakness, or pain.  There are many causes of peripheral neuropathy, including diabetes, pinched nerves, vitamin deficiencies, autoimmune disease, and hereditary conditions.  Diagnosing and finding the cause of peripheral neuropathy can be difficult. Your health care provider will take your medical history, do a physical exam, and do tests, including blood tests and nerve function tests.  Treatment involves treating the underlying cause of the  neuropathy and taking medicines to help control pain. Physical therapy and assistive devices may also help. This information is not intended to replace advice given to you by your health care provider. Make sure you discuss any questions you have with your health care provider. Document Released: 02/23/2002 Document Revised: 02/15/2017 Document Reviewed: 05/14/2016 Elsevier Patient Education  [place]  Athlete's foot (tinea pedis) is a fungal infection of the skin on your feet. It often occurs on the skin that is between or underneath the toes. It can also occur on the soles of your feet. The infection can spread from person to person (is contagious). It can also spread when a person's bare feet come in contact with the fungus on shower floors or on items such as shoes. What are the causes? This condition is caused by a fungus that grows in warm, moist places. You can get athlete's foot by sharing shoes, shower stalls, towels, and wet floors with someone who is infected. Not washing your feet or changing your socks often enough can also lead to athlete's foot. What increases the risk? This condition is more likely to develop in:  Men.  People who have a weak body defense system (immune system).  People who have diabetes.  People who use public showers, such as at a gym.  People who wear heavy-duty shoes, such as Environmental manager.  Seasons with warm, humid weather. What are the signs or symptoms? Symptoms of this condition include:  Itchy areas between your toes or on the soles of your feet.  White, flaky, or scaly areas between your toes or on the soles of your feet.  Very itchy small blisters between your toes or on the soles of your feet.  Small cuts in your skin. These cuts can become infected.  Thick or discolored toenails. How is this diagnosed? This condition may be diagnosed with a physical exam and a review of your medical history.  Your health care provider may also take a skin or toenail sample to examine under a microscope. How is this treated? This condition is treated with antifungal medicines. These may be applied as powders, ointments, or creams. In severe cases, an oral antifungal medicine may be given. Follow these instructions at home: Medicines  Apply or take over-the-counter and prescription medicines only as told by your health care provider.  Apply your antifungal medicine as told by your health care provider. Do not stop using the antifungal even if your condition improves. Foot care  Do not scratch your feet.  Keep your feet dry: ? Wear cotton or wool socks. Change your socks every day or if they become wet. ? Wear shoes that allow air to flow, such as sandals or canvas tennis shoes.  Wash and dry your feet, including the area between your toes. Also, wash and dry your feet: ? Every day or as told by your health care provider. ? After exercising. General instructions  Do not let others use towels, shoes, nail clippers, or other personal items that touch your feet.  Protect your feet by wearing sandals in wet areas, such as locker rooms and shared showers.  Keep all follow-up visits as told by your health care provider. This is important.  If you have diabetes, keep your blood sugar under control. Contact a health care provider if:  You have a fever.  You have swelling, soreness, warmth, or redness in your foot.  Your feet are not getting better with treatment.  Your symptoms get worse.  You have new symptoms. Summary  Athlete's foot (tinea pedis) is a fungal infection of the skin on your feet. It often occurs on skin that is between or underneath the toes.  This condition is caused by a fungus that grows in warm, moist places.  Symptoms include white, flaky, or scaly areas between your toes or on the soles of your feet.  This condition is treated with antifungal medicines.  Keep  your feet clean. Always dry them thoroughly. This information is not intended to replace advice given to you by your health care provider. Make sure you discuss any questions you have with your health care provider. Document Released: 03/02/2000 Document Revised: 02/28/2017 Document Reviewed: 12/24/2016 Elsevier Patient Education  [place]? An infection that lies within the keratin of your nail plate that is caused by a fungus.  WHY ME? Fungal infections affect all ages, sexes, races, and creeds.  There may be many factors that predispose you to a fungal infection such as age, coexisting medical conditions such as diabetes, or an autoimmune disease; stress, medications, fatigue, genetics, etc.  Bottom line: fungus thrives in a warm, moist environment and your shoes offer such a location.  IS IT CONTAGIOUS? Theoretically, yes.  You do not want to share shoes, nail clippers or files with someone who has fungal toenails.  Walking around barefoot in the same room or sleeping in the same bed is unlikely to transfer the organism.  It is important to realize, however, that fungus can spread easily from one nail to the next on the same foot.  HOW DO WE TREAT THIS?  There are several ways to treat this condition.  Treatment may depend on many factors such as age, medications, pregnancy, liver and kidney conditions, etc.  It is best to ask your doctor which options are available to you.  1. No treatment.   Unlike many other medical concerns, you can live with this condition.  However for many people this can be a painful condition and may lead to ingrown toenails or a bacterial infection.  It is recommended that you keep the nails cut short to help reduce the amount of fungal nail. 2. Topical treatment.  These range from herbal remedies to prescription strength nail lacquers.  About 40-50% effective, topicals require twice daily application for  approximately 9 to 12 months or until an entirely new nail has grown out.  The most effective topicals are medical grade medications available through physicians offices. 3. Oral antifungal medications.  With an 80-90% cure rate, the most common oral medication requires 3 to 4 months of therapy and stays in your system for a year as the new nail grows out.  Oral antifungal medications do require blood work to make sure it is a safe drug for you.  A liver function panel will be performed prior to starting the medication and after the first month of treatment.  It is important to have the blood work performed to avoid any harmful side effects.  In general, this medication safe but blood work is required. 4. Laser Therapy.  This treatment is performed by applying a specialized laser to the affected nail plate.  This therapy is noninvasive, fast, and non-painful.  It is not covered by insurance and is therefore, out of pocket.  The results have been very good with a 80-95% cure rate.  [facility] is the only practice in the area to offer this therapy. 5. Permanent Nail Avulsion.  Removing the entire nail so that a new nail will not grow back.

## 2018-12-25 NOTE — Progress Notes (Signed)
Subjective:  Jennifer Peck presents to clinic today with cc of  painful, thick, discolored, elongated toenails 1-5 b/l that become tender and cannot cut because of thickness. Pain is aggravated when wearing enclosed shoe gear.  McGowen, Adrian Blackwater, MD is her PCP.   Current Outpatient Medications on File Prior to Visit  Medication Sig Dispense Refill  . ALPRAZolam (XANAX) 0.25 MG tablet Take 1 tablet (0.25 mg total) by mouth daily as needed. Panic attack 15 tablet 0  . amLODipine (NORVASC) 2.5 MG tablet Take 1 tablet (2.5 mg total) by mouth daily. 90 tablet 1  . aspirin 81 MG tablet Take 81 mg by mouth daily.    Marland Kitchen azelastine (OPTIVAR) 0.05 % ophthalmic solution INSTILL TWO DROPS INTO EACH EYE TWICE DAILY 6 mL 12  . Benfotiamine 150 MG CAPS Take 1 capsule by mouth daily. For numbness in feet    . Cyanocobalamin (VITAMIN B-12 CR PO) Take 1 tablet by mouth once a week.     . enalapril (VASOTEC) 20 MG tablet TAKE 1 TABLET BY MOUTH IN THE MORNING AND 1/2 (ONE-HALF) AT BEDTIME 135 tablet 1  . fluocinonide (LIDEX) 0.05 % external solution Apply 1 application topically daily as needed.    Marland Kitchen ketoconazole (NIZORAL) 2 % shampoo     . magnesium oxide (MAG-OX) 400 MG tablet Take 400 mg by mouth once a week.     . metoprolol succinate (TOPROL-XL) 25 MG 24 hr tablet Take 1 tablet by mouth once daily 90 tablet 0  . Multiple Vitamin (MULTIVITAMIN) tablet Take 1 tablet by mouth once a week. Takes occ    . Naftifine HCl (NAFTIN) 2 % GEL Apply to affected area qd-bid (Patient taking differently: as needed. Apply to affected area qd-bid) 60 g 3  . Omega-3 Fatty Acids (FISH OIL PO) Take 1 capsule by mouth once a week.      No current facility-administered medications on file prior to visit.      Allergies  Allergen Reactions  . Chlorthalidone     hyponatremia  . Atenolol     Caused fatigue   . Besivance [Besifloxacin Hcl]     rash  . Codeine Nausea And Vomiting  . Crestor [Rosuvastatin]     "leg gave  away"  . Gabapentin     Memory loss and confusion  . Lipitor [Atorvastatin] Other (See Comments)    Severe leg pain  . Lovastatin     myalgia  . Simvastatin     Her leg gave away on her and states thinks secondary to Simvastatin  . Sulfa Antibiotics Nausea Only  . Vigamox [Moxifloxacin]     rash  . Welchol [Colesevelam Hcl]     Objective: There were no vitals filed for this visit.  Physical Examination:  Vascular Examination: Capillary refill time immediate x 10 digits.  Palpable DP/PT pulses b/l.  Digital hair present b/l.  No edema noted b/l.  Skin temperature gradient WNL b/l.  Dermatological Examination: Skin with normal turgor, texture and tone b/l.  No open wounds b/l.  No interdigital macerations noted b/l.  Elongated, thick, discolored brittle toenails with subungual debris and pain on dorsal palpation of nailbeds 1-5 b/l.  Hyperkeratotic lesion submet head 5 right foot, distal tip right 3rd digit, interdigital left 2nd laterally, interdigital right 3rd medially with tenderness to palpation. No edema, no erythema, no drainage, no flocculence.  Musculoskeletal Examination: Muscle strength 5/5 to all muscle groups b/l.  Hammertoes 2-4 b/l.  No pain, crepitus or  joint discomfort with active/passive ROM.  Neurological Examination: Sensation diminished b/ll with 10 gram monofilament.  Vibratory sensation diminished b/l.  Assessment: Mycotic nail infection with pain 1-5 b/l Corns b/l 3rd digits, left 2nd digit Callus submet head 5 left foot Peripheral neuropathy  Plan: 1. Toenails 1-5 b/l were debrided in length and girth without iatrogenic laceration. 2. Hyperkeratotic lesions pared submet head 5 left foot, b/l 3rd digits, left 2nd digit utilizing sterile scalpel blade without incident. 3. Continue soft, supportive shoe gear daily. 4. Report any pedal injuries to medical professional. 5. Follow up 3 months. 5.  Patient/POA to call should there be a  question/concern in there interim.

## 2019-01-05 ENCOUNTER — Other Ambulatory Visit: Payer: Self-pay | Admitting: Family Medicine

## 2019-01-06 ENCOUNTER — Ambulatory Visit: Payer: Medicare Other | Admitting: Family Medicine

## 2019-01-06 ENCOUNTER — Ambulatory Visit: Payer: Medicare Other

## 2019-01-07 NOTE — Progress Notes (Addendum)
Subjective:   Jennifer Peck is a 85 y.o. female who presents for Medicare Annual (Subsequent) preventive examination.   I connected with patient 01/08/2019 at 10:00 AM EDT by a video/audio enabled telemedicine application and verified that I am speaking with the correct person using two identifiers. Patient stated full name and DOB. Patient gave permission to continue with virtual visit. Patient's location was at home and Nurse's location was at [place] office.    Review of Systems:  No ROS.  Medicare Wellness Virtual Visit.  Visual/audio telehealth visit, UTA vital signs.    Cardiac Risk Factors include: advanced age (>65men, >64 women);dyslipidemia;hypertension;family history of premature cardiovascular disease   Sleep patterns: Sleeps 8 hours. Up to void x 2.  Home Safety/Smoke Alarms: Feels safe in home. Smoke alarms in place.  Living environment; residence and Firearm Safety: Lives alone in single story home, steps going in home. Rail in place. Closest family member 77 minutes away. Seat Belt Safety/Bike Helmet: Wears seat belt.   Female:   Pap-N/A     Mammo-05/20/2006, negative.Declines further testing             Dexa scan-03/06/2016, Osteopenia. Declines further testing.         CCS-Colonoscopy 05/17/2004, normal. No recall      Objective:     Vitals: BP 116/76   Pulse 76   Wt 130 lb (59 kg)   BMI 24.56 kg/m   Body mass index is 24.56 kg/m.  Advanced Directives 01/08/2019 05/06/2017 04/30/2016 05/02/2015  Does Patient Have a Medical Advance Directive? Yes Yes Yes No  Type of Paramedic of [place];Living will Living will;Healthcare Power of Attorney Living will -  Copy of [place] in Chart? No - copy requested No - copy requested - -    Tobacco Social History   Tobacco Use  Smoking Status Former Smoker  . Quit date: 09/04/1977  . Years since quitting: 41.3  Smokeless Tobacco Never Used     Counseling given: Not  Answered    Past Medical History:  Diagnosis Date  . Allergic state 04/30/2011  . Anxiety   . BCC (basal cell carcinoma), face 11/06/2010  . Bilateral bunions 11/06/2010  . Chronic venous insufficiency    lymphedema + venous stasis pigmentation changes  . Constipation 11/06/2010  . Coronary artery disease 2011   BMS x 3  . GERD (gastroesophageal reflux disease) 05/11/2012  . History of hyperkalemia 01/23/2011  . Hyperlipidemia    Intolerant of statins and zetia.  Refused to consider PCSK9 inhibitor initially but as of 02/2017 cardiol f/u she would consider repatha if approved.  . Hypertension    +white coat component  . Lesion of labia 02/04/2012  . Melanoma in situ [facility]) 08/2016   Derm= Dr. Renda Rolls [facility] in [place].  . Osteoarthritis of both knees    R>L  . Osteopenia 11/23/2010; 02/2016   2017 T-score -1.9.  Repeat DEXA 2 yrs.  . Peripheral neuropathy 11/06/2010   Small fiber, non-diabetic per neurologist  . Right lumbar radiculopathy 01/2015   Improved with PT  . SCC (squamous cell carcinoma), arm 11/06/2010  . Seborrheic keratosis 04/30/2011  . Vertigo    Past Surgical History:  Procedure Laterality Date  . CARDIAC CATHETERIZATION  12/20/2009   3 bare metal stents  . DEXA  03/05/2016   T-score -1.9  . EYE SURGERY     cataract- left eye 09-24-11  . SKIN BIOPSY  08/2012   Shave  excision of lesion on pt's back: irritated seb keratosis.  . TRANSTHORACIC ECHOCARDIOGRAM  12/05/2009   Mild LVH, normal systolic fxn, mild impaired relaxation.  No signif valvular dz.  . TUBAL LIGATION     Family History  Problem Relation Age of Onset  . Stroke Mother   . Heart disease Father   . Hypertension Daughter   . Hyperlipidemia Daughter   . Hypertension Son   . Cancer Daughter        rectal/ chemo and radiation  . Hypertension Daughter   . Hypertension Son   . Hyperlipidemia Son   . Hypertension Son   . Heart disease Brother   . Vision loss Maternal Grandfather    . Stroke Paternal Grandmother   . Asthma Paternal Grandfather    Social History   Socioeconomic History  . Marital status: Single    Spouse name: Not on file  . Number of children: Not on file  . Years of education: Not on file  . Highest education level: Not on file  Occupational History  . Not on file  Social Needs  . Financial resource strain: Not on file  . Food insecurity    Worry: Not on file    Inability: Not on file  . Transportation needs    Medical: Not on file    Non-medical: Not on file  Tobacco Use  . Smoking status: Former Smoker    Quit date: 09/04/1977    Years since quitting: 41.3  . Smokeless tobacco: Never Used  Substance and Sexual Activity  . Alcohol use: Yes    Comment: only on Holidays  . Drug use: No  . Sexual activity: Not on file  Lifestyle  . Physical activity    Days per week: Not on file    Minutes per session: Not on file  . Stress: Not on file  Relationships  . Social Herbalist on phone: Not on file    Gets together: Not on file    Attends religious service: Not on file    Active member of club or organization: Not on file    Attends meetings of clubs or organizations: Not on file    Relationship status: Not on file  Other Topics Concern  . Not on file  Social History Narrative   Lives alone near [place].  Two daughters in [place], [place].   Widowed about 30 yrs.   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   Former smoker: approx 30 pack-yr hx, quit 1979.   Occasional social alcohol.   Exercises 3 days a week at [place].   Takes knitting classes and computer classes.    Outpatient Encounter Medications as of 01/08/2019  Medication Sig  . ALPRAZolam (XANAX) 0.25 MG tablet Take 1 tablet (0.25 mg total) by mouth daily as needed. Panic attack  . amLODipine (NORVASC) 2.5 MG tablet Take 1 tablet (2.5 mg total) by mouth daily.  Marland Kitchen aspirin 81 MG tablet Take 81 mg by mouth daily.   Marland Kitchen azelastine (OPTIVAR) 0.05 % ophthalmic solution INSTILL TWO DROPS INTO EACH EYE TWICE DAILY  . Benfotiamine 150 MG CAPS Take 1 capsule by mouth daily. For numbness in feet  . Cyanocobalamin (VITAMIN B-12 CR PO) Take 1 tablet by mouth once a week.   . enalapril (VASOTEC) 20 MG tablet TAKE 1 TABLET BY MOUTH IN THE MORNING AND 1/2 (ONE-HALF) AT BEDTIME  . fluocinonide (LIDEX) 0.05 % external solution Apply  1 application topically daily as needed.  Marland Kitchen ketoconazole (NIZORAL) 2 % shampoo   . magnesium oxide (MAG-OX) 400 MG tablet Take 400 mg by mouth once a week.   . metoprolol succinate (TOPROL-XL) 25 MG 24 hr tablet Take 1 tablet by mouth once daily  . Multiple Vitamin (MULTIVITAMIN) tablet Take 1 tablet by mouth once a week. Takes occ  . Omega-3 Fatty Acids (FISH OIL PO) Take 1 capsule by mouth once a week.   . Naftifine HCl (NAFTIN) 2 % GEL Apply to affected area qd-bid (Patient not taking: Reported on 01/08/2019)   No facility-administered encounter medications on file as of 01/08/2019.     Activities of Daily Living In your present state of health, do you have any difficulty performing the following activities: 01/08/2019  Hearing? N  Vision? N  Difficulty concentrating or making decisions? N  Walking or climbing stairs? N  Dressing or bathing? N  Doing errands, shopping? N  Preparing Food and eating ? N  Using the Toilet? N  In the past six months, have you accidently leaked urine? N  Do you have problems with loss of bowel control? N  Managing your Medications? N  Managing your Finances? N  Housekeeping or managing your Housekeeping? N  Some recent data might be hidden    Patient Care Team: Tammi Sou, MD as PCP - General (Family Medicine) Skeet Latch, MD as Consulting Physician (Cardiology) [place], Jennefer Bravo, MD as Referring Physician (Dermatology) Garrel Ridgel, [place] as Consulting Physician (Podiatry) Elouise Munroe, MD as Consulting Physician  (Cardiology)    Assessment:   This is a routine wellness examination for [facility].  Exercise Activities and Dietary recommendations Current Exercise Habits: Home exercise routine, Type of exercise: strength training/weights;calisthenics, Time (Minutes): 60, Frequency (Times/Week): 3, Weekly Exercise (Minutes/Week): 180, Exercise limited by: None identified   Diet (meal preparation, eat out, water intake, caffeinated beverages, dairy products, fruits and vegetables): Drinks coffee, Coke (small amt) and water.   Eats heart healthy diet.       Goals      Patient Stated   . Maintain (pt-stated)     Patient would like to maintain current health status, by continuing to be active and eat healthy.       Other   . Patient Stated     Maintain current health by staying active and eating well.     . Patient Stated     Maintain current health.       Fall Risk Fall Risk  01/08/2019 05/06/2017 04/30/2016 02/08/2016 09/28/2014  Falls in the past year? 0 No No No Yes  Number falls in past yr: 0 - - - 1  Injury with Fall? 0 - - - Yes  Follow up Falls prevention discussed - - - -    Depression Screen PHQ 2/9 Scores 01/08/2019 12/15/2018 05/06/2017 04/30/2016  PHQ - 2 Score 0 0 0 0     Cognitive Function MMSE - Mini Mental State Exam 04/30/2016  Orientation to time 5  Orientation to Place 5  Registration 3  Attention/ Calculation 5  Recall 3  Language- name 2 objects 2  Language- repeat 1  Language- follow 3 step command 3  Language- read & follow direction 1  Write a sentence 1  Copy design 1  Total score 30    Ad8 score reviewed for issues:  Issues making decisions: no  Less interest in hobbies / activities: no  Repeats questions, stories (family complaining): no  Trouble using ordinary gadgets (microwave, computer, phone): no  Forgets the month or year: no  Mismanaging finances: no  Remembering appts: no  Daily problems with thinking and/or memory:no Ad8 score is=0          Immunization History  Administered Date(s) Administered  . Fluad Quad(high Dose 65+) 12/15/2018  . Influenza Split 12/22/2010, 12/21/2011  . Influenza Whole 02/15/2010  . Influenza, High Dose Seasonal PF 02/01/2015, 01/02/2017, 01/28/2018  . Influenza,inj,Quad PF,6+ Mos 12/02/2012, 01/21/2014  . Influenza-Unspecified 01/18/2016  . Pneumococcal Conjugate-13 02/10/2014  . Pneumococcal Polysaccharide-23 12/17/2012  . Td 02/15/2010  . Tdap 03/19/2010  . Zoster 03/20/2007  . Zoster Recombinat (Shingrix) 09/10/2017, 11/15/2017, 11/21/2017      Screening Tests Health Maintenance  Topic Date Due  . TETANUS/TDAP  03/19/2020  . INFLUENZA VACCINE  Completed  . DEXA SCAN  Completed  . PNA vac Low Risk Adult  Completed        Plan:    Continue doing brain stimulating activities (puzzles, reading, adult coloring books, staying active) to keep memory sharp.   Bring a copy of your living will and/or healthcare power of attorney to your next office visit.  I have personally reviewed and noted the following in the patient's chart:   . Medical and social history . Use of alcohol, tobacco or illicit drugs  . Current medications and supplements . Functional ability and status . Nutritional status . Physical activity . Advanced directives . List of other physicians . Hospitalizations, surgeries, and ER visits in previous 12 months . Vitals . Screenings to include cognitive, depression, and falls . Referrals and appointments  In addition, I have reviewed and discussed with patient certain preventive protocols, quality metrics, and best practice recommendations. A written personalized care plan for preventive services as well as general preventive health recommendations were provided to patient.     Gerilyn Nestle, RN  01/08/2019   No f/u with PCP at current time.

## 2019-01-08 ENCOUNTER — Other Ambulatory Visit: Payer: Self-pay

## 2019-01-08 ENCOUNTER — Ambulatory Visit (INDEPENDENT_AMBULATORY_CARE_PROVIDER_SITE_OTHER): Payer: Medicare Other

## 2019-01-08 VITALS — BP 116/76 | HR 76 | Wt 130.0 lb

## 2019-01-08 DIAGNOSIS — F419 Anxiety disorder, unspecified: Secondary | ICD-10-CM

## 2019-01-08 DIAGNOSIS — Z0000 Encounter for general adult medical examination without abnormal findings: Secondary | ICD-10-CM

## 2019-01-08 MED ORDER — ALPRAZOLAM 0.25 MG PO TABS: 0.2500 mg | ORAL_TABLET | Freq: Every day | ORAL | 0 refills | Status: DC | PRN

## 2019-01-08 NOTE — Telephone Encounter (Signed)
Patient request refill for Alprazolam 0.25mg  tablet.  Last refill: 01/28/2018 #15, No RF Last OV: 12/15/2018 Next OV: Not scheduled, due 05/2019.  Please advise.

## 2019-01-08 NOTE — Patient Instructions (Addendum)
Continue doing brain stimulating activities (puzzles, reading, adult coloring books, staying active) to keep memory sharp.   Bring a copy of your living will and/or healthcare power of attorney to your next office visit.   Fall Prevention in the Home, Adult Falls can cause injuries. They can happen to people of all ages. There are many things you can do to make your home safe and to help prevent falls. Ask for help when making these changes, if needed. What actions can I take to prevent falls? General Instructions  Use good lighting in all rooms. Replace any light bulbs that burn out.  Turn on the lights when you go into a dark area. Use night-lights.  Keep items that you use often in easy-to-reach places. Lower the shelves around your home if necessary.  Set up your furniture so you have a clear path. Avoid moving your furniture around.  Do not have throw rugs and other things on the floor that can make you trip.  Avoid walking on wet floors.  If any of your floors are uneven, fix them.  Add color or contrast paint or tape to clearly mark and help you see: ? Any grab bars or handrails. ? First and last steps of stairways. ? Where the edge of each step is.  If you use a stepladder: ? Make sure that it is fully opened. Do not climb a closed stepladder. ? Make sure that both sides of the stepladder are locked into place. ? Ask someone to hold the stepladder for you while you use it.  If there are any pets around you, be aware of where they are. What can I do in the bathroom?      Keep the floor dry. Clean up any water that spills onto the floor as soon as it happens.  Remove soap buildup in the tub or shower regularly.  Use non-skid mats or decals on the floor of the tub or shower.  Attach bath mats securely with double-sided, non-slip rug tape.  If you need to sit down in the shower, use a plastic, non-slip stool.  Install grab bars by the toilet and in the tub and  shower. Do not use towel bars as grab bars. What can I do in the bedroom?  Make sure that you have a light by your bed that is easy to reach.  Do not use any sheets or blankets that are too big for your bed. They should not hang down onto the floor.  Have a firm chair that has side arms. You can use this for support while you get dressed. What can I do in the kitchen?  Clean up any spills right away.  If you need to reach something above you, use a strong step stool that has a grab bar.  Keep electrical cords out of the way.  Do not use floor polish or wax that makes floors slippery. If you must use wax, use non-skid floor wax. What can I do with my stairs?  Do not leave any items on the stairs.  Make sure that you have a light switch at the top of the stairs and the bottom of the stairs. If you do not have them, ask someone to add them for you.  Make sure that there are handrails on both sides of the stairs, and use them. Fix handrails that are broken or loose. Make sure that handrails are as long as the stairways.  Install non-slip stair treads on all  stairs in your home.  Avoid having throw rugs at the top or bottom of the stairs. If you do have throw rugs, attach them to the floor with carpet tape.  Choose a carpet that does not hide the edge of the steps on the stairway.  Check any carpeting to make sure that it is firmly attached to the stairs. Fix any carpet that is loose or worn. What can I do on the outside of my home?  Use bright outdoor lighting.  Regularly fix the edges of walkways and driveways and fix any cracks.  Remove anything that might make you trip as you walk through a door, such as a raised step or threshold.  Trim any bushes or trees on the path to your home.  Regularly check to see if handrails are loose or broken. Make sure that both sides of any steps have handrails.  Install guardrails along the edges of any raised decks and porches.  Clear  walking paths of anything that might make someone trip, such as tools or rocks.  Have any leaves, snow, or ice cleared regularly.  Use sand or salt on walking paths during winter.  Clean up any spills in your garage right away. This includes grease or oil spills. What other actions can I take?  Wear shoes that: ? Have a low heel. Do not wear high heels. ? Have rubber bottoms. ? Are comfortable and fit you well. ? Are closed at the toe. Do not wear open-toe sandals.  Use tools that help you move around (mobility aids) if they are needed. These include: ? Canes. ? Walkers. ? Scooters. ? Crutches.  Review your medicines with your doctor. Some medicines can make you feel dizzy. This can increase your chance of falling. Ask your doctor what other things you can do to help prevent falls. Where to find more information  Centers for Disease Control and Prevention, STEADI: https://garcia.biz/  [employer] on Aging: BrainJudge.co.uk Contact a doctor if:  You are afraid of falling at home.  You feel weak, drowsy, or dizzy at home.  You fall at home. Summary  There are many simple things that you can do to make your home safe and to help prevent falls.  Ways to make your home safe include removing tripping hazards and installing grab bars in the bathroom.  Ask for help when making these changes in your home. This information is not intended to replace advice given to you by your health care provider. Make sure you discuss any questions you have with your health care provider. Document Released: 12/30/2008 Document Revised: 06/26/2018 Document Reviewed: 10/18/2016 Elsevier Patient Education  2020 [place] Maintenance, Female Adopting a healthy lifestyle and getting preventive care are important in promoting health and wellness. Ask your health care provider about:  The right schedule for you to have regular tests and exams.  Things you can do on  your own to prevent diseases and keep yourself healthy. What should I know about diet, weight, and exercise? Eat a healthy diet   Eat a diet that includes plenty of vegetables, fruits, low-fat dairy products, and lean protein.  Do not eat a lot of foods that are high in solid fats, added sugars, or sodium. Maintain a healthy weight Body mass index (BMI) is used to identify weight problems. It estimates body fat based on height and weight. Your health care provider can help determine your BMI and help you achieve or maintain a healthy weight.  Get regular exercise Get regular exercise. This is one of the most important things you can do for your health. Most adults should:  Exercise for at least 150 minutes each week. The exercise should increase your heart rate and make you sweat (moderate-intensity exercise).  Do strengthening exercises at least twice a week. This is in addition to the moderate-intensity exercise.  Spend less time sitting. Even light physical activity can be beneficial. Watch cholesterol and blood lipids Have your blood tested for lipids and cholesterol at 83 years of age, then have this test every 5 years. Have your cholesterol levels checked more often if:  Your lipid or cholesterol levels are high.  You are older than 83 years of age.  You are at high risk for heart disease. What should I know about cancer screening? Depending on your health history and family history, you may need to have cancer screening at various ages. This may include screening for:  Breast cancer.  Cervical cancer.  Colorectal cancer.  Skin cancer.  Lung cancer. What should I know about heart disease, diabetes, and high blood pressure? Blood pressure and heart disease  High blood pressure causes heart disease and increases the risk of stroke. This is more likely to develop in people who have high blood pressure readings, are of African descent, or are overweight.  Have your blood  pressure checked: ? Every 3-5 years if you are 59-46 years of age. ? Every year if you are 1 years old or older. Diabetes Have regular diabetes screenings. This checks your fasting blood sugar level. Have the screening done:  Once every three years after age 69 if you are at a normal weight and have a low risk for diabetes.  More often and at a younger age if you are overweight or have a high risk for diabetes. What should I know about preventing infection? Hepatitis B If you have a higher risk for hepatitis B, you should be screened for this virus. Talk with your health care provider to find out if you are at risk for hepatitis B infection. Hepatitis C Testing is recommended for:  Everyone born from [birth year] through 1963.  Anyone with known risk factors for hepatitis C. Sexually transmitted infections (STIs)  Get screened for STIs, including gonorrhea and chlamydia, if: ? You are sexually active and are younger than 83 years of age. ? You are older than 83 years of age and your health care provider tells you that you are at risk for this type of infection. ? Your sexual activity has changed since you were last screened, and you are at increased risk for chlamydia or gonorrhea. Ask your health care provider if you are at risk.  Ask your health care provider about whether you are at high risk for HIV. Your health care provider may recommend a prescription medicine to help prevent HIV infection. If you choose to take medicine to prevent HIV, you should first get tested for HIV. You should then be tested every 3 months for as long as you are taking the medicine. Pregnancy  If you are about to stop having your period (premenopausal) and you may become pregnant, seek counseling before you get pregnant.  Take 400 to 800 micrograms (mcg) of folic acid every day if you become pregnant.  Ask for birth control (contraception) if you want to prevent pregnancy. Osteoporosis and menopause  Osteoporosis is a disease in which the bones lose minerals and strength with aging. This can result in bone  fractures. If you are 52 years old or older, or if you are at risk for osteoporosis and fractures, ask your health care provider if you should:  Be screened for bone loss.  Take a calcium or vitamin D supplement to lower your risk of fractures.  Be given hormone replacement therapy (HRT) to treat symptoms of menopause. Follow these instructions at home: Lifestyle  Do not use any products that contain nicotine or tobacco, such as cigarettes, e-cigarettes, and chewing tobacco. If you need help quitting, ask your health care provider.  Do not use street drugs.  Do not share needles.  Ask your health care provider for help if you need support or information about quitting drugs. Alcohol use  Do not drink alcohol if: ? Your health care provider tells you not to drink. ? You are pregnant, may be pregnant, or are planning to become pregnant.  If you drink alcohol: ? Limit how much you use to 0-1 drink a day. ? Limit intake if you are breastfeeding.  Be aware of how much alcohol is in your drink. In the [place] one drink equals one 12 oz bottle of beer (355 mL), one 5 oz glass of wine (148 mL), or one 1 oz glass of hard liquor (44 mL). General instructions  Schedule regular health, dental, and eye exams.  Stay current with your vaccines.  Tell your health care provider if: ? You often feel depressed. ? You have ever been abused or do not feel safe at home. Summary  Adopting a healthy lifestyle and getting preventive care are important in promoting health and wellness.  Follow your health care provider's instructions about healthy diet, exercising, and getting tested or screened for diseases.  Follow your health care provider's instructions on monitoring your cholesterol and blood pressure. This information is not intended to replace advice given to you by your health care  provider. Make sure you discuss any questions you have with your health care provider. Document Released: 09/18/2010 Document Revised: 02/26/2018 Document Reviewed: 02/26/2018 Elsevier Patient Education  2020 [employer].

## 2019-01-11 NOTE — Progress Notes (Signed)
AWV reviewed and agree. Signed:  Crissie Sickles, MD           01/11/2019

## 2019-01-23 NOTE — Progress Notes (Signed)
This encounter was created in error - please disregard.

## 2019-03-04 NOTE — Progress Notes (Signed)
Virtual Visit via Telephone Note   This visit type was conducted due to national recommendations for restrictions regarding the COVID-19 Pandemic (e.g. social distancing) in an effort to limit this patient's exposure and mitigate transmission in our community.  Due to her co-morbid illnesses, this patient is at least at moderate risk for complications without adequate follow up.  This format is felt to be most appropriate for this patient at this time.  The patient did not have access to video technology/had technical difficulties with video requiring transitioning to audio format only (telephone).  All issues noted in this document were discussed and addressed.  No physical exam could be performed with this format.  Please refer to the patient's chart for her  consent to telehealth for [facility].  Evaluation Performed:  Follow-up visit  This visit type was conducted due to national recommendations for restrictions regarding the COVID-19 Pandemic (e.g. social distancing).  This format is felt to be most appropriate for this patient at this time.  All issues noted in this document were discussed and addressed.  No physical exam was performed (except for noted visual exam findings with Video Visits).  Please refer to the patient's chart (MyChart message for video visits and phone note for telephone visits) for the patient's consent to telehealth for [facility]  Date:  03/04/2019   ID:  Jennifer Peck, DOB [birth year], MRN [id number]  Patient Location: [place] [address]   Provider location:      [address] Office [phone] Fax [phone]   PCP:  Tammi Sou, MD  Cardiologist:  Skeet Latch, MD  Electrophysiologist:  None   Chief Complaint: Follow-up  History of Present Illness:    Jennifer Peck is a 85 y.o. female who presents via audio/video conferencing for a telehealth visit  today.  Patient verified DOB and address.  Jennifer Peck 85 year old female with a PMH of CAD status post PCI, HTN, and hyperlipidemia.  Previous patient of [name] and current patient of Dr. Oval Linsey.  She had an abnormal stress test in 2011.  She underwent PCI with BMS placed in her left circumflex, RCA, OM1 and OM 2  in 2011.  She is statin intolerant and Zetia intolerant due to myalgias.  She is currently managing her lipids with diet and exercise and has not been interested in trying PCSK9 inhibitor.  She indicated that she was exercising 3 times per week prior to the COVID-19 pandemic.  However, she has not been able to since quarantining.  She indicates that since winter/Christmas 2019 she has had discomfort in bilateral lower lobes down to her shoulders.  She underwent evaluation by her dentist and no concerning findings were apparent.  She notices this discomfort when she turns her head in either direction and she states that it makes her neck feels stiff.  She stated that she felt could be arthritis due to the fact that she has arthritis in her feet as well.  She was offered a stress test  but did not wish to undergo testing at that time.  She presents to the clinic today and states she has increased her physical activity.  Her gym has given her a flash drive with strengthening exercises which she does for around an hour Monday Wednesday and Friday.  She walks for 30 minutes daily and also does a 30-minute stretching routine each day.  She has been eating a  heart healthy low-sodium high-fiber diet.  She states that since increasing her physical activity she no longer has neck pain unless she reads for an extended period of time.  She states she has been quarantining for the last 10 months and is very cautious.  She is willing to have an office visit in 6 months with Dr. Oval Linsey for a follow-up EKG and lipid panel.  Shr denies chest pain, shortness of breath, lower extremity edema, fatigue,  palpitations, melena, hematuria, hemoptysis, diaphoresis, weakness, presyncope, syncope, orthopnea, and PND.   The patient does not symptoms concerning for COVID-19 infection (fever, chills, cough, or new SHORTNESS OF BREATH).    Prior CV studies:   The following studies were reviewed today:  EKG 11/13/2017 Sinus rhythm nonspecific ST abnormality 78 bpm  Echocardiogram 12/05/2009 Estimated EF 55 to 60%  Past Medical History:  Diagnosis Date  . Allergic state 04/30/2011  . Anxiety   . BCC (basal cell carcinoma), face 11/06/2010  . Bilateral bunions 11/06/2010  . Chronic venous insufficiency    lymphedema + venous stasis pigmentation changes  . Constipation 11/06/2010  . Coronary artery disease 2011   BMS x 3  . GERD (gastroesophageal reflux disease) 05/11/2012  . History of hyperkalemia 01/23/2011  . Hyperlipidemia    Intolerant of statins and zetia.  Refused to consider PCSK9 inhibitor initially but as of 02/2017 cardiol f/u she would consider repatha if approved.  . Hypertension    +white coat component  . Lesion of labia 02/04/2012  . Melanoma in situ [facility]) 08/2016   Derm= Dr. Renda Rolls [facility] [place].  . Osteoarthritis of both knees    R>L  . Osteopenia 11/23/2010; 02/2016   2017 T-score -1.9.  Repeat DEXA 2 yrs.  . Peripheral neuropathy 11/06/2010   Small fiber, non-diabetic per neurologist  . Right lumbar radiculopathy 01/2015   Improved with PT  . SCC (squamous cell carcinoma), arm 11/06/2010  . Seborrheic keratosis 04/30/2011  . Vertigo    Past Surgical History:  Procedure Laterality Date  . CARDIAC CATHETERIZATION  12/20/2009   3 bare metal stents  . DEXA  03/05/2016   T-score -1.9  . EYE SURGERY     cataract- left eye 09-24-11  . SKIN BIOPSY  08/2012   Shave excision of lesion on pt's back: irritated seb keratosis.  . TRANSTHORACIC ECHOCARDIOGRAM  12/05/2009   Mild LVH, normal systolic fxn, mild impaired relaxation.  No signif valvular dz.  .  TUBAL LIGATION       No outpatient medications have been marked as taking for the 03/05/19 encounter (Appointment) with Deberah Pelton, NP.     Allergies:   Chlorthalidone, Atenolol, Besivance [besifloxacin hcl], Codeine, Crestor [rosuvastatin], Gabapentin, Lipitor [atorvastatin], Lovastatin, Simvastatin, Sulfa antibiotics, Vigamox [moxifloxacin], and Welchol [colesevelam hcl]   Social History   Tobacco Use  . Smoking status: Former Smoker    Quit date: 09/04/1977    Years since quitting: 41.5  . Smokeless tobacco: Never Used  Substance Use Topics  . Alcohol use: Yes    Comment: only on Holidays  . Drug use: No     Family Hx: The patient's family history includes Asthma in her paternal grandfather; Cancer in her daughter; Heart disease in her brother and father; Hyperlipidemia in her daughter and son; Hypertension in her daughter, daughter, son, son, and son; Stroke in her mother and paternal grandmother; Vision loss in her maternal grandfather.  ROS:   Please see the history of present illness.  All other systems reviewed and are negative.   Labs/Other Tests and Data Reviewed:    Recent Labs: 12/15/2018: ALT 12; BUN 13; Creatinine, Ser 0.75; Potassium 4.4; Sodium 137   Recent Lipid Panel Lab Results  Component Value Date/Time   CHOL 232 (H) 01/30/2017 09:04 AM   TRIG 98.0 01/30/2017 09:04 AM   HDL 68.80 01/30/2017 09:04 AM   CHOLHDL 3 01/30/2017 09:04 AM   LDLCALC 144 (H) 01/30/2017 09:04 AM   LDLDIRECT 128.0 12/02/2012 08:37 AM    Wt Readings from Last 3 Encounters:  01/08/19 130 lb (59 kg)  12/19/18 134 lb (60.8 kg)  12/15/18 134 lb 5 oz (60.9 kg)     Exam:    Vital Signs:  There were no vitals taken for this visit.   Well nourished, well developed female in no  acute distress.   ASSESSMENT & PLAN:    1.  Coronary artery disease-no chest pain today.  Bilateral neck pain has resolved with increased physical activity. Continue aspirin 81 mg tablet  daily Continue amlodipine 2.5 mg daily Continue enalapril 20 mg a.m. and 10 mg p.m. Continue metoprolol succinate 25 mg tablet daily Heart healthy low-sodium diet Maintain physical activity  Essential hypertension-BP today 138/76. Well-controlled at home Continue amlodipine 2.5 mg daily Continue enalapril  Continue metoprolol succinate 25 mg tablet daily Heart healthy low-sodium diet-salty 6 given Increase physical activity as tolerated  Hyperlipidemia-LDL 144 01/30/2017 intolerance to statins and Zetia Maintain fiber in diet Increase physical activity Repeat lipid panel with follow-up visit  COVID-19 Education: The signs and symptoms of COVID-19 were discussed with the patient and how to seek care for testing (follow up with PCP or arrange E-visit).  The importance of social distancing was discussed today.  Patient Risk:   After full review of this patients clinical status, I feel that they are at least moderate risk at this time.  Time:   Today, I have spent 17 minutes with the patient with telehealth technology discussing diet, exercise, medications, cholesterol, blood pressure, and COVID-19.     Medication Adjustments/Labs and Tests Ordered: Current medicines are reviewed at length with the patient today.  Concerns regarding medicines are outlined above.   Tests Ordered: No orders of the defined types were placed in this encounter.  Medication Changes: No orders of the defined types were placed in this encounter.   Disposition:  in 6 month(s) follow-up with Dr. Oval Linsey   Signed,  Jossie Ng. [facility] [address] Office [phone] Fax [phone]

## 2019-03-05 ENCOUNTER — Ambulatory Visit: Payer: Medicare Other | Admitting: Cardiovascular Disease

## 2019-03-05 ENCOUNTER — Telehealth: Payer: Self-pay

## 2019-03-05 ENCOUNTER — Telehealth (INDEPENDENT_AMBULATORY_CARE_PROVIDER_SITE_OTHER): Payer: Medicare Other | Admitting: General Practice

## 2019-03-05 ENCOUNTER — Encounter: Payer: Self-pay | Admitting: General Practice

## 2019-03-05 DIAGNOSIS — F419 Anxiety disorder, unspecified: Secondary | ICD-10-CM | POA: Diagnosis not present

## 2019-03-05 MED ORDER — AMLODIPINE BESYLATE 2.5 MG PO TABS: 2.5000 mg | ORAL_TABLET | Freq: Every day | ORAL | 1 refills | Status: DC

## 2019-03-05 MED ORDER — METOPROLOL SUCCINATE ER 25 MG PO TB24: 25.0000 mg | ORAL_TABLET | Freq: Every day | ORAL | 1 refills | Status: DC

## 2019-03-05 NOTE — Telephone Encounter (Signed)
Pt called back and is unable to get a ride to the office today so we will keep the virtual appt and see how this goes

## 2019-03-05 NOTE — Addendum Note (Signed)
Addended by: Waylan Rocher on: 03/05/2019 12:54 PM   Modules accepted: Orders

## 2019-03-05 NOTE — Telephone Encounter (Signed)
CALLED PT, NEED TO HAVE PT COME INTO THE OFFICE FOR APPT WITH HER SX THAT SHE IS HAVING IT WOULD BE BETTER IF SHE HAS AN EKG TODAY

## 2019-03-05 NOTE — Patient Instructions (Signed)
Follow-Up: IN 6 months Please call our office 2 months in advance, APR 2021 to schedule this JUN 2021 appointment. In Person You may see Skeet Latch, MD or one of the following Advanced Practice Providers on your designated Care Team:  Coletta Memos, [place], PA-C [place], [place].    If you need a refill on your cardiac medications before your next appointment, please call your pharmacy.  Special Instructions: CONTINUE INCREASED FIBER DIET   CONTINUE TO EXERCISE GOAL IS 30 MINUES/DAY FOR 5 DAYS A WEEK  Reduce your risk of getting COVID-19 With your heart disease it is especially important for people at increased risk of severe illness from COVID-19, and those who live with them, to protect themselves from getting COVID-19. The best way to protect yourself and to help reduce the spread of the virus that causes COVID-19 is to: Marland Kitchen Limit your interactions with other people as much as possible. . Take precautions to prevent getting COVID-19 when you do interact with others. If you start feeling sick and think you may have COVID-19, get in touch with your healthcare provider within 24  At [facility], you and your health needs are our priority.  As part of our continuing mission to provide you with exceptional heart care, we have created designated Provider Care Teams.  These Care Teams include your primary Cardiologist (physician) and Advanced Practice Providers (APPs -  Physician Assistants and Nurse Practitioners) who all work together to provide you with the care you need, when you need it.  Thank you for choosing [facility] at [facility]!!        Happy Holidays!!

## 2019-03-22 ENCOUNTER — Other Ambulatory Visit: Payer: Self-pay | Admitting: Family Medicine

## 2019-03-30 ENCOUNTER — Ambulatory Visit: Payer: BC Managed Care – PPO | Admitting: Podiatry

## 2019-05-19 ENCOUNTER — Other Ambulatory Visit: Payer: Self-pay

## 2019-05-19 ENCOUNTER — Ambulatory Visit (INDEPENDENT_AMBULATORY_CARE_PROVIDER_SITE_OTHER): Payer: Medicare Other | Admitting: Podiatry

## 2019-05-19 ENCOUNTER — Encounter: Payer: Self-pay | Admitting: Podiatry

## 2019-05-19 VITALS — Temp 96.8°F

## 2019-05-19 DIAGNOSIS — G629 Polyneuropathy, unspecified: Secondary | ICD-10-CM

## 2019-05-19 DIAGNOSIS — L84 Corns and callosities: Secondary | ICD-10-CM | POA: Diagnosis not present

## 2019-05-19 DIAGNOSIS — M79675 Pain in left toe(s): Secondary | ICD-10-CM | POA: Diagnosis not present

## 2019-05-19 DIAGNOSIS — B351 Tinea unguium: Secondary | ICD-10-CM

## 2019-05-19 DIAGNOSIS — M79674 Pain in right toe(s): Secondary | ICD-10-CM | POA: Diagnosis not present

## 2019-05-19 NOTE — Patient Instructions (Signed)

## 2019-05-24 NOTE — Progress Notes (Signed)
Subjective: Jennifer Peck presents today for follow up of follow up h/o neuropathy and corn(s) b/l feet and painful mycotic toenails b/l that are difficult to trim. Pain interferes with ambulation. Aggravating factors include wearing enclosed shoe gear. Pain is relieved with periodic professional debridement..   Allergies  Allergen Reactions  . Chlorthalidone     hyponatremia  . Atenolol     Caused fatigue   . Besivance [Besifloxacin Hcl]     rash  . Codeine Nausea And Vomiting  . Crestor [Rosuvastatin]     "leg gave away"  . Gabapentin     Memory loss and confusion  . Lipitor [Atorvastatin] Other (See Comments)    Severe leg pain  . Lovastatin     myalgia  . Simvastatin     Her leg gave away on her and states thinks secondary to Simvastatin  . Sulfa Antibiotics Nausea Only  . Vigamox [Moxifloxacin]     rash  . Welchol [Colesevelam Hcl]      Objective: Vitals:   05/19/19 1411  Temp: (!) 96.8 F (36 C)    Pt 86 y.o. y.o. Patient Race: White or Caucasian [1]  female  in NAD. AAO x 3.   Vascular Examination:  Capillary refill time to digits immediate b/l. Palpable DP pulses b/l. Palpable PT pulses b/l. Pedal hair sparse b/l. Skin temperature gradient within normal limits b/l.  Dermatological Examination: Pedal skin with normal turgor, texture and tone bilaterally. No open wounds bilaterally. No interdigital macerations bilaterally. Toenails 1-5 b/l elongated, dystrophic, thickened, crumbly with subungual debris and tenderness to dorsal palpation. Hyperkeratotic lesion(s) lateral aspect left 2nd digit, medial aspect right 3rd digit and distal aspect of right 3rd digit.  No erythema, no edema, no drainage, no flocculence.  Musculoskeletal: Normal muscle strength 5/5 to all lower extremity muscle groups bilaterally, no pain crepitus or joint limitation noted with ROM b/l and hammertoes noted to the  L 2nd toe, L 3rd toe, L 4th toe, R 2nd toe, R 3rd toe and R 4th  toe  Neurological: Protective sensation diminished with 10g monofilament b/l. Vibratory sensation absent b/l  Assessment: No diagnosis found.  Plan: -Toenails 1-5 b/l were debrided in length and girth with sterile nail nippers and dremel without iatrogenic bleeding.  -Corn(s) debrided left 2nd and b/l 3rd digits without complication or incident. Total number debrided=3. -Patient to continue soft, supportive shoe gear daily. -Patient to report any pedal injuries to medical professional immediately. -Patient/POA to call should there be question/concern in the interim.  Return in about 3 months (around 08/19/2019) for nail and callus trim.

## 2019-06-17 ENCOUNTER — Other Ambulatory Visit: Payer: Self-pay | Admitting: Family Medicine

## 2019-08-19 ENCOUNTER — Encounter: Payer: Self-pay | Admitting: Podiatry

## 2019-08-19 ENCOUNTER — Ambulatory Visit (INDEPENDENT_AMBULATORY_CARE_PROVIDER_SITE_OTHER): Payer: Medicare Other | Admitting: Podiatry

## 2019-08-19 ENCOUNTER — Other Ambulatory Visit: Payer: Self-pay

## 2019-08-19 DIAGNOSIS — G629 Polyneuropathy, unspecified: Secondary | ICD-10-CM

## 2019-08-19 DIAGNOSIS — M79674 Pain in right toe(s): Secondary | ICD-10-CM

## 2019-08-19 DIAGNOSIS — M2011 Hallux valgus (acquired), right foot: Secondary | ICD-10-CM

## 2019-08-19 DIAGNOSIS — B351 Tinea unguium: Secondary | ICD-10-CM

## 2019-08-19 DIAGNOSIS — L84 Corns and callosities: Secondary | ICD-10-CM | POA: Diagnosis not present

## 2019-08-19 DIAGNOSIS — M79675 Pain in left toe(s): Secondary | ICD-10-CM

## 2019-08-19 DIAGNOSIS — M2012 Hallux valgus (acquired), left foot: Secondary | ICD-10-CM

## 2019-08-19 NOTE — Patient Instructions (Addendum)
Recommend shoes with soft, stretchable uppers.  Examples: -Oofos -Arcopedicos  Apply Neosporin to left 2nd and left 3rd toes once daily for one week.  Corns and Calluses Corns are small areas of thickened skin that occur on the top, sides, or tip of a toe. They contain a cone-shaped core with a point that can press on a nerve below. This causes pain.  Calluses are areas of thickened skin that can occur anywhere on the body, including the hands, fingers, palms, soles of the feet, and heels. Calluses are usually larger than corns. What are the causes? Corns and calluses are caused by rubbing (friction) or pressure, such as from shoes that are too tight or do not fit properly. What increases the risk? Corns are more likely to develop in people who have misshapen toes (toe deformities), such as hammer toes. Calluses can occur with friction to any area of the skin. They are more likely to develop in people who:  Work with their hands.  Wear shoes that fit poorly, are too tight, or are high-heeled.  Have toe deformities. What are the signs or symptoms? Symptoms of a corn or callus include:  A hard growth on the skin.  Pain or tenderness under the skin.  Redness and swelling.  Increased discomfort while wearing tight-fitting shoes, if your feet are affected. If a corn or callus becomes infected, symptoms may include:  Redness and swelling that gets worse.  Pain.  Fluid, blood, or pus draining from the corn or callus. How is this diagnosed? Corns and calluses may be diagnosed based on your symptoms, your medical history, and a physical exam. How is this treated? Treatment for corns and calluses may include:  Removing the cause of the friction or pressure. This may involve: ? Changing your shoes. ? Wearing shoe inserts (orthotics) or other protective layers in your shoes, such as a corn pad. ? Wearing gloves.  Applying medicine to the skin (topical medicine) to help soften skin  in the hardened, thickened areas.  Removing layers of dead skin with a file to reduce the size of the corn or callus.  Removing the corn or callus with a scalpel or laser.  Taking antibiotic medicines, if your corn or callus is infected.  Having surgery, if a toe deformity is the cause. Follow these instructions at home:   Take over-the-counter and prescription medicines only as told by your health care provider.  If you were prescribed an antibiotic, take it as told by your health care provider. Do not stop taking it even if your condition starts to improve.  Wear shoes that fit well. Avoid wearing high-heeled shoes and shoes that are too tight or too loose.  Wear any padding, protective layers, gloves, or orthotics as told by your health care provider.  Soak your hands or feet and then use a file or pumice stone to soften your corn or callus. Do this as told by your health care provider.  Check your corn or callus every day for symptoms of infection. Contact a health care provider if you:  Notice that your symptoms do not improve with treatment.  Have redness or swelling that gets worse.  Notice that your corn or callus becomes painful.  Have fluid, blood, or pus coming from your corn or callus.  Have new symptoms. Summary  Corns are small areas of thickened skin that occur on the top, sides, or tip of a toe.  Calluses are areas of thickened skin that can occur anywhere  on the body, including the hands, fingers, palms, and soles of the feet. Calluses are usually larger than corns.  Corns and calluses are caused by rubbing (friction) or pressure, such as from shoes that are too tight or do not fit properly.  Treatment may include wearing any padding, protective layers, gloves, or orthotics as told by your health care provider. This information is not intended to replace advice given to you by your health care provider. Make sure you discuss any questions you have with your  health care provider. Document Revised: 06/25/2018 Document Reviewed: 01/16/2017 Elsevier Patient Education  2020 [employer].

## 2019-08-24 NOTE — Progress Notes (Signed)
Subjective: Jennifer Peck is a pleasant 86 y.o. female patient seen today at [facility] with history of peripheral neuropathy and painful corn(s) b/l feet and painful mycotic toenails b/l that are difficult to trim. Pain interferes with ambulation. Aggravating factors include wearing enclosed shoe gear. Pain is relieved with periodic professional debridement.   Patient's daughter is present during thei visit on today. Jennifer Peck c/o painful interdigital corns left 2nd/3rd digit.  Patient Active Problem List   Diagnosis Date Noted  . Essential hypertension 11/20/2017  . Anxiety 11/20/2017  . Vertigo 11/20/2017  . Coronary atherosclerosis 10/24/2017  . Mononeuritis 10/24/2017  . Skin tear of lower leg without complication 51/88/4166  . Right knee pain 01/05/2014  . Strain of right knee 12/07/2013  . Osteoarthritis 12/07/2013  . Preventative health care 10/07/2013  . Allergic conjunctivitis 06/03/2013  . Esophageal reflux 05/11/2012  . Lesion of labia 02/04/2012  . Hyperlipidemia 04/30/2011  . Allergic state 04/30/2011  . Seborrheic keratosis 04/30/2011  . Hyperkalemia 01/23/2011  . Hyponatremia 11/23/2010  . Osteopenia 11/23/2010  . BCC (basal cell carcinoma), face 11/06/2010  . SCC (squamous cell carcinoma), arm 11/06/2010  . Bilateral bunions 11/06/2010  . Peripheral neuropathy 11/06/2010  . Constipation 11/06/2010  . Left leg pain 11/06/2010  . PAC (premature atrial contraction) 06/06/2010  . Polypharmacy 06/06/2010  . Pure hypercholesterolemia 06/06/2010  . Ischemic heart disease 06/06/2010    Current Outpatient Medications on File Prior to Visit  Medication Sig Dispense Refill  . ALPRAZolam (XANAX) 0.25 MG tablet Take 1 tablet (0.25 mg total) by mouth daily as needed. Panic attack 15 tablet 0  . amLODipine (NORVASC) 2.5 MG tablet Take 1 tablet (2.5 mg total) by mouth daily. 90 tablet 1  . aspirin 81 MG tablet Take 81 mg by mouth daily.    Marland Kitchen azelastine (OPTIVAR) 0.05  % ophthalmic solution INSTILL TWO DROPS INTO EACH EYE TWICE DAILY 6 mL 12  . Benfotiamine 150 MG CAPS Take 1 capsule by mouth daily. For numbness in feet    . Cyanocobalamin (VITAMIN B-12 CR PO) Take 1 tablet by mouth once a week.     . enalapril (VASOTEC) 20 MG tablet TAKE 1 TABLET BY MOUTH IN THE MORNING AND 1/2 (ONE-HALF) AT BEDTIME 135 tablet 0  . fluocinonide (LIDEX) 0.05 % external solution Apply 1 application topically daily as needed.    Marland Kitchen ketoconazole (NIZORAL) 2 % shampoo     . magnesium oxide (MAG-OX) 400 MG tablet Take 400 mg by mouth once a week.     . metoprolol succinate (TOPROL-XL) 25 MG 24 hr tablet Take 1 tablet (25 mg total) by mouth daily. 90 tablet 1  . Multiple Vitamin (MULTIVITAMIN) tablet Take 1 tablet by mouth once a week. Takes occ    . Naftifine HCl (NAFTIN) 2 % GEL Apply to affected area qd-bid 60 g 3  . Omega-3 Fatty Acids (FISH OIL PO) Take 1 capsule by mouth once a week.      No current facility-administered medications on file prior to visit.    Allergies  Allergen Reactions  . Chlorthalidone     hyponatremia  . Atenolol     Caused fatigue   . Besivance [Besifloxacin Hcl]     rash  . Codeine Nausea And Vomiting  . Crestor [Rosuvastatin]     "leg gave away"  . Gabapentin     Memory loss and confusion  . Lipitor [Atorvastatin] Other (See Comments)    Severe leg pain  .  Lovastatin     myalgia  . Simvastatin     Her leg gave away on her and states thinks secondary to Simvastatin  . Sulfa Antibiotics Nausea Only  . Vigamox [Moxifloxacin]     rash  . Welchol [Colesevelam Hcl]     Objective: Physical Exam  General: Jennifer Peck is a pleasant 86 y.o. Caucasian female, in NAD. AAO x 3.   Vascular:  Neurovascular status unchanged b/l lower extremities. Capillary refill time to digits immediate b/l. Palpable DP pulses b/l. Palpable PT pulses b/l. Pedal hair sparse b/l. Skin temperature gradient within normal limits b/l.  Dermatological:  Pedal  skin with normal turgor, texture and tone bilaterally. No open wounds bilaterally. No interdigital macerations bilaterally. Toenails 1-5 b/l elongated, discolored, dystrophic, thickened, crumbly with subungual debris and tenderness to dorsal palpation. Hyperkeratotic lesion(s) L 2nd toe, L 3rd toe and R 2nd toe.  No erythema, no edema, no drainage, no flocculence.  Musculoskeletal:  Normal muscle strength 5/5 to all lower extremity muscle groups bilaterally. No pain crepitus or joint limitation noted with ROM b/l. Hallux valgus with bunion deformity noted b/l lower extremities. Left bunion is noted to possess blanchable erythema. No warmth, no flocculence. Hammertoes noted to the L 2nd toe, L 3rd toe, L 4th toe, R 2nd toe, R 3rd toe and R 4th toe.  She is wearing [employer] which do not appear to be adequate in width of the forefoot area.  Neurological:  Protective sensation diminished with 10g monofilament b/l.  Assessment and Plan:  1. Pain due to onychomycosis of toenails of both feet   2. Corns   3. Hallux valgus, acquired, bilateral   4. Neuropathy    -Examined patient. -Toenails 1-5 b/l were debrided in length and girth with sterile nail nippers and dremel without iatrogenic bleeding.  -Corn(s) b/l lower extremities pared utilizing sterile scalpel blade without complication or incident. Total number debrided=3. -For bunion left foot, foam bunion shield was dispensed. -Recommended change in shoe gear from her leather shoes to shoes with stretchable uppers such as Oofos or Arcopedicos. -Dispensed toe separators for interdigital corns. -Patient to report any pedal injuries to medical professional immediately. -Patient/POA to call should there be question/concern in the interim.  Return in about 9 weeks (around 10/21/2019) for nail and callus trim.  Marzetta Board, DPM

## 2019-09-29 ENCOUNTER — Ambulatory Visit (INDEPENDENT_AMBULATORY_CARE_PROVIDER_SITE_OTHER): Payer: Medicare Other | Admitting: Cardiovascular Disease

## 2019-09-29 ENCOUNTER — Other Ambulatory Visit: Payer: Self-pay

## 2019-09-29 ENCOUNTER — Encounter: Payer: Self-pay | Admitting: Cardiovascular Disease

## 2019-09-29 VITALS — BP 150/74 | HR 77 | Temp 97.7°F | Ht 60.0 in | Wt 138.4 lb

## 2019-09-29 DIAGNOSIS — I10 Essential (primary) hypertension: Secondary | ICD-10-CM | POA: Diagnosis not present

## 2019-09-29 DIAGNOSIS — E7800 Pure hypercholesterolemia, unspecified: Secondary | ICD-10-CM

## 2019-09-29 MED ORDER — ENALAPRIL MALEATE 20 MG PO TABS: ORAL_TABLET | ORAL | 3 refills | Status: DC

## 2019-09-29 MED ORDER — METOPROLOL SUCCINATE ER 25 MG PO TB24: 25.0000 mg | ORAL_TABLET | Freq: Every day | ORAL | 3 refills | Status: DC

## 2019-09-29 MED ORDER — AMLODIPINE BESYLATE 2.5 MG PO TABS: 2.5000 mg | ORAL_TABLET | Freq: Every day | ORAL | 3 refills | Status: DC

## 2019-09-29 NOTE — Patient Instructions (Signed)
Medication Instructions:  Your physician recommends that you continue on your current medications as directed. Please refer to the Current Medication list given to you today.  *If you need a refill on your cardiac medications before your next appointment, please call your pharmacy*  Lab Work: FASTING LP/CMET SOON   Testing/Procedures: NONE   Follow-Up: At [facility], you and your health needs are our priority.  As part of our continuing mission to provide you with exceptional heart care, we have created designated Provider Care Teams.  These Care Teams include your primary Cardiologist (physician) and Advanced Practice Providers (APPs -  Physician Assistants and Nurse Practitioners) who all work together to provide you with the care you need, when you need it.  We recommend signing up for the patient portal called "MyChart".  Sign up information is provided on this After Visit Summary.  MyChart is used to connect with patients for Virtual Visits (Telemedicine).  Patients are able to view lab/test results, encounter notes, upcoming appointments, etc.  Non-urgent messages can be sent to your provider as well.   To learn more about what you can do with MyChart, go to [email or web address].    Your next appointment:   12 month(s) You will receive a reminder letter in the mail two months in advance. If you don't receive a letter, please call our office to schedule the follow-up appointment.   The format for your next appointment:   In Person  Provider:   You may see Skeet Latch, MD or one of the following Advanced Practice Providers on your designated Care Team:    Kerin Ransom, PA-C  [place], [place]  Coletta Memos, [place]

## 2019-09-29 NOTE — Progress Notes (Signed)
Cardiology Office Note   Date:  09/29/2019   ID:  Jennifer Peck, DOB 1922-04-08, MRN [id number]  PCP:  Tammi Sou, MD  Cardiologist:   Skeet Latch, MD   No chief complaint on file.   History of Present Illness: Jennifer Peck is a 86 y.o. female with CAD s/p PCI, hypertension and hyperlipidemia who presents for follow-up.  Jennifer Peck was previously patient of [name]. She last saw him 02/2015.  She had a bare metal stents placed in her left circumflex, RCA, OM 1 and OM 2 vessels in 2011.  She has not tolerated statin medications or Zetia in the past due to myalgias.  She has tried to manage her lipids with diet and exercise.  She was not interested in trying a PCSK9 inhibitor.    Lately she has been feeling well.  She exercises at home with [facility] from [facility] for 30-90 minutes several times per week.  She feels good with exercise.  She has no exertional chest pain or shortness of breath.  She denies lower extremity edema, orthopnea, or PND.  At home her BP has been mostly in 120-130s/70s,  occasioinally 110s or 140/150.  Her heart rate has been in the 50-60s.  She has no lightheadedness or dizziness.  She has been out of metoprolol for the last couple of days.   Past Medical History:  Diagnosis Date  . Allergic state 04/30/2011  . Anxiety   . BCC (basal cell carcinoma), face 11/06/2010  . Bilateral bunions 11/06/2010  . Chronic venous insufficiency    lymphedema + venous stasis pigmentation changes  . Constipation 11/06/2010  . Coronary artery disease 2011   BMS x 3  . GERD (gastroesophageal reflux disease) 05/11/2012  . History of hyperkalemia 01/23/2011  . Hyperlipidemia    Intolerant of statins and zetia.  Refused to consider PCSK9 inhibitor initially but as of 02/2017 cardiol f/u she would consider repatha if approved.  . Hypertension    +white coat component  . Lesion of labia 02/04/2012  . Melanoma in situ [facility]) 08/2016   Derm= Dr. Renda Rolls  [facility] [place].  . Osteoarthritis of both knees    R>L  . Osteopenia 11/23/2010; 02/2016   2017 T-score -1.9.  Repeat DEXA 2 yrs.  . Peripheral neuropathy 11/06/2010   Small fiber, non-diabetic per neurologist  . Right lumbar radiculopathy 01/2015   Improved with PT  . SCC (squamous cell carcinoma), arm 11/06/2010  . Seborrheic keratosis 04/30/2011  . Vertigo     Past Surgical History:  Procedure Laterality Date  . CARDIAC CATHETERIZATION  12/20/2009   3 bare metal stents  . DEXA  03/05/2016   T-score -1.9  . EYE SURGERY     cataract- left eye 09-24-11  . SKIN BIOPSY  08/2012   Shave excision of lesion on pt's back: irritated seb keratosis.  . TRANSTHORACIC ECHOCARDIOGRAM  12/05/2009   Mild LVH, normal systolic fxn, mild impaired relaxation.  No signif valvular dz.  . TUBAL LIGATION       Current Outpatient Medications  Medication Sig Dispense Refill  . ALPRAZolam (XANAX) 0.25 MG tablet Take 1 tablet (0.25 mg total) by mouth daily as needed. Panic attack 15 tablet 0  . amLODipine (NORVASC) 2.5 MG tablet Take 1 tablet (2.5 mg total) by mouth daily. 90 tablet 3  . aspirin 81 MG tablet Take 81 mg by mouth daily.    Marland Kitchen azelastine (OPTIVAR) 0.05 % ophthalmic solution  INSTILL TWO DROPS INTO EACH EYE TWICE DAILY 6 mL 12  . Benfotiamine 150 MG CAPS Take 1 capsule by mouth daily. For numbness in feet    . Cyanocobalamin (VITAMIN B-12 CR PO) Take 1 tablet by mouth once a week.     . enalapril (VASOTEC) 20 MG tablet TAKE 1 IN THE MORNING AND 1/2 AT BEDTIME 135 tablet 3  . fluocinonide (LIDEX) 0.05 % external solution Apply 1 application topically daily as needed.    Marland Kitchen ketoconazole (NIZORAL) 2 % shampoo     . magnesium oxide (MAG-OX) 400 MG tablet Take 400 mg by mouth once a week.     . metoprolol succinate (TOPROL-XL) 25 MG 24 hr tablet Take 1 tablet (25 mg total) by mouth daily. 90 tablet 3  . Multiple Vitamin (MULTIVITAMIN) tablet Take 1 tablet by mouth once a week. Takes  occ    . Naftifine HCl (NAFTIN) 2 % GEL Apply to affected area qd-bid 60 g 3  . Omega-3 Fatty Acids (FISH OIL PO) Take 1 capsule by mouth once a week.      No current facility-administered medications for this visit.    Allergies:   Chlorthalidone, Atenolol, Besivance [besifloxacin hcl], Codeine, Crestor [rosuvastatin], Gabapentin, Lipitor [atorvastatin], Lovastatin, Simvastatin, Sulfa antibiotics, Vigamox [moxifloxacin], and Welchol [colesevelam hcl]    Social History:  The patient  reports that she quit smoking about 42 years ago. She has never used smokeless tobacco. She reports current alcohol use. She reports that she does not use drugs.   Family History:  The patient's family history includes Asthma in her paternal grandfather; Cancer in her daughter; Heart disease in her brother and father; Hyperlipidemia in her daughter and son; Hypertension in her daughter, daughter, son, son, and son; Stroke in her mother and paternal grandmother; Vision loss in her maternal grandfather.    ROS:  Please see the history of present illness.   Otherwise, review of systems are positive for none.   All other systems are reviewed and negative.    PHYSICAL EXAM: VS:  BP (!) 150/74   Pulse 77   Temp 97.7 F (36.5 C)   Ht 5' (1.524 m)   Wt 138 lb 6.4 oz (62.8 kg)   SpO2 97%   BMI 27.03 kg/m  , BMI Body mass index is 27.03 kg/m. GENERAL:  Well appearing HEENT: Pupils equal round and reactive, fundi not visualized, oral mucosa unremarkable NECK:  No jugular venous distention, waveform within normal limits, carotid upstroke brisk and symmetric, no bruits LUNGS:  Clear to auscultation bilaterally HEART:  RRR.  PMI not displaced or sustained,S1 and S2 within normal limits, no S3, no S4, no clicks, no rubs, no murmurs ABD:  Flat, positive bowel sounds normal in frequency in pitch, no bruits, no rebound, no guarding, no midline pulsatile mass, no hepatomegaly, no splenomegaly EXT:  2 plus pulses  throughout, no edema, no cyanosis no clubbing SKIN:  No rashes no nodules NEURO:  Cranial nerves II through XII grossly intact, motor grossly intact throughout PSYCH:  Cognitively intact, oriented to person place and time  EKG:  EKG is ordered today. The ekg ordered 03/14/17 demonstrates sinus rhythm. Rate 77 bpm.   02/19/17: Sinus rhythm.  Rate 70 bpm.  PVCs.  11/13/17: Sinus rhythm.  Rate 78 bpm.  Non-specific ST changes.   09/29/19: Sinus rhythm.  Rate 77 bpm.  PACs.     Recent Labs: 12/15/2018: ALT 12; BUN 13; Creatinine, Ser 0.75; Potassium 4.4; Sodium 137  Lipid Panel    Component Value Date/Time   CHOL 232 (H) 01/30/2017 0904   TRIG 98.0 01/30/2017 0904   HDL 68.80 01/30/2017 0904   CHOLHDL 3 01/30/2017 0904   VLDL 19.6 01/30/2017 0904   LDLCALC 144 (H) 01/30/2017 0904   LDLDIRECT 128.0 12/02/2012 0837      Wt Readings from Last 3 Encounters:  09/29/19 138 lb 6.4 oz (62.8 kg)  03/05/19 130 lb (59 kg)  01/08/19 130 lb (59 kg)      ASSESSMENT AND PLAN:  # CAD:  # Hyperlipidemia:  Ms. Detlefsen is doing well.  She exercises regularly and has no exertional symptoms.  Continue aspirin and metoprolol.  She has been unable to tolerate statins or Zetia.  She hasn't wanted to try a PCSK9 inhibitor.  She will continue to work on diet and exercise.  Continue Omega 3.  Check lipids/CMP today.  Continue aspirin, fish oil, and metoprolol.  # PVC/PACs: Asymptmatic.  Continue metoprolol.    # Hypertension:  BP is mostly controlled.  It has been better at home.  Refill metoprolol.  Continue amlodipine and enalapril.  Current medicines are reviewed at length with the patient today.  The patient does not have concerns regarding medicines.  The following changes have been made:  no change  Labs/ tests ordered today include:   Orders Placed This Encounter  Procedures  . Lipid panel  . Comprehensive metabolic panel  . EKG 12-Lead    Disposition:   FU with Tiea Manninen C. Oval Linsey,  MD, [facility] in 1 year.    Signed, Chava Dulac C. Oval Linsey, MD, [facility]  09/29/2019 12:34 PM    [place]

## 2019-09-30 LAB — COMPREHENSIVE METABOLIC PANEL
ALT: 13 IU/L (ref 0–32)
AST: 8 IU/L (ref 0–40)
Albumin/Globulin Ratio: 1.6 (ref 1.2–2.2)
Albumin: 4.5 g/dL (ref 3.5–4.6)
Alkaline Phosphatase: 65 IU/L (ref 48–121)
BUN/Creatinine Ratio: 18 (ref 12–28)
BUN: 13 mg/dL (ref 10–36)
Bilirubin Total: 0.5 mg/dL (ref 0.0–1.2)
CO2: 22 mmol/L (ref 20–29)
Calcium: 9.7 mg/dL (ref 8.7–10.3)
Chloride: 102 mmol/L (ref 96–106)
Creatinine, Ser: 0.71 mg/dL (ref 0.57–1.00)
GFR calc Af Amer: 84 mL/min/{1.73_m2} (ref >59)
GFR calc non Af Amer: 73 mL/min/{1.73_m2} (ref >59)
Globulin, Total: 2.8 g/dL (ref 1.5–4.5)
Glucose: 98 mg/dL (ref 65–99)
Potassium: 4.7 mmol/L (ref 3.5–5.2)
Sodium: 138 mmol/L (ref 134–144)
Total Protein: 7.3 g/dL (ref 6.0–8.5)

## 2019-09-30 LAB — LIPID PANEL
Chol/HDL Ratio: 3.3 ratio (ref 0.0–4.4)
Cholesterol, Total: 234 mg/dL — ABNORMAL HIGH (ref 100–199)
HDL: 72 mg/dL (ref >39)
LDL Chol Calc (NIH): 146 mg/dL — ABNORMAL HIGH (ref 0–99)
Triglycerides: 95 mg/dL (ref 0–149)
VLDL Cholesterol Cal: 16 mg/dL (ref 5–40)

## 2019-10-28 ENCOUNTER — Ambulatory Visit (INDEPENDENT_AMBULATORY_CARE_PROVIDER_SITE_OTHER): Payer: Medicare Other | Admitting: Podiatry

## 2019-10-28 ENCOUNTER — Other Ambulatory Visit: Payer: Self-pay

## 2019-10-28 ENCOUNTER — Encounter: Payer: Self-pay | Admitting: Podiatry

## 2019-10-28 DIAGNOSIS — L84 Corns and callosities: Secondary | ICD-10-CM | POA: Diagnosis not present

## 2019-10-28 DIAGNOSIS — G629 Polyneuropathy, unspecified: Secondary | ICD-10-CM | POA: Diagnosis not present

## 2019-10-28 DIAGNOSIS — M79675 Pain in left toe(s): Secondary | ICD-10-CM | POA: Diagnosis not present

## 2019-10-28 DIAGNOSIS — M79674 Pain in right toe(s): Secondary | ICD-10-CM

## 2019-10-28 DIAGNOSIS — B351 Tinea unguium: Secondary | ICD-10-CM | POA: Diagnosis not present

## 2019-10-28 NOTE — Progress Notes (Signed)
Subjective: Jennifer Peck is a pleasant 86 y.o. female patient seen today at risk foot care with history of peripheral neuropathy and painful corn(s) interdigitally lateral left 2nd toe and distal tip right 2nd digit and painful thick toenails that are difficult to trim. Painful toenails interfere with ambulation. Aggravating factors include wearing enclosed shoe gear. Pain is relieved with periodic professional debridement. Painful corns are aggravated when weightbearing when wearing enclosed shoe gear. Pain is relieved with periodic professional debridement.   Patient's daughter is present during thei visit on today. She did purchase a pair of Arcopedicos, but states her heels are slipping out of them.  Patient Active Problem List   Diagnosis Date Noted  . Essential hypertension 11/20/2017  . Anxiety 11/20/2017  . Vertigo 11/20/2017  . Coronary atherosclerosis 10/24/2017  . Mononeuritis 10/24/2017  . Skin tear of lower leg without complication 14/78/2956  . Right knee pain 01/05/2014  . Strain of right knee 12/07/2013  . Osteoarthritis 12/07/2013  . Preventative health care 10/07/2013  . Allergic conjunctivitis 06/03/2013  . Esophageal reflux 05/11/2012  . Lesion of labia 02/04/2012  . Hyperlipidemia 04/30/2011  . Allergic state 04/30/2011  . Seborrheic keratosis 04/30/2011  . Hyperkalemia 01/23/2011  . Hyponatremia 11/23/2010  . Osteopenia 11/23/2010  . BCC (basal cell carcinoma), face 11/06/2010  . SCC (squamous cell carcinoma), arm 11/06/2010  . Bilateral bunions 11/06/2010  . Peripheral neuropathy 11/06/2010  . Constipation 11/06/2010  . Left leg pain 11/06/2010  . PAC (premature atrial contraction) 06/06/2010  . Polypharmacy 06/06/2010  . Pure hypercholesterolemia 06/06/2010  . Ischemic heart disease 06/06/2010    Current Outpatient Medications on File Prior to Visit  Medication Sig Dispense Refill  . ALPRAZolam (XANAX) 0.25 MG tablet Take 1 tablet (0.25 mg total) by  mouth daily as needed. Panic attack 15 tablet 0  . amLODipine (NORVASC) 2.5 MG tablet Take 1 tablet (2.5 mg total) by mouth daily. 90 tablet 3  . aspirin 81 MG tablet Take 81 mg by mouth daily.    Marland Kitchen azelastine (OPTIVAR) 0.05 % ophthalmic solution INSTILL TWO DROPS INTO EACH EYE TWICE DAILY 6 mL 12  . Benfotiamine 150 MG CAPS Take 1 capsule by mouth daily. For numbness in feet    . Cyanocobalamin (VITAMIN B-12 CR PO) Take 1 tablet by mouth once a week.     . enalapril (VASOTEC) 20 MG tablet TAKE 1 IN THE MORNING AND 1/2 AT BEDTIME 135 tablet 3  . fluocinonide (LIDEX) 0.05 % external solution Apply 1 application topically daily as needed.    Marland Kitchen ketoconazole (NIZORAL) 2 % shampoo     . magnesium oxide (MAG-OX) 400 MG tablet Take 400 mg by mouth once a week.     . metoprolol succinate (TOPROL-XL) 25 MG 24 hr tablet Take 1 tablet (25 mg total) by mouth daily. 90 tablet 3  . Multiple Vitamin (MULTIVITAMIN) tablet Take 1 tablet by mouth once a week. Takes occ    . Naftifine HCl (NAFTIN) 2 % GEL Apply to affected area qd-bid 60 g 3  . Omega-3 Fatty Acids (FISH OIL PO) Take 1 capsule by mouth once a week.      No current facility-administered medications on file prior to visit.    Allergies  Allergen Reactions  . Chlorthalidone     hyponatremia  . Atenolol     Caused fatigue   . Besivance [Besifloxacin Hcl]     rash  . Codeine Nausea And Vomiting  . Crestor [Rosuvastatin]     "  leg gave away"  . Gabapentin     Memory loss and confusion  . Lipitor [Atorvastatin] Other (See Comments)    Severe leg pain  . Lovastatin     myalgia  . Simvastatin     Her leg gave away on her and states thinks secondary to Simvastatin  . Sulfa Antibiotics Nausea Only  . Vigamox [Moxifloxacin]     rash  . Welchol [Colesevelam Hcl]     Objective: Physical Exam  General: KEYSHLA TUNISON is a pleasant 86 y.o. Caucasian female, in NAD. AAO x 3.   Vascular:  Neurovascular status unchanged b/l lower  extremities. Capillary refill time to digits immediate b/l. Palpable DP pulses b/l. Palpable PT pulses b/l. Pedal hair sparse b/l. Skin temperature gradient within normal limits b/l.  Dermatological:  Pedal skin with normal turgor, texture and tone bilaterally. No open wounds bilaterally. No interdigital macerations bilaterally. Toenails 1-5 b/l elongated, discolored, dystrophic, thickened, crumbly with subungual debris and tenderness to dorsal palpation. Hyperkeratotic lesion(s) L 2nd toe, L 3rd toe and R 2nd toe.  No erythema, no edema, no drainage, no flocculence.  Musculoskeletal:  Normal muscle strength 5/5 to all lower extremity muscle groups bilaterally. No pain crepitus or joint limitation noted with ROM b/l. Hallux valgus with bunion deformity noted b/l lower extremities. Left bunion is noted to possess blanchable erythema. No warmth, no flocculence. Hammertoes noted to the L 2nd toe, L 3rd toe, L 4th toe, R 2nd toe, R 3rd toe and R 4th toe.  She is wearing [employer] which do not appear to be adequate in width of the forefoot area.  Neurological:  Protective sensation diminished with 10g monofilament b/l.  Assessment and Plan:  1. Pain due to onychomycosis of toenails of both feet   2. Corns   3. Neuropathy   -Examined patient. -Toenails 1-5 b/l were debrided in length and girth with sterile nail nippers and dremel without iatrogenic bleeding.  -Corn(s) b/l 2nd digits pared utilizing sterile scalpel blade without complication or incident. Total number debrided=2. -For bunion left foot, foam bunion shield was dispensed. -Recommended they bring in [place] so I could help them with heel slippage issue. -Continue toe separators for interdigital corns. -Patient to report any pedal injuries to medical professional immediately. -Patient/POA to call should there be question/concern in the interim.  Return in about 9 weeks (around 12/30/2019) for nail and callus  trim.  Marzetta Board, DPM

## 2020-02-02 ENCOUNTER — Other Ambulatory Visit: Payer: Self-pay

## 2020-02-02 ENCOUNTER — Ambulatory Visit (INDEPENDENT_AMBULATORY_CARE_PROVIDER_SITE_OTHER): Payer: Medicare Other | Admitting: Podiatry

## 2020-02-02 ENCOUNTER — Encounter: Payer: Self-pay | Admitting: Podiatry

## 2020-02-02 DIAGNOSIS — M79674 Pain in right toe(s): Secondary | ICD-10-CM | POA: Diagnosis not present

## 2020-02-02 DIAGNOSIS — M79675 Pain in left toe(s): Secondary | ICD-10-CM | POA: Diagnosis not present

## 2020-02-02 DIAGNOSIS — L84 Corns and callosities: Secondary | ICD-10-CM | POA: Diagnosis not present

## 2020-02-02 DIAGNOSIS — G629 Polyneuropathy, unspecified: Secondary | ICD-10-CM | POA: Diagnosis not present

## 2020-02-02 DIAGNOSIS — B351 Tinea unguium: Secondary | ICD-10-CM | POA: Diagnosis not present

## 2020-02-05 NOTE — Progress Notes (Signed)
Subjective: Jennifer Peck is a pleasant 86 y.o. female patient seen today at risk foot care with history of peripheral neuropathy and painful corn(s) interdigitally lateral left 2nd toe and distal tip right 2nd digit and painful thick toenails that are difficult to trim. Painful toenails interfere with ambulation. Aggravating factors include wearing enclosed shoe gear. Pain is relieved with periodic professional debridement. Painful corns are aggravated when weightbearing when wearing enclosed shoe gear. Pain is relieved with periodic professional debridement.   Patient's daughter is present during thei visit on today. Jennifer Peck has brought in her new Arcopedicos for me to assess. She has purchased a size 40 from [employer].  Patient Active Problem List   Diagnosis Date Noted  . Essential hypertension 11/20/2017  . Anxiety 11/20/2017  . Vertigo 11/20/2017  . Coronary atherosclerosis 10/24/2017  . Mononeuritis 10/24/2017  . Skin tear of lower leg without complication 89/16/9450  . Right knee pain 01/05/2014  . Strain of right knee 12/07/2013  . Osteoarthritis 12/07/2013  . Preventative health care 10/07/2013  . Allergic conjunctivitis 06/03/2013  . Esophageal reflux 05/11/2012  . Lesion of labia 02/04/2012  . Hyperlipidemia 04/30/2011  . Allergic state 04/30/2011  . Seborrheic keratosis 04/30/2011  . Hyperkalemia 01/23/2011  . Hyponatremia 11/23/2010  . Osteopenia 11/23/2010  . BCC (basal cell carcinoma), face 11/06/2010  . SCC (squamous cell carcinoma), arm 11/06/2010  . Bilateral bunions 11/06/2010  . Peripheral neuropathy 11/06/2010  . Constipation 11/06/2010  . Left leg pain 11/06/2010  . PAC (premature atrial contraction) 06/06/2010  . Polypharmacy 06/06/2010  . Pure hypercholesterolemia 06/06/2010  . Ischemic heart disease 06/06/2010    Current Outpatient Medications on File Prior to Visit  Medication Sig Dispense Refill  . ALPRAZolam (XANAX) 0.25 MG tablet Take 1  tablet (0.25 mg total) by mouth daily as needed. Panic attack 15 tablet 0  . amLODipine (NORVASC) 2.5 MG tablet Take 1 tablet (2.5 mg total) by mouth daily. 90 tablet 3  . aspirin 81 MG tablet Take 81 mg by mouth daily.    Marland Kitchen azelastine (OPTIVAR) 0.05 % ophthalmic solution INSTILL TWO DROPS INTO EACH EYE TWICE DAILY 6 mL 12  . Benfotiamine 150 MG CAPS Take 1 capsule by mouth daily. For numbness in feet    . Cyanocobalamin (VITAMIN B-12 CR PO) Take 1 tablet by mouth once a week.     . enalapril (VASOTEC) 20 MG tablet TAKE 1 IN THE MORNING AND 1/2 AT BEDTIME 135 tablet 3  . fluocinonide (LIDEX) 0.05 % external solution Apply 1 application topically daily as needed.    Marland Kitchen ketoconazole (NIZORAL) 2 % shampoo     . magnesium oxide (MAG-OX) 400 MG tablet Take 400 mg by mouth once a week.     . metoprolol succinate (TOPROL-XL) 25 MG 24 hr tablet Take 1 tablet (25 mg total) by mouth daily. 90 tablet 3  . Multiple Vitamin (MULTIVITAMIN) tablet Take 1 tablet by mouth once a week. Takes occ    . Naftifine HCl (NAFTIN) 2 % GEL Apply to affected area qd-bid 60 g 3  . Omega-3 Fatty Acids (FISH OIL PO) Take 1 capsule by mouth once a week.      No current facility-administered medications on file prior to visit.    Allergies  Allergen Reactions  . Chlorthalidone     hyponatremia  . Atenolol     Caused fatigue   . Besivance [Besifloxacin Hcl]     rash  . Codeine Nausea And Vomiting  .  Crestor [Rosuvastatin]     "leg gave away"  . Gabapentin     Memory loss and confusion  . Lipitor [Atorvastatin] Other (See Comments)    Severe leg pain  . Lovastatin     myalgia  . Simvastatin     Her leg gave away on her and states thinks secondary to Simvastatin  . Sulfa Antibiotics Nausea Only  . Vigamox [Moxifloxacin]     rash  . Welchol [Colesevelam Hcl]     Objective: Physical Exam  General: Jennifer Peck is a pleasant 86 y.o. Caucasian female, in NAD. AAO x 3.   Vascular:  Neurovascular status  unchanged b/l lower extremities. Capillary refill time to digits immediate b/l. Palpable DP pulses b/l. Palpable PT pulses b/l. Pedal hair sparse b/l. Skin temperature gradient within normal limits b/l.  Dermatological:  Pedal skin with normal turgor, texture and tone bilaterally. No open wounds bilaterally. No interdigital macerations bilaterally. Toenails 1-5 b/l elongated, discolored, dystrophic, thickened, crumbly with subungual debris and tenderness to dorsal palpation. Hyperkeratotic lesion(s) L 2nd toe, R 3rd toe and submet head 5 right foot.  No erythema, no edema, no drainage, no fluctuance.  Musculoskeletal:  Normal muscle strength 5/5 to all lower extremity muscle groups bilaterally. No pain crepitus or joint limitation noted with ROM b/l. Hallux valgus with bunion deformity noted b/l lower extremities. Left bunion is noted to possess blanchable erythema. No warmth, no flocculence. Hammertoes noted to the L 2nd toe, L 3rd toe, L 4th toe, R 2nd toe, R 3rd toe and R 4th toe.  She brought in a pair of Arcopedico knit shoes. They are size 40. She has significant heel slippage in the shoe.  Neurological:  Protective sensation diminished with 10g monofilament b/l.  Assessment and Plan:  1. Pain due to onychomycosis of toenails of both feet   2. Corns and callosities   3. Neuropathy     -Examined patient. -Evaluated her new Arcopedico shoes. The shoes are loose around her heels. Advised them to try a size 39. They related understanding and will exchange the shoes for a smaller size. -Toenails 1-5 b/l were debrided in length and girth with sterile nail nippers and dremel without iatrogenic bleeding.  -Corn(s) left 2nd toe, distal right 3rd digit pared utilizing sterile scalpel blade without complication or incident. Total number debrided=2. -Calluses pared submetatarsal head(s) 5 right foot utilizing sterile scalpel blade without incident.Total number debrided=1. -Continue toe separators  for interdigital corns. -Patient to report any pedal injuries to medical professional immediately. -Patient/POA to call should there be question/concern in the interim.  Return in about 3 months (around 05/04/2020).  Marzetta Board, DPM

## 2020-02-09 ENCOUNTER — Ambulatory Visit (INDEPENDENT_AMBULATORY_CARE_PROVIDER_SITE_OTHER): Payer: Medicare Other | Admitting: Family Medicine

## 2020-02-09 ENCOUNTER — Other Ambulatory Visit: Payer: Self-pay

## 2020-02-09 ENCOUNTER — Encounter: Payer: Self-pay | Admitting: Family Medicine

## 2020-02-09 VITALS — BP 134/75 | HR 69 | Temp 98.3°F | Ht 60.0 in | Wt 133.0 lb

## 2020-02-09 DIAGNOSIS — I2510 Atherosclerotic heart disease of native coronary artery without angina pectoris: Secondary | ICD-10-CM

## 2020-02-09 DIAGNOSIS — I10 Essential (primary) hypertension: Secondary | ICD-10-CM

## 2020-02-09 DIAGNOSIS — I872 Venous insufficiency (chronic) (peripheral): Secondary | ICD-10-CM

## 2020-02-09 DIAGNOSIS — Z85828 Personal history of other malignant neoplasm of skin: Secondary | ICD-10-CM | POA: Diagnosis not present

## 2020-02-09 DIAGNOSIS — E7800 Pure hypercholesterolemia, unspecified: Secondary | ICD-10-CM | POA: Diagnosis not present

## 2020-02-09 DIAGNOSIS — Z23 Encounter for immunization: Secondary | ICD-10-CM | POA: Diagnosis not present

## 2020-02-09 LAB — BASIC METABOLIC PANEL
BUN: 14 mg/dL (ref 6–23)
CO2: 27 mEq/L (ref 19–32)
Calcium: 9.8 mg/dL (ref 8.4–10.5)
Chloride: 97 mEq/L (ref 96–112)
Creatinine, Ser: 0.74 mg/dL (ref 0.40–1.20)
GFR: 68.85 mL/min (ref >60.00)
Glucose, Bld: 90 mg/dL (ref 70–99)
Potassium: 4.1 mEq/L (ref 3.5–5.1)
Sodium: 132 mEq/L — ABNORMAL LOW (ref 135–145)

## 2020-02-09 NOTE — Progress Notes (Signed)
OFFICE VISIT  02/09/2020  CC:  Chief Complaint  Patient presents with  . Follow-up    Pt is not fasting    HPI:    Patient is a 86 y.o. Caucasian female who presents for f/u HTN, LE venous insufficiency edema, CAD. Has hx of HLD ->statin and zetia intol. I last saw her 12/15/18. A/P as of that visit: "1) HTN: The current medical regimen is effective;  continue present plan and medications. Lytes/cr today.  2) Hx of hyperkalemia: low K diet. BMET today.  3) Bilat LL venous insufficiency dz, lymphedema, along with chronic small fiber peripheral neuropathy. I reassured her that her skin changes will wax and wane and that the only thing that may make her legs feel better from a swelling standpoint of swelling is compression stockings so I rx'd these for her today.  4) Bilat posterior neck soft tissue pain: I reassured her that I do not think her sx's have anything to do with her heart, but encouraged her to keep her already scheduled appt with her cardiologist coming up soon."  INTERIM HX: She saw Dr. Gaetano Hawthorne, cardiologist, for f/u CAD 09/29/19, note reviewed today. All stable, no changes in med regimen (ASA, BB, fish oil).  LDL was 146 so nexletol offered, pt was to get back with her if she wanted trial of this med. Renal function normal, all electrolytes normal (K+ 4.7).  Doing well. Scared of covid still so she has been staying home.  Has had booster vaccine. Does 1 hour of exercise M/W/F: has no CP, SOB, dizziness, or signif fatigue.  Not checking bp at home anymore: "I got out of the habit". Compliant with ASA, metop, enalapril, amlodipine.  Expecting family for T-giving holiday.  ROS: no fevers, no CP, no SOB, no wheezing, no cough, no dizziness, no HAs, no rashes, no melena/hematochezia.  No polyuria or polydipsia.  No myalgias or arthralgias.  No focal weakness, paresthesias, or tremors.  No acute vision or hearing abnormalities. No n/v/d or abd pain.  No palpitations.      Past Medical History:  Diagnosis Date  . Allergic state 04/30/2011  . Anxiety   . BCC (basal cell carcinoma), face 11/06/2010  . Bilateral bunions 11/06/2010  . Chronic venous insufficiency    lymphedema + venous stasis pigmentation changes  . Constipation 11/06/2010  . Coronary artery disease 2011   BMS x 3  . GERD (gastroesophageal reflux disease) 05/11/2012  . History of hyperkalemia 01/23/2011  . Hyperlipidemia    Intolerant of statins and zetia.  Refused to consider PCSK9 inhibitor initially but as of 02/2017 cardiol f/u she would consider repatha if approved.  . Hypertension    +white coat component  . Lesion of labia 02/04/2012  . Melanoma in situ [facility]) 08/2016   Derm= Dr. Renda Rolls @ [facility] in [place].  . Osteoarthritis of both knees    R>L  . Osteopenia 11/23/2010; 02/2016   2017 T-score -1.9.  Repeat DEXA 2 yrs.  . Peripheral neuropathy 11/06/2010   Small fiber, non-diabetic per neurologist  . Right lumbar radiculopathy 01/2015   Improved with PT  . SCC (squamous cell carcinoma), arm 11/06/2010  . Seborrheic keratosis 04/30/2011  . Vertigo     Past Surgical History:  Procedure Laterality Date  . CARDIAC CATHETERIZATION  12/20/2009   3 bare metal stents  . DEXA  03/05/2016   T-score -1.9  . EYE SURGERY     cataract- left eye 09-24-11  . SKIN BIOPSY  08/2012  Shave excision of lesion on pt's back: irritated seb keratosis.  . TRANSTHORACIC ECHOCARDIOGRAM  12/05/2009   Mild LVH, normal systolic fxn, mild impaired relaxation.  No signif valvular dz.  . TUBAL LIGATION      Outpatient Medications Prior to Visit  Medication Sig Dispense Refill  . amLODipine (NORVASC) 2.5 MG tablet Take 1 tablet (2.5 mg total) by mouth daily. 90 tablet 3  . aspirin 81 MG tablet Take 81 mg by mouth daily.    Marland Kitchen azelastine (OPTIVAR) 0.05 % ophthalmic solution INSTILL TWO DROPS INTO EACH EYE TWICE DAILY 6 mL 12  . Benfotiamine 150 MG CAPS Take 1 capsule by mouth daily. For  numbness in feet    . Cyanocobalamin (VITAMIN B-12 CR PO) Take 1 tablet by mouth once a week.     . enalapril (VASOTEC) 20 MG tablet TAKE 1 IN THE MORNING AND 1/2 AT BEDTIME 135 tablet 3  . fluocinonide (LIDEX) 0.05 % external solution Apply 1 application topically daily as needed.    Marland Kitchen ketoconazole (NIZORAL) 2 % shampoo     . magnesium oxide (MAG-OX) 400 MG tablet Take 400 mg by mouth once a week.     . metoprolol succinate (TOPROL-XL) 25 MG 24 hr tablet Take 1 tablet (25 mg total) by mouth daily. 90 tablet 3  . Multiple Vitamin (MULTIVITAMIN) tablet Take 1 tablet by mouth once a week. Takes occ    . Naftifine HCl (NAFTIN) 2 % GEL Apply to affected area qd-bid 60 g 3  . Omega-3 Fatty Acids (FISH OIL PO) Take 1 capsule by mouth once a week.     . ALPRAZolam (XANAX) 0.25 MG tablet Take 1 tablet (0.25 mg total) by mouth daily as needed. Panic attack (Patient not taking: Reported on 02/09/2020) 15 tablet 0   No facility-administered medications prior to visit.    Allergies  Allergen Reactions  . Chlorthalidone     hyponatremia  . Atenolol     Caused fatigue   . Besivance [Besifloxacin Hcl]     rash  . Codeine Nausea And Vomiting  . Crestor [Rosuvastatin]     "leg gave away"  . Gabapentin     Memory loss and confusion  . Lipitor [Atorvastatin] Other (See Comments)    Severe leg pain  . Lovastatin     myalgia  . Simvastatin     Her leg gave away on her and states thinks secondary to Simvastatin  . Sulfa Antibiotics Nausea Only  . Vigamox [Moxifloxacin]     rash  . Welchol [Colesevelam Hcl]     ROS As per HPI  PE: Vitals with BMI 02/09/2020 09/29/2019 09/29/2019  Height 5\' 0"  - 5\' 0"   Weight 133 lbs - 138 lbs 6 oz  BMI 62.22 - 97.98  Systolic 921 194 174  Diastolic 75 74 081  Pulse 69 - 77   Gen: Alert, well appearing.  Patient is oriented to person, place, time, and situation. AFFECT: pleasant, lucid thought and speech. KGY:JEHU: no injection, icteris, swelling, or  exudate.  EOMI, PERRLA. Mouth: lips without lesion/swelling.  Oral mucosa pink and moist. Oropharynx without erythema, exudate, or swelling.  CV: RRR with occ ectopy noted, no m/r/g.   LUNGS: CTA bilat, nonlabored resps, good aeration in all lung fields. EXT: no clubbing or cyanosis.  No pitting edema.    LABS:  Lab Results  Component Value Date   TSH 1.36 05/07/2012   Lab Results  Component Value Date   WBC 6.0 10/23/2017  HGB 14.8 10/23/2017   HCT 44.2 10/23/2017   MCV 95.0 10/23/2017   PLT 177.0 10/23/2017   Lab Results  Component Value Date   CREATININE 0.71 09/29/2019   BUN 13 09/29/2019   NA 138 09/29/2019   K 4.7 09/29/2019   CL 102 09/29/2019   CO2 22 09/29/2019   Lab Results  Component Value Date   ALT 13 09/29/2019   AST 8 09/29/2019   ALKPHOS 65 09/29/2019   BILITOT 0.5 09/29/2019   Lab Results  Component Value Date   CHOL 234 (H) 09/29/2019   Lab Results  Component Value Date   HDL 72 09/29/2019   Lab Results  Component Value Date   LDLCALC 146 (H) 09/29/2019   Lab Results  Component Value Date   TRIG 95 09/29/2019   Lab Results  Component Value Date   CHOLHDL 3.3 09/29/2019   Lab Results  Component Value Date   HGBA1C  09/03/2009    5.4 (NOTE)                                                                       According to the ADA Clinical Practice Recommendations for 2011, when HbA1c is used as a screening test:   >=6.5%   Diagnostic of Diabetes Mellitus           (if abnormal result  is confirmed)  5.7-6.4%   Increased risk of developing Diabetes Mellitus  References:Diagnosis and Classification of Diabetes Mellitus,Diabetes ZOXW,9604,54(UJWJX 1):S62-S69 and Standards of Medical Care in         Diabetes - 2011,Diabetes Care,2011,34  (Suppl 1):S11-S61.    IMPRESSION AND PLAN:  1) HTN: well controlled. BMET today. Cont amlod 2.5mg , toprol 25mg , enal 20mg  qam, 10mg  qpm.  2) Chronic venous insufficiency: edema not a problem at all  at this point.  3) CAD/HLD: statin and zetia intol. LDL 146 July this year, pt declined nexletol at that time. Cont healthy diet and maximal exercise.  4) Hx of BCC skin: pt requests referral to new dermatologist so I ordered this today.  An After Visit Summary was printed and given to the patient.  FOLLOW UP: No follow-ups on file.  Signed:  Crissie Sickles, MD           02/09/2020

## 2020-02-16 NOTE — Progress Notes (Signed)
Subjective:   Jennifer Peck is a 86 y.o. female who presents for Medicare Annual (Subsequent) preventive examination.  I connected with Maylie today by telephone and verified that I am speaking with the correct person using two identifiers. Location patient: home Location provider: work Persons participating in the virtual visit: patient, Marine scientist.    I discussed the limitations, risks, security and privacy concerns of performing an evaluation and management service by telephone and the availability of in person appointments. I also discussed with the patient that there may be a patient responsible charge related to this service. The patient expressed understanding and verbally consented to this telephonic visit.    Interactive audio and video telecommunications were attempted between this provider and patient, however failed, due to patient having technical difficulties OR patient did not have access to video capability.  We continued and completed visit with audio only.  Some vital signs may be absent or patient reported.   Time Spent with patient on telephone encounter: 20 minutes  Review of Systems     Cardiac Risk Factors include: advanced age (>32men, >24 women);hypertension;dyslipidemia     Objective:    Today's Vitals   02/17/20 1115  Weight: 133 lb (60.3 kg)  Height: 5' (1.524 m)   Body mass index is 25.97 kg/m.  Advanced Directives 02/17/2020 01/08/2019 05/06/2017 04/30/2016 05/02/2015  Does Patient Have a Medical Advance Directive? Yes Yes Yes Yes No  Type of Advance Directive Living will [place];Living will Living will;Healthcare Power of Attorney Living will -  Copy of [place] in Chart? - No - copy requested No - copy requested - -    Current Medications (verified) Outpatient Encounter Medications as of 02/17/2020  Medication Sig  . amLODipine (NORVASC) 2.5 MG tablet Take 1 tablet (2.5 mg total) by mouth daily.  Marland Kitchen aspirin 81  MG tablet Take 81 mg by mouth daily.  Marland Kitchen azelastine (OPTIVAR) 0.05 % ophthalmic solution INSTILL TWO DROPS INTO EACH EYE TWICE DAILY  . Benfotiamine 150 MG CAPS Take 1 capsule by mouth daily. For numbness in feet  . Cyanocobalamin (VITAMIN B-12 CR PO) Take 1 tablet by mouth once a week.   . enalapril (VASOTEC) 20 MG tablet TAKE 1 IN THE MORNING AND 1/2 AT BEDTIME  . fluocinonide (LIDEX) 0.05 % external solution Apply 1 application topically daily as needed.  Marland Kitchen ketoconazole (NIZORAL) 2 % shampoo   . magnesium oxide (MAG-OX) 400 MG tablet Take 400 mg by mouth once a week.   . metoprolol succinate (TOPROL-XL) 25 MG 24 hr tablet Take 1 tablet (25 mg total) by mouth daily.  . Multiple Vitamin (MULTIVITAMIN) tablet Take 1 tablet by mouth once a week. Takes occ  . Naftifine HCl (NAFTIN) 2 % GEL Apply to affected area qd-bid  . Omega-3 Fatty Acids (FISH OIL PO) Take 1 capsule by mouth once a week.    No facility-administered encounter medications on file as of 02/17/2020.    Allergies (verified) Chlorthalidone, Atenolol, Besivance [besifloxacin hcl], Codeine, Crestor [rosuvastatin], Gabapentin, Lipitor [atorvastatin], Lovastatin, Simvastatin, Sulfa antibiotics, Vigamox [moxifloxacin], and Welchol [colesevelam hcl]   History: Past Medical History:  Diagnosis Date  . Allergic state 04/30/2011  . Anxiety   . BCC (basal cell carcinoma), face 11/06/2010  . Bilateral bunions 11/06/2010  . Chronic venous insufficiency    lymphedema + venous stasis pigmentation changes  . Constipation 11/06/2010  . Coronary artery disease 2011   BMS x 3  . GERD (gastroesophageal reflux  disease) 05/11/2012  . History of hyperkalemia 01/23/2011  . Hyperlipidemia    Intolerant of statins and zetia.  Refused to consider PCSK9 inhibitor initially but as of 02/2017 cardiol f/u she would consider repatha if approved.  . Hypertension    +white coat component  . Lesion of labia 02/04/2012  . Melanoma in situ [facility]) 08/2016    Derm= Dr. Renda Rolls @ [facility] in [place].  . Osteoarthritis of both knees    R>L  . Osteopenia 11/23/2010; 02/2016   2017 T-score -1.9.  Repeat DEXA 2 yrs.  . Peripheral neuropathy 11/06/2010   Small fiber, non-diabetic per neurologist  . Right lumbar radiculopathy 01/2015   Improved with PT  . SCC (squamous cell carcinoma), arm 11/06/2010  . Seborrheic keratosis 04/30/2011  . Vertigo    Past Surgical History:  Procedure Laterality Date  . CARDIAC CATHETERIZATION  12/20/2009   3 bare metal stents  . DEXA  03/05/2016   T-score -1.9  . EYE SURGERY     cataract- left eye 09-24-11  . SKIN BIOPSY  08/2012   Shave excision of lesion on pt's back: irritated seb keratosis.  . TRANSTHORACIC ECHOCARDIOGRAM  12/05/2009   Mild LVH, normal systolic fxn, mild impaired relaxation.  No signif valvular dz.  . TUBAL LIGATION     Family History  Problem Relation Age of Onset  . Stroke Mother   . Heart disease Father   . Hypertension Daughter   . Hyperlipidemia Daughter   . Hypertension Son   . Cancer Daughter        rectal/ chemo and radiation  . Hypertension Daughter   . Hypertension Son   . Hyperlipidemia Son   . Hypertension Son   . Heart disease Brother   . Vision loss Maternal Grandfather   . Stroke Paternal Grandmother   . Asthma Paternal Grandfather    Social History   Socioeconomic History  . Marital status: Single    Spouse name: Not on file  . Number of children: Not on file  . Years of education: Not on file  . Highest education level: Not on file  Occupational History  . Not on file  Tobacco Use  . Smoking status: Former Smoker    Quit date: 09/04/1977    Years since quitting: 42.4  . Smokeless tobacco: Never Used  Vaping Use  . Vaping Use: Never used  Substance and Sexual Activity  . Alcohol use: Yes    Comment: only on Holidays  . Drug use: No  . Sexual activity: Not on file  Other Topics Concern  . Not on file  Social History Narrative   Lives  alone near [place].  Two daughters in [place], [place].   Widowed about 30 yrs.   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   Former smoker: approx 30 pack-yr hx, quit 1979.   Occasional social alcohol.   Exercises 3 days a week at [place].   Takes knitting classes and computer classes.   Social Determinants of Health   Financial Resource Strain: Low Risk   . Difficulty of Paying Living Expenses: Not hard at all  Food Insecurity: No Food Insecurity  . Worried About Charity fundraiser in the Last Year: Never true  . Ran Out of Food in the Last Year: Never true  Transportation Needs: No Transportation Needs  . Lack of Transportation (Medical): No  . Lack of Transportation (Non-Medical): No  Physical Activity: Sufficiently Active  .  Days of Exercise per Week: 3 days  . Minutes of Exercise per Session: 90 min  Stress: No Stress Concern Present  . Feeling of Stress : Not at all  Social Connections: Socially Isolated  . Frequency of Communication with Friends and Family: More than three times a week  . Frequency of Social Gatherings with Friends and Family: More than three times a week  . Attends Religious Services: Never  . Active Member of Clubs or Organizations: No  . Attends Archivist Meetings: Never  . Marital Status: Widowed    Tobacco Counseling Counseling given: Not Answered   Clinical Intake:  Pre-visit preparation completed: Yes  Pain : No/denies pain     Nutritional Status: BMI 25 -29 Overweight Nutritional Risks: None Diabetes: No  How often do you need to have someone help you when you read instructions, pamphlets, or other written materials from your doctor or pharmacy?: 1 - Never What is the last grade level you completed in school?: College-Master's degree  Diabetic?No  Interpreter Needed?: No  Information entered by :: Caroleen Hamman LPN   Activities of Daily Living In your present state of  health, do you have any difficulty performing the following activities: 02/17/2020  Hearing? N  Vision? N  Difficulty concentrating or making decisions? N  Walking or climbing stairs? N  Dressing or bathing? N  Doing errands, shopping? N  Preparing Food and eating ? N  Using the Toilet? N  In the past six months, have you accidently leaked urine? N  Do you have problems with loss of bowel control? N  Managing your Medications? N  Managing your Finances? N  Housekeeping or managing your Housekeeping? N  Some recent data might be hidden    Patient Care Team: Tammi Sou, MD as PCP - General (Family Medicine) Skeet Latch, MD as PCP - Cardiology (Cardiology) Skeet Latch, MD as Consulting Physician (Cardiology) [place], Jennefer Bravo, MD as Referring Physician (Dermatology) Garrel Ridgel, [place] as Consulting Physician (Podiatry) Elouise Munroe, MD as Consulting Physician (Cardiology)  Indicate any recent Medical Services you may have received from other than [facility] providers in the past year (date may be approximate).     Assessment:   This is a routine wellness examination for [facility].  Hearing/Vision screen  Hearing Screening   125Hz  250Hz  500Hz  1000Hz  2000Hz  3000Hz  4000Hz  6000Hz  8000Hz   Right ear:           Left ear:           Comments: No issues  Vision Screening Comments: Wears reading glasses Last [facility] exam-11/2019-[facility]  Dietary issues and exercise activities discussed: Current Exercise Habits: Home exercise routine, Type of exercise: calisthenics, Time (Minutes): 60, Frequency (Times/Week): 3, Weekly Exercise (Minutes/Week): 180, Intensity: Mild, Exercise limited by: None identified  Goals    . Patient Stated     Maintain current health by staying active and eating well.       Depression Screen PHQ 2/9 Scores 02/17/2020 02/09/2020 01/08/2019 12/15/2018 05/06/2017 04/30/2016 02/08/2016  PHQ - 2 Score 0 0 0 0 0 0 0    Fall Risk Fall Risk   02/17/2020 02/09/2020 01/08/2019 05/06/2017 04/30/2016  Falls in the past year? 0 0 0 No No  Number falls in past yr: 0 0 0 - -  Injury with Fall? 0 0 0 - -  Follow up Falls prevention discussed - Falls prevention discussed - -    Any stairs in or around the home? Yes  If so, are there any without handrails? No  Home free of loose throw rugs in walkways, pet beds, electrical cords, etc? Yes  Adequate lighting in your home to reduce risk of falls? Yes   ASSISTIVE DEVICES UTILIZED TO PREVENT FALLS:  Life alert? No  Use of a cane, walker or w/c? No  Grab bars in the bathroom? Yes  Shower chair or bench in shower? No  Elevated toilet seat or a handicapped toilet? No   TIMED UP AND GO:  Was the test performed? No . Phone visit   Cognitive Function:No cognitive impairment noted. MMSE - Mini Mental State Exam 04/30/2016  Orientation to time 5  Orientation to Place 5  Registration 3  Attention/ Calculation 5  Recall 3  Language- name 2 objects 2  Language- repeat 1  Language- follow 3 step command 3  Language- read & follow direction 1  Write a sentence 1  Copy design 1  Total score 30        Immunizations Immunization History  Administered Date(s) Administered  . Fluad Quad(high Dose 65+) 12/15/2018, 02/09/2020  . Influenza Split 12/22/2010, 12/21/2011  . Influenza Whole 02/15/2010  . Influenza, High Dose Seasonal PF 02/01/2015, 01/02/2017, 01/28/2018  . Influenza,inj,Quad PF,6+ Mos 12/02/2012, 01/21/2014  . Influenza-Unspecified 01/18/2016  . Pneumococcal Conjugate-13 02/10/2014  . Pneumococcal Polysaccharide-23 12/17/2012  . Td 02/15/2010  . Tdap 03/19/2010  . Zoster 03/20/2007  . Zoster Recombinat (Shingrix) 09/10/2017, 11/15/2017, 11/21/2017    TDAP status: Up to date   Flu Vaccine status: Up to date   Pneumococcal vaccine status: Up to date   Covid-19 vaccine status: Completed vaccines-Per patient. No documentation available. Patient advised to bring to  next office visit.  Qualifies for Shingles Vaccine? No   Zostavax completed Yes   Shingrix Completed?: Yes  Screening Tests Health Maintenance  Topic Date Due  . COVID-19 Vaccine (1) Never done  . TETANUS/TDAP  03/19/2020  . INFLUENZA VACCINE  Completed  . DEXA SCAN  Completed  . PNA vac Low Risk Adult  Completed    Health Maintenance  Health Maintenance Due  Topic Date Due  . COVID-19 Vaccine (1) Never done    Colorectal cancer screening: No longer required.    Mammogram status: No longer required.    Bone density status: Declined  Lung Cancer Screening: (Low Dose CT Chest recommended if Age 78-80 years, 30 pack-year currently smoking OR have quit w/in 15years.) does not qualify.     Additional Screening:  Hepatitis C Screening: does not qualify  Vision Screening: Recommended annual ophthalmology exams for early detection of glaucoma and other disorders of the eye. Is the patient up to date with their annual eye exam?  Yes  Who is the provider or what is the name of the office in which the patient attends annual eye exams? [facility]   Dental Screening: Recommended annual dental exams for proper oral hygiene  Community Resource Referral / Chronic Care Management: CRR required this visit?  No   CCM required this visit?  No      Plan:     I have personally reviewed and noted the following in the patient's chart:   . Medical and social history . Use of alcohol, tobacco or illicit drugs  . Current medications and supplements . Functional ability and status . Nutritional status . Physical activity . Advanced directives . List of other physicians . Hospitalizations, surgeries, and ER visits in previous 12 months . Vitals . Screenings to include  cognitive, depression, and falls . Referrals and appointments  In addition, I have reviewed and discussed with patient certain preventive protocols, quality metrics, and best practice recommendations. A  written personalized care plan for preventive services as well as general preventive health recommendations were provided to patient.   Due to this being a telephonic visit, the after visit summary with patients personalized plan was offered to patient via mail or my-chart.  Per request, copy mailed to patient.   Marta Antu, LPN   [id number]  Nurse Health Advisor  Nurse Notes: None

## 2020-02-17 ENCOUNTER — Ambulatory Visit (INDEPENDENT_AMBULATORY_CARE_PROVIDER_SITE_OTHER): Payer: Medicare Other

## 2020-02-17 VITALS — Ht 60.0 in | Wt 133.0 lb

## 2020-02-17 DIAGNOSIS — Z0000 Encounter for general adult medical examination without abnormal findings: Secondary | ICD-10-CM

## 2020-02-17 NOTE — Patient Instructions (Signed)
Ms. Jennifer Peck , Thank you for taking time to complete your Medicare Wellness Visit. I appreciate your ongoing commitment to your health goals. Please review the following plan we discussed and let me know if I can assist you in the future.   Screening recommendations/referrals: Colonoscopy: No longer required Mammogram: No longer required Bone Density: Declined. Recommended yearly ophthalmology/optometry visit for glaucoma screening and checkup Recommended yearly dental visit for hygiene and checkup  Vaccinations: Influenza vaccine: Up to date Pneumococcal vaccine: Completed vaccines Tdap vaccine: Up to date- Due-03/19/2020 Shingles vaccine: Completed vaccines  Covid-19:Per our conversation, completed vaccines. Please bring documentation to your next office visit.  Advanced directives: Declined to have a copy in chart.  Conditions/risks identified: See problem list  Next appointment: Follow up in one year for your annual wellness visit    Preventive Care 65 Years and Older, Female Preventive care refers to lifestyle choices and visits with your health care provider that can promote health and wellness. What does preventive care include?  A yearly physical exam. This is also called an annual well check.  Dental exams once or twice a year.  Routine eye exams. Ask your health care provider how often you should have your eyes checked.  Personal lifestyle choices, including:  Daily care of your teeth and gums.  Regular physical activity.  Eating a healthy diet.  Avoiding tobacco and drug use.  Limiting alcohol use.  Practicing safe sex.  Taking low-dose aspirin every day.  Taking vitamin and mineral supplements as recommended by your health care provider. What happens during an annual well check? The services and screenings done by your health care provider during your annual well check will depend on your age, overall health, lifestyle risk factors, and family history of  disease. Counseling  Your health care provider may ask you questions about your:  Alcohol use.  Tobacco use.  Drug use.  Emotional well-being.  Home and relationship well-being.  Sexual activity.  Eating habits.  History of falls.  Memory and ability to understand (cognition).  Work and work Statistician.  Reproductive health. Screening  You may have the following tests or measurements:  Height, weight, and BMI.  Blood pressure.  Lipid and cholesterol levels. These may be checked every 5 years, or more frequently if you are over 71 years old.  Skin check.  Lung cancer screening. You may have this screening every year starting at age 50 if you have a 30-pack-year history of smoking and currently smoke or have quit within the past 15 years.  Fecal occult blood test (FOBT) of the stool. You may have this test every year starting at age 56.  Flexible sigmoidoscopy or colonoscopy. You may have a sigmoidoscopy every 5 years or a colonoscopy every 10 years starting at age 19.  Hepatitis C blood test.  Hepatitis B blood test.  Sexually transmitted disease (STD) testing.  Diabetes screening. This is done by checking your blood sugar (glucose) after you have not eaten for a while (fasting). You may have this done every 1-3 years.  Bone density scan. This is done to screen for osteoporosis. You may have this done starting at age 76.  Mammogram. This may be done every 1-2 years. Talk to your health care provider about how often you should have regular mammograms. Talk with your health care provider about your test results, treatment options, and if necessary, the need for more tests. Vaccines  Your health care provider may recommend certain vaccines, such as:  Influenza vaccine.  This is recommended every year.  Tetanus, diphtheria, and acellular pertussis (Tdap, Td) vaccine. You may need a Td booster every 10 years.  Zoster vaccine. You may need this after age  40.  Pneumococcal 13-valent conjugate (PCV13) vaccine. One dose is recommended after age 73.  Pneumococcal polysaccharide (PPSV23) vaccine. One dose is recommended after age 31. Talk to your health care provider about which screenings and vaccines you need and how often you need them. This information is not intended to replace advice given to you by your health care provider. Make sure you discuss any questions you have with your health care provider. Document Released: 04/01/2015 Document Revised: 11/23/2015 Document Reviewed: 01/04/2015 Elsevier Interactive Patient Education  2017 [place] Prevention in the Home Falls can cause injuries. They can happen to people of all ages. There are many things you can do to make your home safe and to help prevent falls. What can I do on the outside of my home?  Regularly fix the edges of walkways and driveways and fix any cracks.  Remove anything that might make you trip as you walk through a door, such as a raised step or threshold.  Trim any bushes or trees on the path to your home.  Use bright outdoor lighting.  Clear any walking paths of anything that might make someone trip, such as rocks or tools.  Regularly check to see if handrails are loose or broken. Make sure that both sides of any steps have handrails.  Any raised decks and porches should have guardrails on the edges.  Have any leaves, snow, or ice cleared regularly.  Use sand or salt on walking paths during winter.  Clean up any spills in your garage right away. This includes oil or grease spills. What can I do in the bathroom?  Use night lights.  Install grab bars by the toilet and in the tub and shower. Do not use towel bars as grab bars.  Use non-skid mats or decals in the tub or shower.  If you need to sit down in the shower, use a plastic, non-slip stool.  Keep the floor dry. Clean up any water that spills on the floor as soon as it happens.  Remove  soap buildup in the tub or shower regularly.  Attach bath mats securely with double-sided non-slip rug tape.  Do not have throw rugs and other things on the floor that can make you trip. What can I do in the bedroom?  Use night lights.  Make sure that you have a light by your bed that is easy to reach.  Do not use any sheets or blankets that are too big for your bed. They should not hang down onto the floor.  Have a firm chair that has side arms. You can use this for support while you get dressed.  Do not have throw rugs and other things on the floor that can make you trip. What can I do in the kitchen?  Clean up any spills right away.  Avoid walking on wet floors.  Keep items that you use a lot in easy-to-reach places.  If you need to reach something above you, use a strong step stool that has a grab bar.  Keep electrical cords out of the way.  Do not use floor polish or wax that makes floors slippery. If you must use wax, use non-skid floor wax.  Do not have throw rugs and other things on the floor that can make  you trip. What can I do with my stairs?  Do not leave any items on the stairs.  Make sure that there are handrails on both sides of the stairs and use them. Fix handrails that are broken or loose. Make sure that handrails are as long as the stairways.  Check any carpeting to make sure that it is firmly attached to the stairs. Fix any carpet that is loose or worn.  Avoid having throw rugs at the top or bottom of the stairs. If you do have throw rugs, attach them to the floor with carpet tape.  Make sure that you have a light switch at the top of the stairs and the bottom of the stairs. If you do not have them, ask someone to add them for you. What else can I do to help prevent falls?  Wear shoes that:  Do not have high heels.  Have rubber bottoms.  Are comfortable and fit you well.  Are closed at the toe. Do not wear sandals.  If you use a  stepladder:  Make sure that it is fully opened. Do not climb a closed stepladder.  Make sure that both sides of the stepladder are locked into place.  Ask someone to hold it for you, if possible.  Clearly mark and make sure that you can see:  Any grab bars or handrails.  First and last steps.  Where the edge of each step is.  Use tools that help you move around (mobility aids) if they are needed. These include:  Canes.  Walkers.  Scooters.  Crutches.  Turn on the lights when you go into a dark area. Replace any light bulbs as soon as they burn out.  Set up your furniture so you have a clear path. Avoid moving your furniture around.  If any of your floors are uneven, fix them.  If there are any pets around you, be aware of where they are.  Review your medicines with your doctor. Some medicines can make you feel dizzy. This can increase your chance of falling. Ask your doctor what other things that you can do to help prevent falls. This information is not intended to replace advice given to you by your health care provider. Make sure you discuss any questions you have with your health care provider. Document Released: 12/30/2008 Document Revised: 08/11/2015 Document Reviewed: 04/09/2014 Elsevier Interactive Patient Education  2017 [employer].

## 2020-04-26 ENCOUNTER — Telehealth: Payer: Self-pay | Admitting: Cardiovascular Disease

## 2020-04-26 NOTE — Progress Notes (Signed)
Cardiology Office Note:    Date:  04/27/2020   ID:  Jennifer Peck, DOB [birth year], MRN [id number]  PCP:  Tammi Sou, MD  Cardiologist:  Skeet Latch, MD  Electrophysiologist:  None   Referring MD: Tammi Sou, MD   Chief Complaint: "elevated BP"   History of Present Illness:    Jennifer Peck is a 87 y.o. female with a history of CAD s/p BMS to LCX, RCA, OM1, and OM2 in 2011; PVCs/PACS; hypertension; hyperlipidemia intolerant to statin and Zetia in the past; chronic venous insufficiency; and vertigo who is followed by Dr. Oval Linsey and presents today for further evaluation of hypertension.  Patient previously followed by [name]. She has known CAD and underwent multiple PCIs with BMS to LCX, RCA, OM1, and OM2 in 2011. At office visit in 10/2018, patient with bilateral neck pain and was concerned that this could be an anginal equivalent. A Myoview was ordered but it does not look like this was ever done. She was last seen by Dr. Oval Linsey in 09/2019 at which time she was doing well and staying active at home with no exertional chest pain or shortness of breath.  Of note, patient has a history of hyperlipidemia. She has been intolerant to statins and Zetia in the past and has not wanted to try a PCSK9 inhibitor. Only on Omega-3. LDL was 146 on last check in 09/2019. Dr. Oval Linsey recommended Nexletol and patient ws going to call us back if she wanted to start it.  Patient called our office on 04/26/2020 with concerns of very elevated BP of 22/117 and 180/82. Therefore, this office visit was arranged. Here with her daughter. Patient states on Saturday she started taking only half of her Toprol-XL pill due to concern that this may be causing her hair thinning and tender scalp (she read an article online that it could cause this). She only took this lower dose of Toprol-XL for one day and then returned to usual dose. She took her other BP medications as prescribed. Then on Tuesday, she  noticed that her BP was markedly elevated at 211/117. She checked her BP because she was just feeling really anxious. No other symptoms just anxious. Daughter states she has some anxiety. Because of this BP reading, she went to her PCP office to see if they could see her. They did not have any availability but advised her to go to an Urgent Care. BP was 180/82 there. They also noticed some "heart beat skip" and advised her that she should be evaluated by her Cardiologist. BP has been improving. Most recent BP checks were 154/95 and then 134/75 this morning. She states BP is usually in the 130's/70's before all this started happening.  She denies any chest pain, shortness of breath, orthopnea, PND, edema. She notes an occasional skipped beat but no prolonged palpitations. She has history of PAC/PVC documented on prior EKGs. No lightheadedness, dizziness, or syncope. No stroke like symptoms including headache, vision changes, slurred speach.   Past Medical History:  Diagnosis Date  . Allergic state 04/30/2011  . Anxiety   . BCC (basal cell carcinoma), face 11/06/2010  . Bilateral bunions 11/06/2010  . Chronic venous insufficiency    lymphedema + venous stasis pigmentation changes  . Constipation 11/06/2010  . Coronary artery disease 2011   BMS x 3  . GERD (gastroesophageal reflux disease) 05/11/2012  . History of hyperkalemia 01/23/2011  . Hyperlipidemia    Intolerant of statins and zetia.  Refused to  consider PCSK9 inhibitor initially but as of 02/2017 cardiol f/u she would consider repatha if approved.  . Hypertension    +white coat component  . Lesion of labia 02/04/2012  . Melanoma in situ [facility]) 08/2016   Derm= Dr. Renda Rolls @ [facility] specialists in [place].  . Osteoarthritis of both knees    R>L  . Osteopenia 11/23/2010; 02/2016   2017 T-score -1.9.  Repeat DEXA 2 yrs.  . Peripheral neuropathy 11/06/2010   Small fiber, non-diabetic per neurologist  . Right lumbar radiculopathy 01/2015    Improved with PT  . SCC (squamous cell carcinoma), arm 11/06/2010  . Seborrheic keratosis 04/30/2011  . Vertigo     Past Surgical History:  Procedure Laterality Date  . CARDIAC CATHETERIZATION  12/20/2009   3 bare metal stents  . DEXA  03/05/2016   T-score -1.9  . EYE SURGERY     cataract- left eye 09-24-11  . SKIN BIOPSY  08/2012   Shave excision of lesion on pt's back: irritated seb keratosis.  . TRANSTHORACIC ECHOCARDIOGRAM  12/05/2009   Mild LVH, normal systolic fxn, mild impaired relaxation.  No signif valvular dz.  . TUBAL LIGATION      Current Medications: Current Meds  Medication Sig  . amLODipine (NORVASC) 2.5 MG tablet Take 1 tablet (2.5 mg total) by mouth daily.  Marland Kitchen aspirin 81 MG tablet Take 81 mg by mouth daily.  Marland Kitchen azelastine (OPTIVAR) 0.05 % ophthalmic solution INSTILL TWO DROPS INTO EACH EYE TWICE DAILY  . Benfotiamine 150 MG CAPS Take 1 capsule by mouth daily. For numbness in feet  . Cyanocobalamin (VITAMIN B-12 CR PO) Take 1 tablet by mouth once a week.   . enalapril (VASOTEC) 20 MG tablet TAKE 1 IN THE MORNING AND 1/2 AT BEDTIME  . fluocinonide (LIDEX) 0.05 % external solution Apply 1 application topically daily as needed.  Marland Kitchen ketoconazole (NIZORAL) 2 % shampoo   . magnesium oxide (MAG-OX) 400 MG tablet Take 400 mg by mouth once a week.  . metoprolol succinate (TOPROL-XL) 25 MG 24 hr tablet Take 1 tablet (25 mg total) by mouth daily.  . Multiple Vitamin (MULTIVITAMIN) tablet Take 1 tablet by mouth once a week. Takes occ  . Naftifine HCl (NAFTIN) 2 % GEL Apply to affected area qd-bid  . Omega-3 Fatty Acids (FISH OIL PO) Take 1 capsule by mouth once a week.     Allergies:   Chlorthalidone, Atenolol, Besivance [besifloxacin hcl], Codeine, Crestor [rosuvastatin], Gabapentin, Lipitor [atorvastatin], Lovastatin, Simvastatin, Sulfa antibiotics, Vigamox [moxifloxacin], and Welchol [colesevelam hcl]   Social History   Socioeconomic History  . Marital status: Single     Spouse name: Not on file  . Number of children: Not on file  . Years of education: Not on file  . Highest education level: Not on file  Occupational History  . Not on file  Tobacco Use  . Smoking status: Former Smoker    Quit date: 09/04/1977    Years since quitting: 42.6  . Smokeless tobacco: Never Used  Vaping Use  . Vaping Use: Never used  Substance and Sexual Activity  . Alcohol use: Yes    Comment: only on Holidays  . Drug use: No  . Sexual activity: Not on file  Other Topics Concern  . Not on file  Social History Narrative   Lives alone near [place].  Two daughters in [place], [place].   Widowed about 30 yrs.   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  Former smoker: approx 30 pack-yr hx, quit 1979.   Occasional social alcohol.   Exercises 3 days a week at [place].   Takes knitting classes and computer classes.   Social Determinants of Health   Financial Resource Strain: Low Risk   . Difficulty of Paying Living Expenses: Not hard at all  Food Insecurity: No Food Insecurity  . Worried About Charity fundraiser in the Last Year: Never true  . Ran Out of Food in the Last Year: Never true  Transportation Needs: No Transportation Needs  . Lack of Transportation (Medical): No  . Lack of Transportation (Non-Medical): No  Physical Activity: Sufficiently Active  . Days of Exercise per Week: 3 days  . Minutes of Exercise per Session: 90 min  Stress: No Stress Concern Present  . Feeling of Stress : Not at all  Social Connections: Socially Isolated  . Frequency of Communication with Friends and Family: More than three times a week  . Frequency of Social Gatherings with Friends and Family: More than three times a week  . Attends Religious Services: Never  . Active Member of Clubs or Organizations: No  . Attends Archivist Meetings: Never  . Marital Status: Widowed     Family History: The patient's family history  includes Asthma in her paternal grandfather; Cancer in her daughter; Heart disease in her brother and father; Hyperlipidemia in her daughter and son; Hypertension in her daughter, daughter, son, son, and son; Stroke in her mother and paternal grandmother; Vision loss in her maternal grandfather.  ROS:   Please see the history of present illness.     EKGs/Labs/Other Studies Reviewed:    The following studies were reviewed today: None  EKG:  EKG ordered today. EKG personally reviewed and demonstrates normal sinus rhythm, rate 70 bpm, with isolated T wave inversion in lead III. Normal axis. PR and QRS interval normal. QTc 438 ms.  Recent Labs: 09/29/2019: ALT 13 02/09/2020: BUN 14; Creatinine, Ser 0.74; Potassium 4.1; Sodium 132  Recent Lipid Panel    Component Value Date/Time   CHOL 234 (H) 09/29/2019 1214   TRIG 95 09/29/2019 1214   HDL 72 09/29/2019 1214   CHOLHDL 3.3 09/29/2019 1214   CHOLHDL 3 01/30/2017 0904   VLDL 19.6 01/30/2017 0904   LDLCALC 146 (H) 09/29/2019 1214   LDLDIRECT 128.0 12/02/2012 0837    Physical Exam:    Vital Signs: BP (!) 154/82   Pulse 85   Ht 5' (1.524 m)   Wt 135 lb 6.4 oz (61.4 kg)   SpO2 96%   BMI 26.44 kg/m     Wt Readings from Last 3 Encounters:  04/27/20 135 lb 6.4 oz (61.4 kg)  02/17/20 133 lb (60.3 kg)  02/09/20 133 lb (60.3 kg)     General: 87 y.o. female in no acute distress. Looks younger than stated age. HEENT: Normocephalic and atraumatic. Sclera clear.  Neck: Supple. No JVD. Heart: RRR with occasional ectopy. Distinct S1 and S2. No murmurs, gallops, or rubs. Radial pulses 2+ and equal bilaterally. Lungs: No increased work of breathing. Clear to ausculation bilaterally. No wheezes, rhonchi, or rales.  Abdomen: Soft, non-distended, and non-tender to palpation.  Extremities: No lower extremity edema.    Skin: Warm and dry. Neuro: Alert and oriented x3. No focal deficits. Psych: Normal affect. Responds  appropriately.   Assessment:    1. Essential hypertension   2. PAC (premature atrial contraction)   3. PVC (premature ventricular contraction)  4. Coronary artery disease involving native coronary artery of native heart without angina pectoris   5. Hyperlipidemia, unspecified hyperlipidemia type     Plan:    Hypertension - BP was recently noted to be markedly elevated as high as 211/117. Patient completely asympomatic with this. However, it has since been slowly improving and this morning at home was back around baseline BP of 130's/70's. Here in the office BP 154/82. - Current medications include: Amlodipine 2.5mg  daily, Enalapril 20mg  in the AM and 10mg  in the PM, and Toprol-XL 25mg  daily. - Given patient's advanced age, I am OK with BP being mildly above goal of 130/80 as I am more concerned about symptoms of hypotension if we drop her BP too low. Given BP has improved on its own, I will continue current medications for now. I have asked patient to keep BP/HR log for 2 weeks and then send [place] this information. I would like to see her BP <150/90. If average BP is above this, will adjust medications.  Patient was concerned that Toprol-XL was causing her hair thinning and scalp tenderness. I do see alopecia listed as possible adverse reaction to Toprol (but rare <1%) on UpToDate. I will talk to Pharmacist about this but will leave on Toprol-XL for now.   PAC/PVCs - Patient was told she had "heart beat skips" at recent Urgent Care visit. I did notice occasional ectopy on exam. Therefore, ordered EKG which showed normal sinus rhythm with no premature beat. She has had PAC/PVCs noted on prior EKGs. Suspect this is what it was. Patient denies any prolonged palpitations. Continue Toprol-XL as above.  CAD - S/p BMS to LCX, RCA, OM1, and OM2 in 2011. - No angina. - Continue aspirin and beta-blocker.  - Intolerant to statins.   Hyperlipidemia - Most recent lipid panel in 09/2019: Total  Cholesterol 234, Triglycerides 95, HDL 72, LDL 146.  - LDL goal <70 given CAD.  - Intolerant to statins and Zetia in the past and has not wanted to try PCSK9 inhibitor. Dr. Oval Linsey recommended Nexletol at time of last lipid check and patient was going to call us back if she decided she wanted tot start this. - Did not have time to discuss this today due to above concerns.  Disposition: Follow up with Dr. Oval Linsey in the Summer as previously directed. Depending on what BP log shows, we may need to see her sooner.   Medication Adjustments/Labs and Tests Ordered: Current medicines are reviewed at length with the patient today.  Concerns regarding medicines are outlined above.  Orders Placed This Encounter  Procedures  . EKG 12-Lead   No orders of the defined types were placed in this encounter.   Patient Instructions  Medication Instructions:  No Changes *If you need a refill on your cardiac medications before your next appointment, please call your pharmacy*   Lab Work: No labs If you have labs (blood work) drawn today and your tests are completely normal, you will receive your results only by: Marland Kitchen MyChart Message (if you have MyChart) OR . A paper copy in the mail If you have any lab test that is abnormal or we need to change your treatment, we will call you to review the results.   Testing/Procedures: No Testing   Follow-Up: At [facility], you and your health needs are our priority.  As part of our continuing mission to provide you with exceptional heart care, we have created designated Provider Care Teams.  These Care Teams include your  primary Cardiologist (physician) and Advanced Practice Providers (APPs -  Physician Assistants and Nurse Practitioners) who all work together to provide you with the care you need, when you need it.    Your next appointment:   6 months  The format for your next appointment:   In Person  Provider:   Skeet Latch, MD   Other  Instructions Take Daily Blood Pressure 2-3 hours after Taking Blood pressure Medication. Log Daily Blood Pressure for 2-3 weeks.    Signed, Darreld Mclean, PA-C  04/27/2020 10:32 PM    [facility]

## 2020-04-26 NOTE — Telephone Encounter (Signed)
Spoke to patient she stated her B/P was been elevated today 211/117,180/82.Stated she went to Urgent Care in [facility] she was told to see Dr.[place].Advised Dr.[place] is not in office today.Her schedule is full 2/9.Appointment scheduled with Sande Rives PA 2/9 at 3:15 pm.Advised to bring B/P readings and all medications to appointment.

## 2020-04-26 NOTE — Telephone Encounter (Signed)
Pt c/o BP issue: STAT if pt c/o blurred vision, one-sided weakness or slurred speech  1. What are your last 5 BP readings?   04/26/20: 211/117      180/82  2. Are you having any other symptoms (ex. Dizziness, headache, blurred vision, passed out)? No   3. What is your BP issue?  BP is extremely elevated today.  Patient states she went to urgent care and she was advised she may need her medication adjusted.

## 2020-04-27 ENCOUNTER — Encounter: Payer: Self-pay | Admitting: Student

## 2020-04-27 ENCOUNTER — Ambulatory Visit (INDEPENDENT_AMBULATORY_CARE_PROVIDER_SITE_OTHER): Payer: Medicare Other | Admitting: Student

## 2020-04-27 ENCOUNTER — Other Ambulatory Visit: Payer: Self-pay

## 2020-04-27 VITALS — BP 154/82 | HR 85 | Ht 60.0 in | Wt 135.4 lb

## 2020-04-27 DIAGNOSIS — I491 Atrial premature depolarization: Secondary | ICD-10-CM | POA: Diagnosis not present

## 2020-04-27 DIAGNOSIS — I2510 Atherosclerotic heart disease of native coronary artery without angina pectoris: Secondary | ICD-10-CM

## 2020-04-27 DIAGNOSIS — E785 Hyperlipidemia, unspecified: Secondary | ICD-10-CM

## 2020-04-27 DIAGNOSIS — I493 Ventricular premature depolarization: Secondary | ICD-10-CM | POA: Diagnosis not present

## 2020-04-27 DIAGNOSIS — I10 Essential (primary) hypertension: Secondary | ICD-10-CM

## 2020-04-27 NOTE — Patient Instructions (Signed)
Medication Instructions:  No Changes *If you need a refill on your cardiac medications before your next appointment, please call your pharmacy*   Lab Work: No labs If you have labs (blood work) drawn today and your tests are completely normal, you will receive your results only by: Marland Kitchen MyChart Message (if you have MyChart) OR . A paper copy in the mail If you have any lab test that is abnormal or we need to change your treatment, we will call you to review the results.   Testing/Procedures: No Testing   Follow-Up: At [facility], you and your health needs are our priority.  As part of our continuing mission to provide you with exceptional heart care, we have created designated Provider Care Teams.  These Care Teams include your primary Cardiologist (physician) and Advanced Practice Providers (APPs -  Physician Assistants and Nurse Practitioners) who all work together to provide you with the care you need, when you need it.    Your next appointment:   6 months  The format for your next appointment:   In Person  Provider:   Skeet Latch, MD   Other Instructions Take Daily Blood Pressure 2-3 hours after Taking Blood pressure Medication. Log Daily Blood Pressure for 2-3 weeks.

## 2020-05-10 ENCOUNTER — Other Ambulatory Visit: Payer: Self-pay

## 2020-05-10 ENCOUNTER — Ambulatory Visit (INDEPENDENT_AMBULATORY_CARE_PROVIDER_SITE_OTHER): Payer: Medicare Other | Admitting: Podiatry

## 2020-05-10 ENCOUNTER — Encounter: Payer: Self-pay | Admitting: Podiatry

## 2020-05-10 DIAGNOSIS — M79674 Pain in right toe(s): Secondary | ICD-10-CM | POA: Diagnosis not present

## 2020-05-10 DIAGNOSIS — G629 Polyneuropathy, unspecified: Secondary | ICD-10-CM

## 2020-05-10 DIAGNOSIS — M79675 Pain in left toe(s): Secondary | ICD-10-CM | POA: Diagnosis not present

## 2020-05-10 DIAGNOSIS — L84 Corns and callosities: Secondary | ICD-10-CM

## 2020-05-10 DIAGNOSIS — B351 Tinea unguium: Secondary | ICD-10-CM | POA: Diagnosis not present

## 2020-05-11 ENCOUNTER — Ambulatory Visit (INDEPENDENT_AMBULATORY_CARE_PROVIDER_SITE_OTHER): Payer: Medicare Other | Admitting: Family Medicine

## 2020-05-11 ENCOUNTER — Encounter: Payer: Self-pay | Admitting: Family Medicine

## 2020-05-11 VITALS — BP 163/82 | HR 78 | Temp 97.9°F | Resp 16 | Ht 60.0 in | Wt 136.0 lb

## 2020-05-11 DIAGNOSIS — L821 Other seborrheic keratosis: Secondary | ICD-10-CM | POA: Diagnosis not present

## 2020-05-11 DIAGNOSIS — I10 Essential (primary) hypertension: Secondary | ICD-10-CM | POA: Diagnosis not present

## 2020-05-11 DIAGNOSIS — I2510 Atherosclerotic heart disease of native coronary artery without angina pectoris: Secondary | ICD-10-CM

## 2020-05-11 NOTE — Patient Instructions (Signed)
Take TWO of the 2.5mg  amlodipine (norvasc) tabs daily. Continue all other meds as currently prescribed.

## 2020-05-11 NOTE — Progress Notes (Signed)
OFFICE VISIT  05/11/2020  CC:  Chief Complaint  Patient presents with  . Moles on back    Painful and black, first noticing 6 days ago. Has an appt scheduled in the summer with Dr.Tafeen at [facility].    HPI:    Patient is a 87 y.o. Caucasian female who presents for skin lesion complaint. Some painful skin when got out of shower 5-6 d/a so she looked at her back in the mirror and was scare by what she saw---lots of dark moly looking spots, so she wanted to get this checked out b/c no appt avail with her derm until this summer.  Also, bp's have been up last few weeks. Saw UC provider for syst >200 one day a few weeks ago, then saw PA with cardiologist's office shortly afterwards.  BPs had been near normal consistently until few weeks ago started getting more consistently in 160s range, occ even higher.  Diastolics usually 47Q.  Pt denies any low bp readings. Has some chronic/unchanged fatigue but no HAs, vision changes, dizziness, nausea, CP, SOB, or palpitations.  Chronic mild LL edema.     Past Medical History:  Diagnosis Date  . Allergic state 04/30/2011  . Anxiety   . BCC (basal cell carcinoma), face 11/06/2010  . Bilateral bunions 11/06/2010  . Chronic venous insufficiency    lymphedema + venous stasis pigmentation changes  . Constipation 11/06/2010  . Coronary artery disease 2011   BMS x 3  . GERD (gastroesophageal reflux disease) 05/11/2012  . History of hyperkalemia 01/23/2011  . Hyperlipidemia    Intolerant of statins and zetia.  Refused to consider PCSK9 inhibitor initially but as of 02/2017 cardiol f/u she would consider repatha if approved.  . Hypertension    +white coat component  . Lesion of labia 02/04/2012  . Melanoma in situ [facility]) 08/2016   Derm= Dr. Renda Rolls @ [facility] specialists in [place].  . Osteoarthritis of both knees    R>L  . Osteopenia 11/23/2010; 02/2016   2017 T-score -1.9.  Repeat DEXA 2 yrs.  . Peripheral neuropathy 11/06/2010   Small  fiber, non-diabetic per neurologist  . Right lumbar radiculopathy 01/2015   Improved with PT  . SCC (squamous cell carcinoma), arm 11/06/2010  . Seborrheic keratosis 04/30/2011  . Vertigo     Past Surgical History:  Procedure Laterality Date  . CARDIAC CATHETERIZATION  12/20/2009   3 bare metal stents  . DEXA  03/05/2016   T-score -1.9  . EYE SURGERY     cataract- left eye 09-24-11  . SKIN BIOPSY  08/2012   Shave excision of lesion on pt's back: irritated seb keratosis.  . TRANSTHORACIC ECHOCARDIOGRAM  12/05/2009   Mild LVH, normal systolic fxn, mild impaired relaxation.  No signif valvular dz.  . TUBAL LIGATION      Outpatient Medications Prior to Visit  Medication Sig Dispense Refill  . amLODipine (NORVASC) 2.5 MG tablet Take 1 tablet (2.5 mg total) by mouth daily. 90 tablet 3  . aspirin 81 MG tablet Take 81 mg by mouth daily.    Marland Kitchen azelastine (OPTIVAR) 0.05 % ophthalmic solution INSTILL TWO DROPS INTO EACH EYE TWICE DAILY 6 mL 12  . Benfotiamine 150 MG CAPS Take 1 capsule by mouth daily. For numbness in feet    . Cyanocobalamin (VITAMIN B-12 CR PO) Take 1 tablet by mouth once a week.     . enalapril (VASOTEC) 20 MG tablet TAKE 1 IN THE MORNING AND 1/2 AT BEDTIME 135 tablet  3  . fluocinonide (LIDEX) 0.05 % external solution Apply 1 application topically daily as needed.    Marland Kitchen ketoconazole (NIZORAL) 2 % shampoo     . magnesium oxide (MAG-OX) 400 MG tablet Take 400 mg by mouth once a week.    . metoprolol succinate (TOPROL-XL) 25 MG 24 hr tablet Take 1 tablet (25 mg total) by mouth daily. 90 tablet 3  . Multiple Vitamin (MULTIVITAMIN) tablet Take 1 tablet by mouth once a week. Takes occ    . Naftifine HCl (NAFTIN) 2 % GEL Apply to affected area qd-bid 60 g 3  . Omega-3 Fatty Acids (FISH OIL PO) Take 1 capsule by mouth once a week.     No facility-administered medications prior to visit.    Allergies  Allergen Reactions  . Chlorthalidone     hyponatremia  . Atenolol     Caused  fatigue   . Besivance [Besifloxacin Hcl]     rash  . Codeine Nausea And Vomiting  . Crestor [Rosuvastatin]     "leg gave away"  . Gabapentin     Memory loss and confusion  . Lipitor [Atorvastatin] Other (See Comments)    Severe leg pain  . Lovastatin     myalgia  . Simvastatin     Her leg gave away on her and states thinks secondary to Simvastatin  . Sulfa Antibiotics Nausea Only  . Vigamox [Moxifloxacin]     rash  . Welchol [Colesevelam Hcl]     ROS As per HPI  PE: Vitals with BMI 05/11/2020 04/27/2020 02/17/2020  Height 5\' 0"  5\' 0"  5\' 0"   Weight 136 lbs 135 lbs 6 oz 133 lbs  BMI 26.56 16.10 96.04  Systolic 540 981 -  Diastolic 82 82 -  Pulse 78 85 -   Gen: Alert, well appearing.  Patient is oriented to person, place, time, and situation. AFFECT: pleasant, lucid thought and speech. CV: RRR with occ ectopy, no m/r/g.   LUNGS: CTA bilat, nonlabored resps, good aeration in all lung fields. EXT: no clubbing or cyanosis.  Trace bilat LL pitting edema, R a bit more than L. SKIN: her back has many scattered lentiginous lesions, freckling, seb derm lesions, occ benign-appearing nevus.  No inflammation/erythema.  No vesicles or pustules.  No ulcerations or excoriations.    LABS:    Chemistry      Component Value Date/Time   NA 132 (L) 02/09/2020 1211   NA 138 09/29/2019 1214   K 4.1 02/09/2020 1211   CL 97 02/09/2020 1211   CO2 27 02/09/2020 1211   BUN 14 02/09/2020 1211   BUN 13 09/29/2019 1214   CREATININE 0.74 02/09/2020 1211   CREATININE 0.78 06/06/2010 1745      Component Value Date/Time   CALCIUM 9.8 02/09/2020 1211   ALKPHOS 65 09/29/2019 1214   AST 8 09/29/2019 1214   ALT 13 09/29/2019 1214   BILITOT 0.5 09/29/2019 1214     IMPRESSION AND PLAN:  1) Benign skin lesions on back: seb keratoses is what alarmed her-> reassured.  2) Uncontrolled HTN: inc amlodipine to 5mg  qd (TWO 2.5mg  tabs qd, she has MANY of these tabs currently, so I won't send in new rx at  this time). Cont toprol xl 25mg  qd and enalapril 20mg  qAM and 10mg  qPM. Cont daily bp and hr monitoring and we'll review these at f/u in 2 wks in office.  An After Visit Summary was printed and given to the patient.  FOLLOW UP: Return in  about 2 weeks (around 05/25/2020) for f/u HTN.  Signed:  Crissie Sickles, MD           05/11/2020

## 2020-05-14 NOTE — Progress Notes (Signed)
Subjective: Jennifer Peck is a pleasant 87 y.o. female patient seen today at risk foot care with history of peripheral neuropathy and painful corn(s) interdigitally lateral left 2nd toe and distal tip right 2nd digit and painful thick toenails that are difficult to trim. Painful toenails interfere with ambulation. Aggravating factors include wearing enclosed shoe gear. Pain is relieved with periodic professional debridement. Painful corns are aggravated when weightbearing when wearing enclosed shoe gear. Pain is relieved with periodic professional debridement.   Patient's daughter is present during thei visit on today.  Allergies  Allergen Reactions  . Chlorthalidone     hyponatremia  . Atenolol     Caused fatigue   . Besivance [Besifloxacin Hcl]     rash  . Codeine Nausea And Vomiting  . Crestor [Rosuvastatin]     "leg gave away"  . Gabapentin     Memory loss and confusion  . Lipitor [Atorvastatin] Other (See Comments)    Severe leg pain  . Lovastatin     myalgia  . Simvastatin     Her leg gave away on her and states thinks secondary to Simvastatin  . Sulfa Antibiotics Nausea Only  . Vigamox [Moxifloxacin]     rash  . Welchol [Colesevelam Hcl]     Objective: Physical Exam  General: Jennifer Peck is a pleasant 87 y.o. Caucasian female, in NAD. AAO x 3.   Vascular:  Neurovascular status unchanged b/l lower extremities. Capillary refill time to digits immediate b/l. Palpable DP pulses b/l. Palpable PT pulses b/l. Pedal hair sparse b/l. Skin temperature gradient within normal limits b/l.  Dermatological:  Pedal skin with normal turgor, texture and tone bilaterally. No open wounds bilaterally. No interdigital macerations bilaterally. Toenails 1-5 b/l elongated, discolored, dystrophic, thickened, crumbly with subungual debris and tenderness to dorsal palpation. Hyperkeratotic lesion(s) L 2nd toe, R 3rd toe and submet head 5 right foot.  No erythema, no edema, no drainage, no  fluctuance.  Musculoskeletal:  Normal muscle strength 5/5 to all lower extremity muscle groups bilaterally. No pain crepitus or joint limitation noted with ROM b/l. Hallux valgus with bunion deformity noted b/l lower extremities. Left bunion is noted to possess blanchable erythema. No warmth, no flocculence. Hammertoes noted to the L 2nd toe, L 3rd toe, L 4th toe, R 2nd toe, R 3rd toe and R 4th toe.  Neurological:  Protective sensation diminished with 10g monofilament b/l.  Assessment and Plan:  1. Pain due to onychomycosis of toenails of both feet   2. Corns and callosities   3. Neuropathy     -Examined patient. -Toenails 1-5 b/l were debrided in length and girth with sterile nail nippers and dremel without iatrogenic bleeding.  -Corn(s) left 2nd toe, distal right 3rd digit pared utilizing sterile scalpel blade without complication or incident. Total number debrided=2. -Calluses pared submetatarsal head(s) 5 right foot utilizing sterile scalpel blade without incident.Total number debrided=1. -Continue toe separators for interdigital corns. -Patient to report any pedal injuries to medical professional immediately. -Patient/POA to call should there be question/concern in the interim.  Return in about 3 months (around 08/07/2020).  Marzetta Board, DPM

## 2020-05-30 ENCOUNTER — Encounter: Payer: Self-pay | Admitting: Family Medicine

## 2020-05-30 ENCOUNTER — Other Ambulatory Visit: Payer: Self-pay

## 2020-05-30 ENCOUNTER — Ambulatory Visit (INDEPENDENT_AMBULATORY_CARE_PROVIDER_SITE_OTHER): Payer: Medicare Other | Admitting: Family Medicine

## 2020-05-30 VITALS — BP 130/80 | HR 82 | Temp 97.7°F | Resp 16 | Ht 60.0 in | Wt 137.0 lb

## 2020-05-30 DIAGNOSIS — I10 Essential (primary) hypertension: Secondary | ICD-10-CM | POA: Diagnosis not present

## 2020-05-30 DIAGNOSIS — I2510 Atherosclerotic heart disease of native coronary artery without angina pectoris: Secondary | ICD-10-CM

## 2020-05-30 MED ORDER — AMLODIPINE BESYLATE 5 MG PO TABS: 5.0000 mg | ORAL_TABLET | Freq: Every day | ORAL | 3 refills | Status: DC

## 2020-05-30 NOTE — Progress Notes (Signed)
OFFICE VISIT  05/30/2020  CC:  Chief Complaint  Patient presents with  . Follow-up    Hypertension, pt is not fasting   HPI:    Patient is a 87 y.o. Caucasian female who presents for 2 week f/u uncontrolled HTN. A/P as of last visit: "1) Benign skin lesions on back: seb keratoses is what alarmed her-> reassured.  2) Uncontrolled HTN: inc amlodipine to 5mg  qd (TWO 2.5mg  tabs qd, she has MANY of these tabs currently, so I won't send in new rx at this time). Cont toprol xl 25mg  qd and enalapril 20mg  qAM and 10mg  qPM. Cont daily bp and hr monitoring and we'll review these at f/u in 2 wks in office"  INTERIM HX: She's feeling well. Taking meds as rx'd. Home bp's since last visit avg 150s/80s but a fair # of readings in 130s and 70s,  HR 60s. Her bp cuff was up compared to our manual today (157/91 comp to 130/80).     Past Medical History:  Diagnosis Date  . Allergic state 04/30/2011  . Anxiety   . BCC (basal cell carcinoma), face 11/06/2010  . Bilateral bunions 11/06/2010  . Chronic venous insufficiency    lymphedema + venous stasis pigmentation changes  . Constipation 11/06/2010  . Coronary artery disease 2011   BMS x 3  . GERD (gastroesophageal reflux disease) 05/11/2012  . History of hyperkalemia 01/23/2011  . Hyperlipidemia    Intolerant of statins and zetia.  Refused to consider PCSK9 inhibitor initially but as of 02/2017 cardiol f/u she would consider repatha if approved.  . Hypertension    +white coat component  . Lesion of labia 02/04/2012  . Melanoma in situ [facility]) 08/2016   Derm= Dr. Renda Rolls @ [facility] specialists in [place].  . Osteoarthritis of both knees    R>L  . Osteopenia 11/23/2010; 02/2016   2017 T-score -1.9.  Repeat DEXA 2 yrs.  . Peripheral neuropathy 11/06/2010   Small fiber, non-diabetic per neurologist  . Right lumbar radiculopathy 01/2015   Improved with PT  . SCC (squamous cell carcinoma), arm 11/06/2010  . Seborrheic keratosis 04/30/2011  .  Vertigo     Past Surgical History:  Procedure Laterality Date  . CARDIAC CATHETERIZATION  12/20/2009   3 bare metal stents  . DEXA  03/05/2016   T-score -1.9  . EYE SURGERY     cataract- left eye 09-24-11  . SKIN BIOPSY  08/2012   Shave excision of lesion on pt's back: irritated seb keratosis.  . TRANSTHORACIC ECHOCARDIOGRAM  12/05/2009   Mild LVH, normal systolic fxn, mild impaired relaxation.  No signif valvular dz.  . TUBAL LIGATION      Outpatient Medications Prior to Visit  Medication Sig Dispense Refill  . aspirin 81 MG tablet Take 81 mg by mouth daily.    Marland Kitchen azelastine (OPTIVAR) 0.05 % ophthalmic solution INSTILL TWO DROPS INTO EACH EYE TWICE DAILY 6 mL 12  . B Complex Vitamins (B COMPLEX-B12 PO) Take by mouth once a week.    . Coenzyme Q10 (COQ10 PO) Take by mouth once a week.    . enalapril (VASOTEC) 20 MG tablet TAKE 1 IN THE MORNING AND 1/2 AT BEDTIME 135 tablet 3  . fluocinonide (LIDEX) 0.05 % external solution Apply 1 application topically daily as needed.    Marland Kitchen ketoconazole (NIZORAL) 2 % shampoo     . magnesium oxide (MAG-OX) 400 MG tablet Take 400 mg by mouth once a week.    . metoprolol succinate (  TOPROL-XL) 25 MG 24 hr tablet Take 1 tablet (25 mg total) by mouth daily. 90 tablet 3  . Multiple Vitamin (MULTIVITAMIN) tablet Take 1 tablet by mouth once a week. Takes occ    . Naftifine HCl (NAFTIN) 2 % GEL Apply to affected area qd-bid 60 g 3  . Omega-3 Fatty Acids (FISH OIL PO) Take 1 capsule by mouth once a week.    Marland Kitchen amLODipine (NORVASC) 2.5 MG tablet Take 1 tablet (2.5 mg total) by mouth daily. 90 tablet 3  . Cyanocobalamin (VITAMIN B-12 CR PO) Take 1 tablet by mouth once a week.     . Benfotiamine 150 MG CAPS Take 1 capsule by mouth daily. For numbness in feet (Patient not taking: Reported on 05/30/2020)     No facility-administered medications prior to visit.    Allergies  Allergen Reactions  . Chlorthalidone     hyponatremia  . Atenolol     Caused fatigue    . Besivance [Besifloxacin Hcl]     rash  . Codeine Nausea And Vomiting  . Crestor [Rosuvastatin]     "leg gave away"  . Gabapentin     Memory loss and confusion  . Lipitor [Atorvastatin] Other (See Comments)    Severe leg pain  . Lovastatin     myalgia  . Simvastatin     Her leg gave away on her and states thinks secondary to Simvastatin  . Sulfa Antibiotics Nausea Only  . Vigamox [Moxifloxacin]     rash  . Welchol [Colesevelam Hcl]     ROS As per HPI  PE: Vitals with BMI 05/30/2020 05/11/2020 04/27/2020  Height 5\' 0"  5\' 0"  5\' 0"   Weight 137 lbs 136 lbs 135 lbs 6 oz  BMI 26.76 84.66 59.93  Systolic 570 177 939  Diastolic 80 82 82  Pulse 82 78 85     Gen: Alert, well appearing.  Patient is oriented to person, place, time, and situation. AFFECT: pleasant, lucid thought and speech. CV: Regularly irregular->4 regular beats followed by premature beat and then a brief compensatory pause.  Just a trace of systolic ejection murmur.  No r/g.   LUNGS: CTA bilat, nonlabored resps, good aeration in all lung fields. EXT: no clubbing or cyanosis.  no edema.    LABS:    Chemistry      Component Value Date/Time   NA 132 (L) 02/09/2020 1211   NA 138 09/29/2019 1214   K 4.1 02/09/2020 1211   CL 97 02/09/2020 1211   CO2 27 02/09/2020 1211   BUN 14 02/09/2020 1211   BUN 13 09/29/2019 1214   CREATININE 0.74 02/09/2020 1211   CREATININE 0.78 06/06/2010 1745      Component Value Date/Time   CALCIUM 9.8 02/09/2020 1211   ALKPHOS 65 09/29/2019 1214   AST 8 09/29/2019 1214   ALT 13 09/29/2019 1214   BILITOT 0.5 09/29/2019 1214      IMPRESSION AND PLAN:  HTN, well controlled per our in-office manual bp cuff monitoring.  She can d/c home bp monitoring. Cont amlod 5mg  qd, toprol xl 25mg  qd, and enalapril 20mg  qAM and 10mg  qPM. BMET at next o/v in about 2 mo.  Nurse visit for bp check in about 2 wks.   An After Visit Summary was printed and given to the patient.  FOLLOW UP:  Return in about 2 months (around 07/30/2020) for nurse visit for bp and hr check in 2 wks; keep o/v with me set for May.  Signed:  Crissie Sickles, MD           05/30/2020

## 2020-06-13 ENCOUNTER — Other Ambulatory Visit: Payer: Self-pay

## 2020-06-13 ENCOUNTER — Ambulatory Visit (INDEPENDENT_AMBULATORY_CARE_PROVIDER_SITE_OTHER): Payer: Medicare Other

## 2020-06-13 VITALS — BP 140/76 | HR 74

## 2020-06-13 DIAGNOSIS — I10 Essential (primary) hypertension: Secondary | ICD-10-CM | POA: Diagnosis not present

## 2020-06-13 NOTE — Progress Notes (Signed)
Pt here for Blood pressure check per [place]  Pt currently takes: amlodipine 5mg    Pt reports compliance with medication.  BP today @ =140/76 HR =74  Pt advised per [place]

## 2020-07-19 ENCOUNTER — Ambulatory Visit (INDEPENDENT_AMBULATORY_CARE_PROVIDER_SITE_OTHER): Payer: Medicare Other | Admitting: Dermatology

## 2020-07-19 ENCOUNTER — Other Ambulatory Visit: Payer: Self-pay

## 2020-07-19 DIAGNOSIS — L570 Actinic keratosis: Secondary | ICD-10-CM | POA: Diagnosis not present

## 2020-07-19 DIAGNOSIS — Z1283 Encounter for screening for malignant neoplasm of skin: Secondary | ICD-10-CM | POA: Diagnosis not present

## 2020-07-19 DIAGNOSIS — I2510 Atherosclerotic heart disease of native coronary artery without angina pectoris: Secondary | ICD-10-CM

## 2020-07-19 DIAGNOSIS — L821 Other seborrheic keratosis: Secondary | ICD-10-CM | POA: Diagnosis not present

## 2020-07-19 NOTE — Patient Instructions (Signed)
Scalpicin [employer]

## 2020-07-31 ENCOUNTER — Encounter: Payer: Self-pay | Admitting: Dermatology

## 2020-07-31 NOTE — Progress Notes (Signed)
   Follow-Up Visit   Subjective  Jennifer Peck is a 87 y.o. female who presents for the following: Annual Exam (Here for full body skin exam. Concerns patient has a lot of places on back that are very itchy. ).  General skin examination Location:  Duration:  Quality:  Associated Signs/Symptoms: Modifying Factors:  Severity:  Timing: Context:   Objective  Well appearing patient in no apparent distress; mood and affect are within normal limits. Objective  Right Lower Back: Multiple 4 to 6 mm textured brown papules.  Clinically none of these are significantly inflamed so perhaps the itching represents notalgia.  Objective  Right Forearm - Anterior, Right Lower Leg - Anterior: Multiple mostly small gritty pink crusts  Objective  Head to Toe: No signs of atypical moles, melanoma or non mole skin cancer    All sun exposed areas plus back examined.   Assessment & Plan    Seborrheic keratosis Right Lower Back  Benign okay to leave.  May try an over-the-counter anti-itch lotion containing the ingredient pramoxine.  AK (actinic keratosis) (2) Right Forearm - Anterior; Right Lower Leg - Anterior  Historically all are stable and nonbothersome; intervention deferred.  Skin exam for malignant neoplasm Head to Toe  Yearly skin check      I, Lavonna Monarch, MD, have reviewed all documentation for this visit.  The documentation on 07/31/20 for the exam, diagnosis, procedures, and orders are all accurate and complete.

## 2020-08-09 ENCOUNTER — Other Ambulatory Visit: Payer: Self-pay

## 2020-08-09 ENCOUNTER — Encounter: Payer: Self-pay | Admitting: Family Medicine

## 2020-08-09 ENCOUNTER — Ambulatory Visit (INDEPENDENT_AMBULATORY_CARE_PROVIDER_SITE_OTHER): Payer: Medicare Other | Admitting: Family Medicine

## 2020-08-09 VITALS — BP 166/72 | HR 60 | Temp 97.7°F | Ht 60.0 in | Wt 135.6 lb

## 2020-08-09 DIAGNOSIS — I872 Venous insufficiency (chronic) (peripheral): Secondary | ICD-10-CM | POA: Diagnosis not present

## 2020-08-09 DIAGNOSIS — E7800 Pure hypercholesterolemia, unspecified: Secondary | ICD-10-CM

## 2020-08-09 DIAGNOSIS — I2510 Atherosclerotic heart disease of native coronary artery without angina pectoris: Secondary | ICD-10-CM | POA: Diagnosis not present

## 2020-08-09 DIAGNOSIS — I10 Essential (primary) hypertension: Secondary | ICD-10-CM

## 2020-08-09 DIAGNOSIS — R202 Paresthesia of skin: Secondary | ICD-10-CM

## 2020-08-09 DIAGNOSIS — R5382 Chronic fatigue, unspecified: Secondary | ICD-10-CM

## 2020-08-09 DIAGNOSIS — Z23 Encounter for immunization: Secondary | ICD-10-CM

## 2020-08-09 MED ORDER — TETANUS-DIPHTH-ACELL PERTUSSIS 5-2.5-18.5 LF-MCG/0.5 IM SUSP: 0.5000 mL | Freq: Once | INTRAMUSCULAR | 0 refills | Status: AC

## 2020-08-09 NOTE — Addendum Note (Signed)
Addended by: Deveron Furlong D on: 08/09/2020 09:56 AM   Modules accepted: Orders

## 2020-08-09 NOTE — Progress Notes (Signed)
OFFICE VISIT  08/09/2020  CC:  Chief Complaint  Patient presents with  . Follow-up  CC f/u htn, venous insuff edema, HLD, cad  HPI:    Patient is a 87 y.o. Caucasian female who presents for f/u HTN, LE venous insufficiency edema, CAD. Has hx of HLD ->statin and zetia intol. I last saw her 05/30/20. A/P as of that visit: "HTN, well controlled per our in-office manual bp cuff monitoring.  She can d/c home bp monitoring. Cont amlod 5mg  qd, toprol xl 25mg  qd, and enalapril 20mg  qAM and 10mg  qPM. BMET at next o/v in about 2 mo.  Nurse visit for bp check in about 2 wks.  "  INTERIM HX: Feeling fine, anxious as usual. "hybernating" b/c of fear of covid. Exercising 1 hr 3 d/week.  She's worried her diet is "not that good" last couple years.  She's worried about vit B12 def. Says still pretty tired all the time.  Appetite good.  Gets 8h sleep nightly. Question of mild chronic numbness/tingling in fingers and toes.  No known hx of b12 def.  HTN: she has always had a white coat component to her HTN.  Historically her home bp monitoring has been suspect simply b/c of bp cuff inaccuracy. She stopped bp checks at home--as per our plan.  HLD: statin and zetia intol, pt declined PCSK9-I, cardiology recommended nexletol and pt was to contact them if she wanted to start this. She continues to decline any chol lowering med.  LE swelling: not c/o swelling lately, just bruising diffusely anteriorly.  ROS as above, plus--> no fevers, no CP, no SOB, no wheezing, no cough, no dizziness, no HAs, no rashes, no melena/hematochezia.  No polyuria or polydipsia.  No myalgias or arthralgias.  No focal weakness, paresthesias, or tremors.  No acute vision or hearing abnormalities.  No dysuria or unusual/new urinary urgency or frequency.  No recent changes in lower legs. No n/v/d or abd pain.  No palpitations.    Past Medical History:  Diagnosis Date  . Allergic state 04/30/2011  . Anxiety   . BCC (basal  cell carcinoma), face 11/06/2010  . Bilateral bunions 11/06/2010  . Chronic venous insufficiency    lymphedema + venous stasis pigmentation changes  . Constipation 11/06/2010  . Coronary artery disease 2011   BMS x 3  . GERD (gastroesophageal reflux disease) 05/11/2012  . History of hyperkalemia 01/23/2011  . Hyperlipidemia    Intolerant of statins and zetia.  Refused to consider PCSK9 inhibitor initially but as of 02/2017 cardiol f/u she would consider repatha if approved.  . Hypertension    +white coat component  . Lesion of labia 02/04/2012  . Melanoma in situ [facility]) 08/2016   Derm= Dr. Renda Rolls @ [facility] specialists in [place].  . Osteoarthritis of both knees    R>L  . Osteopenia 11/23/2010; 02/2016   2017 T-score -1.9.  Repeat DEXA 2 yrs.  . Peripheral neuropathy 11/06/2010   Small fiber, non-diabetic per neurologist  . Right lumbar radiculopathy 01/2015   Improved with PT  . SCC (squamous cell carcinoma), arm 11/06/2010  . Seborrheic keratosis 04/30/2011  . Vertigo     Past Surgical History:  Procedure Laterality Date  . CARDIAC CATHETERIZATION  12/20/2009   3 bare metal stents  . DEXA  03/05/2016   T-score -1.9  . EYE SURGERY     cataract- left eye 09-24-11  . SKIN BIOPSY  08/2012   Shave excision of lesion on pt's back: irritated seb keratosis.  Marland Kitchen  TRANSTHORACIC ECHOCARDIOGRAM  12/05/2009   Mild LVH, normal systolic fxn, mild impaired relaxation.  No signif valvular dz.  . TUBAL LIGATION      Outpatient Medications Prior to Visit  Medication Sig Dispense Refill  . amLODipine (NORVASC) 5 MG tablet Take 1 tablet (5 mg total) by mouth daily. 90 tablet 3  . aspirin 81 MG tablet Take 81 mg by mouth daily.    Marland Kitchen azelastine (OPTIVAR) 0.05 % ophthalmic solution INSTILL TWO DROPS INTO EACH EYE TWICE DAILY 6 mL 12  . B Complex Vitamins (B COMPLEX-B12 PO) Take by mouth once a week.    . Coenzyme Q10 (COQ10 PO) Take by mouth once a week.    . enalapril (VASOTEC) 20 MG tablet TAKE 1  IN THE MORNING AND 1/2 AT BEDTIME 135 tablet 3  . fluocinonide (LIDEX) 0.05 % external solution Apply 1 application topically daily as needed.    Marland Kitchen ketoconazole (NIZORAL) 2 % shampoo     . magnesium oxide (MAG-OX) 400 MG tablet Take 400 mg by mouth once a week.    . metoprolol succinate (TOPROL-XL) 25 MG 24 hr tablet Take 1 tablet (25 mg total) by mouth daily. 90 tablet 3  . Multiple Vitamin (MULTIVITAMIN) tablet Take 1 tablet by mouth once a week. Takes occ    . Naftifine HCl (NAFTIN) 2 % GEL Apply to affected area qd-bid 60 g 3  . Omega-3 Fatty Acids (FISH OIL PO) Take 1 capsule by mouth once a week.     No facility-administered medications prior to visit.    Allergies  Allergen Reactions  . Chlorthalidone     hyponatremia  . Atenolol     Caused fatigue   . Besivance [Besifloxacin Hcl]     rash  . Codeine Nausea And Vomiting  . Crestor [Rosuvastatin]     "leg gave away"  . Gabapentin     Memory loss and confusion  . Lipitor [Atorvastatin] Other (See Comments)    Severe leg pain  . Lovastatin     myalgia  . Simvastatin     Her leg gave away on her and states thinks secondary to Simvastatin  . Sulfa Antibiotics Nausea Only  . Vigamox [Moxifloxacin]     rash  . Welchol [Colesevelam Hcl]     ROS As per HPI  PE: Vitals with BMI 08/09/2020 06/13/2020 05/30/2020  Height 5\' 0"  - 5\' 0"   Weight 135 lbs 10 oz - 137 lbs  BMI 77.93 - 90.30  Systolic 092 330 076  Diastolic 72 76 80  Pulse 60 74 82   Gen: Alert, well appearing.  Patient is oriented to person, place, time, and situation. AFFECT: pleasant, lucid thought and speech. CV: RRR with occ ectopic beat, no m/r/g.   LUNGS: CTA bilat, nonlabored resps, good aeration in all lung fields. EXT: no clubbing or cyanosis.  no edema.  Scattered freckling and mild scatted ecchymoses on pretibial regions bilat.   LABS:    Chemistry      Component Value Date/Time   NA 132 (L) 02/09/2020 1211   NA 138 09/29/2019 1214   K 4.1  02/09/2020 1211   CL 97 02/09/2020 1211   CO2 27 02/09/2020 1211   BUN 14 02/09/2020 1211   BUN 13 09/29/2019 1214   CREATININE 0.74 02/09/2020 1211   CREATININE 0.78 06/06/2010 1745      Component Value Date/Time   CALCIUM 9.8 02/09/2020 1211   ALKPHOS 65 09/29/2019 1214   AST 8 09/29/2019  1214   ALT 13 09/29/2019 1214   BILITOT 0.5 09/29/2019 1214     Lab Results  Component Value Date   WBC 6.0 10/23/2017   HGB 14.8 10/23/2017   HCT 44.2 10/23/2017   MCV 95.0 10/23/2017   PLT 177.0 10/23/2017   Lab Results  Component Value Date   CHOL 234 (H) 09/29/2019   HDL 72 09/29/2019   LDLCALC 146 (H) 09/29/2019   LDLDIRECT 128.0 12/02/2012   TRIG 95 09/29/2019   CHOLHDL 3.3 09/29/2019   Lab Results  Component Value Date   TSH 1.36 05/07/2012   Lab Results  Component Value Date   HGBA1C  09/03/2009    5.4 (NOTE)                                                                       According to the ADA Clinical Practice Recommendations for 2011, when HbA1c is used as a screening test:   >=6.5%   Diagnostic of Diabetes Mellitus           (if abnormal result  is confirmed)  5.7-6.4%   Increased risk of developing Diabetes Mellitus  References:Diagnosis and Classification of Diabetes Mellitus,Diabetes POEU,2353,61(WERXV 1):S62-S69 and Standards of Medical Care in         Diabetes - 2011,Diabetes Care,2011,34  (Suppl 1):S11-S61.   Lab Results  Component Value Date   WBC 6.0 10/23/2017   HGB 14.8 10/23/2017   HCT 44.2 10/23/2017   MCV 95.0 10/23/2017   PLT 177.0 10/23/2017    IMPRESSION AND PLAN:  1) HTN; signif white coat component historically and presently.  No home monitoring b/c this only makes her more anxious. No change in meds. Lytes/cr check today.  2) HLD: hx of intol of statins and zetia. Nexletol recommended by cardiology but pt declines.  3) LE venous insufficiency edema: no edema lately. Low Na diet, elevate legs.  4) CAD: asymptomatic.  Cont ASA and  BB. See #2 regarding statin.  5) Chronic fatigue: no red flags for concerning etiology but will check TSH and cbc as well as vit B12 level (hx of paresthesias fingers and toes--pt not clear on this today but pt with hx of dx of peripheral neuropathy by neurologist in 2012).  6) Preventative health: Due for Tdap booster->rx to pharmacy today.  An After Visit Summary was printed and given to the patient.  FOLLOW UP: No follow-ups on file.  Signed:  Crissie Sickles, MD           08/09/2020

## 2020-08-17 ENCOUNTER — Other Ambulatory Visit: Payer: Self-pay

## 2020-08-17 ENCOUNTER — Ambulatory Visit (INDEPENDENT_AMBULATORY_CARE_PROVIDER_SITE_OTHER): Payer: Medicare Other

## 2020-08-17 DIAGNOSIS — E7800 Pure hypercholesterolemia, unspecified: Secondary | ICD-10-CM

## 2020-08-17 DIAGNOSIS — I2510 Atherosclerotic heart disease of native coronary artery without angina pectoris: Secondary | ICD-10-CM

## 2020-08-17 DIAGNOSIS — I872 Venous insufficiency (chronic) (peripheral): Secondary | ICD-10-CM

## 2020-08-17 DIAGNOSIS — R5382 Chronic fatigue, unspecified: Secondary | ICD-10-CM

## 2020-08-17 DIAGNOSIS — I10 Essential (primary) hypertension: Secondary | ICD-10-CM

## 2020-08-17 DIAGNOSIS — R202 Paresthesia of skin: Secondary | ICD-10-CM

## 2020-08-17 NOTE — Addendum Note (Signed)
Addended by: Octaviano Glow on: 08/17/2020 10:37 AM   Modules accepted: Orders

## 2020-08-18 ENCOUNTER — Telehealth: Payer: Self-pay

## 2020-08-18 ENCOUNTER — Other Ambulatory Visit: Payer: Self-pay

## 2020-08-18 DIAGNOSIS — Z23 Encounter for immunization: Secondary | ICD-10-CM

## 2020-08-18 LAB — CBC WITH DIFFERENTIAL/PLATELET
Absolute Monocytes: 559 cells/uL (ref 200–950)
Basophils Absolute: 29 cells/uL (ref 0–200)
Basophils Relative: 0.5 %
Eosinophils Absolute: 57 cells/uL (ref 15–500)
Eosinophils Relative: 1 %
HCT: 45 % (ref 35.0–45.0)
Hemoglobin: 14.8 g/dL (ref 11.7–15.5)
Lymphs Abs: 2320 cells/uL (ref 850–3900)
MCH: 31.8 pg (ref 27.0–33.0)
MCHC: 32.9 g/dL (ref 32.0–36.0)
MCV: 96.6 fL (ref 80.0–100.0)
MPV: 11.4 fL (ref 7.5–12.5)
Monocytes Relative: 9.8 %
Neutro Abs: 2736 cells/uL (ref 1500–7800)
Neutrophils Relative %: 48 %
Platelets: 184 10*3/uL (ref 140–400)
RBC: 4.66 10*6/uL (ref 3.80–5.10)
RDW: 12.6 % (ref 11.0–15.0)
Total Lymphocyte: 40.7 %
WBC: 5.7 10*3/uL (ref 3.8–10.8)

## 2020-08-18 LAB — COMPREHENSIVE METABOLIC PANEL
AG Ratio: 1.8 (calc) (ref 1.0–2.5)
ALT: 13 U/L (ref 6–29)
AST: 13 U/L (ref 10–35)
Albumin: 4.5 g/dL (ref 3.6–5.1)
Alkaline phosphatase (APISO): 57 U/L (ref 37–153)
BUN: 16 mg/dL (ref 7–25)
CO2: 21 mmol/L (ref 20–32)
Calcium: 9.4 mg/dL (ref 8.6–10.4)
Chloride: 100 mmol/L (ref 98–110)
Creat: 0.82 mg/dL (ref 0.60–0.88)
Globulin: 2.5 g/dL (calc) (ref 1.9–3.7)
Glucose, Bld: 94 mg/dL (ref 65–99)
Potassium: 4.4 mmol/L (ref 3.5–5.3)
Sodium: 135 mmol/L (ref 135–146)
Total Bilirubin: 0.6 mg/dL (ref 0.2–1.2)
Total Protein: 7 g/dL (ref 6.1–8.1)

## 2020-08-18 LAB — VITAMIN B12: Vitamin B-12: 314 pg/mL (ref 200–1100)

## 2020-08-18 LAB — TSH: TSH: 1.57 mIU/L (ref 0.40–4.50)

## 2020-08-18 MED ORDER — TETANUS-DIPHTH-ACELL PERTUSSIS 5-2-15.5 LF-MCG/0.5 IM SUSP: 0.5000 mL | Freq: Once | INTRAMUSCULAR | 0 refills | Status: AC

## 2020-08-18 NOTE — Telephone Encounter (Signed)
Spoke with patient regarding results/recommendations.  

## 2020-08-18 NOTE — Addendum Note (Signed)
Addended by: Lavonna Monarch on: 08/18/2020 09:33 PM   Modules accepted: Level of Service

## 2020-08-18 NOTE — Telephone Encounter (Signed)
Patient returning call about results.

## 2020-08-21 ENCOUNTER — Other Ambulatory Visit: Payer: Self-pay | Admitting: Cardiovascular Disease

## 2020-08-22 ENCOUNTER — Ambulatory Visit (INDEPENDENT_AMBULATORY_CARE_PROVIDER_SITE_OTHER): Payer: Medicare Other | Admitting: Podiatry

## 2020-08-22 ENCOUNTER — Other Ambulatory Visit: Payer: Self-pay

## 2020-08-22 ENCOUNTER — Encounter: Payer: Self-pay | Admitting: Podiatry

## 2020-08-22 DIAGNOSIS — M79675 Pain in left toe(s): Secondary | ICD-10-CM

## 2020-08-22 DIAGNOSIS — L84 Corns and callosities: Secondary | ICD-10-CM

## 2020-08-22 DIAGNOSIS — B351 Tinea unguium: Secondary | ICD-10-CM

## 2020-08-22 DIAGNOSIS — G629 Polyneuropathy, unspecified: Secondary | ICD-10-CM

## 2020-08-22 DIAGNOSIS — M79674 Pain in right toe(s): Secondary | ICD-10-CM

## 2020-08-26 NOTE — Progress Notes (Signed)
Subjective:  Patient ID: Jennifer Peck, female    DOB: 1922/08/25,  MRN: [id number]  Jennifer Peck presents to clinic today for at risk foot care with history of peripheral neuropathy and corn(s) left 2nd toe, right 3rd toe , callus(es) right foot and painful mycotic nails.  Pain interferes with ambulation. Aggravating factors include wearing enclosed shoe gear. Painful toenails interfere with ambulation. Aggravating factors include wearing enclosed shoe gear. Pain is relieved with periodic professional debridement. Painful corns and calluses are aggravated when weightbearing with and without shoegear. Pain is relieved with periodic professional debridement.  Patient is accompanied by her daughter on today's visit. They note no new pedal concerns on today's visit.  PCP is Dr. Shawnie Dapper and last visit was 08/09/2020.  Allergies  Allergen Reactions   Chlorthalidone     hyponatremia   Atenolol     Caused fatigue    Besivance [Besifloxacin Hcl]     rash   Codeine Nausea And Vomiting   Crestor [Rosuvastatin]     "leg gave away"   Gabapentin     Memory loss and confusion   Lipitor [Atorvastatin] Other (See Comments)    Severe leg pain   Lovastatin     myalgia   Simvastatin     Her leg gave away on her and states thinks secondary to Simvastatin   Sulfa Antibiotics Nausea Only   Vigamox [Moxifloxacin]     rash   Welchol [Colesevelam Hcl]     Review of Systems: Negative except as noted in the HPI. Objective:   Constitutional JURI DINNING is a pleasant 87 y.o. Caucasian female, WD, WN in NAD. AAO x 3.   Vascular Capillary refill time to digits immediate b/l. Palpable pedal pulses b/l LE. Pedal hair sparse. Lower extremity skin temperature gradient within normal limits. No cyanosis or clubbing noted.  Neurologic Normal speech. Oriented to person, place, and time. Protective sensation diminished with 10g monofilament b/l.  Dermatologic Pedal skin with normal turgor, texture and  tone bilaterally. No open wounds bilaterally. No interdigital macerations bilaterally. Toenails 1-5 b/l elongated, discolored, dystrophic, thickened, crumbly with subungual debris and tenderness to dorsal palpation. Hyperkeratotic lesion(s) L 2nd toe, R 3rd toe, and submet head 5 right foot.  No erythema, no edema, no drainage, no fluctuance.  Orthopedic: Normal muscle strength 5/5 to all lower extremity muscle groups bilaterally. No pain crepitus or joint limitation noted with ROM b/l. Hallux valgus with bunion deformity noted b/l feet. Hammertoe(s) noted to the L 2nd toe, L 3rd toe, L 4th toe, R 2nd toe, R 3rd toe, and R 4th toe.   Radiographs: None Assessment:   1. Pain due to onychomycosis of toenails of both feet   2. Corns and callosities   3. Neuropathy    Plan:  Patient was evaluated and treated and all questions answered.  Onychomycosis with pain -Nails palliatively debridement as below -Educated on self-care  Procedure: Nail Debridement Rationale: Pain Type of Debridement: manual, sharp debridement. Instrumentation: Nail nipper, rotary burr. Number of Nails: 10 -Examined patient. -Patient to continue soft, supportive shoe gear daily. -Toenails 1-5 b/l were debrided in length and girth with sterile nail nippers and dremel without iatrogenic bleeding.  -Corn(s) L 2nd toe and R 3rd toe and callus(es) submet head 5 right foot were pared utilizing sterile scalpel blade without incident. Total number debrided =3. -Patient to report any pedal injuries to medical professional immediately. -Patient/POA to call should there be question/concern in the interim.  Return in  about 3 months (around 11/22/2020).  Marzetta Board, DPM

## 2020-09-30 ENCOUNTER — Other Ambulatory Visit: Payer: Self-pay | Admitting: Cardiovascular Disease

## 2020-10-04 ENCOUNTER — Other Ambulatory Visit: Payer: Self-pay | Admitting: Cardiovascular Disease

## 2020-10-17 ENCOUNTER — Ambulatory Visit (INDEPENDENT_AMBULATORY_CARE_PROVIDER_SITE_OTHER): Payer: Medicare Other | Admitting: Family Medicine

## 2020-10-17 ENCOUNTER — Other Ambulatory Visit: Payer: Self-pay

## 2020-10-17 ENCOUNTER — Encounter: Payer: Self-pay | Admitting: Family Medicine

## 2020-10-17 ENCOUNTER — Telehealth: Payer: Self-pay

## 2020-10-17 VITALS — BP 131/68 | HR 78 | Temp 97.8°F | Resp 16 | Ht 60.0 in | Wt 134.4 lb

## 2020-10-17 DIAGNOSIS — R2241 Localized swelling, mass and lump, right lower limb: Secondary | ICD-10-CM

## 2020-10-17 DIAGNOSIS — C44722 Squamous cell carcinoma of skin of right lower limb, including hip: Secondary | ICD-10-CM

## 2020-10-17 DIAGNOSIS — R29898 Other symptoms and signs involving the musculoskeletal system: Secondary | ICD-10-CM | POA: Diagnosis not present

## 2020-10-17 DIAGNOSIS — I2510 Atherosclerotic heart disease of native coronary artery without angina pectoris: Secondary | ICD-10-CM

## 2020-10-17 DIAGNOSIS — R531 Weakness: Secondary | ICD-10-CM

## 2020-10-17 DIAGNOSIS — I890 Lymphedema, not elsewhere classified: Secondary | ICD-10-CM

## 2020-10-17 DIAGNOSIS — I872 Venous insufficiency (chronic) (peripheral): Secondary | ICD-10-CM

## 2020-10-17 NOTE — Telephone Encounter (Signed)
[name] - Client TELEPHONE ADVICE RECORD [facility] Patient Name: Jennifer Peck [facility] Gender: Female DOB: 11/25/1922 Age: 87 Y 11 M 7 D Return Phone Number: [phone] (Primary) Address: City/ State/ Zip: [place]  [address] Client [facility] - Client Client Site [place] - Day Physician Crissie Sickles - MD Contact Type Call Who Is Calling Patient / Member / Family / Caregiver Call Type Triage / Clinical Relationship To Patient Self Return Phone Number [phone] (Primary) Chief Complaint Leg Pain Reason for Call Symptomatic / Request for Health Information Initial Comment Caller legs are giving out and has pain as well. Translation No Nurse Assessment Nurse: Micki Riley, RN, Domenick Gong Date/Time (Eastern Time): 10/17/2020 8:43:25 AM Confirm and document reason for call. If symptomatic, describe symptoms. ---Caller states she is having bilateral leg pain, numb (for a long time), 'little spots are coming out'/ bruised, & are 'giving out', still able to walk short distances; first noted since Thursday. Has been using a wheelchair, not able to go up/down steps. Denies injury or any other symptoms. Does the patient have any new or worsening symptoms? ---Yes Will a triage be completed? ---Yes Related visit to physician within the last 2 weeks? ---N/A Does the PT have any chronic conditions? (i.e. diabetes, asthma, this includes High risk factors for pregnancy, etc.) ---Unknown Is this a behavioral health or substance abuse call? ---No Guidelines Guideline Title Affirmed Question Affirmed Notes Nurse Date/Time Eilene Ghazi Time) Leg Pain Difficulty breathing Cazares, RN, Domenick Gong 10/17/2020 8:47:39 AM Disp. Time Eilene Ghazi Time) Disposition Final User 10/17/2020 8:48:48 AM Go to ED Now Yes Micki Riley, RN, Domenick Gong Caller Disagree/Comply Disagree PLEASE NOTE: All timestamps contained  within this report are represented as [place] Standard Time. CONFIDENTIALTY NOTICE: This fax transmission is intended only for the addressee. It contains information that is legally privileged, confidential or otherwise protected from use or disclosure. If you are not the intended recipient, you are strictly prohibited from reviewing, disclosing, copying using or disseminating any of this information or taking any action in reliance on or regarding this information. If you have received this fax in error, please notify us immediately by telephone so that we can arrange for its return to [place]. Phone: [phone], Toll-Free: [phone], Fax: [phone] Page: 2 of 2 Call Id: [id number] [place] Understands Yes PreDisposition InappropriateToAsk Care Advice Given Per Guideline GO TO ED NOW: NOTE TO TRIAGER - DRIVING: CALL EMS [id number] IF: * Call EMS if you become worse. CARE ADVICE given per Leg Pain (Adult) guideline. Comments User: Domenick Gong, Micki Riley, RN Date/Time Eilene Ghazi Time): 10/17/2020 8:53:07 AM Spoke with Santiago Glad at office backline [phone]; provided patient's information, reason for calling, & outcome/ refusal. Notified that caller is expecting a callback from office. Referrals GO TO FACILITY REFUSED

## 2020-10-17 NOTE — Progress Notes (Signed)
OFFICE VISIT  10/17/2020  CC:  Chief Complaint  Patient presents with   Leg Pain    Bilateral worsened in the last week; blotchy spot on R leg that has been present for 1 month. Tried using neosporin and covering with band-aid. She believes neuropathy is part of the problem.   HPI:    Patient is a 87 y.o. Caucasian female who presents for "leg pain, blotchy spots, weakness". I last saw her about 3 months ago. A/P as of that visit: "1) HTN; signif white coat component historically and presently.  No home monitoring b/c this only makes her more anxious. No change in meds. Lytes/cr check today.   2) HLD: hx of intol of statins and zetia. Nexletol recommended by cardiology but pt declines.   3) LE venous insufficiency edema: no edema lately. Low Na diet, elevate legs.   4) CAD: asymptomatic.  Cont ASA and BB. See #2 regarding statin.   5) Chronic fatigue: no red flags for concerning etiology but will check TSH and cbc as well as vit B12 level (hx of paresthesias fingers and toes--pt not clear on this today but pt with hx of dx of peripheral neuropathy by neurologist in 2012).   6) Preventative health: Due for Tdap booster->rx to pharmacy today."  INTERIM HX: Gradually worsening LE swelling, says going up legs further over time, freckling skin changes following.  R pretibial region with skin growth x 1 mo, crusty nodule about 2 cm size. No pain.  She does have hx of chronic venous stasis edema/lymphedema with associated chronic skin changes.  Lower legs paresthesia/numbness chronic, feels like legs weakness getting gradually worse, occ has to get pushed in [facility], uses someone's are for assistance other times sometimes.  ROS as above, plus--> no fevers, no CP, no SOB, no wheezing, no cough, no dizziness, no HAs, no rashes, no melena/hematochezia.  No polyuria or polydipsia.  No myalgias or arthralgias.  No focal weakness or tremors.  No acute vision or hearing abnormalities.  No dysuria  or unusual/new urinary urgency or frequency.  No recent changes in lower legs. No n/v/d or abd pain.  No palpitations.     Past Medical History:  Diagnosis Date   Allergic state 04/30/2011   Anxiety    BCC (basal cell carcinoma), face 11/06/2010   Bilateral bunions 11/06/2010   Chronic venous insufficiency    lymphedema + venous stasis pigmentation changes   Constipation 11/06/2010   Coronary artery disease 2011   BMS x 3   GERD (gastroesophageal reflux disease) 05/11/2012   History of hyperkalemia 01/23/2011   Hyperlipidemia    Intolerant of statins and zetia.  Refused to consider PCSK9 inhibitor initially but as of 02/2017 cardiol f/u she would consider repatha if approved.   Hypertension    +white coat component   Lesion of labia 02/04/2012   Melanoma in situ [facility]) 08/2016   Derm= Dr. Renda Rolls @ [facility] specialists in [place].   Osteoarthritis of both knees    R>L   Osteopenia 11/23/2010; 02/2016   2017 T-score -1.9.  Repeat DEXA 2 yrs.   Peripheral neuropathy 11/06/2010   Small fiber, non-diabetic per neurologist   Right lumbar radiculopathy 01/2015   Improved with PT   SCC (squamous cell carcinoma), arm 11/06/2010   Seborrheic keratosis 04/30/2011   Vertigo     Past Surgical History:  Procedure Laterality Date   CARDIAC CATHETERIZATION  12/20/2009   3 bare metal stents   DEXA  03/05/2016   T-score -  1.9   EYE SURGERY     cataract- left eye 09-24-11   SKIN BIOPSY  08/2012   Shave excision of lesion on pt's back: irritated seb keratosis.   TRANSTHORACIC ECHOCARDIOGRAM  12/05/2009   Mild LVH, normal systolic fxn, mild impaired relaxation.  No signif valvular dz.   TUBAL LIGATION      Outpatient Medications Prior to Visit  Medication Sig Dispense Refill   amLODipine (NORVASC) 5 MG tablet Take 1 tablet (5 mg total) by mouth daily. 90 tablet 3   aspirin 81 MG tablet Take 81 mg by mouth daily.     azelastine (OPTIVAR) 0.05 % ophthalmic solution INSTILL TWO DROPS INTO EACH EYE  TWICE DAILY 6 mL 12   B Complex Vitamins (B COMPLEX-B12 PO) Take by mouth daily.     Coenzyme Q10 (COQ10 PO) Take by mouth once a week.     enalapril (VASOTEC) 20 MG tablet TAKE 1 TABLET BY MOUTH IN THE MORNING AND 1/2 (ONE-HALF) AT BEDTIME 135 tablet 2   fluocinonide (LIDEX) 0.05 % external solution Apply 1 application topically daily as needed.     magnesium oxide (MAG-OX) 400 MG tablet Take 400 mg by mouth once a week.     metoprolol succinate (TOPROL-XL) 25 MG 24 hr tablet Take 1 tablet by mouth once daily 90 tablet 0   Multiple Vitamin (MULTIVITAMIN) tablet Take 1 tablet by mouth once a week. Takes occ     Naftifine HCl (NAFTIN) 2 % GEL Apply to affected area qd-bid 60 g 3   Omega-3 Fatty Acids (FISH OIL PO) Take 1 capsule by mouth once a week.     ketoconazole (NIZORAL) 2 % shampoo  (Patient not taking: Reported on 10/17/2020)     No facility-administered medications prior to visit.    Allergies  Allergen Reactions   Chlorthalidone     hyponatremia   Atenolol     Caused fatigue    Besivance [Besifloxacin Hcl]     rash   Codeine Nausea And Vomiting   Crestor [Rosuvastatin]     "leg gave away"   Gabapentin     Memory loss and confusion   Lipitor [Atorvastatin] Other (See Comments)    Severe leg pain   Lovastatin     myalgia   Simvastatin     Her leg gave away on her and states thinks secondary to Simvastatin   Sulfa Antibiotics Nausea Only   Vigamox [Moxifloxacin]     rash   Welchol [Colesevelam Hcl]     ROS As per HPI  PE: Vitals with BMI 10/17/2020 08/09/2020 06/13/2020  Height '5\' 0"'$  '5\' 0"'$  -  Weight 134 lbs 6 oz 135 lbs 10 oz -  BMI [id number] [id number] -  Systolic A999333 [id number]  Diastolic 68 72 76  Pulse 78 60 74   Gen: Alert, well appearing.  Patient is oriented to person, place, time, and situation. AFFECT: pleasant, lucid thought and speech. Bilat LL's mild nonpitting edema, freckling skin changes present.  No erythema. R pretibial region 2 cm round crusted nodule,  pink.  No fluctuance, no tenderness. No signif bruising of LL's.  Some spider veins but no varicosities.  No pitting.  LABS:  Lab Results  Component Value Date   TSH 1.57 08/17/2020   Lab Results  Component Value Date   WBC 5.7 08/17/2020   HGB 14.8 08/17/2020   HCT 45.0 08/17/2020   MCV 96.6 08/17/2020   PLT 184 08/17/2020   Lab Results  Component  Value Date   VITAMINB12 314 08/17/2020   Lab Results  Component Value Date   CREATININE 0.82 08/17/2020   BUN 16 08/17/2020   NA 135 08/17/2020   K 4.4 08/17/2020   CL 100 08/17/2020   CO2 21 08/17/2020   Lab Results  Component Value Date   ALT 13 08/17/2020   AST 13 08/17/2020   ALKPHOS 65 09/29/2019   BILITOT 0.6 08/17/2020   Lab Results  Component Value Date   CHOL 234 (H) 09/29/2019   Lab Results  Component Value Date   HDL 72 09/29/2019   Lab Results  Component Value Date   LDLCALC 146 (H) 09/29/2019   Lab Results  Component Value Date   TRIG 95 09/29/2019   Lab Results  Component Value Date   CHOLHDL 3.3 09/29/2019   IMPRESSION AND PLAN:  1) Chronic venous insufficiency, lymphedema.   With hemosiderin skin changes.   Reassured.  Minimize Na intake, elevate legs prn.  2) Skin nodule on R LL pretibial region. 2 cm size, needs excision and pathology assessment. Consent obtained, local anesthesia with 1 cc 1% lidocaine with epi. Used dermablade to do shave excision, entire lesion obtained, pt tolerated procedure well. No signif bleeding.  Dressing placed and discussed home care. She was comfortable taking care of this at home as it healed so we did not set up a f/u appt but she can call if she changes her mind.  Specimen sent to derm path.  3) Generalized weakness, unsteady gait. I recommended PT to assist with strengthening and gait stability, use of cane and or walker, etc. PT referral ordered today.  An After Visit Summary was printed and given to the patient.  FOLLOW UP: Return if symptoms  worsen or fail to improve.  Signed:  Crissie Sickles, MD           10/17/2020

## 2020-10-17 NOTE — Telephone Encounter (Signed)
Pt seen in office today.

## 2020-11-03 ENCOUNTER — Encounter: Payer: Self-pay | Admitting: Family Medicine

## 2020-11-03 ENCOUNTER — Telehealth: Payer: Self-pay | Admitting: Family Medicine

## 2020-11-03 NOTE — Telephone Encounter (Signed)
Spoke with patient regarding results/recommendations,voiced understanding.  

## 2020-11-03 NOTE — Telephone Encounter (Signed)
Pls notify pt that the spot I removed on her right leg a couple weeks ago was a skin cancer that is not dangerous.  It is not the kind of cancer that moves to other parts of the body or causes any kind of sickness. No need for any worry. I'll look at the area again when I see her in November.-thx

## 2020-11-23 ENCOUNTER — Ambulatory Visit (HOSPITAL_BASED_OUTPATIENT_CLINIC_OR_DEPARTMENT_OTHER): Payer: Medicare Other | Admitting: Cardiovascular Disease

## 2020-11-23 ENCOUNTER — Encounter: Payer: Self-pay | Admitting: Family Medicine

## 2020-11-23 ENCOUNTER — Other Ambulatory Visit: Payer: Self-pay

## 2020-11-23 ENCOUNTER — Ambulatory Visit (INDEPENDENT_AMBULATORY_CARE_PROVIDER_SITE_OTHER): Payer: Medicare Other | Admitting: Family Medicine

## 2020-11-23 VITALS — BP 134/76 | HR 77 | Temp 97.9°F | Wt 132.0 lb

## 2020-11-23 DIAGNOSIS — I2510 Atherosclerotic heart disease of native coronary artery without angina pectoris: Secondary | ICD-10-CM

## 2020-11-23 DIAGNOSIS — R21 Rash and other nonspecific skin eruption: Secondary | ICD-10-CM

## 2020-11-23 MED ORDER — PREDNISONE 10 MG PO TABS: ORAL_TABLET | ORAL | 0 refills | Status: DC

## 2020-11-23 MED ORDER — FLUTICASONE PROPIONATE 0.05 % EX CREA: TOPICAL_CREAM | CUTANEOUS | 0 refills | Status: DC

## 2020-11-23 NOTE — Progress Notes (Signed)
OFFICE VISIT  11/23/2020  CC:  Chief Complaint  Patient presents with   Rash    Legs, arms, and shoulder   HPI:    Patient is a 87 y.o. Caucasian female who presents for "rash on legs". Onset of itchy pink spots on legs, arms, shoulders---says it seems like she started noticing them not long after I did shave excision R shin lesion (SCC) 10/17/20. Very itchy on these spots.   No f/c/malaise.  No swelling of lips, tongue, throat, or eyes.  No hives.  No recent new contact irritants or foods. She does have hx of chronic venous stasis edema/lymphedema with associated chronic skin changes.  Past Medical History:  Diagnosis Date   Allergic state 04/30/2011   Anxiety    BCC (basal cell carcinoma), face 11/06/2010   Bilateral bunions 11/06/2010   Chronic venous insufficiency    lymphedema + venous stasis pigmentation changes   Constipation 11/06/2010   Coronary artery disease 2011   BMS x 3   GERD (gastroesophageal reflux disease) 05/11/2012   History of hyperkalemia 01/23/2011   Hyperlipidemia    Intolerant of statins and zetia.  Refused to consider PCSK9 inhibitor initially but as of 02/2017 cardiol f/u she would consider repatha if approved.   Hypertension    +white coat component   Lesion of labia 02/04/2012   Melanoma in situ [facility]) 08/2016   Derm= Dr. Renda Rolls @ [facility] specialists in [place].   Osteoarthritis of both knees    R>L   Osteopenia 11/23/2010; 02/2016   2017 T-score -1.9.  Repeat DEXA 2 yrs.   Peripheral neuropathy 11/06/2010   Small fiber, non-diabetic per neurologist   Right lumbar radiculopathy 01/2015   Improved with PT   SCC (squamous cell carcinoma), arm 11/06/2010   arm 2012.  R lower leg 10/2020   Seborrheic keratosis 04/30/2011   Vertigo     Past Surgical History:  Procedure Laterality Date   CARDIAC CATHETERIZATION  12/20/2009   3 bare metal stents   DEXA  03/05/2016   T-score -1.9   EYE SURGERY     cataract- left eye 09-24-11   SKIN BIOPSY   08/2012   Shave excision of lesion on pt's back: irritated seb keratosis.   TRANSTHORACIC ECHOCARDIOGRAM  12/05/2009   Mild LVH, normal systolic fxn, mild impaired relaxation.  No signif valvular dz.   TUBAL LIGATION      Outpatient Medications Prior to Visit  Medication Sig Dispense Refill   amLODipine (NORVASC) 5 MG tablet Take 1 tablet (5 mg total) by mouth daily. 90 tablet 3   aspirin 81 MG tablet Take 81 mg by mouth daily.     azelastine (OPTIVAR) 0.05 % ophthalmic solution INSTILL TWO DROPS INTO EACH EYE TWICE DAILY 6 mL 12   B Complex Vitamins (B COMPLEX-B12 PO) Take by mouth daily.     Coenzyme Q10 (COQ10 PO) Take by mouth once a week.     enalapril (VASOTEC) 20 MG tablet TAKE 1 TABLET BY MOUTH IN THE MORNING AND 1/2 (ONE-HALF) AT BEDTIME 135 tablet 2   fluocinonide (LIDEX) 0.05 % external solution Apply 1 application topically daily as needed.     magnesium oxide (MAG-OX) 400 MG tablet Take 400 mg by mouth once a week.     metoprolol succinate (TOPROL-XL) 25 MG 24 hr tablet Take 1 tablet by mouth once daily 90 tablet 0   Multiple Vitamin (MULTIVITAMIN) tablet Take 1 tablet by mouth once a week. Takes occ  Naftifine HCl (NAFTIN) 2 % GEL Apply to affected area qd-bid 60 g 3   Omega-3 Fatty Acids (FISH OIL PO) Take 1 capsule by mouth once a week.     No facility-administered medications prior to visit.    Allergies  Allergen Reactions   Chlorthalidone     hyponatremia   Atenolol     Caused fatigue    Besivance [Besifloxacin Hcl]     rash   Codeine Nausea And Vomiting   Crestor [Rosuvastatin]     "leg gave away"   Gabapentin     Memory loss and confusion   Lipitor [Atorvastatin] Other (See Comments)    Severe leg pain   Lovastatin     myalgia   Simvastatin     Her leg gave away on her and states thinks secondary to Simvastatin   Sulfa Antibiotics Nausea Only   Vigamox [Moxifloxacin]     rash   Welchol [Colesevelam Hcl]     ROS As per HPI  PE: Vitals with  BMI 11/23/2020 10/17/2020 08/09/2020  Height - '5\' 0"'$  '5\' 0"'$   Weight 132 lbs 134 lbs 6 oz 135 lbs 10 oz  BMI - A999333 [id number]  Systolic [id number] A999333 [id number]  Diastolic 76 68 72  Pulse 77 78 60   Gen: Alert, well appearing.  Patient is oriented to person, place, time, and situation. AFFECT: pleasant, lucid thought and speech. SKIN: abundant scattered hyperpigmented macules, some small ecchymotic changes over dorsal surfaces of both hands, crusty 2 nodule on R shin. She has about 12-15 small oval pinkish macules, some with some mildly crusty surface. No hives, no pustules, no vesicles.  LABS:    Chemistry      Component Value Date/Time   NA 135 08/17/2020 1040   NA 138 09/29/2019 1214   K 4.4 08/17/2020 1040   CL 100 08/17/2020 1040   CO2 21 08/17/2020 1040   BUN 16 08/17/2020 1040   BUN 13 09/29/2019 1214   CREATININE 0.82 08/17/2020 1040      Component Value Date/Time   CALCIUM 9.4 08/17/2020 1040   ALKPHOS 65 09/29/2019 1214   AST 13 08/17/2020 1040   ALT 13 08/17/2020 1040   BILITOT 0.6 08/17/2020 1040   BILITOT 0.5 09/29/2019 1214     Lab Results  Component Value Date   WBC 5.7 08/17/2020   HGB 14.8 08/17/2020   HCT 45.0 08/17/2020   MCV 96.6 08/17/2020   PLT 184 08/17/2020   Lab Results  Component Value Date   HGBA1C  09/03/2009    5.4 (NOTE)                                                                       According to the ADA Clinical Practice Recommendations for 2011, when HbA1c is used as a screening test:   >=6.5%   Diagnostic of Diabetes Mellitus           (if abnormal result  is confirmed)  5.7-6.4%   Increased risk of developing Diabetes Mellitus  References:Diagnosis and Classification of Diabetes Mellitus,Diabetes [id number] 1):S62-S69 and Standards of Medical Care in         Diabetes - 2011,Diabetes Care,2011,34  (Suppl 1):S11-S61.   Lab Results  Component Value  Date   TSH 1.57 08/17/2020     IMPRESSION AND PLAN:  1) Scattered pinkish scaly macules:  ?eczematous dermatitis, ? Multiple AKs. Will treat with cutivate 0.05% cream bid to the areas that are most itchy. Prednisone '30mg'$  qd x 3d, then '20mg'$  qd x 3d, then '10mg'$  qd x 3d. Continue aveeno cream. R shin nodule has possibly recurred--vs hypertrophic scarring.  She bled a lot from the shave excision 10/17/20 and she does not want an excision again (or any procedure at this time).  Obs.  An After Visit Summary was printed and given to the patient.  FOLLOW UP: No follow-ups on file.  Signed:  Crissie Sickles, MD           11/23/2020

## 2020-11-25 ENCOUNTER — Encounter (HOSPITAL_BASED_OUTPATIENT_CLINIC_OR_DEPARTMENT_OTHER): Payer: Self-pay | Admitting: Cardiovascular Disease

## 2020-11-25 ENCOUNTER — Ambulatory Visit (INDEPENDENT_AMBULATORY_CARE_PROVIDER_SITE_OTHER): Payer: Medicare Other | Admitting: Cardiovascular Disease

## 2020-11-25 ENCOUNTER — Other Ambulatory Visit: Payer: Self-pay

## 2020-11-25 VITALS — BP 136/68 | HR 84 | Ht 60.0 in | Wt 133.0 lb

## 2020-11-25 DIAGNOSIS — R011 Cardiac murmur, unspecified: Secondary | ICD-10-CM

## 2020-11-25 DIAGNOSIS — I2510 Atherosclerotic heart disease of native coronary artery without angina pectoris: Secondary | ICD-10-CM | POA: Diagnosis not present

## 2020-11-25 DIAGNOSIS — I10 Essential (primary) hypertension: Secondary | ICD-10-CM | POA: Diagnosis not present

## 2020-11-25 DIAGNOSIS — I25118 Atherosclerotic heart disease of native coronary artery with other forms of angina pectoris: Secondary | ICD-10-CM

## 2020-11-25 DIAGNOSIS — E7800 Pure hypercholesterolemia, unspecified: Secondary | ICD-10-CM

## 2020-11-25 NOTE — Assessment & Plan Note (Addendum)
Lipids are poorly controlled and she doesn't tolerate statins.  Won't try any other agents.  She says she will try them when she turns 89.

## 2020-11-25 NOTE — Patient Instructions (Signed)
Medication Instructions:  Your physician recommends that you continue on your current medications as directed. Please refer to the Current Medication list given to you today.   *If you need a refill on your cardiac medications before your next appointment, please call your pharmacy*  Lab Work: NONE  Testing/Procedures: Your physician has requested that you have an echocardiogram. Echocardiography is a painless test that uses sound waves to create images of your heart. It provides your doctor with information about the size and shape of your heart and how well your heart's chambers and valves are working. This procedure takes approximately one hour. There are no restrictions for this procedure.   Follow-Up: At [facility], you and your health needs are our priority.  As part of our continuing mission to provide you with exceptional heart care, we have created designated Provider Care Teams.  These Care Teams include your primary Cardiologist (physician) and Advanced Practice Providers (APPs -  Physician Assistants and Nurse Practitioners) who all work together to provide you with the care you need, when you need it.  We recommend signing up for the patient portal called "MyChart".  Sign up information is provided on this After Visit Summary.  MyChart is used to connect with patients for Virtual Visits (Telemedicine).  Patients are able to view lab/test results, encounter notes, upcoming appointments, etc.  Non-urgent messages can be sent to your provider as well.   To learn more about what you can do with MyChart, go to [email or web address].    Your next appointment:   12 month(s)  The format for your next appointment:   In Person  Provider:   Tiffany [place], MD         

## 2020-11-25 NOTE — Assessment & Plan Note (Addendum)
S/p PCI.  She exercises regularly and has no angina.  Continue aspirin, metoprolol, and amlodipine.  She hasn't tolerated statins and doesn't want PCSK9 inhibitors.

## 2020-11-25 NOTE — Progress Notes (Signed)
Cardiology Office Note   Date:  11/28/2020   ID:  Jennifer Peck, DOB 03/26/1922, MRN [id number]  PCP:  Tammi Sou, MD  Cardiologist:   Skeet Latch, MD   No chief complaint on file.   History of Present Illness: Jennifer Peck is a 87 y.o. female with CAD s/p PCI, hypertension and hyperlipidemia who presents for follow-up.  Jennifer Peck was previously patient of [name]. She last saw him 02/2015.  She had a bare metal stents placed in her left circumflex, RCA, OM 1 and OM 2 vessels in 2011.  She has not tolerated statin medications or Zetia in the past due to myalgias.  She has tried to manage her lipids with diet and exercise.  She was not interested in trying a PCSK9 inhibitor.  She called the office 2/22, with blood pressures in the 200s.She had to reduced her metoprolol with concerns to causing her hair to thin.she followed up with Jennifer Rives, PA-C and had stabilized after taking her medications.   Today, patient is having a breakout on her legs and its starting to escalate to her arms. She recently started Fluticasone cream and Prednisone 10 mg. She reported LE edema but thinks its from breakout in legs. She also reported panic attacks and her memory being "scrambled up". She exercises 3 times a week in the mornings. She does 30 minutes of warm-ups and legs and other 30 minutes of arms and stretching with no complications. She has occasional shortness of breath when walking too far but has not worsen. She feels the exercising helps with shortness of breath however denies chest pains or pain in legs. Her blood pressure at home ranges systolic [id number].   Past Medical History:  Diagnosis Date   Allergic state 04/30/2011   Anxiety    BCC (basal cell carcinoma), face 11/06/2010   Bilateral bunions 11/06/2010   Chronic venous insufficiency    lymphedema + venous stasis pigmentation changes   Constipation 11/06/2010   Coronary artery disease 2011   BMS x 3   GERD  (gastroesophageal reflux disease) 05/11/2012   History of hyperkalemia 01/23/2011   Hyperlipidemia    Intolerant of statins and zetia.  Refused to consider PCSK9 inhibitor initially but as of 02/2017 cardiol f/u she would consider repatha if approved.   Hypertension    +white coat component   Lesion of labia 02/04/2012   Melanoma in situ [facility]) 08/2016   Derm= Dr. Renda Rolls @ [facility] in [place].   Osteoarthritis of both knees    R>L   Osteopenia 11/23/2010; 02/2016   2017 T-score -1.9.  Repeat DEXA 2 yrs.   Peripheral neuropathy 11/06/2010   Small fiber, non-diabetic per neurologist   Right lumbar radiculopathy 01/2015   Improved with PT   SCC (squamous cell carcinoma), arm 11/06/2010   arm 2012.  R lower leg 10/2020   Seborrheic keratosis 04/30/2011   Vertigo     Past Surgical History:  Procedure Laterality Date   CARDIAC CATHETERIZATION  12/20/2009   3 bare metal stents   DEXA  03/05/2016   T-score -1.9   EYE SURGERY     cataract- left eye 09-24-11   SKIN BIOPSY  08/2012   Shave excision of lesion on pt's back: irritated seb keratosis.   TRANSTHORACIC ECHOCARDIOGRAM  12/05/2009   Mild LVH, normal systolic fxn, mild impaired relaxation.  No signif valvular dz.   TUBAL LIGATION       Current Outpatient Medications  Medication  Sig Dispense Refill   amLODipine (NORVASC) 5 MG tablet Take 1 tablet (5 mg total) by mouth daily. 90 tablet 3   azelastine (OPTIVAR) 0.05 % ophthalmic solution INSTILL TWO DROPS INTO EACH EYE TWICE DAILY 6 mL 12   B Complex Vitamins (B COMPLEX-B12 PO) Take by mouth daily.     Coenzyme Q10 (COQ10 PO) Take by mouth once a week.     enalapril (VASOTEC) 20 MG tablet TAKE 1 TABLET BY MOUTH IN THE MORNING AND 1/2 (ONE-HALF) AT BEDTIME 135 tablet 2   fluocinonide (LIDEX) 0.05 % external solution Apply 1 application topically daily as needed.     fluticasone (CUTIVATE) 0.05 % cream Apply to affected area of itchy skin bid as needed 30 g 0    magnesium oxide (MAG-OX) 400 MG tablet Take 400 mg by mouth once a week.     metoprolol succinate (TOPROL-XL) 25 MG 24 hr tablet Take 1 tablet by mouth once daily 90 tablet 0   Multiple Vitamin (MULTIVITAMIN) tablet Take 1 tablet by mouth once a week. Takes occ     Naftifine HCl (NAFTIN) 2 % GEL Apply to affected area qd-bid 60 g 3   Omega-3 Fatty Acids (FISH OIL PO) Take 1 capsule by mouth once a week.     predniSONE (DELTASONE) 10 MG tablet 3 tabs po qd x 3d, then 2 tabs po qd x 3d, then 1 tab po qd x 3d 18 tablet 0   aspirin 81 MG tablet Take 81 mg by mouth daily. (Patient not taking: Reported on 11/25/2020)     No current facility-administered medications for this visit.    Allergies:   Chlorthalidone, Atenolol, Besivance [besifloxacin hcl], Codeine, Crestor [rosuvastatin], Gabapentin, Lipitor [atorvastatin], Lovastatin, Simvastatin, Sulfa antibiotics, Vigamox [moxifloxacin], and Welchol [colesevelam hcl]    Social History:  The patient  reports that she quit smoking about 43 years ago. Her smoking use included cigarettes. She has never used smokeless tobacco. She reports current alcohol use. She reports that she does not use drugs.   Family History:  The patient's family history includes Asthma in her paternal grandfather; Cancer in her daughter; Heart disease in her brother and father; Hyperlipidemia in her daughter and son; Hypertension in her daughter, daughter, son, son, and son; Stroke in her mother and paternal grandmother; Vision loss in her maternal grandfather.    ROS:  Please see the history of present illness.    (+) breakout in legs and arms  (+) shortness of breath.  (+)LE edema (+) panic attacks  (+) memory loss   All other systems are reviewed and negative.    PHYSICAL EXAM: VS:  BP 136/68   Pulse 84   Ht 5' (1.524 m)   Wt 133 lb (60.3 kg)   SpO2 96%   BMI 25.97 kg/m  , BMI Body mass index is 25.97 kg/m. GENERAL:  Well appearing HEENT: Pupils equal round and  reactive, fundi not visualized, oral mucosa unremarkable NECK:  No jugular venous distention, waveform within normal limits, carotid upstroke brisk and symmetric, no bruits LUNGS:  Clear to auscultation bilaterally HEART:  RRR.  PMI not displaced or sustained,S1 and S2 within normal limits, no S3, no S4, no clicks, no rubs, no murmurs ABD:  Flat, positive bowel sounds normal in frequency in pitch, no bruits, no rebound, no guarding, no midline pulsatile mass, no hepatomegaly, no splenomegaly EXT:  2 plus pulses throughout, no edema, no cyanosis no clubbing SKIN:  No rashes no nodules NEURO:  Cranial nerves II through XII grossly intact, motor grossly intact throughout PSYCH:  Cognitively intact, oriented to person place and time  EKG:   The ekg ordered 03/14/17 demonstrates sinus rhythm. Rate 77 bpm.   02/19/17: Sinus rhythm.  Rate 70 bpm.  PVCs.  11/13/17: Sinus rhythm.  Rate 78 bpm.  Non-specific ST changes.   09/29/19: Sinus rhythm.  Rate 77 bpm.  PACs.   9/22: no EKG was ordered today.   Recent Labs: 08/17/2020: ALT 13; BUN 16; Creat 0.82; Hemoglobin 14.8; Platelets 184; Potassium 4.4; Sodium 135; TSH 1.57    Lipid Panel    Component Value Date/Time   CHOL 234 (H) 09/29/2019 1214   TRIG 95 09/29/2019 1214   HDL 72 09/29/2019 1214   CHOLHDL 3.3 09/29/2019 1214   CHOLHDL 3 01/30/2017 0904   VLDL 19.6 01/30/2017 0904   LDLCALC 146 (H) 09/29/2019 1214   LDLDIRECT 128.0 12/02/2012 0837      Wt Readings from Last 3 Encounters:  11/25/20 133 lb (60.3 kg)  11/23/20 132 lb (59.9 kg)  10/17/20 134 lb 6.4 oz (61 kg)      ASSESSMENT AND PLAN:  Coronary atherosclerosis S/p PCI.  She exercises regularly and has no angina.  Continue aspirin, metoprolol, and amlodipine.  She hasn't tolerated statins and doesn't want PCSK9 inhibitors.   Hyperlipidemia Lipids are poorly controlled and she doesn't tolerate statins.  Won't try any other agents.  She says she will try them when she turns  89.   Essential hypertension BP much better since adding amlodipine.  Continue enalapril, metoprolol and amlodipine.    Current medicines are reviewed at length with the patient today.  The patient does not have concerns regarding medicines.  The following changes have been made:  no change  Labs/ tests ordered today include:   Orders Placed This Encounter  Procedures   ECHOCARDIOGRAM COMPLETE     Disposition:   FU with Albert Devaul C. Oval Linsey, MD, [facility] in 1 year   I,Jada Bradford,acting as a scribe for Skeet Latch, MD.,have documented all relevant documentation on the behalf of Skeet Latch, MD,as directed by  Skeet Latch, MD while in the presence of Skeet Latch, MD.  I, [place] Oval Linsey, MD have reviewed all documentation for this visit.  The documentation of the exam, diagnosis, procedures, and orders on 11/28/2020 are all accurate and complete.   Signed, Wannetta Langland C. Oval Linsey, MD, [facility]  11/28/2020 11:02 AM    [place]

## 2020-11-25 NOTE — Assessment & Plan Note (Signed)
BP much better since adding amlodipine.  Continue enalapril, metoprolol and amlodipine.

## 2020-11-29 ENCOUNTER — Encounter: Payer: Self-pay | Admitting: Podiatry

## 2020-11-29 ENCOUNTER — Other Ambulatory Visit: Payer: Self-pay

## 2020-11-29 ENCOUNTER — Ambulatory Visit (INDEPENDENT_AMBULATORY_CARE_PROVIDER_SITE_OTHER): Payer: Medicare Other | Admitting: Podiatry

## 2020-11-29 DIAGNOSIS — G629 Polyneuropathy, unspecified: Secondary | ICD-10-CM | POA: Diagnosis not present

## 2020-11-29 DIAGNOSIS — M79674 Pain in right toe(s): Secondary | ICD-10-CM

## 2020-11-29 DIAGNOSIS — B351 Tinea unguium: Secondary | ICD-10-CM

## 2020-11-29 DIAGNOSIS — L84 Corns and callosities: Secondary | ICD-10-CM

## 2020-11-29 DIAGNOSIS — M79675 Pain in left toe(s): Secondary | ICD-10-CM | POA: Diagnosis not present

## 2020-11-29 NOTE — Progress Notes (Signed)
Subjective: Jennifer Peck is a 87 y.o. female patient seen today for at risk foot care. Patient has h/o peripheral neuropathy. She is seen for  follow up of  painful thick toenails that are difficult to trim. Pain interferes with ambulation. Aggravating factors include wearing enclosed shoe gear. Pain is relieved with periodic professional debridement.  Jennifer Peck also has corns and calluses which are tender when she wears enclosed shoegear. Symptoms resolve with periodic professional debridement.  PCP is McGowen, Adrian Blackwater, MD. Last visit was: 11/23/2020.  New problems reported today: None.  Allergies  Allergen Reactions   Chlorthalidone     hyponatremia   Atenolol     Caused fatigue    Besivance [Besifloxacin Hcl]     rash   Codeine Nausea And Vomiting   Crestor [Rosuvastatin]     "leg gave away"   Gabapentin     Memory loss and confusion   Lipitor [Atorvastatin] Other (See Comments)    Severe leg pain   Lovastatin     myalgia   Simvastatin     Her leg gave away on her and states thinks secondary to Simvastatin   Sulfa Antibiotics Nausea Only   Vigamox [Moxifloxacin]     rash   Welchol [Colesevelam Hcl]     PCP is McGowen, Adrian Blackwater, MD .  Objective: Physical Exam  General: Patient is a pleasant 87 y.o. Caucasian female WD, WN in NAD. AAO x 3.   Neurovascular Examination: Capillary refill time to digits immediate b/l. Palpable pedal pulses b/l LE. Pedal hair present. Lower extremity skin temperature gradient within normal limits. No edema noted b/l lower extremities.   Protective sensation diminished bilaterally with 10g monofilament b/l.  Dermatological:  Skin warm and supple b/l lower extremities. No open wounds b/l lower extremities. No interdigital macerations b/l lower extremities. Toenails 1-5 b/l elongated, discolored, dystrophic, thickened, crumbly with subungual debris and tenderness to dorsal palpation. Hyperkeratotic lesion(s) L 2nd toe, R 3rd toe, and  submet head 5 right foot with tenderness to palpation. No edema, no erythema, no drainage, no fluctuance.   Musculoskeletal:  Normal muscle strength 5/5 to all lower extremity muscle groups bilaterally. No pain crepitus or joint limitation noted with ROM b/l lower extremities.  Gross deformities: Normal muscle strength 5/5 to all lower extremity muscle groups bilaterally. Hammertoe(s) noted to the L 2nd toe, L 3rd toe, L 4th toe, R 2nd toe, R 3rd toe, and R 4th toe.  Assessment: 1. Pain due to onychomycosis of toenails of both feet   2. Corns and callosities   3. Neuropathy   Plan: -Examined patient. -Patient to continue soft, supportive shoe gear daily. -Toenails 1-5 b/l were debrided in length and girth with sterile nail nippers and dremel without iatrogenic bleeding.  -Corn(s) L 2nd toe and R 3rd toe and callus(es) submet head 5 right foot were pared utilizing sterile scalpel blade without incident. Total number debrided =3. -Patient to report any pedal injuries to medical professional immediately. -Patient/POA to call should there be question/concern in the interim.  Return in about 3 months (around 02/28/2021).  Marzetta Board, DPM

## 2020-12-01 ENCOUNTER — Other Ambulatory Visit: Payer: Self-pay

## 2020-12-01 ENCOUNTER — Ambulatory Visit (INDEPENDENT_AMBULATORY_CARE_PROVIDER_SITE_OTHER): Payer: Medicare Other

## 2020-12-01 DIAGNOSIS — R011 Cardiac murmur, unspecified: Secondary | ICD-10-CM

## 2020-12-07 ENCOUNTER — Ambulatory Visit: Payer: Medicare Other | Admitting: Family Medicine

## 2020-12-15 LAB — ECHOCARDIOGRAM COMPLETE
Area-P 1/2: 5.97 cm2
MV M vel: 6.05 m/s
MV Peak grad: 146.4 mmHg
P 1/2 time: 441 msec
S' Lateral: 3.24 cm

## 2020-12-17 DIAGNOSIS — I340 Nonrheumatic mitral (valve) insufficiency: Secondary | ICD-10-CM

## 2020-12-17 HISTORY — DX: Nonrheumatic mitral (valve) insufficiency: I34.0

## 2020-12-27 ENCOUNTER — Other Ambulatory Visit: Payer: Self-pay | Admitting: Dermatology

## 2020-12-28 LAB — DERMPATH, SPECIMEN A

## 2020-12-28 LAB — DERMATOPATHOLOGY REPORT

## 2021-01-09 ENCOUNTER — Encounter: Payer: Self-pay | Admitting: Family Medicine

## 2021-01-09 NOTE — Progress Notes (Signed)
Noted.  EMR updated.

## 2021-01-12 ENCOUNTER — Telehealth: Payer: Self-pay | Admitting: Family Medicine

## 2021-01-12 NOTE — Telephone Encounter (Signed)
Spoke with Jennifer Peck (pt) to schedule her AWV, she stated that Dr Renda Rolls did not receive the fax from the office.  Please fax the orders for her surgery to [phone]. She stated she will try calling the office also.

## 2021-02-02 ENCOUNTER — Other Ambulatory Visit: Payer: Self-pay

## 2021-02-02 ENCOUNTER — Encounter: Payer: Self-pay | Admitting: Family Medicine

## 2021-02-02 ENCOUNTER — Ambulatory Visit (INDEPENDENT_AMBULATORY_CARE_PROVIDER_SITE_OTHER): Payer: Medicare Other | Admitting: Family Medicine

## 2021-02-02 VITALS — BP 154/66 | HR 73 | Temp 97.8°F | Ht 60.0 in | Wt 132.0 lb

## 2021-02-02 DIAGNOSIS — I2510 Atherosclerotic heart disease of native coronary artery without angina pectoris: Secondary | ICD-10-CM | POA: Diagnosis not present

## 2021-02-02 DIAGNOSIS — R21 Rash and other nonspecific skin eruption: Secondary | ICD-10-CM

## 2021-02-02 DIAGNOSIS — Z23 Encounter for immunization: Secondary | ICD-10-CM

## 2021-02-02 DIAGNOSIS — I10 Essential (primary) hypertension: Secondary | ICD-10-CM

## 2021-02-02 DIAGNOSIS — R2241 Localized swelling, mass and lump, right lower limb: Secondary | ICD-10-CM

## 2021-02-02 DIAGNOSIS — R600 Localized edema: Secondary | ICD-10-CM | POA: Diagnosis not present

## 2021-02-02 DIAGNOSIS — Z85828 Personal history of other malignant neoplasm of skin: Secondary | ICD-10-CM

## 2021-02-02 LAB — BASIC METABOLIC PANEL
BUN: 19 mg/dL (ref 6–23)
CO2: 27 mEq/L (ref 19–32)
Calcium: 9.5 mg/dL (ref 8.4–10.5)
Chloride: 97 mEq/L (ref 96–112)
Creatinine, Ser: 0.71 mg/dL (ref 0.40–1.20)
GFR: 71.86 mL/min (ref >60.00)
Glucose, Bld: 97 mg/dL (ref 70–99)
Potassium: 4.1 mEq/L (ref 3.5–5.1)
Sodium: 132 mEq/L — ABNORMAL LOW (ref 135–145)

## 2021-02-02 MED ORDER — TETANUS-DIPHTH-ACELL PERTUSSIS 5-2-15.5 LF-MCG/0.5 IM SUSP: 0.5000 mL | Freq: Once | INTRAMUSCULAR | 0 refills | Status: AC

## 2021-02-02 MED ORDER — NAFTIN 2 % EX GEL: CUTANEOUS | 3 refills | Status: AC

## 2021-02-02 MED ORDER — FLUTICASONE PROPIONATE 0.05 % EX CREA: TOPICAL_CREAM | CUTANEOUS | 2 refills | Status: DC

## 2021-02-02 NOTE — Progress Notes (Signed)
OFFICE VISIT  02/02/2021  CC:  Chief Complaint  Patient presents with   Follow-up    RCI; pt is not fasting . Pt states she had a reaction to nodule removed a couple of months ago and has started having sores on legs, thighs, arms, back and chest since then.    HPI:    Patient is a 87 y.o. female who presents for 6 mo f/u HTN, LE venous insufficiency edema, CAD. Has hx of HLD ->statin and zetia intol. A/P as of last visit: "1) HTN; signif white coat component historically and presently.  No home monitoring b/c this only makes her more anxious. No change in meds. Lytes/cr check today.   2) HLD: hx of intol of statins and zetia. Nexletol recommended by cardiology but pt declines.   3) LE venous insufficiency edema: no edema lately. Low Na diet, elevate legs.   4) CAD: asymptomatic.  Cont ASA and BB. See #2 regarding statin.   5) Chronic fatigue: no red flags for concerning etiology but will check TSH and cbc as well as vit B12 level (hx of paresthesias fingers and toes--pt not clear on this today but pt with hx of dx of peripheral neuropathy by neurologist in 2012).   6) Preventative health: Due for Tdap booster->rx to pharmacy today."  INTERIM HX: Jennifer Peck reports ongoing concerns with the scattered pinkish itchy spots on skin. Additionally, the skin lesion on her right shin has grown back.  It is significantly crusty/hyperkeratotic.  She has been referred by her dermatologist to the skin surgery center for consideration of Mohs surgery. Jennifer Peck is quite concerned about her skin lesions, is worried and pretty convinced that these are related to the right shin lesion, specifically the removal of it by me back in August this year. These itchy lesions did start after that but I told her it is difficult to make the connection between removing that lesion and the start of these.  Prednisone did not help these. Fluticasone cream has helped some as far as the itchy and uncomfortable  sensation.  No recent problem with lower extremity swelling.  No home blood pressure monitoring data today.  ROS as above, plus--> no fevers, no CP, no SOB, no wheezing, no cough, no dizziness, no HAs, no rashes, no melena/hematochezia.  No polyuria or polydipsia.  No myalgias or arthralgias.  No focal weakness, paresthesias, or tremors.  No acute vision or hearing abnormalities.  No dysuria or unusual/new urinary urgency or frequency.  No recent changes in lower legs. No n/v/d or abd pain.  No palpitations.    Past Medical History:  Diagnosis Date   Allergic state 04/30/2011   Anxiety    BCC (basal cell carcinoma), face 11/06/2010   Bilateral bunions 11/06/2010   Chronic venous insufficiency    lymphedema + venous stasis pigmentation changes   Constipation 11/06/2010   Coronary artery disease 2011   BMS x 3   Diastolic heart failure (HCC)    GERD (gastroesophageal reflux disease) 05/11/2012   History of hyperkalemia 01/23/2011   Hyperlipidemia    Intolerant of statins and zetia.  Refused to consider PCSK9 inhibitor initially but as of 02/2017 cardiol f/u she would consider repatha if approved.   Hypertension    +white coat component   Lesion of labia 02/04/2012   Melanoma in situ [facility]) 08/2016   Derm= Dr. Renda Rolls @ [facility] specialists in [place].   Mitral regurgitation 12/2020   moderate by echo 12/2020   Osteoarthritis of both knees  R>L   Osteopenia 11/23/2010; 02/2016   2017 T-score -1.9.  Repeat DEXA 2 yrs.   Peripheral neuropathy 11/06/2010   Small fiber, non-diabetic per neurologist   Right lumbar radiculopathy 01/2015   Improved with PT   SCC (squamous cell carcinoma), arm 11/06/2010   arm 2012.  R lower leg 10/2020   Seborrheic keratosis 04/30/2011   Vertigo     Past Surgical History:  Procedure Laterality Date   CARDIAC CATHETERIZATION  12/20/2009   3 bare metal stents   DEXA  03/05/2016   T-score -1.9   EYE SURGERY     cataract- left eye 09-24-11    SKIN BIOPSY  08/2012   Shave excision of lesion on pt's back: irritated seb keratosis.   TRANSTHORACIC ECHOCARDIOGRAM  12/05/2009   Mild LVH, normal systolic fxn, mild impaired relaxation.  No signif valvular dz. 12/01/20->EF 60-65%, grd II DD, mod MR, +mild pulm art htn/   TUBAL LIGATION      Outpatient Medications Prior to Visit  Medication Sig Dispense Refill   amLODipine (NORVASC) 5 MG tablet Take 1 tablet (5 mg total) by mouth daily. 90 tablet 3   azelastine (OPTIVAR) 0.05 % ophthalmic solution INSTILL TWO DROPS INTO EACH EYE TWICE DAILY 6 mL 12   B Complex Vitamins (B COMPLEX-B12 PO) Take by mouth daily.     Coenzyme Q10 (COQ10 PO) Take by mouth once a week.     enalapril (VASOTEC) 20 MG tablet TAKE 1 TABLET BY MOUTH IN THE MORNING AND 1/2 (ONE-HALF) AT BEDTIME 135 tablet 2   fluocinonide (LIDEX) 0.05 % external solution Apply 1 application topically daily as needed.     magnesium oxide (MAG-OX) 400 MG tablet Take 400 mg by mouth once a week.     metoprolol succinate (TOPROL-XL) 25 MG 24 hr tablet Take 1 tablet by mouth once daily 90 tablet 0   Multiple Vitamin (MULTIVITAMIN) tablet Take 1 tablet by mouth once a week. Takes occ     Omega-3 Fatty Acids (FISH OIL PO) Take 1 capsule by mouth once a week.     fluticasone (CUTIVATE) 0.05 % cream Apply to affected area of itchy skin bid as needed 30 g 0   Naftifine HCl (NAFTIN) 2 % GEL Apply to affected area qd-bid 60 g 3   aspirin 81 MG tablet Take 81 mg by mouth daily. (Patient not taking: Reported on 11/25/2020)     predniSONE (DELTASONE) 10 MG tablet 3 tabs po qd x 3d, then 2 tabs po qd x 3d, then 1 tab po qd x 3d (Patient not taking: Reported on 02/02/2021) 18 tablet 0   No facility-administered medications prior to visit.    Allergies  Allergen Reactions   Chlorthalidone     hyponatremia   Atenolol     Caused fatigue    Besivance [Besifloxacin Hcl]     rash   Codeine Nausea And Vomiting   Crestor [Rosuvastatin]     "leg gave  away"   Gabapentin     Memory loss and confusion   Lipitor [Atorvastatin] Other (See Comments)    Severe leg pain   Lovastatin     myalgia   Simvastatin     Her leg gave away on her and states thinks secondary to Simvastatin   Sulfa Antibiotics Nausea Only   Vigamox [Moxifloxacin]     rash   Welchol [Colesevelam Hcl]     ROS As per HPI  PE: Vitals with BMI 02/02/2021 11/25/2020 11/23/2020  Height  5\' 0"  5\' 0"  -  Weight 132 lbs 133 lbs 132 lbs  BMI 85.02 77.41 -  Systolic 287 867 672  Diastolic 66 68 76  Pulse 73 84 77     Gen: Alert, well appearing.  Patient is oriented to person, place, time, and situation. AFFECT: pleasant, lucid thought and speech. CV: RRR, occ ectopy noted, no m/r/g.   LUNGS: CTA bilat, nonlabored resps, good aeration in all lung fields. EXT: no clubbing or cyanosis.  no edema.  Skin: Right shin with approximately 1 cm hyperkeratotic nodule. She has scattered splotches of pinkish macules with very fine flaky texture. No excoriations, no vesicles or pustules.  Lesions range from a centimeter to 2 to 3 cm in size.  Irregular shapes.  Nontender.  LABS:  Lab Results  Component Value Date   TSH 1.57 08/17/2020   Lab Results  Component Value Date   WBC 5.7 08/17/2020   HGB 14.8 08/17/2020   HCT 45.0 08/17/2020   MCV 96.6 08/17/2020   PLT 184 08/17/2020   Lab Results  Component Value Date   VITAMINB12 314 08/17/2020   Lab Results  Component Value Date   CREATININE 0.82 08/17/2020   BUN 16 08/17/2020   NA 135 08/17/2020   K 4.4 08/17/2020   CL 100 08/17/2020   CO2 21 08/17/2020   Lab Results  Component Value Date   ALT 13 08/17/2020   AST 13 08/17/2020   ALKPHOS 65 09/29/2019   BILITOT 0.6 08/17/2020   Lab Results  Component Value Date   CHOL 234 (H) 09/29/2019   Lab Results  Component Value Date   HDL 72 09/29/2019   Lab Results  Component Value Date   LDLCALC 146 (H) 09/29/2019   Lab Results  Component Value Date   TRIG 95  09/29/2019   Lab Results  Component Value Date   CHOLHDL 3.3 09/29/2019   Lab Results  Component Value Date   HGBA1C  09/03/2009    5.4 (NOTE)                                                                       According to the ADA Clinical Practice Recommendations for 2011, when HbA1c is used as a screening test:   >=6.5%   Diagnostic of Diabetes Mellitus           (if abnormal result  is confirmed)  5.7-6.4%   Increased risk of developing Diabetes Mellitus  References:Diagnosis and Classification of Diabetes Mellitus,Diabetes CNOB,0962,83(MOQHU 1):S62-S69 and Standards of Medical Care in         Diabetes - 2011,Diabetes Care,2011,34  (Suppl 1):S11-S61.   IMPRESSION AND PLAN:  #1 right shin nodule.  My shave excision path on 10/17/2020 was well differentiated squamous cell carcinoma.  This has recurred and she has appointment with Derm surgery in a couple weeks.  2.  Patchy dermatitis.  This seemed to have onset after I did her shave biopsy but I cannot see how 1 led to the other.  Reassured her today.  Continue fluticasone twice daily to affected areas.  Offered referral to allergist to see if they felt like anything may be causing this from an allergy standpoint.  She declined at this time but will  reconsider when I see her back in 3 months.  3.  Lower extremity edema.  Chronic venous insufficiency.  No problem with this lately. Continue low-sodium diet, periodic elevation.  #4 hypertension. signif white coat component historically and presently.  No home monitoring b/c this only makes her more anxious. Continue Toprol-XL 25/day, enalapril 20 mg/day, and amlodipine 5 mg a day. Electrolytes and creatinine today.  5.  Coronary artery disease, history of stenting.  Asymptomatic.  Continue beta-blocker.  She has stopped her aspirin ever since having prolonged bleeding from her shave biopsy site back in August.  Encouraged her to restart this and she said she will after her upcoming skin  surgery in a couple weeks.  #6 preventative health care.   Flu vaccine today. Tdap rx to pharmacy  An After Visit Summary was printed and given to the patient.  FOLLOW UP: Return in about 3 months (around 05/05/2021) for routine chronic illness f/u.  Signed:  Crissie Sickles, MD           02/02/2021

## 2021-02-20 ENCOUNTER — Other Ambulatory Visit (HOSPITAL_BASED_OUTPATIENT_CLINIC_OR_DEPARTMENT_OTHER): Payer: Self-pay | Admitting: Cardiovascular Disease

## 2021-03-01 ENCOUNTER — Other Ambulatory Visit: Payer: Self-pay

## 2021-03-01 ENCOUNTER — Encounter: Payer: Self-pay | Admitting: Podiatry

## 2021-03-01 ENCOUNTER — Ambulatory Visit (INDEPENDENT_AMBULATORY_CARE_PROVIDER_SITE_OTHER): Payer: Medicare Other | Admitting: Podiatry

## 2021-03-01 DIAGNOSIS — M79675 Pain in left toe(s): Secondary | ICD-10-CM | POA: Diagnosis not present

## 2021-03-01 DIAGNOSIS — G629 Polyneuropathy, unspecified: Secondary | ICD-10-CM | POA: Diagnosis not present

## 2021-03-01 DIAGNOSIS — L84 Corns and callosities: Secondary | ICD-10-CM | POA: Diagnosis not present

## 2021-03-01 DIAGNOSIS — B351 Tinea unguium: Secondary | ICD-10-CM

## 2021-03-01 DIAGNOSIS — M79674 Pain in right toe(s): Secondary | ICD-10-CM | POA: Diagnosis not present

## 2021-03-05 NOTE — Progress Notes (Signed)
°  Subjective:  Patient ID: Jennifer Peck, female    DOB: [birth year],  MRN: [id number]  Jennifer Peck presents to clinic today for at risk foot care with history of peripheral neuropathy and corn(s) b/l feet , callus(es) right foot and painful mycotic nails.  Pain interferes with ambulation. Aggravating factors include wearing enclosed shoe gear. Painful toenails interfere with ambulation. Aggravating factors include wearing enclosed shoe gear. Pain is relieved with periodic professional debridement. Painful corns and calluses are aggravated when weightbearing with and without shoegear. Pain is relieved with periodic professional debridement.  Her son is present during today's visit.  PCP is McGowen, Jennifer Blackwater, MD , and last visit was 02/02/2021.  Allergies  Allergen Reactions   Chlorthalidone     hyponatremia   Atenolol     Caused fatigue    Besivance [Besifloxacin Hcl]     rash   Codeine Nausea And Vomiting   Crestor [Rosuvastatin]     "leg gave away"   Gabapentin     Memory loss and confusion   Lipitor [Atorvastatin] Other (See Comments)    Severe leg pain   Lovastatin     myalgia   Simvastatin     Her leg gave away on her and states thinks secondary to Simvastatin   Sulfa Antibiotics Nausea Only   Vigamox [Moxifloxacin]     rash   Welchol [Colesevelam Hcl]     Review of Systems: Negative except as noted in the HPI. Objective:   Constitutional Jennifer Peck is a pleasant 87 y.o. Caucasian female, frail, in NAD. AAO x 3.   Vascular CFT <3 seconds b/l LE. Faintly palpable DP pulses b/l LE. Faintly palpable PT pulse(s) b/l LE. Pedal hair absent. No pain with calf compression b/l. Lower extremity skin temperature gradient warm to cool. No edema noted b/l LE. Evidence of chronic venous insufficiency b/l LE. No cyanosis or clubbing noted b/l LE.  Neurologic Normal speech. Oriented to person, place, and time. Protective sensation diminished with 10g monofilament b/l.  Dermatologic  Pedal integument with normal turgor, texture and tone BLE. Toenails 1-5 b/l elongated, discolored, dystrophic, thickened, crumbly with subungual debris and tenderness to dorsal palpation. Hyperkeratotic lesion(s) L 2nd toe, R 3rd toe, and submet head 5 right foot.  No erythema, no edema, no drainage, no fluctuance.  Orthopedic: Noted disuse atrophy bilaterally. No pain, crepitus or joint limitation noted with ROM bilateral LE. Hammertoe(s) noted to the L 2nd toe, L 3rd toe, L 4th toe, R 2nd toe, R 3rd toe, and R 4th toe.   Radiographs: None   Assessment:   1. Pain due to onychomycosis of toenails of both feet   2. Corns and callosities   3. Neuropathy    Plan:  Patient was evaluated and treated and all questions answered. Consent given for treatment as described below: -Mycotic toenails 1-5 bilaterally were debrided in length and girth with sterile nail nippers and dremel without incident. -Corn(s) L 2nd toe and R 3rd toe and callus(es) submet head 5 right foot were pared utilizing sterile scalpel blade without incident. Total number debrided =3. -Patient/POA to call should there be question/concern in the interim.  Return in about 3 months (around 05/30/2021).  Marzetta Board, DPM

## 2021-05-04 ENCOUNTER — Other Ambulatory Visit: Payer: Self-pay

## 2021-05-04 ENCOUNTER — Ambulatory Visit (INDEPENDENT_AMBULATORY_CARE_PROVIDER_SITE_OTHER): Payer: Medicare Other | Admitting: Family Medicine

## 2021-05-04 ENCOUNTER — Encounter: Payer: Self-pay | Admitting: Family Medicine

## 2021-05-04 VITALS — BP 108/69 | HR 63 | Temp 97.4°F | Ht 60.0 in | Wt 134.6 lb

## 2021-05-04 DIAGNOSIS — I10 Essential (primary) hypertension: Secondary | ICD-10-CM

## 2021-05-04 DIAGNOSIS — R21 Rash and other nonspecific skin eruption: Secondary | ICD-10-CM | POA: Diagnosis not present

## 2021-05-04 DIAGNOSIS — L299 Pruritus, unspecified: Secondary | ICD-10-CM

## 2021-05-04 LAB — COMPREHENSIVE METABOLIC PANEL
ALT: 13 U/L (ref 0–35)
AST: 11 U/L (ref 0–37)
Albumin: 4.7 g/dL (ref 3.5–5.2)
Alkaline Phosphatase: 62 U/L (ref 39–117)
BUN: 25 mg/dL — ABNORMAL HIGH (ref 6–23)
CO2: 32 mEq/L (ref 19–32)
Calcium: 9.9 mg/dL (ref 8.4–10.5)
Chloride: 99 mEq/L (ref 96–112)
Creatinine, Ser: 0.81 mg/dL (ref 0.40–1.20)
GFR: 61.25 mL/min (ref >60.00)
Glucose, Bld: 110 mg/dL — ABNORMAL HIGH (ref 70–99)
Potassium: 4.4 mEq/L (ref 3.5–5.1)
Sodium: 136 mEq/L (ref 135–145)
Total Bilirubin: 0.6 mg/dL (ref 0.2–1.2)
Total Protein: 7.3 g/dL (ref 6.0–8.3)

## 2021-05-04 LAB — CBC WITH DIFFERENTIAL/PLATELET
Basophils Absolute: 0 10*3/uL (ref 0.0–0.1)
Basophils Relative: 0.4 % (ref 0.0–3.0)
Eosinophils Absolute: 0.1 10*3/uL (ref 0.0–0.7)
Eosinophils Relative: 1.5 % (ref 0.0–5.0)
HCT: 43.3 % (ref 36.0–46.0)
Hemoglobin: 14.2 g/dL (ref 12.0–15.0)
Lymphocytes Relative: 39 % (ref 12.0–46.0)
Lymphs Abs: 2.2 10*3/uL (ref 0.7–4.0)
MCHC: 32.8 g/dL (ref 30.0–36.0)
MCV: 94.3 fl (ref 78.0–100.0)
Monocytes Absolute: 0.7 10*3/uL (ref 0.1–1.0)
Monocytes Relative: 12 % (ref 3.0–12.0)
Neutro Abs: 2.7 10*3/uL (ref 1.4–7.7)
Neutrophils Relative %: 47.1 % (ref 43.0–77.0)
Platelets: 177 10*3/uL (ref 150.0–400.0)
RBC: 4.59 Mil/uL (ref 3.87–5.11)
RDW: 13.5 % (ref 11.5–15.5)
WBC: 5.7 10*3/uL (ref 4.0–10.5)

## 2021-05-04 LAB — SEDIMENTATION RATE: Sed Rate: 40 mm/hr — ABNORMAL HIGH (ref 0–30)

## 2021-05-04 LAB — C-REACTIVE PROTEIN: CRP: <1 mg/dL (ref 0.5–20.0)

## 2021-05-04 MED ORDER — FEXOFENADINE HCL 60 MG PO TABS: ORAL_TABLET | ORAL | 3 refills | Status: DC

## 2021-05-04 NOTE — Progress Notes (Addendum)
OFFICE VISIT  05/04/2021  CC:  Chief Complaint  Patient presents with   Follow-up    RCI; pt is not fasting    Patient is a 88 y.o. female who presents for 3 mo f/u HTN, lower extremity edema d/t venous insufficiency, and CAD. A/P as of last visit: "#1 right shin nodule.  My shave excision path on 10/17/2020 was well differentiated squamous cell carcinoma.  This has recurred and she has appointment with Derm surgery in a couple weeks.  2.  Patchy dermatitis.  This seemed to have onset after I did her shave biopsy but I cannot see how 1 led to the other.  Reassured her today.  Continue fluticasone twice daily to affected areas.  Offered referral to allergist to see if they felt like anything may be causing this from an allergy standpoint.  She declined at this time but will reconsider when I see her back in 3 months.  3.  Lower extremity edema.  Chronic venous insufficiency.  No problem with this lately. Continue low-sodium diet, periodic elevation.   #4 hypertension. signif white coat component historically and presently.  No home monitoring b/c this only makes her more anxious. Continue Toprol-XL 25/day, enalapril 20 mg/day, and amlodipine 5 mg a day. Electrolytes and creatinine today.  5.  Coronary artery disease, history of stenting.  Asymptomatic.  Continue beta-blocker.  She has stopped her aspirin ever since having prolonged bleeding from her shave biopsy site back in August.  Encouraged her to restart this and she said she will after her upcoming skin surgery in a couple weeks.   #6 preventative health care.   Flu vaccine today. Tdap rx to pharmacy"  INTERIM HX: Still with significant problem of itchy skin. Has frequently occurring small pink spots on the skin that sometimes are flat and sometimes slightly nodular.  They often evolved into a something with a central dry crust but never opened up and it was anything.  They itch a lot.  There are some on the anterior surface of the  lower legs and she has tenderness all over these areas but it is hard to tell if the tenderness was there before the spots.  Areas involved include the legs arms and face. No fatigue, dizziness, fevers, weight loss, or other systemic symptoms. Not taking antihistamine b/c zyrtec made her "fuzzy" in the past.  Recently saw skin surgery center to get her right lower leg lesion excised and this is doing well. They did not address anything to do with her other skin complaints.  Restarted ASA yet---only a couple days a week b/c of her fear of excessive bleeding.  ROS as above, plus--> nno CP, no SOB, no wheezing, no cough, no dizziness, no HAs,  no melena/hematochezia.  No polyuria or polydipsia.  No myalgias or arthralgias.  No focal weakness, paresthesias, or tremors.  No acute vision or hearing abnormalities.  No dysuria or unusual/new urinary urgency or frequency.  No recent changes in lower legs. No n/v/d or abd pain.  No palpitations.    Past Medical History:  Diagnosis Date   Allergic state 04/30/2011   Anxiety    BCC (basal cell carcinoma), face 11/06/2010   Bilateral bunions 11/06/2010   Chronic venous insufficiency    lymphedema + venous stasis pigmentation changes   Constipation 11/06/2010   Coronary artery disease 2011   BMS x 3   Diastolic heart failure (HCC)    GERD (gastroesophageal reflux disease) 05/11/2012   History of hyperkalemia 01/23/2011  Hyperlipidemia    Intolerant of statins and zetia.  Refused to consider PCSK9 inhibitor initially but as of 02/2017 cardiol f/u she would consider repatha if approved.   Hypertension    +white coat component   Lesion of labia 02/04/2012   Melanoma in situ [facility]) 08/2016   Derm= Dr. Renda Rolls @ [facility] in [place].   Mitral regurgitation 12/2020   moderate by echo 12/2020   Osteoarthritis of both knees    R>L   Osteopenia 11/23/2010; 02/2016   2017 T-score -1.9.  Repeat DEXA 2 yrs.   Peripheral neuropathy 11/06/2010    Small fiber, non-diabetic per neurologist   Right lumbar radiculopathy 01/2015   Improved with PT   SCC (squamous cell carcinoma), arm 11/06/2010   arm 2012.  R lower leg 10/2020   Seborrheic keratosis 04/30/2011   Vertigo     Past Surgical History:  Procedure Laterality Date   CARDIAC CATHETERIZATION  12/20/2009   3 bare metal stents   DEXA  03/05/2016   T-score -1.9   EYE SURGERY     cataract- left eye 09-24-11   SKIN BIOPSY  08/2012   Shave excision of lesion on pt's back: irritated seb keratosis.   TRANSTHORACIC ECHOCARDIOGRAM  12/05/2009   Mild LVH, normal systolic fxn, mild impaired relaxation.  No signif valvular dz. 12/01/20->EF 60-65%, grd II DD, mod MR, +mild pulm art htn/   TUBAL LIGATION      Outpatient Medications Prior to Visit  Medication Sig Dispense Refill   amLODipine (NORVASC) 5 MG tablet Take 1 tablet (5 mg total) by mouth daily. 90 tablet 3   aspirin 81 MG tablet Take 81 mg by mouth daily.     azelastine (OPTIVAR) 0.05 % ophthalmic solution INSTILL TWO DROPS INTO EACH EYE TWICE DAILY 6 mL 12   B Complex Vitamins (B COMPLEX-B12 PO) Take by mouth daily.     Coenzyme Q10 (COQ10 PO) Take by mouth once a week.     enalapril (VASOTEC) 20 MG tablet TAKE 1 TABLET BY MOUTH IN THE MORNING AND 1/2 (ONE-HALF) AT BEDTIME 135 tablet 2   fluocinonide (LIDEX) 0.05 % external solution Apply 1 application topically daily as needed.     fluticasone (CUTIVATE) 0.05 % cream Apply to affected area of itchy skin bid as needed 60 g 2   magnesium oxide (MAG-OX) 400 MG tablet Take 400 mg by mouth once a week.     metoprolol succinate (TOPROL-XL) 25 MG 24 hr tablet TAKE 1 TABLET BY MOUTH EVERY DAY 90 tablet 3   Multiple Vitamin (MULTIVITAMIN) tablet Take 1 tablet by mouth once a week. Takes occ     Naftifine HCl (NAFTIN) 2 % GEL Apply to affected area qd-bid 60 g 3   Omega-3 Fatty Acids (FISH OIL PO) Take 1 capsule by mouth once a week.     No facility-administered medications prior  to visit.    Allergies  Allergen Reactions   Chlorthalidone     hyponatremia   Atenolol     Caused fatigue    Besivance [Besifloxacin Hcl]     rash   Codeine Nausea And Vomiting   Crestor [Rosuvastatin]     "leg gave away"   Gabapentin     Memory loss and confusion   Lipitor [Atorvastatin] Other (See Comments)    Severe leg pain   Lovastatin     myalgia   Simvastatin     Her leg gave away on her and states thinks secondary to Simvastatin  Sulfa Antibiotics Nausea Only   Vigamox [Moxifloxacin]     rash   Welchol [Colesevelam Hcl]     ROS As per HPI  PE: Vitals with BMI 05/04/2021 02/02/2021 11/25/2020  Height 5\' 0"  5\' 0"  5\' 0"   Weight 134 lbs 10 oz 132 lbs 133 lbs  BMI 26.29 02.58 52.77  Systolic 824 235 361  Diastolic 69 66 68  Pulse 63 73 84     Physical Exam  General: Alert and well-appearing, pleasant, lucid thought and speech. Cardiovascular: Regular rhythm and rate, occasional ectopy, no murmur rub or gallop. Lungs are clear, nonlabored respirations. Extremities show no pitting edema.  She has some mild doughy, nonpitting edema in both lower legs.  Her pretibial surfaces are significantly tender to palpation, but I feel no nodules or fluctuant areas. Skin: Scattered pinkish macules and small papules, some with central dried crusted area.  No pustules or vesicles or nodules.  She has some petechiae on bilateral pretibial surfaces and a few on her calves.  A couple of the small ecchymoses on her right ankle. Face: Pinkish discoloration on medial cheeks and under eyes medially.  LABS:  Last CBC Lab Results  Component Value Date   WBC 5.7 08/17/2020   HGB 14.8 08/17/2020   HCT 45.0 08/17/2020   MCV 96.6 08/17/2020   MCH 31.8 08/17/2020   RDW 12.6 08/17/2020   PLT 184 44/31/5400   Last metabolic panel Lab Results  Component Value Date   GLUCOSE 97 02/02/2021   NA 132 (L) 02/02/2021   K 4.1 02/02/2021   CL 97 02/02/2021   CO2 27 02/02/2021   BUN 19  02/02/2021   CREATININE 0.71 02/02/2021   GFRNONAA 73 09/29/2019   CALCIUM 9.5 02/02/2021   PHOS 3.5 05/07/2012   PROT 7.0 08/17/2020   ALBUMIN 4.5 09/29/2019   LABGLOB 2.8 09/29/2019   AGRATIO 1.6 09/29/2019   BILITOT 0.6 08/17/2020   ALKPHOS 65 09/29/2019   AST 13 08/17/2020   ALT 13 08/17/2020   ANIONGAP 11 05/02/2015   Last lipids Lab Results  Component Value Date   CHOL 234 (H) 09/29/2019   HDL 72 09/29/2019   LDLCALC 146 (H) 09/29/2019   LDLDIRECT 128.0 12/02/2012   TRIG 95 09/29/2019   CHOLHDL 3.3 09/29/2019   Last hemoglobin A1c Lab Results  Component Value Date   HGBA1C  09/03/2009    5.4 (NOTE)                                                                       According to the ADA Clinical Practice Recommendations for 2011, when HbA1c is used as a screening test:   >=6.5%   Diagnostic of Diabetes Mellitus           (if abnormal result  is confirmed)  5.7-6.4%   Increased risk of developing Diabetes Mellitus  References:Diagnosis and Classification of Diabetes Mellitus,Diabetes QQPY,1950,93(OIZTI 1):S62-S69 and Standards of Medical Care in         Diabetes - 2011,Diabetes Care,2011,34  (Suppl 1):S11-S61.   Last thyroid functions Lab Results  Component Value Date   TSH 1.57 08/17/2020   IMPRESSION AND PLAN:  #1 hypertension, well controlled on amlodipine 5 mg a day and enalapril 20 mg  every morning and 10 mg every afternoon as well as Toprol-XL 25/day. Electrolytes and creatinine today.  2.  Pruritic rash. Admittedly, many of the spots that she sees on her skin I cannot see anything abnormal. She does have several lesions on extremities that I do note as described in my exam. I am hesitant to do a biopsy due to her past history of some bleeding that scared her when I removed a skin lesion. Question malar rash noted today but pt states this has not been present until today b/c she scrubbed her face firmly this morning. Trial of allegra 60 bid. She will make  appointment with her dermatologist, Dr. Renda Rolls. We will check cbc, sed rate and CRP today.  #3 chronic lower extremity edema: No pitting edema. This is stable.  No meds  #4 coronary artery disease. She has restarted her aspirin a couple days a week, fearful that this will cause too much bleeding. Continue beta-blocker.  History of statin intolerance and does not want trial of any further cholesterol-lowering medication.  An After Visit Summary was printed and given to the patient.  FOLLOW UP: Return in about 3 months (around 08/01/2021) for routine chronic illness f/u.  Signed:  Crissie Sickles, MD           05/04/2021

## 2021-05-17 ENCOUNTER — Other Ambulatory Visit: Payer: Self-pay

## 2021-05-17 ENCOUNTER — Ambulatory Visit (INDEPENDENT_AMBULATORY_CARE_PROVIDER_SITE_OTHER): Payer: Medicare Other

## 2021-05-17 VITALS — BP 110/70 | HR 73 | Temp 97.5°F | Wt 134.4 lb

## 2021-05-17 DIAGNOSIS — Z0000 Encounter for general adult medical examination without abnormal findings: Secondary | ICD-10-CM

## 2021-05-17 NOTE — Patient Instructions (Signed)
Jennifer Peck , Thank you for taking time to come for your Medicare Wellness Visit. I appreciate your ongoing commitment to your health goals. Please review the following plan we discussed and let me know if I can assist you in the future.   Screening recommendations/referrals: Colonoscopy: No longer required  Mammogram: No longer required  Bone Density: Done 03/06/16 Recommended yearly ophthalmology/optometry visit for glaucoma screening and checkup Recommended yearly dental visit for hygiene and checkup  Vaccinations: Influenza vaccine: Done 02/02/21 repeat every year  Pneumococcal vaccine: Up to date Tdap vaccine: Completed 02/02/21 Shingles vaccine: Completed 6/25, 11/15/17   Covid-19:Completed 1/14, 2/15, 01/28/20, & 4/28, 01/18/21  Advanced directives: Please bring a copy of your health care power of attorney and living will to the office at your convenience.   Conditions/risks identified: Continue to do exercise 3 times a week  Next appointment: Follow up in one year for your annual wellness visit    Preventive Care 65 Years and Older, Female Preventive care refers to lifestyle choices and visits with your health care provider that can promote health and wellness. What does preventive care include? A yearly physical exam. This is also called an annual well check. Dental exams once or twice a year. Routine eye exams. Ask your health care provider how often you should have your eyes checked. Personal lifestyle choices, including: Daily care of your teeth and gums. Regular physical activity. Eating a healthy diet. Avoiding tobacco and drug use. Limiting alcohol use. Practicing safe sex. Taking low-dose aspirin every day. Taking vitamin and mineral supplements as recommended by your health care provider. What happens during an annual well check? The services and screenings done by your health care provider during your annual well check will depend on your age, overall health,  lifestyle risk factors, and family history of disease. Counseling  Your health care provider may ask you questions about your: Alcohol use. Tobacco use. Drug use. Emotional well-being. Home and relationship well-being. Sexual activity. Eating habits. History of falls. Memory and ability to understand (cognition). Work and work Statistician. Reproductive health. Screening  You may have the following tests or measurements: Height, weight, and BMI. Blood pressure. Lipid and cholesterol levels. These may be checked every 5 years, or more frequently if you are over 7 years old. Skin check. Lung cancer screening. You may have this screening every year starting at age 66 if you have a 30-pack-year history of smoking and currently smoke or have quit within the past 15 years. Fecal occult blood test (FOBT) of the stool. You may have this test every year starting at age 41. Flexible sigmoidoscopy or colonoscopy. You may have a sigmoidoscopy every 5 years or a colonoscopy every 10 years starting at age 29. Hepatitis C blood test. Hepatitis B blood test. Sexually transmitted disease (STD) testing. Diabetes screening. This is done by checking your blood sugar (glucose) after you have not eaten for a while (fasting). You may have this done every 1-3 years. Bone density scan. This is done to screen for osteoporosis. You may have this done starting at age 42. Mammogram. This may be done every 1-2 years. Talk to your health care provider about how often you should have regular mammograms. Talk with your health care provider about your test results, treatment options, and if necessary, the need for more tests. Vaccines  Your health care provider may recommend certain vaccines, such as: Influenza vaccine. This is recommended every year. Tetanus, diphtheria, and acellular pertussis (Tdap, Td) vaccine. You may need  a Td booster every 10 years. Zoster vaccine. You may need this after age  86. Pneumococcal 13-valent conjugate (PCV13) vaccine. One dose is recommended after age 30. Pneumococcal polysaccharide (PPSV23) vaccine. One dose is recommended after age 50. Talk to your health care provider about which screenings and vaccines you need and how often you need them. This information is not intended to replace advice given to you by your health care provider. Make sure you discuss any questions you have with your health care provider. Document Released: 04/01/2015 Document Revised: 11/23/2015 Document Reviewed: 01/04/2015 Elsevier Interactive Patient Education  2017 [employer] Prevention in the Home Falls can cause injuries. They can happen to people of all ages. There are many things you can do to make your home safe and to help prevent falls. What can I do on the outside of my home? Regularly fix the edges of walkways and driveways and fix any cracks. Remove anything that might make you trip as you walk through a door, such as a raised step or threshold. Trim any bushes or trees on the path to your home. Use bright outdoor lighting. Clear any walking paths of anything that might make someone trip, such as rocks or tools. Regularly check to see if handrails are loose or broken. Make sure that both sides of any steps have handrails. Any raised decks and porches should have guardrails on the edges. Have any leaves, snow, or ice cleared regularly. Use sand or salt on walking paths during winter. Clean up any spills in your garage right away. This includes oil or grease spills. What can I do in the bathroom? Use night lights. Install grab bars by the toilet and in the tub and shower. Do not use towel bars as grab bars. Use non-skid mats or decals in the tub or shower. If you need to sit down in the shower, use a plastic, non-slip stool. Keep the floor dry. Clean up any water that spills on the floor as soon as it happens. Remove soap buildup in the tub or shower  regularly. Attach bath mats securely with double-sided non-slip rug tape. Do not have throw rugs and other things on the floor that can make you trip. What can I do in the bedroom? Use night lights. Make sure that you have a light by your bed that is easy to reach. Do not use any sheets or blankets that are too big for your bed. They should not hang down onto the floor. Have a firm chair that has side arms. You can use this for support while you get dressed. Do not have throw rugs and other things on the floor that can make you trip. What can I do in the kitchen? Clean up any spills right away. Avoid walking on wet floors. Keep items that you use a lot in easy-to-reach places. If you need to reach something above you, use a strong step stool that has a grab bar. Keep electrical cords out of the way. Do not use floor polish or wax that makes floors slippery. If you must use wax, use non-skid floor wax. Do not have throw rugs and other things on the floor that can make you trip. What can I do with my stairs? Do not leave any items on the stairs. Make sure that there are handrails on both sides of the stairs and use them. Fix handrails that are broken or loose. Make sure that handrails are as long as the stairways. Check  any carpeting to make sure that it is firmly attached to the stairs. Fix any carpet that is loose or worn. Avoid having throw rugs at the top or bottom of the stairs. If you do have throw rugs, attach them to the floor with carpet tape. Make sure that you have a light switch at the top of the stairs and the bottom of the stairs. If you do not have them, ask someone to add them for you. What else can I do to help prevent falls? Wear shoes that: Do not have high heels. Have rubber bottoms. Are comfortable and fit you well. Are closed at the toe. Do not wear sandals. If you use a stepladder: Make sure that it is fully opened. Do not climb a closed stepladder. Make sure that  both sides of the stepladder are locked into place. Ask someone to hold it for you, if possible. Clearly mark and make sure that you can see: Any grab bars or handrails. First and last steps. Where the edge of each step is. Use tools that help you move around (mobility aids) if they are needed. These include: Canes. Walkers. Scooters. Crutches. Turn on the lights when you go into a dark area. Replace any light bulbs as soon as they burn out. Set up your furniture so you have a clear path. Avoid moving your furniture around. If any of your floors are uneven, fix them. If there are any pets around you, be aware of where they are. Review your medicines with your doctor. Some medicines can make you feel dizzy. This can increase your chance of falling. Ask your doctor what other things that you can do to help prevent falls. This information is not intended to replace advice given to you by your health care provider. Make sure you discuss any questions you have with your health care provider. Document Released: 12/30/2008 Document Revised: 08/11/2015 Document Reviewed: 04/09/2014 Elsevier Interactive Patient Education  2017 [employer].

## 2021-05-17 NOTE — Progress Notes (Signed)
Willette Brace, LPN   Subjective:   Jennifer Peck is a 88 y.o. female who presents for Medicare Annual (Subsequent) preventive examination.  Review of Systems     Cardiac Risk Factors include: advanced age (>36men, >65 women);dyslipidemia;hypertension     Objective:    Today's Vitals   05/17/21 0941  BP: 110/70  Pulse: 73  Temp: (!) 97.5 F (36.4 C)  SpO2: 99%  Weight: 134 lb 6.4 oz (61 kg)   Body mass index is 26.25 kg/m.  Advanced Directives 05/17/2021 02/17/2020 01/08/2019 05/06/2017 04/30/2016 05/02/2015  Does Patient Have a Medical Advance Directive? Yes Yes Yes Yes Yes No  Type of Academic librarian Living will [place];Living will Living will;Healthcare Power of Attorney Living will -  Copy of [place] in Chart? No - copy requested - No - copy requested No - copy requested - -    Current Medications (verified) Outpatient Encounter Medications as of 05/17/2021  Medication Sig   amLODipine (NORVASC) 5 MG tablet Take 1 tablet (5 mg total) by mouth daily.   azelastine (OPTIVAR) 0.05 % ophthalmic solution INSTILL TWO DROPS INTO EACH EYE TWICE DAILY   B Complex Vitamins (B COMPLEX-B12 PO) Take by mouth daily.   Coenzyme Q10 (COQ10 PO) Take by mouth once a week.   enalapril (VASOTEC) 20 MG tablet TAKE 1 TABLET BY MOUTH IN THE MORNING AND 1/2 (ONE-HALF) AT BEDTIME   fexofenadine (ALLEGRA) 60 MG tablet 1 tab po bid for itching   fluocinonide (LIDEX) 0.05 % external solution Apply 1 application topically daily as needed.   fluticasone (CUTIVATE) 0.05 % cream Apply to affected area of itchy skin bid as needed   magnesium oxide (MAG-OX) 400 MG tablet Take 400 mg by mouth once a week.   metoprolol succinate (TOPROL-XL) 25 MG 24 hr tablet TAKE 1 TABLET BY MOUTH EVERY DAY   Multiple Vitamin (MULTIVITAMIN) tablet Take 1 tablet by mouth once a week. Takes occ   Naftifine HCl (NAFTIN) 2 % GEL Apply to affected area  qd-bid   Omega-3 Fatty Acids (FISH OIL PO) Take 1 capsule by mouth once a week.   aspirin 81 MG tablet Take 81 mg by mouth daily. 3 days a week   No facility-administered encounter medications on file as of 05/17/2021.    Allergies (verified) Chlorthalidone, Atenolol, Besivance [besifloxacin hcl], Codeine, Crestor [rosuvastatin], Gabapentin, Lipitor [atorvastatin], Lovastatin, Simvastatin, Sulfa antibiotics, Vigamox [moxifloxacin], and Welchol [colesevelam hcl]   History: Past Medical History:  Diagnosis Date   Allergic state 04/30/2011   Anxiety    BCC (basal cell carcinoma), face 11/06/2010   Bilateral bunions 11/06/2010   Chronic venous insufficiency    lymphedema + venous stasis pigmentation changes   Constipation 11/06/2010   Coronary artery disease 2011   BMS x 3   Diastolic heart failure (HCC)    GERD (gastroesophageal reflux disease) 05/11/2012   History of hyperkalemia 01/23/2011   Hyperlipidemia    Intolerant of statins and zetia.  Refused to consider PCSK9 inhibitor initially but as of 02/2017 cardiol f/u she would consider repatha if approved.   Hypertension    +white coat component   Lesion of labia 02/04/2012   Melanoma in situ [facility]) 08/2016   Derm= Dr. Renda Rolls @ [facility] in [place].   Mitral regurgitation 12/2020   moderate by echo 12/2020   Osteoarthritis of both knees    R>L   Osteopenia 11/23/2010; 02/2016   2017 T-score -1.9.  Repeat DEXA 2 yrs.   Peripheral neuropathy 11/06/2010   Small fiber, non-diabetic per neurologist   Right lumbar radiculopathy 01/2015   Improved with PT   SCC (squamous cell carcinoma), arm 11/06/2010   arm 2012.  R lower leg 10/2020   Seborrheic keratosis 04/30/2011   Vertigo    Past Surgical History:  Procedure Laterality Date   CARDIAC CATHETERIZATION  12/20/2009   3 bare metal stents   DEXA  03/05/2016   T-score -1.9   EYE SURGERY     cataract- left eye 09-24-11   SKIN BIOPSY  08/2012   Shave excision of  lesion on pt's back: irritated seb keratosis.   TRANSTHORACIC ECHOCARDIOGRAM  12/05/2009   Mild LVH, normal systolic fxn, mild impaired relaxation.  No signif valvular dz. 12/01/20->EF 60-65%, grd II DD, mod MR, +mild pulm art htn/   TUBAL LIGATION     Family History  Problem Relation Age of Onset   Stroke Mother    Heart disease Father    Hypertension Daughter    Hyperlipidemia Daughter    Hypertension Son    Cancer Daughter        rectal/ chemo and radiation   Hypertension Daughter    Hypertension Son    Hyperlipidemia Son    Hypertension Son    Heart disease Brother    Vision loss Maternal Grandfather    Stroke Paternal Grandmother    Asthma Paternal Grandfather    Social History   Socioeconomic History   Marital status: Single    Spouse name: Not on file   Number of children: Not on file   Years of education: Not on file   Highest education level: Not on file  Occupational History   Not on file  Tobacco Use   Smoking status: Former    Types: Cigarettes    Quit date: 09/04/1977    Years since quitting: 43.7   Smokeless tobacco: Never  Vaping Use   Vaping Use: Never used  Substance and Sexual Activity   Alcohol use: Yes    Comment: only on Holidays   Drug use: No   Sexual activity: Not on file  Other Topics Concern   Not on file  Social History Narrative   Lives alone near [place].  Two daughters in [place], [place].   Widowed about 30 yrs.   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   Former smoker: approx 30 pack-yr hx, quit 1979.   Occasional social alcohol.   Exercises 3 days a week at [place].   Takes knitting classes and computer classes.   Social Determinants of Health   Financial Resource Strain: Low Risk    Difficulty of Paying Living Expenses: Not hard at all  Food Insecurity: No Food Insecurity   Worried About Charity fundraiser in the Last Year: Never true   [place] in the Last Year:  Never true  Transportation Needs: No Transportation Needs   Lack of Transportation (Medical): No   Lack of Transportation (Non-Medical): No  Physical Activity: Sufficiently Active   Days of Exercise per Week: 3 days   Minutes of Exercise per Session: 60 min  Stress: No Stress Concern Present   Feeling of Stress : Not at all  Social Connections: Socially Isolated   Frequency of Communication with Friends and Family: More than three times a week   Frequency of Social Gatherings with Friends and Family: More than three times a week  Attends Religious Services: Never   Active Member of Clubs or Organizations: No   Attends Archivist Meetings: Never   Marital Status: Widowed    Tobacco Counseling Counseling given: Not Answered   Clinical Intake:  Pre-visit preparation completed: Yes        BMI - recorded: 26.25 Nutritional Status: BMI 25 -29 Overweight Nutritional Risks: None Diabetes: No  How often do you need to have someone help you when you read instructions, pamphlets, or other written materials from your doctor or pharmacy?: 1 - Never  Diabetic?No  Interpreter Needed?: No  Information entered by :: Charlott Rakes, LPN   Activities of Daily Living In your present state of health, do you have any difficulty performing the following activities: 05/17/2021  Hearing? Y  Comment slight loss  Vision? N  Difficulty concentrating or making decisions? N  Walking or climbing stairs? N  Dressing or bathing? N  Doing errands, shopping? N  Preparing Food and eating ? N  Using the Toilet? N  In the past six months, have you accidently leaked urine? N  Do you have problems with loss of bowel control? N  Managing your Medications? N  Managing your Finances? N  Housekeeping or managing your Housekeeping? N  Some recent data might be hidden    Patient Care Team: Tammi Sou, MD as PCP - General (Family Medicine) Skeet Latch, MD as PCP - Cardiology  (Cardiology) Skeet Latch, MD as Consulting Physician (Cardiology) [place], Jennefer Bravo, MD as Referring Physician (Dermatology) Garrel Ridgel, [place] as Consulting Physician (Podiatry) Elouise Munroe, MD as Consulting Physician (Cardiology)  Indicate any recent Medical Services you may have received from other than [facility] providers in the past year (date may be approximate).     Assessment:   This is a routine wellness examination for [facility].  Hearing/Vision screen Hearing Screening - Comments:: Pt has slight hearing loss  Vision Screening - Comments:: Pt follows up with eye exams with Dr Delman Cheadle  Dietary issues and exercise activities discussed: Current Exercise Habits: Home exercise routine, Type of exercise: Other - see comments, Time (Minutes): > 60, Frequency (Times/Week): 3, Weekly Exercise (Minutes/Week): 0   Goals Addressed             This Visit's Progress    Patient Stated       Continue 3 times a week exercise        Depression Screen PHQ 2/9 Scores 05/17/2021 02/02/2021 02/17/2020 02/09/2020 01/08/2019 12/15/2018 05/06/2017  PHQ - 2 Score 0 0 0 0 0 0 0    Fall Risk Fall Risk  05/17/2021 02/02/2021 08/09/2020 02/17/2020 02/09/2020  Falls in the past year? 0 0 0 0 0  Number falls in past yr: 0 0 0 0 0  Injury with Fall? 0 0 0 0 0  Risk for fall due to : Impaired vision - - - -  Follow up Falls prevention discussed Falls evaluation completed - Falls prevention discussed -    FALL RISK PREVENTION PERTAINING TO THE HOME:  Any stairs in or around the home? Yes  If so, are there any without handrails? No  Home free of loose throw rugs in walkways, pet beds, electrical cords, etc? Yes  Adequate lighting in your home to reduce risk of falls? Yes   ASSISTIVE DEVICES UTILIZED TO PREVENT FALLS:  Life alert? No  Use of a cane, walker or w/c? No  Grab bars in the bathroom? Yes  Shower chair or bench in  shower? Yes  Elevated toilet seat or a handicapped toilet? No    TIMED UP AND GO:  Was the test performed? No .   Cognitive Function: MMSE - Mini Mental State Exam 04/30/2016  Orientation to time 5  Orientation to Place 5  Registration 3  Attention/ Calculation 5  Recall 3  Language- name 2 objects 2  Language- repeat 1  Language- follow 3 step command 3  Language- read & follow direction 1  Write a sentence 1  Copy design 1  Total score 30     6CIT Screen 05/17/2021  What Year? 0 points  What month? 0 points  What time? 0 points  Count back from 20 0 points  Months in reverse 0 points  Repeat phrase 0 points  Total Score 0    Immunizations Immunization History  Administered Date(s) Administered   Fluad Quad(high Dose 65+) 12/15/2018, 02/09/2020, 02/02/2021   Influenza Split 12/22/2010, 12/21/2011   Influenza Whole 02/15/2010   Influenza, High Dose Seasonal PF 02/01/2015, 01/02/2017, 01/28/2018   Influenza,inj,Quad PF,6+ Mos 12/02/2012, 01/21/2014   Influenza-Unspecified 01/18/2016   Moderna Covid-19 Vaccine Bivalent Booster 4yrs & up 01/18/2021   Moderna Sars-Covid-2 Vaccination 04/02/2019, 05/04/2019, 01/28/2020, 07/14/2020   Pneumococcal Conjugate-13 02/10/2014   Pneumococcal Polysaccharide-23 12/17/2012   Td 02/15/2010   Tdap 03/19/2010, 02/02/2021   Zoster Recombinat (Shingrix) 09/10/2017, 11/15/2017, 11/21/2017   Zoster, Live 03/20/2007    TDAP status: Up to date  Flu Vaccine status: Up to date  Pneumococcal vaccine status: Up to date  Covid-19 vaccine status: Completed vaccines  Qualifies for Shingles Vaccine? Yes   Zostavax completed Yes   Shingrix Completed?: Yes  Screening Tests Health Maintenance  Topic Date Due   TETANUS/TDAP  02/03/2031   Pneumonia Vaccine 56+ Years old  Completed   INFLUENZA VACCINE  Completed   DEXA SCAN  Completed   COVID-19 Vaccine  Completed   Zoster Vaccines- Shingrix  Completed   HPV VACCINES  Aged Out    Health Maintenance  There are no preventive care reminders to  display for this patient.  Colorectal cancer screening: No longer required.   Mammogram status: No longer required due to age.  Bone Density status: Completed 03/06/16. Results reflect: Bone density results: OSTEOPENIA. Repeat every 0 years.   Additional Screening:  Vision Screening: Recommended annual ophthalmology exams for early detection of glaucoma and other disorders of the eye. Is the patient up to date with their annual eye exam?  Yes  Who is the provider or what is the name of the office in which the patient attends annual eye exams? Dr Delman Cheadle  If pt is not established with a provider, would they like to be referred to a provider to establish care? No .   Dental Screening: Recommended annual dental exams for proper oral hygiene  Community Resource Referral / Chronic Care Management: CRR required this visit?  No   CCM required this visit?  No      Plan:     I have personally reviewed and noted the following in the patients chart:   Medical and social history Use of alcohol, tobacco or illicit drugs  Current medications and supplements including opioid prescriptions.  Functional ability and status Nutritional status Physical activity Advanced directives List of other physicians Hospitalizations, surgeries, and ER visits in previous 12 months Vitals Screenings to include cognitive, depression, and falls Referrals and appointments  In addition, I have reviewed and discussed with patient certain preventive protocols, quality metrics, and best  practice recommendations. A written personalized care plan for preventive services as well as general preventive health recommendations were provided to patient.     Willette Brace, LPN   09/21/7009   Nurse Notes: None

## 2021-05-22 ENCOUNTER — Other Ambulatory Visit: Payer: Self-pay | Admitting: Family Medicine

## 2021-06-05 ENCOUNTER — Ambulatory Visit (INDEPENDENT_AMBULATORY_CARE_PROVIDER_SITE_OTHER): Payer: Medicare Other | Admitting: Podiatry

## 2021-06-05 ENCOUNTER — Encounter: Payer: Self-pay | Admitting: Podiatry

## 2021-06-05 ENCOUNTER — Other Ambulatory Visit: Payer: Self-pay

## 2021-06-05 DIAGNOSIS — M2012 Hallux valgus (acquired), left foot: Secondary | ICD-10-CM | POA: Diagnosis not present

## 2021-06-05 DIAGNOSIS — G629 Polyneuropathy, unspecified: Secondary | ICD-10-CM

## 2021-06-05 DIAGNOSIS — M79675 Pain in left toe(s): Secondary | ICD-10-CM | POA: Diagnosis not present

## 2021-06-05 DIAGNOSIS — L84 Corns and callosities: Secondary | ICD-10-CM | POA: Diagnosis not present

## 2021-06-05 DIAGNOSIS — M2011 Hallux valgus (acquired), right foot: Secondary | ICD-10-CM

## 2021-06-05 DIAGNOSIS — B351 Tinea unguium: Secondary | ICD-10-CM

## 2021-06-05 DIAGNOSIS — M79674 Pain in right toe(s): Secondary | ICD-10-CM

## 2021-06-05 NOTE — Progress Notes (Signed)
This patient presents to the office with chief complaint of long thick painful nails.  Patient says the nails are painful walking and wearing shoes.  This patient is unable to self treat.  This patient is unable to trim her nails since she is unable to reach her nails.  She has painful callus on both feet.   She presents to the office with her daughter. She presents to the office for preventative foot care services. ? ?General Appearance  Alert, conversant and in no acute stress. ? ?Vascular  Dorsalis pedis and posterior tibial  pulses are  weakly palpable  bilaterally.  Capillary return is within normal limits  bilaterally. Temperature is within normal limits  bilaterally. ? ?Neurologic  Senn-Weinstein monofilament wire test within normal limits  bilaterally. Muscle power within normal limits bilaterally. ? ?Nails Thick disfigured discolored nails with subungual debris  from hallux to fifth toes bilaterally. No evidence of bacterial infection or drainage bilaterally. ? ?Orthopedic  No limitations of motion  feet .  No crepitus or effusions noted.  No bony pathology or digital deformities noted. ? ?Skin  normotropic skin with no porokeratosis noted bilaterally.  No signs of infections or ulcers noted.   Corn second toe left foot, corn third toe right foot and callus sub th met right foot. ? ?Onychomycosis  Nails  B/L.  Pain in right toes  Pain in left toes  Corn/Callus  B/L ? ?Debridement of nails both feet followed trimming the nails with dremel tool.  Debride callus/corn   B/L with # 15 blade.    RTC 3 months. ? ? ?Gardiner Barefoot DPM   ?

## 2021-07-05 ENCOUNTER — Other Ambulatory Visit (HOSPITAL_BASED_OUTPATIENT_CLINIC_OR_DEPARTMENT_OTHER): Payer: Self-pay | Admitting: Cardiovascular Disease

## 2021-07-05 NOTE — Telephone Encounter (Signed)
Rx(s) sent to pharmacy electronically.  

## 2021-08-31 ENCOUNTER — Encounter: Payer: Self-pay | Admitting: Family Medicine

## 2021-09-11 ENCOUNTER — Ambulatory Visit (INDEPENDENT_AMBULATORY_CARE_PROVIDER_SITE_OTHER): Payer: Medicare Other | Admitting: Podiatry

## 2021-09-11 DIAGNOSIS — M79674 Pain in right toe(s): Secondary | ICD-10-CM | POA: Diagnosis not present

## 2021-09-11 DIAGNOSIS — B351 Tinea unguium: Secondary | ICD-10-CM

## 2021-09-11 DIAGNOSIS — M79675 Pain in left toe(s): Secondary | ICD-10-CM | POA: Diagnosis not present

## 2021-09-11 DIAGNOSIS — G629 Polyneuropathy, unspecified: Secondary | ICD-10-CM

## 2021-09-11 DIAGNOSIS — L84 Corns and callosities: Secondary | ICD-10-CM | POA: Diagnosis not present

## 2021-09-19 ENCOUNTER — Encounter: Payer: Self-pay | Admitting: Podiatry

## 2021-09-19 NOTE — Progress Notes (Signed)
  Subjective:  Patient ID: Jennifer Peck, female    DOB: 1923/03/15,  MRN: [id number]  Jennifer Peck presents to clinic today for at risk foot care with history of peripheral neuropathy and corn(s) b/l feet, callus(es) right lower extremity and painful mycotic nails.  Pain interferes with ambulation. Aggravating factors include wearing enclosed shoe gear. Painful toenails interfere with ambulation. Aggravating factors include wearing enclosed shoe gear. Pain is relieved with periodic professional debridement. Painful corns and calluses are aggravated when weightbearing with and without shoegear. Pain is relieved with periodic professional debridement.  New problem(s): None.   Patient recently had skin cancer removed from her left leg. Her daughter is present during today's visit.  PCP is Peck, Jennifer Blackwater, MD , and last visit was May 04, 2021.  Allergies  Allergen Reactions   Chlorthalidone     hyponatremia   Atenolol     Caused fatigue    Besivance [Besifloxacin Hcl]     rash   Codeine Nausea And Vomiting   Crestor [Rosuvastatin]     "leg gave away"   Gabapentin     Memory loss and confusion   Lipitor [Atorvastatin] Other (See Comments)    Severe leg pain   Lovastatin     myalgia   Simvastatin     Her leg gave away on her and states thinks secondary to Simvastatin   Statins Support [Acid Blockers Support]     Other reaction(s): Unknown   Sulfa Antibiotics Nausea Only   Vigamox [Moxifloxacin]     rash   Welchol [Colesevelam Hcl]     Review of Systems: Negative except as noted in the HPI.  Objective: No changes noted in today's physical examination. Constitutional Jennifer Peck is a pleasant 88 y.o. Caucasian female, frail, in NAD. AAO x 3.   Vascular CFT <3 seconds b/l LE. Faintly palpable DP pulses b/l LE. Faintly palpable PT pulse(s) b/l LE. Pedal hair absent. No pain with calf compression b/l. Lower extremity skin temperature gradient warm to cool. No edema noted  b/l LE. Evidence of chronic venous insufficiency b/l LE. No cyanosis or clubbing noted b/l LE.  Neurologic Normal speech. Oriented to person, place, and time. Protective sensation diminished with 10g monofilament b/l.  Dermatologic Pedal integument with normal turgor, texture and tone BLE. Toenails 1-5 b/l elongated, discolored, dystrophic, thickened, crumbly with subungual debris and tenderness to dorsal palpation. Hyperkeratotic lesion(s) submet head 5 right foot.  No erythema, no edema, no drainage, no fluctuance.  Orthopedic: Noted disuse atrophy bilaterally. No pain, crepitus or joint limitation noted with ROM bilateral LE. Hammertoe(s) noted to the L 2nd toe, L 3rd toe, L 4th toe, R 2nd toe, R 3rd toe, and R 4th toe.   Radiographs: None  Assessment/Plan: 1. Pain due to onychomycosis of toenails of both feet   2. Callus   3. Neuropathy     -Examined patient. -Toenails 1-5 b/l were debrided in length and girth with sterile nail nippers and dremel without iatrogenic bleeding.  -Callus(es) submet head 5 right foot pared utilizing sterile scalpel blade without complication or incident. Total number debrided =1. -Patient/POA to call should there be question/concern in the interim.   Return in about 3 months (around 12/12/2021).  Marzetta Board, DPM

## 2021-09-28 ENCOUNTER — Encounter: Payer: Self-pay | Admitting: Family Medicine

## 2021-09-28 ENCOUNTER — Ambulatory Visit (INDEPENDENT_AMBULATORY_CARE_PROVIDER_SITE_OTHER): Payer: Medicare Other | Admitting: Family Medicine

## 2021-09-28 VITALS — BP 138/70 | HR 79 | Temp 97.4°F | Ht 60.0 in | Wt 130.6 lb

## 2021-09-28 DIAGNOSIS — L570 Actinic keratosis: Secondary | ICD-10-CM

## 2021-09-28 DIAGNOSIS — L814 Other melanin hyperpigmentation: Secondary | ICD-10-CM

## 2021-09-28 DIAGNOSIS — R21 Rash and other nonspecific skin eruption: Secondary | ICD-10-CM | POA: Diagnosis not present

## 2021-09-28 DIAGNOSIS — R233 Spontaneous ecchymoses: Secondary | ICD-10-CM

## 2021-09-28 DIAGNOSIS — L299 Pruritus, unspecified: Secondary | ICD-10-CM

## 2021-09-28 MED ORDER — FLUOROURACIL 4 % EX CREA: TOPICAL_CREAM | CUTANEOUS | 1 refills | Status: AC

## 2021-09-28 NOTE — Progress Notes (Signed)
OFFICE VISIT  09/28/2021  CC:  Chief Complaint  Patient presents with   Rash    Pt mentions rash has been going on for 1 year, she believes it is due to her blood pressure medication. Pt has d/c taking 1/2 tab qhs    Patient is a 88 y.o. female who presents for rash.  HPI: Recurrent spots on mostly arms and legs that are of various colors and usually not raised but sometimes crusted bumps are present.  Her skin does itch a lot.  She is pretty adamant that this did not start until about a year ago.  Says her son has the same thing and has been using fluorouracil cream.   Past Medical History:  Diagnosis Date   Allergic state 04/30/2011   Anxiety    BCC (basal cell carcinoma), face 11/06/2010   Bilateral bunions 11/06/2010   Chronic venous insufficiency    lymphedema + venous stasis pigmentation changes   Constipation 11/06/2010   Coronary artery disease 2011   BMS x 3   Diastolic heart failure (HCC)    GERD (gastroesophageal reflux disease) 05/11/2012   History of hyperkalemia 01/23/2011   Hyperlipidemia    Intolerant of statins and zetia.  Refused to consider PCSK9 inhibitor initially but as of 02/2017 cardiol f/u she would consider repatha if approved.   Hypertension    +white coat component   Lesion of labia 02/04/2012   Melanoma in situ [facility]) 08/2016   Derm= Dr. Renda Rolls @ [facility] in [place].   Mitral regurgitation 12/2020   moderate by echo 12/2020   Osteoarthritis of both knees    R>L   Osteopenia 11/23/2010; 02/2016   2017 T-score -1.9.  Repeat DEXA 2 yrs.   Peripheral neuropathy 11/06/2010   Small fiber, non-diabetic per neurologist   Right lumbar radiculopathy 01/2015   Improved with PT   SCC (squamous cell carcinoma), arm 11/06/2010   arm 2012.  R lower leg 10/2020   Seborrheic keratosis 04/30/2011   Vertigo     Past Surgical History:  Procedure Laterality Date   CARDIAC CATHETERIZATION  12/20/2009   3 bare metal stents   DEXA  03/05/2016    T-score -1.9   EYE SURGERY     cataract- left eye 09-24-11   SKIN BIOPSY  08/2012   Shave excision of lesion on pt's back: irritated seb keratosis.   TRANSTHORACIC ECHOCARDIOGRAM  12/05/2009   Mild LVH, normal systolic fxn, mild impaired relaxation.  No signif valvular dz. 12/01/20->EF 60-65%, grd II DD, mod MR, +mild pulm art htn/   TUBAL LIGATION      Outpatient Medications Prior to Visit  Medication Sig Dispense Refill   amLODipine (NORVASC) 5 MG tablet TAKE 1 TABLET BY MOUTH EVERY DAY 90 tablet 1   aspirin 81 MG tablet Take 81 mg by mouth daily. 3 days a week     azelastine (OPTIVAR) 0.05 % ophthalmic solution INSTILL TWO DROPS INTO EACH EYE TWICE DAILY 6 mL 12   B Complex Vitamins (B COMPLEX-B12 PO) Take by mouth daily.     Coenzyme Q10 (COQ10 PO) Take by mouth once a week.     enalapril (VASOTEC) 20 MG tablet TAKE ONE TAB BY MOUTH IN MORNING AND ONE HALF TAB BY MOUTH AT BEDTIME 135 tablet 0   fexofenadine (ALLEGRA) 60 MG tablet 1 tab po bid for itching 60 tablet 3   fluocinonide (LIDEX) 0.05 % external solution Apply 1 application topically daily as needed.  fluticasone (CUTIVATE) 0.05 % cream Apply to affected area of itchy skin bid as needed 60 g 2   magnesium oxide (MAG-OX) 400 MG tablet Take 400 mg by mouth once a week.     metoprolol succinate (TOPROL-XL) 25 MG 24 hr tablet TAKE 1 TABLET BY MOUTH EVERY DAY 90 tablet 3   Multiple Vitamin (MULTIVITAMIN) tablet Take 1 tablet by mouth once a week. Takes occ     Naftifine HCl (NAFTIN) 2 % GEL Apply to affected area qd-bid 60 g 3   Omega-3 Fatty Acids (FISH OIL PO) Take 1 capsule by mouth once a week.     No facility-administered medications prior to visit.    Allergies  Allergen Reactions   Chlorthalidone     hyponatremia   Atenolol     Caused fatigue    Besivance [Besifloxacin Hcl]     rash   Codeine Nausea And Vomiting   Crestor [Rosuvastatin]     "leg gave away"   Gabapentin     Memory loss and confusion    Lipitor [Atorvastatin] Other (See Comments)    Severe leg pain   Lovastatin     myalgia   Simvastatin     Her leg gave away on her and states thinks secondary to Simvastatin   Statins Support [Acid Blockers Support]     Other reaction(s): Unknown   Sulfa Antibiotics Nausea Only   Vigamox [Moxifloxacin]     rash   Welchol [Colesevelam Hcl]     ROS As per HPI  PE:    09/28/2021    9:59 AM 09/28/2021    9:47 AM 05/17/2021    9:41 AM  Vitals with BMI  Height  '5\' 0"'$    Weight  130 lbs 10 oz 134 lbs 6 oz  BMI  50.27 74.12  Systolic 878 676 720  Diastolic 70 69 70  Pulse  79 73     Physical Exam  Gen: Alert, well appearing.  Patient is oriented to person, place, time, and situation. AFFECT: pleasant, lucid thought and speech. Arms and legs have scattered macular lesions of various sizes and colors.  She has a few crusted plaques scattered over these same areas.  These are typically a few millimeters in size.  No excoriations.  LABS:  Last CBC Lab Results  Component Value Date   WBC 5.7 05/04/2021   HGB 14.2 05/04/2021   HCT 43.3 05/04/2021   MCV 94.3 05/04/2021   MCH 31.8 08/17/2020   RDW 13.5 05/04/2021   PLT 177.0 94/70/9628   Last metabolic panel Lab Results  Component Value Date   GLUCOSE 110 (H) 05/04/2021   NA 136 05/04/2021   K 4.4 05/04/2021   CL 99 05/04/2021   CO2 32 05/04/2021   BUN 25 (H) 05/04/2021   CREATININE 0.81 05/04/2021   GFRNONAA 73 09/29/2019   CALCIUM 9.9 05/04/2021   PHOS 3.5 05/07/2012   PROT 7.3 05/04/2021   ALBUMIN 4.7 05/04/2021   LABGLOB 2.8 09/29/2019   AGRATIO 1.6 09/29/2019   BILITOT 0.6 05/04/2021   ALKPHOS 62 05/04/2021   AST 11 05/04/2021   ALT 13 05/04/2021   ANIONGAP 11 05/02/2015   IMPRESSION AND PLAN:  #1 itchy skin lesions. For the most part it looks like she has solar keratosis in a background of dermatoheliosis-->   I reassured patient that I do not think this is due to any of her medications. We will do trial  of fluorouracil cream 4%, apply once daily--I told  her to just do the right lower leg for now to see if it has any effect.  Tretinoin is a future possibility. Of note, she said that her dermatologist Dr. Renda Rolls is "spraying" some sort of topical medication on her skin, possibly fluorouracil.  We will try to get records.  An After Visit Summary was printed and given to the patient.  FOLLOW UP: Return for 3-4 wk f/u skin.  Signed:  Crissie Sickles, MD           09/28/2021

## 2021-10-05 ENCOUNTER — Telehealth: Payer: Self-pay

## 2021-10-05 NOTE — Telephone Encounter (Signed)
Patient daughter, Helene Kelp [facility]) calling regarding medication prescribed by Dr. Anitra Lauth last week.  [facility] do not have 4% cream but they do have 5% cream. If this is okay, please send in new prescription to [place], [facility] [employer], [address].  Daughter will be picking up.  Please follow up with Helene Kelp at [phone]

## 2021-10-05 NOTE — Telephone Encounter (Signed)
Yes, okay to change. Can you do the prescription?

## 2021-10-05 NOTE — Telephone Encounter (Signed)
Please review and advise if ok for change

## 2021-10-06 MED ORDER — FLUOROURACIL 5 % EX CREA: TOPICAL_CREAM | CUTANEOUS | 0 refills | Status: DC

## 2021-10-06 NOTE — Telephone Encounter (Signed)
Rx sent to pharmacy   

## 2021-10-09 ENCOUNTER — Other Ambulatory Visit (HOSPITAL_BASED_OUTPATIENT_CLINIC_OR_DEPARTMENT_OTHER): Payer: Self-pay | Admitting: Cardiovascular Disease

## 2021-10-09 NOTE — Telephone Encounter (Signed)
Rx request sent to pharmacy.  

## 2021-10-30 ENCOUNTER — Telehealth: Payer: Self-pay | Admitting: Cardiovascular Disease

## 2021-10-30 NOTE — Telephone Encounter (Signed)
Pt c/o medication issue:  1. Name of Medication:  enalapril (VASOTEC) 20 MG tablet amLODipine (NORVASC) 5 MG tablet metoprolol succinate (TOPROL-XL) 25 MG 24 hr tablet  2. How are you currently taking this medication (dosage and times per day)? 1 tablet daily of each  3. Are you having a reaction (difficulty breathing--STAT)? no  4. What is your medication issue? Patient states she is breaking out all over from the medication.

## 2021-10-30 NOTE — Telephone Encounter (Signed)
Call to patient who states she's had issues for over a year with itching and small red dots.  Pt saw PCP in July and has seen dermatologist in the past for the same issue.  Pt recently read an article about the medications she is taking causing allergic reactions and she thinks this might be what is happening with her.  Explained that Probation officer reviewed PCP notes and it didn't look like he thought it was an allergic reaction.  Pt has remote history of "frequent sun exposure."  Discussed obtaining a moisturizer with anti itch components for a trial.  Explained she should drink plenty of fluids since she is taking some medications that can contribute to dry skin.  Pt verbalized understanding.  Georgana Curio MHA RN CCM

## 2021-11-13 NOTE — Telephone Encounter (Signed)
Agree with plan as provided.  She has been on medications for some time - low suspicion they are causing any skin irritation.   Loel Dubonnet, NP

## 2021-11-25 ENCOUNTER — Other Ambulatory Visit: Payer: Self-pay | Admitting: Family Medicine

## 2021-12-01 ENCOUNTER — Other Ambulatory Visit: Payer: Self-pay | Admitting: Family Medicine

## 2021-12-13 ENCOUNTER — Ambulatory Visit: Payer: Medicare Other | Admitting: Podiatry

## 2021-12-13 ENCOUNTER — Encounter: Payer: Self-pay | Admitting: Family Medicine

## 2021-12-13 ENCOUNTER — Ambulatory Visit (INDEPENDENT_AMBULATORY_CARE_PROVIDER_SITE_OTHER): Payer: Medicare Other | Admitting: Family Medicine

## 2021-12-13 VITALS — BP 152/71 | HR 68 | Temp 97.8°F | Ht 60.0 in | Wt 132.0 lb

## 2021-12-13 DIAGNOSIS — R600 Localized edema: Secondary | ICD-10-CM

## 2021-12-13 DIAGNOSIS — I10 Essential (primary) hypertension: Secondary | ICD-10-CM | POA: Diagnosis not present

## 2021-12-13 DIAGNOSIS — Z23 Encounter for immunization: Secondary | ICD-10-CM | POA: Diagnosis not present

## 2021-12-13 DIAGNOSIS — L282 Other prurigo: Secondary | ICD-10-CM

## 2021-12-13 DIAGNOSIS — I2510 Atherosclerotic heart disease of native coronary artery without angina pectoris: Secondary | ICD-10-CM | POA: Diagnosis not present

## 2021-12-13 LAB — BASIC METABOLIC PANEL
BUN: 14 mg/dL (ref 6–23)
CO2: 27 mEq/L (ref 19–32)
Calcium: 9.6 mg/dL (ref 8.4–10.5)
Chloride: 101 mEq/L (ref 96–112)
Creatinine, Ser: 0.73 mg/dL (ref 0.40–1.20)
GFR: 69.09 mL/min (ref >60.00)
Glucose, Bld: 97 mg/dL (ref 70–99)
Potassium: 3.9 mEq/L (ref 3.5–5.1)
Sodium: 137 mEq/L (ref 135–145)

## 2021-12-13 MED ORDER — AMLODIPINE BESYLATE 5 MG PO TABS: 5.0000 mg | ORAL_TABLET | Freq: Every day | ORAL | 1 refills | Status: DC

## 2021-12-13 MED ORDER — METOPROLOL SUCCINATE ER 25 MG PO TB24: 25.0000 mg | ORAL_TABLET | Freq: Every day | ORAL | 1 refills | Status: DC

## 2021-12-13 MED ORDER — ENALAPRIL MALEATE 20 MG PO TABS
ORAL_TABLET | ORAL | 1 refills | Status: DC
Start: 2021-12-13 — End: 2022-06-08

## 2021-12-13 NOTE — Progress Notes (Signed)
OFFICE VISIT  12/13/2021  CC:  Chief Complaint  Patient presents with   Hypertension    Pt is fasting   Patient is a 88 y.o. female who presents for 7 mo f/u HTN, lower extremity edema d/t venous insufficiency, and CAD. A/P as of last visit: "#1 hypertension, well controlled on amlodipine 5 mg a day and enalapril 20 mg every morning and 10 mg every afternoon as well as Toprol-XL 25/day. Electrolytes and creatinine today.  2.  Pruritic rash. Admittedly, many of the spots that she sees on her skin I cannot see anything abnormal. She does have several lesions on extremities that I do note as described in my exam. I am hesitant to do a biopsy due to her past history of some bleeding that scared her when I removed a skin lesion. Question malar rash noted today but pt states this has not been present until today b/c she scrubbed her face firmly this morning. Trial of allegra 60 bid. She will make appointment with her dermatologist, Dr. Renda Rolls. We will check cbc, sed rate and CRP today.   #3 chronic lower extremity edema: No pitting edema. This is stable.  No meds   #4 coronary artery disease. She has restarted her aspirin a couple days a week, fearful that this will cause too much bleeding. Continue beta-blocker.  History of statin intolerance and does not want trial of any further cholesterol-lowering medication."  INTERIM HX: He is feeling well. Home blood pressures in the 120s over 70s. No palpitations, dizziness, chest pain, or shortness of breath.  No recent change in her lower extremity edema.  Still has many violaceous palpable macules covering extremities for the most part, quite itchy.  This has been a long-term rash.  Has not really responded much to fluorouracil cream.  Past Medical History:  Diagnosis Date   Allergic state 04/30/2011   Anxiety    BCC (basal cell carcinoma), face 11/06/2010   Bilateral bunions 11/06/2010   Chronic venous insufficiency     lymphedema + venous stasis pigmentation changes   Constipation 11/06/2010   Coronary artery disease 2011   BMS x 3   Diastolic heart failure (HCC)    GERD (gastroesophageal reflux disease) 05/11/2012   History of hyperkalemia 01/23/2011   Hyperlipidemia    Intolerant of statins and zetia.  Refused to consider PCSK9 inhibitor initially but as of 02/2017 cardiol f/u she would consider repatha if approved.   Hypertension    +white coat component   Lesion of labia 02/04/2012   Melanoma in situ [facility]) 08/2016   Derm= Dr. Renda Rolls @ [facility]ology specialists in [place].   Mitral regurgitation 12/2020   moderate by echo 12/2020   Osteoarthritis of both knees    R>L   Osteopenia 11/23/2010; 02/2016   2017 T-score -1.9.  Repeat DEXA 2 yrs.   Peripheral neuropathy 11/06/2010   Small fiber, non-diabetic per neurologist   Right lumbar radiculopathy 01/2015   Improved with PT   SCC (squamous cell carcinoma), arm 11/06/2010   arm 2012.  R lower leg 10/2020   Seborrheic keratosis 04/30/2011   Vertigo     Past Surgical History:  Procedure Laterality Date   CARDIAC CATHETERIZATION  12/20/2009   3 bare metal stents   DEXA  03/05/2016   T-score -1.9   EYE SURGERY     cataract- left eye 09-24-11   SKIN BIOPSY  08/2012   Shave excision of lesion on pt's back: irritated seb keratosis.   TRANSTHORACIC ECHOCARDIOGRAM  12/05/2009   Mild LVH, normal systolic fxn, mild impaired relaxation.  No signif valvular dz. 12/01/20->EF 60-65%, grd II DD, mod MR, +mild pulm art htn/   TUBAL LIGATION      Outpatient Medications Prior to Visit  Medication Sig Dispense Refill   aspirin 81 MG tablet Take 81 mg by mouth daily. 3 days a week     azelastine (OPTIVAR) 0.05 % ophthalmic solution INSTILL TWO DROPS INTO EACH EYE TWICE DAILY 6 mL 12   B Complex Vitamins (B COMPLEX-B12 PO) Take by mouth daily.     Coenzyme Q10 (COQ10 PO) Take by mouth once a week.     fexofenadine (ALLEGRA) 60 MG tablet TAKE 1 TABLET BY MOUTH  TWICE A DAY FOR ITCHING 180 tablet 1   fluocinonide (LIDEX) 0.05 % external solution Apply 1 application topically daily as needed.     fluorouracil (EFUDEX) 5 % cream Apply to affected area once daily 40 g 0   Fluorouracil 4 % CREA Apply to affected areas once daily 40 g 1   fluticasone (CUTIVATE) 0.05 % cream Apply to affected area of itchy skin bid as needed 60 g 2   magnesium oxide (MAG-OX) 400 MG tablet Take 400 mg by mouth once a week.     Multiple Vitamin (MULTIVITAMIN) tablet Take 1 tablet by mouth once a week. Takes occ     Naftifine HCl (NAFTIN) 2 % GEL Apply to affected area qd-bid 60 g 3   Omega-3 Fatty Acids (FISH OIL PO) Take 1 capsule by mouth once a week.     amLODipine (NORVASC) 5 MG tablet TAKE 1 TABLET BY MOUTH EVERY DAY 30 tablet 0   enalapril (VASOTEC) 20 MG tablet TAKE ONE TAB BY MOUTH IN MORNING AND ONE HALF TAB BY MOUTH AT BEDTIME 135 tablet 0   metoprolol succinate (TOPROL-XL) 25 MG 24 hr tablet TAKE 1 TABLET BY MOUTH EVERY DAY 90 tablet 3   No facility-administered medications prior to visit.    Allergies  Allergen Reactions   Chlorthalidone     hyponatremia   Atenolol     Caused fatigue    Besivance [Besifloxacin Hcl]     rash   Codeine Nausea And Vomiting   Crestor [Rosuvastatin]     "leg gave away"   Gabapentin     Memory loss and confusion   Lipitor [Atorvastatin] Other (See Comments)    Severe leg pain   Lovastatin     myalgia   Simvastatin     Her leg gave away on her and states thinks secondary to Simvastatin   Statins Support [Acid Blockers Support]     Other reaction(s): Unknown   Sulfa Antibiotics Nausea Only   Vigamox [Moxifloxacin]     rash   Welchol [Colesevelam Hcl]     ROS As per HPI  PE:    12/13/2021    8:28 AM 09/28/2021    9:59 AM 09/28/2021    9:47 AM  Vitals with BMI  Height '5\' 0"'$   '5\' 0"'$   Weight 132 lbs  130 lbs 10 oz  BMI 27.78  24.23  Systolic 536 144 315  Diastolic 71 70 69  Pulse 68  79     Physical  Exam  Gen: Alert, well appearing.  Patient is oriented to person, place, time, and situation. AFFECT: pleasant, lucid thought and speech. CV: RRR, occasional ectopy, no m/r/g.   LUNGS: CTA bilat, nonlabored resps, good aeration in all lung fields. Extremities show 1+ pitting edema  in the ankles, with a mild amount of nonpitting edema.  Generous amount of subcutaneous adipose tissue in the legs. Skin: Arms and legs have scattered, slightly palpable macular lesions of various sizes and colors.  She has a few crusted plaques scattered over these same areas.  These are typically a few millimeters in size.  No excoriations.  LABS:  Last CBC Lab Results  Component Value Date   WBC 5.7 05/04/2021   HGB 14.2 05/04/2021   HCT 43.3 05/04/2021   MCV 94.3 05/04/2021   MCH 31.8 08/17/2020   RDW 13.5 05/04/2021   PLT 177.0 03/27/3233   Last metabolic panel Lab Results  Component Value Date   GLUCOSE 110 (H) 05/04/2021   NA 136 05/04/2021   K 4.4 05/04/2021   CL 99 05/04/2021   CO2 32 05/04/2021   BUN 25 (H) 05/04/2021   CREATININE 0.81 05/04/2021   GFRNONAA 73 09/29/2019   CALCIUM 9.9 05/04/2021   PHOS 3.5 05/07/2012   PROT 7.3 05/04/2021   ALBUMIN 4.7 05/04/2021   LABGLOB 2.8 09/29/2019   AGRATIO 1.6 09/29/2019   BILITOT 0.6 05/04/2021   ALKPHOS 62 05/04/2021   AST 11 05/04/2021   ALT 13 05/04/2021   ANIONGAP 11 05/02/2015   IMPRESSION AND PLAN:  #1 hypertension, well controlled on amlodipine 5 mg a day, enalapril 20 mg every morning and 10 mg every afternoon, and Toprol-XL 25 mg a day. Electrolytes and creatinine today.  #2 lower extremity edema.  Minimal pitting. Minimize sodium, elevate as needed.  #3 pruritic rash, chronic for the most part it looks like she has solar keratosis in a background of dermatoheliosis.  Okay to continue fluorouracil for 1 to 2 weeks, then take 2-week break, resume, etc.  #4 CAD, asymptomatic. Continue aspirin and beta-blocker. History of  statin intolerance and does not want trial of any further cholesterol-lowering medication.  An After Visit Summary was printed and given to the patient.  FOLLOW UP: Return in about 6 months (around 06/13/2022) for routine chronic illness f/u.  Signed:  Crissie Sickles, MD           12/13/2021

## 2021-12-14 ENCOUNTER — Telehealth: Payer: Self-pay | Admitting: Family Medicine

## 2021-12-14 NOTE — Telephone Encounter (Signed)
Informed patient that all her labs were normal based on Dr. Anitra Lauth note.

## 2022-02-26 ENCOUNTER — Encounter (HOSPITAL_BASED_OUTPATIENT_CLINIC_OR_DEPARTMENT_OTHER): Payer: Self-pay | Admitting: Cardiovascular Disease

## 2022-02-26 ENCOUNTER — Ambulatory Visit (INDEPENDENT_AMBULATORY_CARE_PROVIDER_SITE_OTHER): Payer: Medicare Other | Admitting: Cardiovascular Disease

## 2022-02-26 VITALS — BP 128/72 | HR 72 | Ht 60.0 in | Wt 131.5 lb

## 2022-02-26 DIAGNOSIS — I259 Chronic ischemic heart disease, unspecified: Secondary | ICD-10-CM

## 2022-02-26 DIAGNOSIS — I10 Essential (primary) hypertension: Secondary | ICD-10-CM

## 2022-02-26 DIAGNOSIS — M79605 Pain in left leg: Secondary | ICD-10-CM

## 2022-02-26 DIAGNOSIS — M79604 Pain in right leg: Secondary | ICD-10-CM

## 2022-02-26 NOTE — Assessment & Plan Note (Addendum)
Blood pressure has been well-controlled.  She has a rash and thinks that it is related to her mediation.  We will try holding metoprolol to see if it helps.  She was encouraged to get back on her exercise program.  Continue enalapril and amlodipine.  She will keep checking her blood pressure at home and bring to follow-up.

## 2022-02-26 NOTE — Assessment & Plan Note (Signed)
Prior PCI with bare-metal stents.  She had to stop her aspirin due to bleeding.  She is not having any angina.  She is going to work on getting back into her exercise routine.  Holding metoprolol as above.  She has not been interested in statins.

## 2022-02-26 NOTE — Patient Instructions (Signed)
Medication Instructions:  HOLD YOUR METOPROLOL FOR 2 WEEKS  CALL OR SEND MYCHART MESSAGE AND UPDATE ON RASH  *If you need a refill on your cardiac medications before your next appointment, please call your pharmacy*  Lab Work: NONE  Testing/Procedures: Your physician has requested that you have an ankle brachial index (ABI). During this test an ultrasound and blood pressure cuff are used to evaluate the arteries that supply the arms and legs with blood. Allow thirty minutes for this exam. There are no restrictions or special instructions.  Your physician has requested that you have a lower or upper extremity arterial duplex. This test is an ultrasound of the arteries in the legs or arms. It looks at arterial blood flow in the legs and arms. Allow one hour for Lower and Upper Arterial scans. There are no restrictions or special instructions  Follow-Up: At [facility], you and your health needs are our priority.  As part of our continuing mission to provide you with exceptional heart care, we have created designated Provider Care Teams.  These Care Teams include your primary Cardiologist (physician) and Advanced Practice Providers (APPs -  Physician Assistants and Nurse Practitioners) who all work together to provide you with the care you need, when you need it.  We recommend signing up for the patient portal called "MyChart".  Sign up information is provided on this After Visit Summary.  MyChart is used to connect with patients for Virtual Visits (Telemedicine).  Patients are able to view lab/test results, encounter notes, upcoming appointments, etc.  Non-urgent messages can be sent to your provider as well.   To learn more about what you can do with MyChart, go to [email or web address].    Your next appointment:   3 month(s)  The format for your next appointment:   In Person  Provider:   Laurann Montana, NP

## 2022-02-26 NOTE — Assessment & Plan Note (Signed)
She struggles with pain and tenderness in both lower legs.  She also has chronic skin changes which are possibly related to venous stasis dermatitis versus rash related.  Peripheral pulses are diminished.  Will check arterial Dopplers and ABIs.  She does not currently have any edema so we will not get venous Dopplers.

## 2022-02-26 NOTE — Progress Notes (Signed)
Cardiology Office Note   Date:  02/26/2022   ID:  Jennifer Peck, DOB 1922/05/13, MRN [id number]  PCP:  Jennifer Sou, MD  Cardiologist:   Jennifer Latch, MD   No chief complaint on file.    History of Present Illness: Jennifer Peck is a 88 y.o. female with CAD s/p PCI, hypertension and hyperlipidemia who presents for follow-up.  Jennifer Peck was previously patient of Dr. Mare Peck. She last saw him 02/2015.  She had a bare metal stents placed in her left circumflex, RCA, OM 1 and OM 2 vessels in 2011.  She has not tolerated statin medications or Zetia in the past due to myalgias.  She has tried to manage her lipids with diet and exercise.  She was not interested in trying a PCSK9 inhibitor.  She called the office 2/22, with blood pressures in the 200s.She had to reduced her metoprolol with concerns to causing her hair to thin.she followed up with Jennifer Rives, PA-C and had stabilized after taking her medications.   She had a echo 11/2020 that revealed LVEF of 60-65% with grade 2 diastolic dysfunction and mild mitral regurgitation.  Today, she states that she has been experiencing generalized itching which is most severe on her legs. The itching began in August 2021. The itching is precipitated by patches of red, dry skin. She has been using a topical Triamcinolone without relief. She has concerns that this may be due to her medication regimen. However, she has not seen improvement of the itching when she stopped taking the Norvasc at times.  She has been taking the Enalapril for quite some time before the itching began.  Her blood pressures have been well controlled at home.  She stopped taking Aspirin as she had a few episodes of bleeding from abrasions/lacerations which was difficult to control. She also has concerns regarding easy bruisability of her legs despite discontinuing the Aspirin. She notes accompanying mild tenderness to palpation of her ankles. There is no associated  leg swelling.  She states that she has not been exercising as much over the last month due to staying busy hosting family at her home. She stays active around her house and performs her ADLs without difficulty. Typically she exercises for 1 hour 3 days a week following an instructor on a digital exercise program.   The patient denies chest pain, chest pressure, dyspnea at rest or with exertion, PND, orthopnea, or leg swelling. Denies cough, fever, chills, nausea, or vomiting. Denies syncope, presyncope, or snoring. Denies dizziness or lightheadedness.     Past Medical History:  Diagnosis Date   Allergic state 04/30/2011   Anxiety    BCC (basal cell carcinoma), face 11/06/2010   Bilateral bunions 11/06/2010   Chronic venous insufficiency    lymphedema + venous stasis pigmentation changes   Constipation 11/06/2010   Coronary artery disease 2011   BMS x 3   Diastolic heart failure (HCC)    GERD (gastroesophageal reflux disease) 05/11/2012   History of hyperkalemia 01/23/2011   Hyperlipidemia    Intolerant of statins and zetia.  Refused to consider PCSK9 inhibitor initially but as of 02/2017 cardiol f/u she would consider repatha if approved.   Hypertension    +white coat component   Lesion of labia 02/04/2012   Melanoma in situ [facility]) 08/2016   Derm= Jennifer Peck @ [facility] in [place].   Mitral regurgitation 12/2020   moderate by echo 12/2020   Osteoarthritis of both knees  R>L   Osteopenia 11/23/2010; 02/2016   2017 T-score -1.9.  Repeat DEXA 2 yrs.   Peripheral neuropathy 11/06/2010   Small fiber, non-diabetic per neurologist   Right lumbar radiculopathy 01/2015   Improved with PT   SCC (squamous cell carcinoma), arm 11/06/2010   arm 2012.  R lower leg 10/2020   Seborrheic keratosis 04/30/2011   Vertigo     Past Surgical History:  Procedure Laterality Date   CARDIAC CATHETERIZATION  12/20/2009   3 bare metal stents   DEXA  03/05/2016   T-score -1.9   EYE  SURGERY     cataract- left eye 09-24-11   SKIN BIOPSY  08/2012   Shave excision of lesion on pt's back: irritated seb keratosis.   TRANSTHORACIC ECHOCARDIOGRAM  12/05/2009   Mild LVH, normal systolic fxn, mild impaired relaxation.  No signif valvular dz. 12/01/20->EF 60-65%, grd II DD, mod MR, +mild pulm art htn/   TUBAL LIGATION       Current Outpatient Medications  Medication Sig Dispense Refill   amLODipine (NORVASC) 5 MG tablet Take 1 tablet (5 mg total) by mouth daily. 90 tablet 1   azelastine (OPTIVAR) 0.05 % ophthalmic solution INSTILL TWO DROPS INTO EACH EYE TWICE DAILY 6 mL 12   B Complex Vitamins (B COMPLEX-B12 PO) Take by mouth daily.     enalapril (VASOTEC) 20 MG tablet 1 tab po qAM and 1/2 tab po qhs 135 tablet 1   fexofenadine (ALLEGRA) 60 MG tablet TAKE 1 TABLET BY MOUTH TWICE A DAY FOR ITCHING 180 tablet 1   fluocinonide (LIDEX) 0.05 % external solution Apply 1 application topically daily as needed.     fluorouracil (EFUDEX) 5 % cream Apply to affected area once daily 40 g 0   Fluorouracil 4 % CREA Apply to affected areas once daily 40 g 1   fluticasone (CUTIVATE) 0.05 % cream Apply to affected area of itchy skin bid as needed 60 g 2   magnesium oxide (MAG-OX) 400 MG tablet Take 400 mg by mouth once a week.     Multiple Vitamin (MULTIVITAMIN) tablet Take 1 tablet by mouth once a week. Takes occ     Naftifine HCl (NAFTIN) 2 % GEL Apply to affected area qd-bid 60 g 3   Omega-3 Fatty Acids (FISH OIL PO) Take 1 capsule by mouth once a week.     triamcinolone cream (KENALOG) 0.1 % Apply 1 Application topically 2 (two) times daily.     No current facility-administered medications for this visit.    Allergies:   Chlorthalidone, Atenolol, Besivance [besifloxacin hcl], Codeine, Crestor [rosuvastatin], Gabapentin, Lipitor [atorvastatin], Lovastatin, Simvastatin, Statins support [acid blockers support], Sulfa antibiotics, Vigamox [moxifloxacin], and Welchol [colesevelam hcl]     Social History:  The patient  reports that she quit smoking about 44 years ago. Her smoking use included cigarettes. She has never used smokeless tobacco. She reports current alcohol use. She reports that she does not use drugs.   Family History:  The patient's family history includes Asthma in her paternal grandfather; Cancer in her daughter; Heart disease in her brother and father; Hyperlipidemia in her daughter and son; Hypertension in her daughter, daughter, son, son, and son; Stroke in her mother and paternal grandmother; Vision loss in her maternal grandfather.    ROS:  Please see the history of present illness.    + Easy bruisability + Pruritus  All other systems are reviewed and negative.    PHYSICAL EXAM: VS:  BP 128/72 (BP Location:  Left Arm, Patient Position: Sitting, Cuff Size: Normal)   Pulse 72   Ht 5' (1.524 m)   Wt 131 lb 8 oz (59.6 kg)   BMI 25.68 kg/m  , BMI Body mass index is 25.68 kg/m. GENERAL:  Well appearing HEENT: Pupils equal round and reactive, fundi not visualized, oral mucosa unremarkable NECK:  No jugular venous distention, waveform within normal limits, carotid upstroke brisk and symmetric, no bruits LUNGS:  Clear to auscultation bilaterally HEART:  RRR.  PMI not displaced or sustained,S1 and S2 within normal limits, no S3, no S4, no clicks, no rubs, no murmurs ABD:  Flat, positive bowel sounds normal in frequency in pitch, no bruits, no rebound, no guarding, no midline pulsatile mass, no hepatomegaly, no splenomegaly EXT:  2 plus pulses throughout, no edema, no cyanosis no clubbing  SKIN:  No rashes no nodules.  Multiple ecchymoses NEURO:  Cranial nerves II through XII grossly intact, motor grossly intact throughout PSYCH:  Cognitively intact, oriented to person place and time  EKG:  EKG ordered today, 02/26/22.  The ekg ordered today demonstrates sinus rhythm rate of 72bpm with PACs. 03/14/17: sinus rhythm. Rate 77 bpm.   02/19/17: Sinus rhythm.   Rate 70 bpm.  PVCs.  11/13/17: Sinus rhythm.  Rate 78 bpm.  Non-specific ST changes.   09/29/19: Sinus rhythm.  Rate 77 bpm.  PACs.   9/22: no EKG was ordered today.   Studies Reviewed:  Echo 12/02/2020: 1.Left ventricular ejection fraction, by estimation, is 60 to 65%. The left ventricle has normal function. The left ventricle has no regional wall motion abnormalities. Left ventricular diastolic parameters are consistent with Grade II diastolic dysfunction (pseudonormalization). 2.Right ventricular systolic function is normal. The right ventricular size is normal. There is mildly elevated pulmonary artery systolic pressure. The estimated right ventricular systolic pressure is 27.5 mmHg. 3. Left atrial size was severely dilated. 4. Right atrial size was severely dilated. The mitral valve is normal in structure. Moderate mitral valve regurgitation. No evidence of mitral stenosis. 5. Tricuspid valve regurgitation is severe. The aortic valve is normal in structure. Aortic valve regurgitation is trivial. Mild aortic valve sclerosis is present, with no evidence of aortic valve stenosis. 6.The inferior vena cava is normal in size with greater than 50% respiratory variability, suggesting right atrial pressure of 3 mmHg.   Recent Labs: 05/04/2021: ALT 13; Hemoglobin 14.2; Platelets 177.0 12/13/2021: BUN 14; Creatinine, Ser 0.73; Potassium 3.9; Sodium 137    Lipid Panel    Component Value Date/Time   CHOL 234 (H) 09/29/2019 1214   TRIG 95 09/29/2019 1214   HDL 72 09/29/2019 1214   CHOLHDL 3.3 09/29/2019 1214   CHOLHDL 3 01/30/2017 0904   VLDL 19.6 01/30/2017 0904   LDLCALC 146 (H) 09/29/2019 1214   LDLDIRECT 128.0 12/02/2012 0837      Wt Readings from Last 3 Encounters:  02/26/22 131 lb 8 oz (59.6 kg)  12/13/21 132 lb (59.9 kg)  09/28/21 130 lb 9.6 oz (59.2 kg)     ASSESSMENT AND PLAN:  Essential hypertension Blood pressure has been well-controlled.  She has a rash and thinks  that it is related to her mediation.  We will try holding metoprolol to see if it helps.  She was encouraged to get back on her exercise program.  Continue enalapril and amlodipine.  She will keep checking her blood pressure at home and bring to follow-up.  Ischemic heart disease Prior PCI with bare-metal stents.  She had to stop her aspirin  due to bleeding.  She is not having any angina.  She is going to work on getting back into her exercise routine.  Holding metoprolol as above.  She has not been interested in statins.  Leg pain She struggles with pain and tenderness in both lower legs.  She also has chronic skin changes which are possibly related to venous stasis dermatitis versus rash related.  Peripheral pulses are diminished.  Will check arterial Dopplers and ABIs.  She does not currently have any edema so we will not get venous Dopplers.    Current medicines are reviewed at length with the patient today.  The patient does not have concerns regarding medicines.  The following changes have been made:    Labs/ tests ordered today include:   Orders Placed This Encounter  Procedures   EKG 12-Lead   VAS [place] ABI WITH/WO TBI   VAS [place] LOWER EXTREMITY ARTERIAL DUPLEX     Disposition:   Follow up in 3 months with Laurann Montana, NP   I,Alexis Herring,acting as a scribe for Jennifer Latch, MD.,have documented all relevant documentation on the behalf of Jennifer Latch, MD,as directed by  Jennifer Latch, MD while in the presence of Jennifer Latch, MD.  I, [place] Oval Linsey, MD have reviewed all documentation for this visit.  The documentation of the exam, diagnosis, procedures, and orders on 02/26/2022 are all accurate and complete.    Signed, Velta Rockholt C. Oval Linsey, MD, [facility]  02/26/2022 12:38 PM    [place]

## 2022-02-28 ENCOUNTER — Ambulatory Visit (INDEPENDENT_AMBULATORY_CARE_PROVIDER_SITE_OTHER): Payer: Medicare Other | Admitting: Podiatry

## 2022-02-28 DIAGNOSIS — L84 Corns and callosities: Secondary | ICD-10-CM | POA: Diagnosis not present

## 2022-02-28 DIAGNOSIS — Q828 Other specified congenital malformations of skin: Secondary | ICD-10-CM | POA: Diagnosis not present

## 2022-02-28 DIAGNOSIS — M79674 Pain in right toe(s): Secondary | ICD-10-CM | POA: Diagnosis not present

## 2022-02-28 DIAGNOSIS — B351 Tinea unguium: Secondary | ICD-10-CM

## 2022-02-28 DIAGNOSIS — M79675 Pain in left toe(s): Secondary | ICD-10-CM

## 2022-02-28 DIAGNOSIS — G629 Polyneuropathy, unspecified: Secondary | ICD-10-CM

## 2022-02-28 NOTE — Progress Notes (Signed)
Subjective:  Patient ID: Jennifer Peck, female    DOB: [birth year],  MRN: [id number]  Jennifer Peck presents to clinic today for at risk foot care with history of peripheral neuropathy and corn(s) right lower extremity, callus(es) right lower extremity and painful mycotic nails.  Pain interferes with ambulation. Aggravating factors include wearing enclosed shoe gear. Painful toenails interfere with ambulation. Aggravating factors include wearing enclosed shoe gear. Pain is relieved with periodic professional debridement. Painful corns and calluses are aggravated when weightbearing with and without shoegear. Pain is relieved with periodic professional debridement.  Chief Complaint  Patient presents with   Nail Problem    Routine foot care, PCP was last seen 12/13/2021, nail trim right foot callus    New problem(s): None.   PCP is McGowen, Adrian Blackwater, MD.  Allergies  Allergen Reactions   Chlorthalidone     hyponatremia   Atenolol     Caused fatigue    Besivance [Besifloxacin Hcl]     rash   Codeine Nausea And Vomiting   Crestor [Rosuvastatin]     "leg gave away"   Gabapentin     Memory loss and confusion   Lipitor [Atorvastatin] Other (See Comments)    Severe leg pain   Lovastatin     myalgia   Simvastatin     Her leg gave away on her and states thinks secondary to Simvastatin   Statins Support [Acid Blockers Support]     Other reaction(s): Unknown   Sulfa Antibiotics Nausea Only   Vigamox [Moxifloxacin]     rash   Welchol [Colesevelam Hcl]     Review of Systems: Negative except as noted in the HPI.  Objective:  There were no vitals filed for this visit.  DYASIA FIRESTINE is a pleasant 88 y.o. female WD, WN in NAD. AAO x 3.  Vascular CFT <3 seconds b/l LE. Faintly palpable DP pulses b/l LE. Faintly palpable PT pulse(s) b/l LE. Pedal hair absent. No pain with calf compression b/l. Lower extremity skin temperature gradient warm to cool. No edema noted b/l LE. Evidence of  chronic venous insufficiency b/l LE. No cyanosis or clubbing noted b/l LE.  Neurologic Normal speech. Oriented to person, place, and time. Protective sensation diminished with 10g monofilament b/l.  Dermatologic Pedal integument with normal turgor, texture and tone BLE. Toenails 1-5 b/l elongated, discolored, dystrophic, thickened, crumbly with subungual debris and tenderness to dorsal palpation. Hyperkeratotic lesion(s) submet head 5 right foot.  No erythema, no edema, no drainage, no fluctuance. Porokeratotic lesion(s) distal tip of right 3rd toe. No erythema, no edema, no drainage, no fluctuance.  Orthopedic: Noted disuse atrophy bilaterally. No pain, crepitus or joint limitation noted with ROM bilateral LE. Hammertoe(s) noted to the L 2nd toe, L 3rd toe, L 4th toe, R 2nd toe, R 3rd toe, and R 4th toe.   Radiographs: None Assessment/Plan: 1. Pain due to onychomycosis of toenails of both feet   2. Callus   3. Porokeratosis   4. Neuropathy     No orders of the defined types were placed in this encounter.   -Patient's family member present. All questions/concerns addressed on today's visit. -Patient to continue soft, supportive shoe gear daily. -Mycotic toenails 1-5 bilaterally were debrided in length and girth with sterile nail nippers and dremel without incident. -Callus(es) submet head 5 right foot pared utilizing sharp debridement with sterile blade without complication or incident. Total number debrided =1. -Porokeratotic lesion(s) R 3rd toe pared and enucleated with sterile currette without  incident. Total number of lesions debrided=1. -Patient/POA to call should there be question/concern in the interim.   Return in about 3 months (around 05/30/2022).  Marzetta Board, DPM

## 2022-03-04 ENCOUNTER — Encounter: Payer: Self-pay | Admitting: Podiatry

## 2022-03-27 ENCOUNTER — Ambulatory Visit (INDEPENDENT_AMBULATORY_CARE_PROVIDER_SITE_OTHER): Payer: Medicare Other

## 2022-03-27 DIAGNOSIS — M79605 Pain in left leg: Secondary | ICD-10-CM | POA: Diagnosis not present

## 2022-03-27 DIAGNOSIS — I10 Essential (primary) hypertension: Secondary | ICD-10-CM

## 2022-03-27 DIAGNOSIS — M79604 Pain in right leg: Secondary | ICD-10-CM | POA: Diagnosis not present

## 2022-03-27 LAB — VAS US LOWER EXT ART SEG MULTI (SEGMENTALS & LE RAYNAUDS)
Left ABI: 1.09
Right ABI: 1.19

## 2022-04-04 ENCOUNTER — Encounter: Payer: Self-pay | Admitting: Family Medicine

## 2022-05-28 ENCOUNTER — Ambulatory Visit (HOSPITAL_BASED_OUTPATIENT_CLINIC_OR_DEPARTMENT_OTHER): Payer: Medicare Other | Admitting: Family

## 2022-06-01 ENCOUNTER — Ambulatory Visit (HOSPITAL_BASED_OUTPATIENT_CLINIC_OR_DEPARTMENT_OTHER): Payer: Medicare Other | Admitting: Family

## 2022-06-04 ENCOUNTER — Other Ambulatory Visit: Payer: Self-pay | Admitting: Family Medicine

## 2022-06-13 ENCOUNTER — Ambulatory Visit (INDEPENDENT_AMBULATORY_CARE_PROVIDER_SITE_OTHER): Payer: Medicare Other | Admitting: Family Medicine

## 2022-06-13 ENCOUNTER — Encounter: Payer: Self-pay | Admitting: Family Medicine

## 2022-06-13 VITALS — BP 132/70 | HR 68 | Temp 98.0°F | Ht 60.0 in | Wt 134.0 lb

## 2022-06-13 DIAGNOSIS — L03115 Cellulitis of right lower limb: Secondary | ICD-10-CM

## 2022-06-13 DIAGNOSIS — L03116 Cellulitis of left lower limb: Secondary | ICD-10-CM

## 2022-06-13 DIAGNOSIS — I10 Essential (primary) hypertension: Secondary | ICD-10-CM | POA: Diagnosis not present

## 2022-06-13 DIAGNOSIS — R600 Localized edema: Secondary | ICD-10-CM

## 2022-06-13 DIAGNOSIS — I872 Venous insufficiency (chronic) (peripheral): Secondary | ICD-10-CM

## 2022-06-13 DIAGNOSIS — I2510 Atherosclerotic heart disease of native coronary artery without angina pectoris: Secondary | ICD-10-CM | POA: Diagnosis not present

## 2022-06-13 LAB — BASIC METABOLIC PANEL
BUN: 21 mg/dL (ref 6–23)
CO2: 28 mEq/L (ref 19–32)
Calcium: 9.7 mg/dL (ref 8.4–10.5)
Chloride: 100 mEq/L (ref 96–112)
Creatinine, Ser: 0.76 mg/dL (ref 0.40–1.20)
GFR: 65.6 mL/min (ref >60.00)
Glucose, Bld: 97 mg/dL (ref 70–99)
Potassium: 5.1 mEq/L (ref 3.5–5.1)
Sodium: 136 mEq/L (ref 135–145)

## 2022-06-13 MED ORDER — FLUTICASONE PROPIONATE 0.05 % EX CREA: TOPICAL_CREAM | CUTANEOUS | 1 refills | Status: DC

## 2022-06-13 MED ORDER — AMLODIPINE BESYLATE 5 MG PO TABS: 5.0000 mg | ORAL_TABLET | Freq: Every day | ORAL | 1 refills | Status: DC

## 2022-06-13 MED ORDER — FEXOFENADINE HCL 60 MG PO TABS: ORAL_TABLET | ORAL | 1 refills | Status: DC

## 2022-06-13 MED ORDER — ENALAPRIL MALEATE 20 MG PO TABS: ORAL_TABLET | ORAL | 1 refills | Status: DC

## 2022-06-13 MED ORDER — CEPHALEXIN 500 MG PO CAPS: 500.0000 mg | ORAL_CAPSULE | Freq: Two times a day (BID) | ORAL | 0 refills | Status: DC

## 2022-06-13 NOTE — Progress Notes (Signed)
OFFICE VISIT  06/13/2022  CC:  Chief Complaint  Patient presents with   Medical Management of Chronic Issues    Pt is fastIng    Patient is a 89 y.o. female who presents for 65-month follow-up hypertension and lower extremity edema. A/P as of last visit: " #1 hypertension, well controlled on amlodipine 5 mg a day, enalapril 20 mg every morning and 10 mg every afternoon, and Toprol-XL 25 mg a day. Electrolytes and creatinine today.   #2 lower extremity edema.  Minimal pitting. Minimize sodium, elevate as needed.   #3 pruritic rash, chronic for the most part it looks like she has solar keratosis in a background of dermatoheliosis.  Okay to continue fluorouracil for 1 to 2 weeks, then take 2-week break, resume, etc.   #4 CAD, asymptomatic. Continue aspirin and beta-blocker. History of statin intolerance and does not want trial of any further cholesterol-lowering medication."  INTERIM HX: Has gradually had onset of some deep pink, warm and slightly painful rash to the lower legs anterior aspect.  Mild amount of nonpitting edema unchanged. She is not applying anything to it.  No fever, no malaise.  Allegra 60 mg twice a day is helping for her chronic pruritic skin condition.  Home bp avg 130/70, HR 60s-70s  ROS as above, plus--> no fevers, no CP, no SOB, no wheezing, no cough, no dizziness, no HAs, no melena/hematochezia.  No polyuria or polydipsia.  No myalgias or arthralgias.  No focal weakness, paresthesias, or tremors.  No acute vision or hearing abnormalities.  No dysuria or unusual/new urinary urgency or frequency.  No n/v/d or abd pain.  No palpitations.    Past Medical History:  Diagnosis Date   Allergic state 04/30/2011   Anxiety    BCC (basal cell carcinoma), face 11/06/2010   Bilateral bunions 11/06/2010   Chronic venous insufficiency    lymphedema + venous stasis pigmentation changes   Constipation 11/06/2010   Coronary artery disease 2011   BMS x 3   Diastolic  heart failure (HCC)    GERD (gastroesophageal reflux disease) 05/11/2012   History of hyperkalemia 01/23/2011   Hyperlipidemia    Intolerant of statins and zetia.  Refused to consider PCSK9 inhibitor initially but as of 02/2017 cardiol f/u she would consider repatha if approved.   Hypertension    +white coat component   Lesion of labia 02/04/2012   Melanoma in situ [facility]) 08/2016   Derm= Dr. Renda Rolls @ [facility]ology specialists in [place].   Mitral regurgitation 12/2020   moderate by echo 12/2020   Osteoarthritis of both knees    R>L   Osteopenia 11/23/2010; 02/2016   2017 T-score -1.9.  Repeat DEXA 2 yrs.   Peripheral neuropathy 11/06/2010   Small fiber, non-diabetic per neurologist   Right lumbar radiculopathy 01/2015   Improved with PT   SCC (squamous cell carcinoma), arm 11/06/2010   arm 2012.  R lower leg 10/2020   Seborrheic keratosis 04/30/2011   Vertigo     Past Surgical History:  Procedure Laterality Date   CARDIAC CATHETERIZATION  12/20/2009   3 bare metal stents   DEXA  03/05/2016   T-score -1.9   EYE SURGERY     cataract- left eye 09-24-11   LE arterial dopplers     03/2022 NORMAL   SKIN BIOPSY  08/2012   Shave excision of lesion on pt's back: irritated seb keratosis.   TRANSTHORACIC ECHOCARDIOGRAM  12/05/2009   Mild LVH, normal systolic fxn, mild impaired relaxation.  No  signif valvular dz. 12/01/20->EF 60-65%, grd II DD, mod MR, +mild pulm art htn/   TUBAL LIGATION      Outpatient Medications Prior to Visit  Medication Sig Dispense Refill   amLODipine (NORVASC) 5 MG tablet TAKE 1 TABLET (5 MG TOTAL) BY MOUTH DAILY. 30 tablet 0   azelastine (OPTIVAR) 0.05 % ophthalmic solution INSTILL TWO DROPS INTO EACH EYE TWICE DAILY 6 mL 12   B Complex Vitamins (B COMPLEX-B12 PO) Take by mouth daily.     enalapril (VASOTEC) 20 MG tablet 1 tab po qAM and 1/2 tab po qhs 135 tablet 1   fexofenadine (ALLEGRA) 60 MG tablet TAKE 1 TABLET BY MOUTH TWICE A DAY FOR ITCHING 180 tablet 1    fluocinonide (LIDEX) 0.05 % external solution Apply 1 application topically daily as needed.     fluorouracil (EFUDEX) 5 % cream Apply to affected area once daily 40 g 0   Fluorouracil 4 % CREA Apply to affected areas once daily 40 g 1   fluticasone (CUTIVATE) 0.05 % cream Apply to affected area of itchy skin bid as needed 60 g 2   magnesium oxide (MAG-OX) 400 MG tablet Take 400 mg by mouth once a week.     metoprolol succinate (TOPROL-XL) 25 MG 24 hr tablet Take 25 mg by mouth daily.     Multiple Vitamin (MULTIVITAMIN) tablet Take 1 tablet by mouth once a week. Takes occ     Omega-3 Fatty Acids (FISH OIL PO) Take 1 capsule by mouth once a week.     triamcinolone cream (KENALOG) 0.1 % Apply 1 Application topically 2 (two) times daily.     Naftifine HCl (NAFTIN) 2 % GEL Apply to affected area qd-bid (Patient not taking: Reported on 06/13/2022) 60 g 3   No facility-administered medications prior to visit.    Allergies  Allergen Reactions   Chlorthalidone     hyponatremia   Atenolol     Caused fatigue    Besivance [Besifloxacin Hcl]     rash   Codeine Nausea And Vomiting   Crestor [Rosuvastatin]     "leg gave away"   Gabapentin     Memory loss and confusion   Lipitor [Atorvastatin] Other (See Comments)    Severe leg pain   Lovastatin     myalgia   Simvastatin     Her leg gave away on her and states thinks secondary to Simvastatin   Statins Support [Acid Blockers Support]     Other reaction(s): Unknown   Sulfa Antibiotics Nausea Only   Vigamox [Moxifloxacin]     rash   Welchol [Colesevelam Hcl]     Review of Systems As per HPI  PE:    06/13/2022    8:38 AM 02/26/2022    9:28 AM 12/13/2021    8:28 AM  Vitals with BMI  Height 5\' 0"  5\' 0"  5\' 0"   Weight 134 lbs 131 lbs 8 oz 132 lbs  BMI 26.17 [id number]  Systolic [id number] 0000000  Diastolic 68 72 71  Pulse 68 72 68  Initial bp 143/68 Rpt 132/70  Physical Exam  Gen: Alert, well appearing.  Patient is oriented to person,  place, time, and situation. AFFECT: anxious but pleasant, lucid thought and speech. CV: RRR, occasional premature beat, no m/r/g.   LUNGS: CTA bilat, nonlabored resps, good aeration in all lung fields. Extremities: Trace pitting edema at the most, bilateral.  For the most part she has nonpitting edema that is mild.  Pretibial deep  pink macular rash, left greater than right, sensitive to palpation, slightly warm.  No skin breakdown.  LABS:  Last CBC Lab Results  Component Value Date   WBC 5.7 05/04/2021   HGB 14.2 05/04/2021   HCT 43.3 05/04/2021   MCV 94.3 05/04/2021   MCH 31.8 08/17/2020   RDW 13.5 05/04/2021   PLT 177.0 0000000   Last metabolic panel Lab Results  Component Value Date   GLUCOSE 97 12/13/2021   NA 137 12/13/2021   K 3.9 12/13/2021   CL 101 12/13/2021   CO2 27 12/13/2021   BUN 14 12/13/2021   CREATININE 0.73 12/13/2021   GFRNONAA 73 09/29/2019   CALCIUM 9.6 12/13/2021   PHOS 3.5 05/07/2012   PROT 7.3 05/04/2021   ALBUMIN 4.7 05/04/2021   LABGLOB 2.8 09/29/2019   AGRATIO 1.6 09/29/2019   BILITOT 0.6 05/04/2021   ALKPHOS 62 05/04/2021   AST 11 05/04/2021   ALT 13 05/04/2021   ANIONGAP 11 05/02/2015   Last lipids Lab Results  Component Value Date   CHOL 234 (H) 09/29/2019   HDL 72 09/29/2019   LDLCALC 146 (H) 09/29/2019   LDLDIRECT 128.0 12/02/2012   TRIG 95 09/29/2019   CHOLHDL 3.3 09/29/2019   Last hemoglobin A1c Lab Results  Component Value Date   HGBA1C  09/03/2009    5.4 (NOTE)                                                                       According to the ADA Clinical Practice Recommendations for 2011, when HbA1c is used as a screening test:   >=6.5%   Diagnostic of Diabetes Mellitus           (if abnormal result  is confirmed)  5.7-6.4%   Increased risk of developing Diabetes Mellitus  References:Diagnosis and Classification of Diabetes Mellitus,Diabetes [id number] 1):S62-S69 and Standards of Medical Care in          Diabetes - 2011,Diabetes Care,2011,34  (Suppl 1):S11-S61.   Last thyroid functions Lab Results  Component Value Date   TSH 1.57 08/17/2020   Last vitamin B12 and Folate Lab Results  Component Value Date   [id number] 08/17/2020   FOLATE >24.8 11/10/2010   IMPRESSION AND PLAN:  #1 bilateral lower extremity stasis dermatitis, possibly some cellulitis. Cutivate 0.05 cream to apply twice daily. Keflex 500 mg twice daily x 7 days.  2.  Hypertension, well-controlled on amlodipine 5 mg a day, enalapril 20 mg every morning and 10 mg q. PM, and Toprol-XL 25 mg a day.  An After Visit Summary was printed and given to the patient.  FOLLOW UP: No follow-ups on file.  Signed:  Crissie Sickles, MD           06/13/2022

## 2022-06-14 ENCOUNTER — Telehealth: Payer: Self-pay | Admitting: Family Medicine

## 2022-06-14 NOTE — Telephone Encounter (Signed)
Provided patient with results and she understood and had no additional concerns or questions.

## 2022-06-19 ENCOUNTER — Ambulatory Visit (HOSPITAL_BASED_OUTPATIENT_CLINIC_OR_DEPARTMENT_OTHER): Payer: Medicare Other | Admitting: Family

## 2022-06-20 ENCOUNTER — Ambulatory Visit (INDEPENDENT_AMBULATORY_CARE_PROVIDER_SITE_OTHER): Payer: Medicare Other | Admitting: Podiatry

## 2022-06-20 ENCOUNTER — Encounter: Payer: Self-pay | Admitting: Podiatry

## 2022-06-20 DIAGNOSIS — Q828 Other specified congenital malformations of skin: Secondary | ICD-10-CM | POA: Diagnosis not present

## 2022-06-20 DIAGNOSIS — M79674 Pain in right toe(s): Secondary | ICD-10-CM | POA: Diagnosis not present

## 2022-06-20 DIAGNOSIS — G629 Polyneuropathy, unspecified: Secondary | ICD-10-CM | POA: Diagnosis not present

## 2022-06-20 DIAGNOSIS — M79675 Pain in left toe(s): Secondary | ICD-10-CM | POA: Diagnosis not present

## 2022-06-20 DIAGNOSIS — B351 Tinea unguium: Secondary | ICD-10-CM

## 2022-06-20 DIAGNOSIS — L84 Corns and callosities: Secondary | ICD-10-CM | POA: Diagnosis not present

## 2022-06-20 NOTE — Patient Instructions (Signed)
Edema  Edema is an abnormal buildup of fluids in the body tissues and under the skin. Swelling of the legs, feet, and ankles is a common symptom that becomes more likely as you get older. Swelling is also common in looser tissues, such as around the eyes. Pressing on the area may make a temporary dent in your skin (pitting edema). This fluid may also accumulate in your lungs (pulmonary edema). There are many possible causes of edema. Eating too much salt (sodium) and being on your feet or sitting for a long time can cause edema in your legs, feet, and ankles. Common causes of edema include: Certain medical conditions, such as heart failure, liver or kidney disease, and cancer. Weak leg blood vessels. An injury. Pregnancy. Medicines. Being obese. Low protein levels in the blood. Hot weather may make edema worse. Edema is usually painless. Your skin may look swollen or shiny. Follow these instructions at home: Medicines Take over-the-counter and prescription medicines only as told by your health care provider. Your health care provider may prescribe a medicine to help your body get rid of extra water (diuretic). Take this medicine if you are told to take it. Eating and drinking Eat a low-salt (low-sodium) diet to reduce fluid as told by your health care provider. Sometimes, eating less salt may reduce swelling. Depending on the cause of your swelling, you may need to limit how much fluid you drink (fluid restriction). General instructions Raise (elevate) the injured area above the level of your heart while you are sitting or lying down. Do not sit still or stand for long periods of time. Do not wear tight clothing. Do not wear garters on your upper legs. Exercise your legs to get your circulation going. This helps to move the fluid back into your blood vessels, and it may help the swelling go down. Wear compression stockings as told by your health care provider. These stockings help to prevent  blood clots and reduce swelling in your legs. It is important that these are the correct size. These stockings should be prescribed by your health care provider to prevent possible injuries. If elastic bandages or wraps are recommended, use them as told by your health care provider. Contact a health care provider if: Your edema does not get better with treatment. You have heart, liver, or kidney disease and have symptoms of edema. You have sudden and unexplained weight gain. Get help right away if: You develop shortness of breath or chest pain. You cannot breathe when you lie down. You develop pain, redness, or warmth in the swollen areas. You have heart, liver, or kidney disease and suddenly get edema. You have a fever and your symptoms suddenly get worse. These symptoms may be an emergency. Get help right away. Call 911. Do not wait to see if the symptoms will go away. Do not drive yourself to the hospital. Summary Edema is an abnormal buildup of fluids in the body tissues and under the skin. Eating too much salt (sodium)and being on your feet or sitting for a long time can cause edema in your legs, feet, and ankles. Raise (elevate) the injured area above the level of your heart while you are sitting or lying down. Follow your health care provider's instructions about diet and how much fluid you can drink. This information is not intended to replace advice given to you by your health care provider. Make sure you discuss any questions you have with your health care provider. Document Revised: 11/07/2020 Document   Reviewed: 11/07/2020 Elsevier Patient Education  2023 [employer] Inc[employer]  

## 2022-06-21 NOTE — Progress Notes (Signed)
Subjective:  Patient ID: Jennifer Peck, female    DOB: 11-May-1922,  MRN: [id number]  Jennifer Peck presents to clinic today for at risk foot care with history of peripheral neuropathy. Patient concerned about appearance of her legs. She sees Dermatology and has an appointment next month.  Chief Complaint  Patient presents with   Nail Problem, corns and painful lesion right foot    [facility] PCP-McG[facility]en PCP [facility]-Last week   New problem(s): None.   PCP is McGowen, Adrian Blackwater, MD.  Allergies  Allergen Reactions   Chlorthalidone     hyponatremia   Atenolol     Caused fatigue    Besivance [Besifloxacin Hcl]     rash   Codeine Nausea And Vomiting   Crestor [Rosuvastatin]     "leg gave away"   Gabapentin     Memory loss and confusion   Lipitor [Atorvastatin] Other (See Comments)    Severe leg pain   Lovastatin     myalgia   Simvastatin     Her leg gave away on her and states thinks secondary to Simvastatin   Statins Support [Acid Blockers Support]     Other reaction(s): Unknown   Sulfa Antibiotics Nausea Only   Vigamox [Moxifloxacin]     rash   Welchol [Colesevelam Hcl]     Review of Systems: Negative except as noted in the HPI.  Objective:  There were no vitals filed for this visit. Jennifer Peck is a pleasant 89 y.o. female WD, WN in NAD. AAO x 3.  Vascular CFT <3 seconds b/l LE. Faintly palpable DP pulses b/l LE. Faintly palpable PT pulse(s) b/l LE. Pedal hair absent. No pain with calf compression b/l. Lower extremity skin temperature gradient warm to cool.  Evidence of chronic venous insufficiency b/l LE with chronic lower extremity edema. No cyanosis or clubbing noted b/l LE.  Neurologic Normal speech. Oriented to person, place, and time. Protective sensation diminished with 10g monofilament b/l.  Dermatologic Pedal integument with normal turgor, texture and tone BLE. Toenails 1-5 b/l elongated, discolored, dystrophic, thickened, crumbly with subungual debris and tenderness  to dorsal palpation.   Porokeratotic  lesion(s) submet head 5 right foot.  No erythema, no edema, no drainage, no fluctuance.   Hyperkeratotic lesion(s) distal tip of right 3rd toe and medial aspect left 3rd toe. No erythema, no edema, no drainage, no fluctuance.  Orthopedic: Noted disuse atrophy bilaterally. No pain, crepitus or joint limitation noted with ROM bilateral LE. Hammertoe(s) noted to the L 2nd toe, L 3rd toe, L 4th toe, R 2nd toe, R 3rd toe, and R 4th toe.   Radiographs: None  Assessment/Plan: 1. Pain due to onychomycosis of toenails of both feet   2. Porokeratosis   3. Corns   4. Neuropathy     -Patient was evaluated and treated. All patient's and/or POA's questions/concerns answered on today's visit. -Discussed lower extremity edema and need for compression hose which patient states she cannot apply. Discussed other methodologies such as leg wraps which can be applied by a home health nurse. Patient advised to discuss with her PCP. -Toenails 1-5 b/l were debrided in length and girth with sterile nail nippers and dremel without iatrogenic bleeding.  -Corn(s) bilateral 3rd toes pared utilizing sharp debridement with sterile blade without complication or incident. Total number debrided=2. -Porokeratotic lesion(s) submet head 5 right foot pared and enucleated with sterile currette without incident. Total number of lesions debrided=1. -Patient/POA to call should there be question/concern in the interim.  Return in about 3 months (around 09/19/2022).  Marzetta Board, DPM

## 2022-06-22 ENCOUNTER — Encounter: Payer: Self-pay | Admitting: Family Medicine

## 2022-06-22 ENCOUNTER — Ambulatory Visit (INDEPENDENT_AMBULATORY_CARE_PROVIDER_SITE_OTHER): Payer: Medicare Other | Admitting: Family Medicine

## 2022-06-22 VITALS — BP 132/77 | HR 72 | Wt 132.0 lb

## 2022-06-22 DIAGNOSIS — I890 Lymphedema, not elsewhere classified: Secondary | ICD-10-CM

## 2022-06-22 DIAGNOSIS — R600 Localized edema: Secondary | ICD-10-CM

## 2022-06-22 DIAGNOSIS — I872 Venous insufficiency (chronic) (peripheral): Secondary | ICD-10-CM

## 2022-06-22 MED ORDER — FUROSEMIDE 20 MG PO TABS: ORAL_TABLET | ORAL | 0 refills | Status: DC

## 2022-06-22 MED ORDER — FLUTICASONE PROPIONATE 0.05 % EX CREA: TOPICAL_CREAM | CUTANEOUS | 1 refills | Status: AC

## 2022-06-22 NOTE — Progress Notes (Signed)
OFFICE VISIT  06/22/2022  CC:  Chief Complaint  Patient presents with   Follow-up    Follow up. She has fluid coming from both legs.    Patient is a 89 y.o. female who presents accompanied by her daughter for 1 week follow-up lower extremity venous stasis dermatitis with some possible cellulitis.   A/P as of last visit: "1 bilateral lower extremity stasis dermatitis, possibly some cellulitis. Cutivate 0.05 cream to apply twice daily. Keflex 500 mg twice daily x 7 days.   2.  Hypertension, well-controlled on amlodipine 5 mg a day, enalapril 20 mg every morning and 10 mg q. PM, and Toprol-XL 25 mg a day."  INTERIM HX: Pretibial rash seems a little bit better. She has noticed a couple of times some fluid on the skin of her lower legs. She has a long history of lymphedema. She eats lots of sodium in her diet---TV dinners.   Past Medical History:  Diagnosis Date   Allergic state 04/30/2011   Anxiety    BCC (basal cell carcinoma), face 11/06/2010   Bilateral bunions 11/06/2010   Chronic venous insufficiency    lymphedema + venous stasis pigmentation changes   Constipation 11/06/2010   Coronary artery disease 2011   BMS x 3   Diastolic heart failure    GERD (gastroesophageal reflux disease) 05/11/2012   History of hyperkalemia 01/23/2011   Hyperlipidemia    Intolerant of statins and zetia.  Refused to consider PCSK9 inhibitor initially but as of 02/2017 cardiol f/u she would consider repatha if approved.   Hypertension    +white coat component   Lesion of labia 02/04/2012   Melanoma in situ 08/2016   Derm= Dr[facility]ull [facility] [place].   Mitral regurgitation 12/2020   moderate by echo 12/2020   Osteoarthritis of both knees    R>L   Osteopenia 11/23/2010; 02/2016   2017 T-score -1.9.  Repeat DEXA 2 yrs.   Peripheral neuropathy 11/06/2010   Small fiber, non-diabetic per neurologist   Right lumbar radiculopathy 01/2015   Improved with PT   SCC (squamous  cell carcinoma), arm 11/06/2010   arm 2012.  R lower leg 10/2020   Seborrheic keratosis 04/30/2011   Vertigo     Past Surgical History:  Procedure Laterality Date   CARDIAC CATHETERIZATION  12/20/2009   3 bare metal stents   DEXA  03/05/2016   T-score -1.9   EYE SURGERY     cataract- left eye 09-24-11   LE arterial dopplers     03/2022 NORMAL   SKIN BIOPSY  08/2012   Shave excision of lesion on pt's back: irritated seb keratosis.   TRANSTHORACIC ECHOCARDIOGRAM  12/05/2009   Mild LVH, normal systolic fxn, mild impaired relaxation.  No signif valvular dz. 12/01/20->EF 60-65%, grd II DD, mod MR, +mild pulm art htn/   TUBAL LIGATION      Outpatient Medications Prior to Visit  Medication Sig Dispense Refill   amLODipine (NORVASC) 5 MG tablet Take 1 tablet (5 mg total) by mouth daily. 90 tablet 1   azelastine (OPTIVAR) 0.05 % ophthalmic solution INSTILL TWO DROPS INTO EACH EYE TWICE DAILY 6 mL 12   B Complex Vitamins (B COMPLEX-B12 PO) Take by mouth daily.     cephALEXin (KEFLEX) 500 MG capsule Take 1 capsule (500 mg total) by mouth 2 (two) times daily. 14 capsule 0   enalapril (VASOTEC) 20 MG tablet 1 tab po qAM and 1/2 tab po qhs 135 tablet 1  fexofenadine (ALLEGRA) 60 MG tablet TAKE 1 TABLET BY MOUTH TWICE A DAY FOR ITCHING 180 tablet 1   fluocinonide (LIDEX) 0.05 % external solution Apply 1 application topically daily as needed.     fluorouracil (EFUDEX) 5 % cream Apply to affected area once daily 40 g 0   Fluorouracil 4 % CREA Apply to affected areas once daily 40 g 1   fluticasone (CUTIVATE) 0.05 % cream Apply to affected areas of lower legs twice a day 30 g 1   magnesium oxide (MAG-OX) 400 MG tablet Take 400 mg by mouth once a week.     metoprolol succinate (TOPROL-XL) 25 MG 24 hr tablet Take 25 mg by mouth daily.     Multiple Vitamin (MULTIVITAMIN) tablet Take 1 tablet by mouth once a week. Takes occ     Omega-3 Fatty Acids (FISH OIL PO) Take 1 capsule by mouth once a week.      triamcinolone cream (KENALOG) 0.1 % Apply 1 Application topically 2 (two) times daily.     Naftifine HCl (NAFTIN) 2 % GEL Apply to affected area qd-bid (Patient not taking: Reported on 06/13/2022) 60 g 3   No facility-administered medications prior to visit.    Allergies  Allergen Reactions   Chlorthalidone     hyponatremia   Atenolol     Caused fatigue    Besivance [Besifloxacin Hcl]     rash   Codeine Nausea And Vomiting   Crestor [Rosuvastatin]     "leg gave away"   Gabapentin     Memory loss and confusion   Lipitor [Atorvastatin] Other (See Comments)    Severe leg pain   Lovastatin     myalgia   Simvastatin     Her leg gave away on her and states thinks secondary to Simvastatin   Statins Support [Acid Blockers Support]     Other reaction(s): Unknown   Sulfa Antibiotics Nausea Only   Vigamox [Moxifloxacin]     rash   Welchol [Colesevelam Hcl]     Review of Systems As per HPI  PE:    06/22/2022   10:30 AM 06/22/2022   10:24 AM 06/13/2022    8:55 AM  Vitals with BMI  Weight  132 lbs   BMI  25.78   Systolic 132 146 [id number]  Diastolic 77 81 70  Pulse  72      Physical Exam  General: Alert and well-appearing. Both lower legs with diffuse hyperkeratotic/fibrotic skin changes and pigmentation variation.  Deep pink discoloration left greater than right pretibial surface.  No erythema or warmth.  She has no pitting edema.  Right calf circumference 10 cm below the inferior border of the patella is 41 cm.  Left side 40 cm.  No weeping is present.  No skin breakdown at all. CV: RRR, no m/r/g.   LUNGS: CTA bilat, nonlabored resps, good aeration in all lung fields.  LABS:  Last CBC Lab Results  Component Value Date   WBC 5.7 05/04/2021   HGB 14.2 05/04/2021   HCT 43.3 05/04/2021   MCV 94.3 05/04/2021   MCH 31.8 08/17/2020   RDW 13.5 05/04/2021   PLT 177.0 05/04/2021   Last metabolic panel Lab Results  Component Value Date   GLUCOSE 97 06/13/2022   NA 136  06/13/2022   K 5.1 06/13/2022   CL 100 06/13/2022   CO2 28 06/13/2022   BUN 21 06/13/2022   CREATININE 0.76 06/13/2022   GFRNONAA 73 09/29/2019   CALCIUM 9.7 06/13/2022  PHOS 3.5 05/07/2012   PROT 7.3 05/04/2021   ALBUMIN 4.7 05/04/2021   LABGLOB 2.8 09/29/2019   AGRATIO 1.6 09/29/2019   BILITOT 0.6 05/04/2021   ALKPHOS 62 05/04/2021   AST 11 05/04/2021   ALT 13 05/04/2021   ANIONGAP 11 05/02/2015   Last lipids Lab Results  Component Value Date   CHOL 234 (H) 09/29/2019   HDL 72 09/29/2019   LDLCALC 146 (H) 09/29/2019   LDLDIRECT 128.0 12/02/2012   TRIG 95 09/29/2019   CHOLHDL 3.3 09/29/2019   Last hemoglobin A1c Lab Results  Component Value Date   HGBA1C  09/03/2009    5.4 (NOTE)                                                                       According to the ADA Clinical Practice Recommendations for 2011, when HbA1c is used as a screening test:   >=6.5%   Diagnostic of Diabetes Mellitus           (if abnormal result  is confirmed)  5.7-6.4%   Increased risk of developing Diabetes Mellitus  References:Diagnosis and Classification of Diabetes Mellitus,Diabetes Care,2011,34(Suppl 1):S62-S69 and Standards of Medical Care in         Diabetes - 2011,Diabetes Care,2011,34  (Suppl 1):S11-S61.   Last thyroid functions Lab Results  Component Value Date   TSH 1.57 08/17/2020   Last vitamin B12 and Folate Lab Results  Component Value Date   VITAMINB12 314 08/17/2020   FOLATE >24.8 11/10/2010   IMPRESSION AND PLAN:  #1 lymphedema with recent acute on chronic stasis dermatitis. Discussed the importance of making a marked decrease in the sodium intake and she will start making efforts at this. Continue the Cutivate to the pretibial surfaces. Start Lasix 20 mg every morning daily for the next 7 days and I will recheck her at that time.  Will repeat BMET at that time as well. She has compression socks but she is unable to pull them up.  An After Visit Summary was  printed and given to the patient.  FOLLOW UP: No follow-ups on file.  Signed:  Santiago BumpersPhil Aleira Deiter, MD           06/22/2022

## 2022-06-28 ENCOUNTER — Ambulatory Visit (HOSPITAL_BASED_OUTPATIENT_CLINIC_OR_DEPARTMENT_OTHER): Payer: Medicare Other | Admitting: Family

## 2022-06-29 ENCOUNTER — Ambulatory Visit (INDEPENDENT_AMBULATORY_CARE_PROVIDER_SITE_OTHER): Payer: Medicare Other | Admitting: Family Medicine

## 2022-06-29 ENCOUNTER — Encounter: Payer: Self-pay | Admitting: Family Medicine

## 2022-06-29 VITALS — BP 127/72 | HR 75 | Wt 131.2 lb

## 2022-06-29 DIAGNOSIS — L299 Pruritus, unspecified: Secondary | ICD-10-CM | POA: Diagnosis not present

## 2022-06-29 DIAGNOSIS — I890 Lymphedema, not elsewhere classified: Secondary | ICD-10-CM | POA: Diagnosis not present

## 2022-06-29 NOTE — Progress Notes (Signed)
OFFICE VISIT  06/29/2022  CC:  Chief Complaint  Patient presents with   Follow-up    1 week follow up. No other questions or concerns    Patient is a 89 y.o. female who presents for 1 week follow-up lymphedema. A/P as of last visit: "#1 lymphedema with recent acute on chronic stasis dermatitis. Discussed the importance of making a marked decrease in the sodium intake and she will start making efforts at this. Continue the Cutivate to the pretibial surfaces. Start Lasix 20 mg every morning daily for the next 7 days and I will recheck her at that time.  Will repeat BMET at that time as well. She has compression socks but she is unable to pull them up."  INTERIM HX: Swelling improved some.  Itching LL's and other areas ongoing, allegra helps.  No rashes except deep pink issue to anterior lower legs--unchanged recently. She uses cutivate in large amounts regularly.  ROS as above, plus--> no fevers, no CP, no SOB, no wheezing, no cough, no dizziness, no HAs, no melena/hematochezia.  No polyuria or polydipsia.  No myalgias or arthralgias.  No focal weakness, paresthesias, or tremors.  No acute vision or hearing abnormalities.  No dysuria or unusual/new urinary urgency or frequency.    Past Medical History:  Diagnosis Date   Allergic state 04/30/2011   Anxiety    BCC (basal cell carcinoma), face 11/06/2010   Bilateral bunions 11/06/2010   Chronic venous insufficiency    lymphedema + venous stasis pigmentation changes   Constipation 11/06/2010   Coronary artery disease 2011   BMS x 3   Diastolic heart failure    GERD (gastroesophageal reflux disease) 05/11/2012   History of hyperkalemia 01/23/2011   Hyperlipidemia    Intolerant of statins and zetia.  Refused to consider PCSK9 inhibitor initially but as of 02/2017 cardiol f/u she would consider repatha if approved.   Hypertension    +white coat component   Lesion of labia 02/04/2012   Melanoma in situ 08/2016   Derm= Dr.  Sh[facility]ull [facility] [place].   Mitral regurgitation 12/2020   moderate by echo 12/2020   Osteoarthritis of both knees    R>L   Osteopenia 11/23/2010; 02/2016   2017 T-score -1.9.  Repeat DEXA 2 yrs.   Peripheral neuropathy 11/06/2010   Small fiber, non-diabetic per neurologist   Right lumbar radiculopathy 01/2015   Improved with PT   SCC (squamous cell carcinoma), arm 11/06/2010   arm 2012.  R lower leg 10/2020   Seborrheic keratosis 04/30/2011   Vertigo     Past Surgical History:  Procedure Laterality Date   CARDIAC CATHETERIZATION  12/20/2009   3 bare metal stents   DEXA  03/05/2016   T-score -1.9   EYE SURGERY     cataract- left eye 09-24-11   LE arterial dopplers     03/2022 NORMAL   SKIN BIOPSY  08/2012   Shave excision of lesion on pt's back: irritated seb keratosis.   TRANSTHORACIC ECHOCARDIOGRAM  12/05/2009   Mild LVH, normal systolic fxn, mild impaired relaxation.  No signif valvular dz. 12/01/20->EF 60-65%, grd II DD, mod MR, +mild pulm art htn/   TUBAL LIGATION      Outpatient Medications Prior to Visit  Medication Sig Dispense Refill   amLODipine (NORVASC) 5 MG tablet Take 1 tablet (5 mg total) by mouth daily. 90 tablet 1   azelastine (OPTIVAR) 0.05 % ophthalmic solution INSTILL TWO DROPS INTO EACH EYE TWICE DAILY 6 mL  12   B Complex Vitamins (B COMPLEX-B12 PO) Take by mouth daily.     cephALEXin (KEFLEX) 500 MG capsule Take 1 capsule (500 mg total) by mouth 2 (two) times daily. 14 capsule 0   enalapril (VASOTEC) 20 MG tablet 1 tab po qAM and 1/2 tab po qhs 135 tablet 1   fexofenadine (ALLEGRA) 60 MG tablet TAKE 1 TABLET BY MOUTH TWICE A DAY FOR ITCHING 180 tablet 1   fluocinonide (LIDEX) 0.05 % external solution Apply 1 application topically daily as needed.     fluorouracil (EFUDEX) 5 % cream Apply to affected area once daily 40 g 0   Fluorouracil 4 % CREA Apply to affected areas once daily 40 g 1   fluticasone (CUTIVATE) 0.05 % cream Apply to  affected areas of lower legs twice a day 60 g 1   furosemide (LASIX) 20 MG tablet 1 tab every morning 7 tablet 0   magnesium oxide (MAG-OX) 400 MG tablet Take 400 mg by mouth once a week.     metoprolol succinate (TOPROL-XL) 25 MG 24 hr tablet Take 25 mg by mouth daily.     Multiple Vitamin (MULTIVITAMIN) tablet Take 1 tablet by mouth once a week. Takes occ     Omega-3 Fatty Acids (FISH OIL PO) Take 1 capsule by mouth once a week.     triamcinolone cream (KENALOG) 0.1 % Apply 1 Application topically 2 (two) times daily.     Naftifine HCl (NAFTIN) 2 % GEL Apply to affected area qd-bid (Patient not taking: Reported on 06/29/2022) 60 g 3   No facility-administered medications prior to visit.    Allergies  Allergen Reactions   Chlorthalidone     hyponatremia   Atenolol     Caused fatigue    Besivance [Besifloxacin Hcl]     rash   Codeine Nausea And Vomiting   Crestor [Rosuvastatin]     "leg gave away"   Gabapentin     Memory loss and confusion   Lipitor [Atorvastatin] Other (See Comments)    Severe leg pain   Lovastatin     myalgia   Simvastatin     Her leg gave away on her and states thinks secondary to Simvastatin   Statins Support [Acid Blockers Support]     Other reaction(s): Unknown   Sulfa Antibiotics Nausea Only   Vigamox [Moxifloxacin]     rash   Welchol [Colesevelam Hcl]     Review of Systems As per HPI  PE:    06/29/2022    2:00 PM 06/22/2022   10:30 AM 06/22/2022   10:24 AM  Vitals with BMI  Weight 131 lbs 3 oz  132 lbs  BMI 25.62  25.78  Systolic 127 132 161  Diastolic 72 77 81  Pulse 75  72     Physical Exam  Gen: Alert, well appearing.  Patient is oriented to person, place, time, and situation. AFFECT: pleasant, lucid thought and speech. Legs trace to 1+ bilateral lower extremity pitting edema.  She has a mild amount of nonpitting edema.  No tenderness.  No erythema.  She has mild diffuse pink and fibrotic skin changes to the anterior aspect of the  lower legs.  LABS:  Last metabolic panel Lab Results  Component Value Date   GLUCOSE 97 06/13/2022   NA 136 06/13/2022   K 5.1 06/13/2022   CL 100 06/13/2022   CO2 28 06/13/2022   BUN 21 06/13/2022   CREATININE 0.76 06/13/2022   GFRNONAA 73  09/29/2019   CALCIUM 9.7 06/13/2022   PHOS 3.5 05/07/2012   PROT 7.3 05/04/2021   ALBUMIN 4.7 05/04/2021   LABGLOB 2.8 09/29/2019   AGRATIO 1.6 09/29/2019   BILITOT 0.6 05/04/2021   ALKPHOS 62 05/04/2021   AST 11 05/04/2021   ALT 13 05/04/2021   ANIONGAP 11 05/02/2015   IMPRESSION AND PLAN:  #1 lymphedema.  Improved with the daily dose of Lasix 20 mg. Recheck basic metabolic panel today. If electrolytes and creatinine are stable then will continue her on a 20 mg dose daily.  #2 urticaria, chronic. Unknown etiology. Her lower legs do have venous stasis skin changes but currently only minimal. We discussed minimizing use of Cutivate to avoid tachyphylaxis as well as avoid systemic steroid effects. She will continue Allegra 60 mg twice a day as needed.  An After Visit Summary was printed and given to the patient.  FOLLOW UP: No follow-ups on file.  Signed:  Santiago Bumpers, MD           06/29/2022

## 2022-06-30 LAB — BASIC METABOLIC PANEL
BUN/Creatinine Ratio: 37 (calc) — ABNORMAL HIGH (ref 6–22)
BUN: 30 mg/dL — ABNORMAL HIGH (ref 7–25)
CO2: 23 mmol/L (ref 20–32)
Calcium: 9.6 mg/dL (ref 8.6–10.4)
Chloride: 99 mmol/L (ref 98–110)
Creat: 0.82 mg/dL (ref 0.60–0.95)
Glucose, Bld: 123 mg/dL — ABNORMAL HIGH (ref 65–99)
Potassium: 4.4 mmol/L (ref 3.5–5.3)
Sodium: 136 mmol/L (ref 135–146)

## 2022-07-02 ENCOUNTER — Ambulatory Visit (HOSPITAL_BASED_OUTPATIENT_CLINIC_OR_DEPARTMENT_OTHER): Payer: Medicare Other | Admitting: Family

## 2022-07-02 ENCOUNTER — Other Ambulatory Visit: Payer: Self-pay | Admitting: Family Medicine

## 2022-07-02 MED ORDER — FUROSEMIDE 20 MG PO TABS: ORAL_TABLET | ORAL | 5 refills | Status: DC

## 2022-07-11 ENCOUNTER — Ambulatory Visit (INDEPENDENT_AMBULATORY_CARE_PROVIDER_SITE_OTHER): Payer: Medicare Other

## 2022-07-11 VITALS — Wt 131.0 lb

## 2022-07-11 DIAGNOSIS — Z0000 Encounter for general adult medical examination without abnormal findings: Secondary | ICD-10-CM

## 2022-07-11 NOTE — Progress Notes (Signed)
I connected with  Jennifer Peck on 07/11/22 by a audio enabled telemedicine application and verified that I am speaking with the correct person using two identifiers.  Patient Location: Home  Provider Location: Home Office  I discussed the limitations of evaluation and management by telemedicine. The patient expressed understanding and agreed to proceed.   Subjective:   Jennifer Peck is a 89 y.o. female who presents for Medicare Annual (Subsequent) preventive examination.  Review of Systems     Cardiac Risk Factors include: advanced age (>38men, >36 women);hypertension;dyslipidemia     Objective:    Today's Vitals   07/11/22 0949  Weight: 131 lb (59.4 kg)   Body mass index is 25.58 kg/m.     07/11/2022    9:58 AM 05/17/2021    9:51 AM 02/17/2020   11:18 AM 01/08/2019   10:32 AM 05/06/2017   10:27 AM 04/30/2016   10:41 AM 05/02/2015   11:12 PM  Advanced Directives  Does Patient Have a Medical Advance Directive? Yes Yes Yes Yes Yes Yes No  Type of Estate agent of [place];Living will Healthcare Power of [place] Living will Healthcare Power of [place];Living will Living will;Healthcare Power of Attorney Living will   Copy of Healthcare Power of Attorney in Chart? No - copy requested No - copy requested  No - copy requested No - copy requested      Current Medications (verified) Outpatient Encounter Medications as of 07/11/2022  Medication Sig   amLODipine (NORVASC) 5 MG tablet Take 1 tablet (5 mg total) by mouth daily.   azelastine (OPTIVAR) 0.05 % ophthalmic solution INSTILL TWO DROPS INTO EACH EYE TWICE DAILY   B Complex Vitamins (B COMPLEX-B12 PO) Take by mouth daily.   enalapril (VASOTEC) 20 MG tablet 1 tab po qAM and 1/2 tab po qhs   fexofenadine (ALLEGRA) 60 MG tablet TAKE 1 TABLET BY MOUTH TWICE A DAY FOR ITCHING   furosemide (LASIX) 20 MG tablet 1 tab every morning   magnesium oxide (MAG-OX) 400 MG tablet Take 400 mg by mouth once a week.    metoprolol succinate (TOPROL-XL) 25 MG 24 hr tablet Take 25 mg by mouth daily.   Multiple Vitamin (MULTIVITAMIN) tablet Take 1 tablet by mouth once a week. Takes occ   Omega-3 Fatty Acids (FISH OIL PO) Take 1 capsule by mouth once a week.   fluocinonide (LIDEX) 0.05 % external solution Apply 1 application topically daily as needed. (Patient not taking: Reported on 07/11/2022)   fluorouracil (EFUDEX) 5 % cream Apply to affected area once daily (Patient not taking: Reported on 07/11/2022)   Fluorouracil 4 % CREA Apply to affected areas once daily (Patient not taking: Reported on 07/11/2022)   fluticasone (CUTIVATE) 0.05 % cream Apply to affected areas of lower legs twice a day (Patient not taking: Reported on 07/11/2022)   Naftifine HCl (NAFTIN) 2 % GEL Apply to affected area qd-bid (Patient not taking: Reported on 07/11/2022)   triamcinolone cream (KENALOG) 0.1 % Apply 1 Application topically 2 (two) times daily. (Patient not taking: Reported on 07/11/2022)   [DISCONTINUED] cephALEXin (KEFLEX) 500 MG capsule Take 1 capsule (500 mg total) by mouth 2 (two) times daily.   No facility-administered encounter medications on file as of 07/11/2022.    Allergies (verified) Chlorthalidone, Atenolol, Besivance [besifloxacin hcl], Codeine, Crestor [rosuvastatin], Gabapentin, Lipitor [atorvastatin], Lovastatin, Simvastatin, Statins support [acid blockers support], Sulfa antibiotics, Vigamox [moxifloxacin], and Welchol [colesevelam hcl]   History: Past Medical History:  Diagnosis Date  Allergic state 04/30/2011   Anxiety    BCC (basal cell carcinoma), face 11/06/2010   Bilateral bunions 11/06/2010   Chronic venous insufficiency    lymphedema + venous stasis pigmentation changes   Constipation 11/06/2010   Coronary artery disease 2011   BMS x 3   Diastolic heart failure    GERD (gastroesophageal reflux disease) 05/11/2012   History of hyperkalemia 01/23/2011   Hyperlipidemia    Intolerant of statins and  zetia.  Refused to consider PCSK9 inhibitor initially but as of 02/2017 cardiol f/u she would consider repatha if approved.   Hypertension    +white coat component   Lesion of labia 02/04/2012   Melanoma in situ 08/2016   Derm= Dr[facility] Sh[facility]ull [facility] [place].   Mitral regurgitation 12/2020   moderate by echo 12/2020   Osteoarthritis of both knees    R>L   Osteopenia 11/23/2010; 02/2016   2017 T-score -1.9.  Repeat DEXA 2 yrs.   Peripheral neuropathy 11/06/2010   Small fiber, non-diabetic per neurologist   Right lumbar radiculopathy 01/2015   Improved with PT   SCC (squamous cell carcinoma), arm 11/06/2010   arm 2012.  R lower leg 10/2020   Seborrheic keratosis 04/30/2011   Vertigo    Past Surgical History:  Procedure Laterality Date   CARDIAC CATHETERIZATION  12/20/2009   3 bare metal stents   DEXA  03/05/2016   T-score -1.9   EYE SURGERY     cataract- left eye 09-24-11   LE arterial dopplers     03/2022 NORMAL   SKIN BIOPSY  08/2012   Shave excision of lesion on pt's back: irritated seb keratosis.   TRANSTHORACIC ECHOCARDIOGRAM  12/05/2009   Mild LVH, normal systolic fxn, mild impaired relaxation.  No signif valvular dz. 12/01/20->EF 60-65%, grd II DD, mod MR, +mild pulm art htn/   TUBAL LIGATION     Family History  Problem Relation Age of Onset   Stroke Mother    Heart disease Father    Hypertension Daughter    Hyperlipidemia Daughter    Hypertension Son    Cancer Daughter        rectal/ chemo and radiation   Hypertension Daughter    Hypertension Son    Hyperlipidemia Son    Hypertension Son    Heart disease Brother    Vision loss Maternal Grandfather    Stroke Paternal Grandmother    Asthma Paternal Grandfather    Social History   Socioeconomic History   Marital status: Single    Spouse name: Not on file   Number of children: Not on file   Years of education: Not on file   Highest education level: Not on file  Occupational History   Not on  file  Tobacco Use   Smoking status: Former    Types: Cigarettes    Quit date: 09/04/1977    Years since quitting: 44.8   Smokeless tobacco: Never  Vaping Use   Vaping Use: Never used  Substance and Sexual Activity   Alcohol use: Yes    Comment: only on Holidays   Drug use: No   Sexual activity: Not on file  Other Topics Concern   Not on file  Social History Narrative   Lives alone near [place].  Two daughters in [place], [place].   Widowed about 30 yrs.   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   Former smoker: approx 30 pack-yr hx, quit 1979.   Occasional  social alcohol.   Exercises 3 days a week at [place].   Takes knitting classes and computer classes.   Social Determinants of Health   Financial Resource Strain: Low Risk  (07/11/2022)   Overall Financial Resource Strain (CARDIA)    Difficulty of Paying Living Expenses: Not hard at all  Food Insecurity: No Food Insecurity (07/11/2022)   Hunger Vital Sign    Worried About Running Out of Food in the Last Year: Never true    Ran Out of Food in the Last Year: Never true  Transportation Needs: No Transportation Needs (07/11/2022)   PRAPARE - Administrator, Civil Service (Medical): No    Lack of Transportation (Non-Medical): No  Physical Activity: Sufficiently Active (07/11/2022)   Exercise Vital Sign    Days of Exercise per Week: 3 days    Minutes of Exercise per Session: 60 min  Stress: No Stress Concern Present (07/11/2022)   [employer] of Occupational Health - Occupational Stress Questionnaire    Feeling of Stress : Not at all  Social Connections: Socially Isolated (07/11/2022)   Social Connection and Isolation Panel [NHANES]    Frequency of Communication with Friends and Family: More than three times a week    Frequency of Social Gatherings with Friends and Family: Three times a week    Attends Religious Services: Never    Active Member of Clubs or  Organizations: No    Attends Banker Meetings: Never    Marital Status: Widowed    Tobacco Counseling Counseling given: Not Answered   Clinical Intake:  Pre-visit preparation completed: Yes  Pain : No/denies pain     BMI - recorded: 25.58 Nutritional Status: BMI 25 -29 Overweight Nutritional Risks: None Diabetes: No  How often do you need to have someone help you when you read instructions, pamphlets, or other written materials from your doctor or pharmacy?: 1 - Never  Diabetic?no  Interpreter Needed?: No  Information entered by :: Lanier Ensign, LPN   Activities of Daily Living    07/11/2022   10:00 AM  In your present state of health, do you have any difficulty performing the following activities:  Hearing? 0  Vision? 0  Difficulty concentrating or making decisions? 0  Walking or climbing stairs? 0  Dressing or bathing? 0  Doing errands, shopping? 0  Preparing Food and eating ? N  Using the Toilet? N  In the past six months, have you accidently leaked urine? N  Do you have problems with loss of bowel control? N  Managing your Medications? N  Managing your Finances? N  Housekeeping or managing your Housekeeping? N    Patient Care Team: Jeoffrey Massed, MD as PCP - General (Family Medicine) Chilton Si, MD as PCP - Cardiology (Cardiology) Chilton Si, MD as Consulting Physician (Cardiology) [place], Elvin So, MD as Referring Physician (Dermatology) Elinor Parkinson, [place] as Consulting Physician (Podiatry) Parke Poisson, MD as Consulting Physician (Cardiology)  Indicate any recent Medical Services you may have received from other than [facility] providers in the past year (date may be approximate).     Assessment:   This is a routine wellness examination for [facility].  Hearing/Vision screen Hearing Screening - Comments:: Pt denies hearing issues  Vision Screening - Comments:: Pt follows up with Dr Emily Filbert for annual eye exams    Dietary issues and exercise activities discussed: Current Exercise Habits: Home exercise routine, Type of exercise: Other - see comments, Time (Minutes): 60, Frequency (  Times/Week): 3, Weekly Exercise (Minutes/Week): 180   Goals Addressed             This Visit's Progress    Patient Stated       Decrease salt intake and continue to exercise        Depression Screen    07/11/2022    9:55 AM 06/29/2022    2:04 PM 06/13/2022    8:45 AM 05/17/2021    9:47 AM 02/02/2021    9:45 AM 02/17/2020   11:21 AM 02/09/2020   11:30 AM  PHQ 2/9 Scores  PHQ - 2 Score 0 0 1 0 0 0 0  PHQ- 9 Score 0 1         Fall Risk    07/11/2022    9:59 AM 06/29/2022    2:04 PM 06/13/2022    8:45 AM 05/17/2021    9:52 AM 02/02/2021    9:45 AM  Fall Risk   Falls in the past year? 1 0 1 0 0  Number falls in past yr: 1 0 0 0 0  Injury with Fall? 0 0 0 0 0  Comment fell in the garden no injury      Risk for fall due to : Impaired vision Impaired balance/gait No Fall Risks Impaired vision   Follow up Falls prevention discussed Falls evaluation completed Falls evaluation completed Falls prevention discussed Falls evaluation completed    FALL RISK PREVENTION PERTAINING TO THE HOME:  Any stairs in or around the home? No  If so, are there any without handrails? No  Home free of loose throw rugs in walkways, pet beds, electrical cords, etc? Yes  Adequate lighting in your home to reduce risk of falls? Yes   ASSISTIVE DEVICES UTILIZED TO PREVENT FALLS:  Life alert? No  Use of a cane, walker or w/c? No  Grab bars in the bathroom? Yes  Shower chair or bench in shower? Yes  Elevated toilet seat or a handicapped toilet? No   TIMED UP AND GO:  Was the test performed? No .    Cognitive Function:    04/30/2016   10:43 AM  MMSE - Mini Mental State Exam  Orientation to time 5  Orientation to Place 5  Registration 3  Attention/ Calculation 5  Recall 3  Language- name 2 objects 2  Language- repeat 1   Language- follow 3 step command 3  Language- read & follow direction 1  Write a sentence 1  Copy design 1  Total score 30        07/11/2022   10:01 AM 05/17/2021    9:56 AM  6CIT Screen  What Year? 0 points 0 points  What month? 0 points 0 points  What time? 0 points 0 points  Count back from 20 0 points 0 points  Months in reverse 0 points 0 points  Repeat phrase 0 points 0 points  Total Score 0 points 0 points    Immunizations Immunization History  Administered Date(s) Administered   Covid-19, Mrna,Vaccine(Spikevax)34yrs and older 01/23/2022   Fluad Quad(high Dose 65+) 12/15/2018, 02/09/2020, 02/02/2021, 12/13/2021   Influenza Split 12/22/2010, 12/21/2011   Influenza Whole 02/15/2010   Influenza, High Dose Seasonal PF 02/01/2015, 01/02/2017, 01/28/2018   Influenza,inj,Quad PF,6+ Mos 12/02/2012, 01/21/2014   Influenza-Unspecified 01/18/2016   Moderna Covid-19 Vaccine Bivalent Booster 58yrs & up 01/18/2021   Moderna Sars-Covid-2 Vaccination 04/02/2019, 05/04/2019, 01/28/2020, 07/14/2020   Pneumococcal Conjugate-13 02/10/2014   Pneumococcal Polysaccharide-23 12/17/2012   Td 02/15/2010  Tdap 03/19/2010, 02/02/2021   Zoster Recombinat (Shingrix) 09/10/2017, 11/15/2017, 11/21/2017   Zoster, Live 03/20/2007    TDAP status: Up to date  Flu Vaccine status: Up to date  Pneumococcal vaccine status: Up to date  Covid-19 vaccine status: Completed vaccines  Qualifies for Shingles Vaccine? Yes   Zostavax completed Yes   Shingrix Completed?: Yes  Screening Tests Health Maintenance  Topic Date Due   COVID-19 Vaccine (7 - 2023-24 season) 03/20/2022   INFLUENZA VACCINE  10/18/2022   Medicare Annual Wellness (AWV)  07/11/2023   DTaP/Tdap/Td (4 - Td or Tdap) 02/03/2031   Pneumonia Vaccine 56+ Years old  Completed   DEXA SCAN  Completed   Zoster Vaccines- Shingrix  Completed   HPV VACCINES  Aged Out    Health Maintenance  Health Maintenance Due  Topic Date Due    COVID-19 Vaccine (7 - 2023-24 season) 03/20/2022    Colorectal cancer screening: No longer required.   Mammogram status: No longer required due to age.    Additional Screening:   Vision Screening: Recommended annual ophthalmology exams for early detection of glaucoma and other disorders of the eye. Is the patient up to date with their annual eye exam?  Yes  Who is the provider or what is the name of the office in which the patient attends annual eye exams? Dr Emily Filbert  If pt is not established with a provider, would they like to be referred to a provider to establish care? No .   Dental Screening: Recommended annual dental exams for proper oral hygiene  Community Resource Referral / Chronic Care Management: CRR required this visit?  No   CCM required this visit?  No      Plan:     I have personally reviewed and noted the following in the patient's chart:   Medical and social history Use of alcohol, tobacco or illicit drugs  Current medications and supplements including opioid prescriptions. Patient is not currently taking opioid prescriptions. Functional ability and status Nutritional status Physical activity Advanced directives List of other physicians Hospitalizations, surgeries, and ER visits in previous 12 months Vitals Screenings to include cognitive, depression, and falls Referrals and appointments  In addition, I have reviewed and discussed with patient certain preventive protocols, quality metrics, and best practice recommendations. A written personalized care plan for preventive services as well as general preventive health recommendations were provided to patient.     Marzella Schlein, LPN   1/61/0960   Nurse Notes: none

## 2022-07-11 NOTE — Patient Instructions (Signed)
Jennifer Peck , Thank you for taking time to come for your Medicare Wellness Visit. I appreciate your ongoing commitment to your health goals. Please review the following plan we discussed and let me know if I can assist you in the future.   These are the goals we discussed:  Goals      Patient Stated     Maintain current health by staying active and eating well.      Patient Stated     Continue 3 times a week exercise      Patient Stated     Decrease salt intake and continue to exercise         This is a list of the screening recommended for you and due dates:  Health Maintenance  Topic Date Due   COVID-19 Vaccine (7 - 2023-24 season) 03/20/2022   Flu Shot  10/18/2022   Medicare Annual Wellness Visit  07/11/2023   DTaP/Tdap/Td vaccine (4 - Td or Tdap) 02/03/2031   Pneumonia Vaccine  Completed   DEXA scan (bone density measurement)  Completed   Zoster (Shingles) Vaccine  Completed   HPV Vaccine  Aged Out    Advanced directives: Please bring a copy of your health care power of attorney and living will to the office at your convenience.  Conditions/risks identified: decrease salt intake and continue exercising   Next appointment: Follow up in one year for your annual wellness visit    Preventive Care 65 Years and Older, Female Preventive care refers to lifestyle choices and visits with your health care provider that can promote health and wellness. What does preventive care include? A yearly physical exam. This is also called an annual well check. Dental exams once or twice a year. Routine eye exams. Ask your health care provider how often you should have your eyes checked. Personal lifestyle choices, including: Daily care of your teeth and gums. Regular physical activity. Eating a healthy diet. Avoiding tobacco and drug use. Limiting alcohol use. Practicing safe sex. Taking low-dose aspirin every day. Taking vitamin and mineral supplements as recommended by your  health care provider. What happens during an annual well check? The services and screenings done by your health care provider during your annual well check will depend on your age, overall health, lifestyle risk factors, and family history of disease. Counseling  Your health care provider may ask you questions about your: Alcohol use. Tobacco use. Drug use. Emotional well-being. Home and relationship well-being. Sexual activity. Eating habits. History of falls. Memory and ability to understand (cognition). Work and work Astronomer. Reproductive health. Screening  You may have the following tests or measurements: Height, weight, and BMI. Blood pressure. Lipid and cholesterol levels. These may be checked every 5 years, or more frequently if you are over 1 years old. Skin check. Lung cancer screening. You may have this screening every year starting at age 5 if you have a 30-pack-year history of smoking and currently smoke or have quit within the past 15 years. Fecal occult blood test (FOBT) of the stool. You may have this test every year starting at age 60. Flexible sigmoidoscopy or colonoscopy. You may have a sigmoidoscopy every 5 years or a colonoscopy every 10 years starting at age 33. Hepatitis C blood test. Hepatitis B blood test. Sexually transmitted disease (STD) testing. Diabetes screening. This is done by checking your blood sugar (glucose) after you have not eaten for a while (fasting). You may have this done every 1-3 years. Bone density scan. This  is done to screen for osteoporosis. You may have this done starting at age 75. Mammogram. This may be done every 1-2 years. Talk to your health care provider about how often you should have regular mammograms. Talk with your health care provider about your test results, treatment options, and if necessary, the need for more tests. Vaccines  Your health care provider may recommend certain vaccines, such as: Influenza vaccine.  This is recommended every year. Tetanus, diphtheria, and acellular pertussis (Tdap, Td) vaccine. You may need a Td booster every 10 years. Zoster vaccine. You may need this after age 60. Pneumococcal 13-valent conjugate (PCV13) vaccine. One dose is recommended after age 63. Pneumococcal polysaccharide (PPSV23) vaccine. One dose is recommended after age 63. Talk to your health care provider about which screenings and vaccines you need and how often you need them. This information is not intended to replace advice given to you by your health care provider. Make sure you discuss any questions you have with your health care provider. Document Released: 04/01/2015 Document Revised: 11/23/2015 Document Reviewed: 01/04/2015 Elsevier Interactive Patient Education  2017 [place] Prevention in the Home Falls can cause injuries. They can happen to people of all ages. There are many things you can do to make your home safe and to help prevent falls. What can I do on the outside of my home? Regularly fix the edges of walkways and driveways and fix any cracks. Remove anything that might make you trip as you walk through a door, such as a raised step or threshold. Trim any bushes or trees on the path to your home. Use bright outdoor lighting. Clear any walking paths of anything that might make someone trip, such as rocks or tools. Regularly check to see if handrails are loose or broken. Make sure that both sides of any steps have handrails. Any raised decks and porches should have guardrails on the edges. Have any leaves, snow, or ice cleared regularly. Use sand or salt on walking paths during winter. Clean up any spills in your garage right away. This includes oil or grease spills. What can I do in the bathroom? Use night lights. Install grab bars by the toilet and in the tub and shower. Do not use towel bars as grab bars. Use non-skid mats or decals in the tub or shower. If you need to sit  down in the shower, use a plastic, non-slip stool. Keep the floor dry. Clean up any water that spills on the floor as soon as it happens. Remove soap buildup in the tub or shower regularly. Attach bath mats securely with double-sided non-slip rug tape. Do not have throw rugs and other things on the floor that can make you trip. What can I do in the bedroom? Use night lights. Make sure that you have a light by your bed that is easy to reach. Do not use any sheets or blankets that are too big for your bed. They should not hang down onto the floor. Have a firm chair that has side arms. You can use this for support while you get dressed. Do not have throw rugs and other things on the floor that can make you trip. What can I do in the kitchen? Clean up any spills right away. Avoid walking on wet floors. Keep items that you use a lot in easy-to-reach places. If you need to reach something above you, use a strong step stool that has a grab bar. Keep electrical cords out  of the way. Do not use floor polish or wax that makes floors slippery. If you must use wax, use non-skid floor wax. Do not have throw rugs and other things on the floor that can make you trip. What can I do with my stairs? Do not leave any items on the stairs. Make sure that there are handrails on both sides of the stairs and use them. Fix handrails that are broken or loose. Make sure that handrails are as long as the stairways. Check any carpeting to make sure that it is firmly attached to the stairs. Fix any carpet that is loose or worn. Avoid having throw rugs at the top or bottom of the stairs. If you do have throw rugs, attach them to the floor with carpet tape. Make sure that you have a light switch at the top of the stairs and the bottom of the stairs. If you do not have them, ask someone to add them for you. What else can I do to help prevent falls? Wear shoes that: Do not have high heels. Have rubber bottoms. Are  comfortable and fit you well. Are closed at the toe. Do not wear sandals. If you use a stepladder: Make sure that it is fully opened. Do not climb a closed stepladder. Make sure that both sides of the stepladder are locked into place. Ask someone to hold it for you, if possible. Clearly mark and make sure that you can see: Any grab bars or handrails. First and last steps. Where the edge of each step is. Use tools that help you move around (mobility aids) if they are needed. These include: Canes. Walkers. Scooters. Crutches. Turn on the lights when you go into a dark area. Replace any light bulbs as soon as they burn out. Set up your furniture so you have a clear path. Avoid moving your furniture around. If any of your floors are uneven, fix them. If there are any pets around you, be aware of where they are. Review your medicines with your doctor. Some medicines can make you feel dizzy. This can increase your chance of falling. Ask your doctor what other things that you can do to help prevent falls. This information is not intended to replace advice given to you by your health care provider. Make sure you discuss any questions you have with your health care provider. Document Released: 12/30/2008 Document Revised: 08/11/2015 Document Reviewed: 04/09/2014 Elsevier Interactive Patient Education  2017 [employer].

## 2022-07-16 ENCOUNTER — Other Ambulatory Visit: Payer: Self-pay | Admitting: Family Medicine

## 2022-09-27 NOTE — Patient Instructions (Addendum)
Check at home to see if you have fluticasone cream or triamcinolone cream (to apply to pinkish discoloration on lower legs when needed)

## 2022-09-28 ENCOUNTER — Ambulatory Visit (INDEPENDENT_AMBULATORY_CARE_PROVIDER_SITE_OTHER): Payer: Medicare Other | Admitting: Family Medicine

## 2022-09-28 ENCOUNTER — Other Ambulatory Visit: Payer: Self-pay | Admitting: Family Medicine

## 2022-09-28 ENCOUNTER — Encounter: Payer: Self-pay | Admitting: Family Medicine

## 2022-09-28 VITALS — BP 122/72 | HR 74 | Wt 123.0 lb

## 2022-09-28 DIAGNOSIS — I10 Essential (primary) hypertension: Secondary | ICD-10-CM | POA: Diagnosis not present

## 2022-09-28 DIAGNOSIS — I2510 Atherosclerotic heart disease of native coronary artery without angina pectoris: Secondary | ICD-10-CM

## 2022-09-28 DIAGNOSIS — I872 Venous insufficiency (chronic) (peripheral): Secondary | ICD-10-CM | POA: Diagnosis not present

## 2022-09-28 DIAGNOSIS — I890 Lymphedema, not elsewhere classified: Secondary | ICD-10-CM | POA: Diagnosis not present

## 2022-09-28 LAB — BASIC METABOLIC PANEL
BUN: 11 mg/dL (ref 6–23)
CO2: 26 mEq/L (ref 19–32)
Calcium: 9.9 mg/dL (ref 8.4–10.5)
Chloride: 92 mEq/L — ABNORMAL LOW (ref 96–112)
Creatinine, Ser: 0.72 mg/dL (ref 0.40–1.20)
GFR: 69.85 mL/min (ref >60.00)
Glucose, Bld: 104 mg/dL — ABNORMAL HIGH (ref 70–99)
Potassium: 4.5 mEq/L (ref 3.5–5.1)
Sodium: 129 mEq/L — ABNORMAL LOW (ref 135–145)

## 2022-09-28 MED ORDER — ENALAPRIL MALEATE 20 MG PO TABS: ORAL_TABLET | ORAL | 1 refills | Status: DC

## 2022-09-28 MED ORDER — METOPROLOL SUCCINATE ER 25 MG PO TB24: 25.0000 mg | ORAL_TABLET | Freq: Every day | ORAL | 1 refills | Status: DC

## 2022-09-28 MED ORDER — FEXOFENADINE HCL 60 MG PO TABS: ORAL_TABLET | ORAL | 1 refills | Status: DC

## 2022-09-28 MED ORDER — AMLODIPINE BESYLATE 5 MG PO TABS: 5.0000 mg | ORAL_TABLET | Freq: Every day | ORAL | 1 refills | Status: DC

## 2022-09-28 NOTE — Progress Notes (Signed)
OFFICE VISIT  09/28/2022  CC:  Chief Complaint  Patient presents with   Medical Management of Chronic Issues    Patient is a 89 y.o. female who presents accompanied by her daughter for 30-month follow-up hypertension and bilateral lower extremity swelling. A/P as of last visit: "#1 lymphedema.  Improved with the daily dose of Lasix 20 mg. Recheck basic metabolic panel today. If electrolytes and creatinine are stable then will continue her on a 20 mg dose daily.   #2 urticaria, chronic. Unknown etiology. Her lower legs do have venous stasis skin changes but currently only minimal. We discussed minimizing use of Cutivate to avoid tachyphylaxis as well as avoid systemic steroid effects. She will continue Allegra 60 mg twice a day as needed."  INTERIM HX: She feels tired a lot but this is not particularly new for her.  She checked her blood pressure once when she was more tired and it was 118/60.  This prompted her to leave off her afternoon half tab of enalapril.  She reports her blood pressure got up a little higher but she did not feel any different.  She feels like her lower extremity swelling is significantly improving consistently.  She has markedly cut back on sodium intake. Labs last visit were normal so we kept her on Lasix 20 mg daily.  Allegra is helping her itching well.  ROS as above, plus--> no fevers, no CP, no SOB, no wheezing, no cough, no dizziness, no HAs, no rashes, no melena/hematochezia.  No polyuria or polydipsia.  No myalgias or arthralgias.  No focal weakness, paresthesias, or tremors.  No acute vision or hearing abnormalities.  No dysuria or unusual/new urinary urgency or frequency.  No n/v/d or abd pain.  No palpitations.    Past Medical History:  Diagnosis Date   Allergic state 04/30/2011   Anxiety    BCC (basal cell carcinoma), face 11/06/2010   Bilateral bunions 11/06/2010   Chronic venous insufficiency    lymphedema + venous stasis pigmentation  changes   Constipation 11/06/2010   Coronary artery disease 2011   BMS x 3   Diastolic heart failure (HCC)    GERD (gastroesophageal reflux disease) 05/11/2012   History of hyperkalemia 01/23/2011   Hyperlipidemia    Intolerant of statins and zetia.  Refused to consider PCSK9 inhibitor initially but as of 02/2017 cardiol f/u she would consider repatha if approved.   Hypertension    +white coat component   Lesion of labia 02/04/2012   Melanoma in situ [facility]) 08/2016   Derm= Dr. Sharyn Lull @ [facility] in [place].   Mitral regurgitation 12/2020   moderate by echo 12/2020   Osteoarthritis of both knees    R>L   Osteopenia 11/23/2010; 02/2016   2017 T-score -1.9.  Repeat DEXA 2 yrs.   Peripheral neuropathy 11/06/2010   Small fiber, non-diabetic per neurologist   Right lumbar radiculopathy 01/2015   Improved with PT   SCC (squamous cell carcinoma), arm 11/06/2010   arm 2012.  R lower leg 10/2020   Seborrheic keratosis 04/30/2011   Vertigo     Past Surgical History:  Procedure Laterality Date   CARDIAC CATHETERIZATION  12/20/2009   3 bare metal stents   DEXA  03/05/2016   T-score -1.9   EYE SURGERY     cataract- left eye 09-24-11   LE arterial dopplers     03/2022 NORMAL   SKIN BIOPSY  08/2012   Shave excision of lesion on pt's back: irritated seb keratosis.  TRANSTHORACIC ECHOCARDIOGRAM  12/05/2009   Mild LVH, normal systolic fxn, mild impaired relaxation.  No signif valvular dz. 12/01/20->EF 60-65%, grd II DD, mod MR, +mild pulm art htn/   TUBAL LIGATION      Outpatient Medications Prior to Visit  Medication Sig Dispense Refill   azelastine (OPTIVAR) 0.05 % ophthalmic solution INSTILL TWO DROPS INTO EACH EYE TWICE DAILY 6 mL 12   B Complex Vitamins (B COMPLEX-B12 PO) Take by mouth daily.     furosemide (LASIX) 20 MG tablet 1 tab every morning 30 tablet 5   magnesium oxide (MAG-OX) 400 MG tablet Take 400 mg by mouth once a week.     Multiple Vitamin (MULTIVITAMIN)  tablet Take 1 tablet by mouth once a week. Takes occ     Omega-3 Fatty Acids (FISH OIL PO) Take 1 capsule by mouth once a week.     triamcinolone cream (KENALOG) 0.1 % Apply 1 Application topically 2 (two) times daily.     fluocinonide (LIDEX) 0.05 % external solution Apply 1 application topically daily as needed. (Patient not taking: Reported on 07/11/2022)     fluorouracil (EFUDEX) 5 % cream Apply to affected area once daily (Patient not taking: Reported on 07/11/2022) 40 g 0   Fluorouracil 4 % CREA Apply to affected areas once daily (Patient not taking: Reported on 07/11/2022) 40 g 1   fluticasone (CUTIVATE) 0.05 % cream Apply to affected areas of lower legs twice a day (Patient not taking: Reported on 07/11/2022) 60 g 1   Naftifine HCl (NAFTIN) 2 % GEL Apply to affected area qd-bid (Patient not taking: Reported on 07/11/2022) 60 g 3   amLODipine (NORVASC) 5 MG tablet Take 1 tablet (5 mg total) by mouth daily. 90 tablet 1   enalapril (VASOTEC) 20 MG tablet 1 tab po qAM and 1/2 tab po qhs 135 tablet 1   fexofenadine (ALLEGRA) 60 MG tablet TAKE 1 TABLET BY MOUTH TWICE A DAY FOR ITCHING 180 tablet 1   metoprolol succinate (TOPROL-XL) 25 MG 24 hr tablet TAKE 1 TABLET (25 MG TOTAL) BY MOUTH DAILY. 90 tablet 0   No facility-administered medications prior to visit.    Allergies  Allergen Reactions   Chlorthalidone     hyponatremia   Atenolol     Caused fatigue    Besivance [Besifloxacin Hcl]     rash   Codeine Nausea And Vomiting   Crestor [Rosuvastatin]     "leg gave away"   Gabapentin     Memory loss and confusion   Lipitor [Atorvastatin] Other (See Comments)    Severe leg pain   Lovastatin     myalgia   Simvastatin     Her leg gave away on her and states thinks secondary to Simvastatin   Statins Support [Acid Blockers Support]     Other reaction(s): Unknown   Sulfa Antibiotics Nausea Only   Vigamox [Moxifloxacin]     rash   Welchol [Colesevelam Hcl]     Review of Systems As per  HPI  PE:    09/28/2022    9:14 AM 09/28/2022    9:06 AM 07/11/2022    9:49 AM  Vitals with BMI  Weight  123 lbs 131 lbs  BMI   25.58  Systolic 122 153   Diastolic 72 76   Pulse  74    Initial bp today 153/76 Rpt manually 122/72 P74  Physical Exam  Gen: Alert, well appearing.  Patient is oriented to person, place, time, and situation.  AFFECT: pleasant, lucid thought and speech. LEGS: 1+ bilat LE pitting edema, symmetric. Pinkish discoloration anterior tibial surface on L.  +hyperpigmentation and mild fibrotic changes in both legs, L>R  LABS:  Last CBC Lab Results  Component Value Date   WBC 5.7 05/04/2021   HGB 14.2 05/04/2021   HCT 43.3 05/04/2021   MCV 94.3 05/04/2021   MCH 31.8 08/17/2020   RDW 13.5 05/04/2021   PLT 177.0 05/04/2021   Last metabolic panel Lab Results  Component Value Date   GLUCOSE 123 (H) 06/29/2022   NA 136 06/29/2022   K 4.4 06/29/2022   CL 99 06/29/2022   CO2 23 06/29/2022   BUN 30 (H) 06/29/2022   CREATININE 0.82 06/29/2022   GFR 65.60 06/13/2022   CALCIUM 9.6 06/29/2022   PHOS 3.5 05/07/2012   PROT 7.3 05/04/2021   ALBUMIN 4.7 05/04/2021   LABGLOB 2.8 09/29/2019   AGRATIO 1.6 09/29/2019   BILITOT 0.6 05/04/2021   ALKPHOS 62 05/04/2021   AST 11 05/04/2021   ALT 13 05/04/2021   ANIONGAP 11 05/02/2015   Last lipids Lab Results  Component Value Date   CHOL 234 (H) 09/29/2019   HDL 72 09/29/2019   LDLCALC 146 (H) 09/29/2019   LDLDIRECT 128.0 12/02/2012   TRIG 95 09/29/2019   CHOLHDL 3.3 09/29/2019    IMPRESSION AND PLAN:  #1 hypertension, well-controlled on enalapril 20 mg every morning.  We will continue to leave off her afternoon half dose of enalapril.  Continue amlodipine 5 mg a day and Toprol-XL 25 mg a day.  #2 lymphedema. She is doing well with low-sodium diet and a daily dose of Lasix 20 mg. Monitor electrolytes and creatinine today.  #3 CAD, asymptomatic. Turns out she has not been taking her  aspirin. Encouraged her to restart 81 mg aspirin daily.  Continue beta-blocker. History of statin intolerance and does not want trial of any further cholesterol-lowering medication.  An After Visit Summary was printed and given to the patient.  FOLLOW UP: Return in about 3 months (around 12/29/2022) for routine chronic illness f/u.  Signed:  Santiago Bumpers, MD           09/28/2022

## 2022-10-11 ENCOUNTER — Telehealth: Payer: Self-pay

## 2022-10-11 NOTE — Telephone Encounter (Signed)
Pt called stating that she has been constipated and fills like it is because of the lasix that may be causing the constipation. Please advise

## 2022-10-12 NOTE — Telephone Encounter (Signed)
Take otc senakot-S (generic is fine).  Two tabs every day.

## 2022-10-12 NOTE — Telephone Encounter (Signed)
Pt advised of recommendations.  

## 2022-10-24 ENCOUNTER — Ambulatory Visit: Payer: Medicare Other | Admitting: Podiatry

## 2022-10-24 DIAGNOSIS — M79675 Pain in left toe(s): Secondary | ICD-10-CM | POA: Diagnosis not present

## 2022-10-24 DIAGNOSIS — Q828 Other specified congenital malformations of skin: Secondary | ICD-10-CM | POA: Diagnosis not present

## 2022-10-24 DIAGNOSIS — B351 Tinea unguium: Secondary | ICD-10-CM

## 2022-10-24 DIAGNOSIS — M79674 Pain in right toe(s): Secondary | ICD-10-CM

## 2022-10-24 DIAGNOSIS — L84 Corns and callosities: Secondary | ICD-10-CM

## 2022-10-24 DIAGNOSIS — G629 Polyneuropathy, unspecified: Secondary | ICD-10-CM | POA: Diagnosis not present

## 2022-11-03 ENCOUNTER — Encounter: Payer: Self-pay | Admitting: Podiatry

## 2022-11-03 NOTE — Progress Notes (Signed)
Subjective:  Patient ID: Jennifer Peck, female    DOB: [birth year],  MRN: [id number]  Jennifer Peck presents to clinic today for at risk foot care with history of peripheral neuropathy and corn(s)  bilateral 3rd toes, porokeratotic lesion(s) submet head 5 right foot and painful mycotic nails. Painful toenails interfere with ambulation. Aggravating factors include wearing enclosed shoe gear. Pain is relieved with periodic professional debridement. Painful corns and porokeratotic lesion(s) aggravated when weightbearing with and without shoegear. Pain is relieved with periodic professional debridement.  Chief Complaint  Patient presents with   RFC    RFC AND CORNS   New problem(s): None.   PCP is McGowen, Maryjean Morn, MD.  Allergies  Allergen Reactions   Chlorthalidone     hyponatremia   Atenolol     Caused fatigue    Besivance [Besifloxacin Hcl]     rash   Codeine Nausea And Vomiting   Crestor [Rosuvastatin]     "leg gave away"   Gabapentin     Memory loss and confusion   Lipitor [Atorvastatin] Other (See Comments)    Severe leg pain   Lovastatin     myalgia   Simvastatin     Her leg gave away on her and states thinks secondary to Simvastatin   Statins Support [Acid Blockers Support]     Other reaction(s): Unknown   Sulfa Antibiotics Nausea Only   Vigamox [Moxifloxacin]     rash   Welchol [Colesevelam Hcl]     Review of Systems: Negative except as noted in the HPI.  Objective: No changes noted in today's physical examination. There were no vitals filed for this visit. KESI BRANK is a pleasant 89 y.o. female in NAD. AAO x 3.  Vascular CFT <3 seconds b/l LE. Faintly palpable DP pulses b/l LE. Faintly palpable PT pulse(s) b/l LE. Pedal hair absent. No pain with calf compression b/l. Lower extremity skin temperature gradient warm to cool.  Evidence of chronic venous insufficiency b/l LE with chronic lower extremity edema. No cyanosis or clubbing noted b/l LE.  Neurologic  Normal speech. Oriented to person, place, and time. Protective sensation diminished with 10g monofilament b/l.  Dermatologic Pedal integument with normal turgor, texture and tone BLE. Toenails 1-5 b/l elongated, discolored, dystrophic, thickened, crumbly with subungual debris and tenderness to dorsal palpation.   Porokeratotic  lesion(s) submet head 5 right foot.  No erythema, no edema, no drainage, no fluctuance.   Hyperkeratotic lesion(s) distal tip of right 3rd toe and medial aspect left 3rd toe. No erythema, no edema, no drainage, no fluctuance.  Orthopedic: Noted disuse atrophy bilaterally. No pain, crepitus or joint limitation noted with ROM bilateral LE. Hammertoe(s) noted to the L 2nd toe, L 3rd toe, L 4th toe, R 2nd toe, R 3rd toe, and R 4th toe.   Radiographs: None  Assessment/Plan: 1. Pain due to onychomycosis of toenails of both feet   2. Porokeratosis   3. Corns   4. Neuropathy     -Consent given for treatment as described below: -Examined patient. -Mycotic toenails 1-5 bilaterally were debrided in length and girth with sterile nail nippers and dremel without incident. -Corn(s) bilateral 3rd toes pared utilizing sterile scalpel blade without complication or incident. Total number debrided=2. -Porokeratotic lesion(s) submet head 5 right foot pared and enucleated with sterile currette without incident. Total number of lesions debrided=1. -Patient/POA to call should there be question/concern in the interim.   Return in about 3 months (around 01/24/2023).  Freddie Breech,  DPM

## 2022-12-25 ENCOUNTER — Other Ambulatory Visit: Payer: Self-pay | Admitting: Family Medicine

## 2023-01-01 ENCOUNTER — Encounter: Payer: Self-pay | Admitting: Family Medicine

## 2023-01-01 ENCOUNTER — Ambulatory Visit: Payer: Medicare Other | Admitting: Family Medicine

## 2023-01-01 VITALS — BP 139/73 | HR 62 | Wt 116.4 lb

## 2023-01-01 DIAGNOSIS — R634 Abnormal weight loss: Secondary | ICD-10-CM | POA: Diagnosis not present

## 2023-01-01 DIAGNOSIS — I2510 Atherosclerotic heart disease of native coronary artery without angina pectoris: Secondary | ICD-10-CM | POA: Diagnosis not present

## 2023-01-01 DIAGNOSIS — Z23 Encounter for immunization: Secondary | ICD-10-CM | POA: Diagnosis not present

## 2023-01-01 DIAGNOSIS — I890 Lymphedema, not elsewhere classified: Secondary | ICD-10-CM | POA: Diagnosis not present

## 2023-01-01 DIAGNOSIS — I10 Essential (primary) hypertension: Secondary | ICD-10-CM

## 2023-01-01 DIAGNOSIS — Z789 Other specified health status: Secondary | ICD-10-CM

## 2023-01-01 DIAGNOSIS — Z5320 Procedure and treatment not carried out because of patient's decision for unspecified reasons: Secondary | ICD-10-CM

## 2023-01-01 LAB — COMPREHENSIVE METABOLIC PANEL
ALT: 15 U/L (ref 0–35)
AST: 13 U/L (ref 0–37)
Albumin: 4.7 g/dL (ref 3.5–5.2)
Alkaline Phosphatase: 66 U/L (ref 39–117)
BUN: 13 mg/dL (ref 6–23)
CO2: 28 meq/L (ref 19–32)
Calcium: 10.1 mg/dL (ref 8.4–10.5)
Chloride: 94 meq/L — ABNORMAL LOW (ref 96–112)
Creatinine, Ser: 0.8 mg/dL (ref 0.40–1.20)
GFR: 61.44 mL/min (ref >60.00)
Glucose, Bld: 105 mg/dL — ABNORMAL HIGH (ref 70–99)
Potassium: 4.4 meq/L (ref 3.5–5.1)
Sodium: 134 meq/L — ABNORMAL LOW (ref 135–145)
Total Bilirubin: 0.8 mg/dL (ref 0.2–1.2)
Total Protein: 7.6 g/dL (ref 6.0–8.3)

## 2023-01-01 MED ORDER — FUROSEMIDE 20 MG PO TABS: ORAL_TABLET | ORAL | 1 refills | Status: DC

## 2023-01-01 NOTE — Progress Notes (Signed)
OFFICE VISIT  01/01/2023  CC:  Chief Complaint  Patient presents with   Medical Management of Chronic Issues    Pt mention discussing Lasix; She has some confusion on if she should be taking that and the stool softener (Senekot)    Patient is a 89 y.o. female who presents accompanied by her daughter for 29-month follow-up hypertension, lymphedema, and coronary artery disease. A/P as of last visit: " hypertension, well-controlled on enalapril 20 mg every morning.  We will continue to leave off her afternoon half dose of enalapril.  Continue amlodipine 5 mg a day and Toprol-XL 25 mg a day.   #2 lymphedema. She is doing well with low-sodium diet and a daily dose of Lasix 20 mg. Monitor electrolytes and creatinine today.   #3 CAD, asymptomatic. Turns out she has not been taking her aspirin. Encouraged her to restart 81 mg aspirin daily.  Continue beta-blocker. History of statin intolerance and does not want trial of any further cholesterol-lowering medication"  INTERIM HX: Swelling in legs has not been a significant issue lately. She has markedly decreased her sodium intake.  As a result of her attempts to minimize sodium intake she has also significantly decreased her calorie intake. She does not have much of an appetite.  She has no abdominal pain, no nausea, no diarrhea. She drinks a lot of water. She takes Lasix 20 mg a day.  ROS as above, plus--> no fevers, no CP, no SOB, no wheezing, no cough, no dizziness, no HAs, no rashes, no melena/hematochezia.  No polyuria or polydipsia.  No myalgias or arthralgias.  No dysuria or unusual/new urinary urgency or frequency.   No palpitations.    Past Medical History:  Diagnosis Date   Allergic state 04/30/2011   Anxiety    BCC (basal cell carcinoma), face 11/06/2010   Bilateral bunions 11/06/2010   Chronic venous insufficiency    lymphedema + venous stasis pigmentation changes   Constipation 11/06/2010   Coronary artery disease 2011    BMS x 3   Diastolic heart failure (HCC)    GERD (gastroesophageal reflux disease) 05/11/2012   History of hyperkalemia 01/23/2011   Hyperlipidemia    Intolerant of statins and zetia.  Refused to consider PCSK9 inhibitor initially but as of 02/2017 cardiol f/u she would consider repatha if approved.   Hypertension    +white coat component   Lesion of labia 02/04/2012   Melanoma in situ [facility]) 08/2016   Derm= Dr. Sharyn Lull @ [facility] in [place].   Mitral regurgitation 12/2020   moderate by echo 12/2020   Osteoarthritis of both knees    R>L   Osteopenia 11/23/2010; 02/2016   2017 T-score -1.9.  Repeat DEXA 2 yrs.   Peripheral neuropathy 11/06/2010   Small fiber, non-diabetic per neurologist   Right lumbar radiculopathy 01/2015   Improved with PT   SCC (squamous cell carcinoma), arm 11/06/2010   arm 2012.  R lower leg 10/2020   Seborrheic keratosis 04/30/2011   Vertigo     Past Surgical History:  Procedure Laterality Date   CARDIAC CATHETERIZATION  12/20/2009   3 bare metal stents   DEXA  03/05/2016   T-score -1.9   EYE SURGERY     cataract- left eye 09-24-11   LE arterial dopplers     03/2022 NORMAL   SKIN BIOPSY  08/2012   Shave excision of lesion on pt's back: irritated seb keratosis.   TRANSTHORACIC ECHOCARDIOGRAM  12/05/2009   Mild LVH, normal systolic fxn, mild  impaired relaxation.  No signif valvular dz. 12/01/20->EF 60-65%, grd II DD, mod MR, +mild pulm art htn/   TUBAL LIGATION      Outpatient Medications Prior to Visit  Medication Sig Dispense Refill   amLODipine (NORVASC) 5 MG tablet Take 1 tablet (5 mg total) by mouth daily. 90 tablet 1   aspirin EC 81 MG tablet Take 81 mg by mouth daily. Swallow whole.     azelastine (OPTIVAR) 0.05 % ophthalmic solution INSTILL TWO DROPS INTO EACH EYE TWICE DAILY 6 mL 12   B Complex Vitamins (B COMPLEX-B12 PO) Take by mouth daily.     enalapril (VASOTEC) 20 MG tablet 1 tab po qAM 90 tablet 1   fexofenadine (ALLEGRA) 60  MG tablet TAKE 1 TABLET BY MOUTH TWICE A DAY FOR ITCHING 180 tablet 1   fluorouracil (EFUDEX) 5 % cream APPLY TO AFFECTED AREA EVERY DAY 40 g 0   magnesium oxide (MAG-OX) 400 MG tablet Take 400 mg by mouth once a week.     metoprolol succinate (TOPROL-XL) 25 MG 24 hr tablet Take 1 tablet (25 mg total) by mouth daily. 90 tablet 1   Multiple Vitamin (MULTIVITAMIN) tablet Take 1 tablet by mouth once a week. Takes occ     Naftifine HCl (NAFTIN) 2 % GEL Apply to affected area qd-bid 60 g 3   Omega-3 Fatty Acids (FISH OIL PO) Take 1 capsule by mouth once a week.     triamcinolone cream (KENALOG) 0.1 % Apply 1 Application topically 2 (two) times daily.     furosemide (LASIX) 20 MG tablet 1 tab every morning 30 tablet 5   fluocinonide (LIDEX) 0.05 % external solution Apply 1 application topically daily as needed. (Patient not taking: Reported on 07/11/2022)     Fluorouracil 4 % CREA Apply to affected areas once daily (Patient not taking: Reported on 07/11/2022) 40 g 1   fluticasone (CUTIVATE) 0.05 % cream Apply to affected areas of lower legs twice a day (Patient not taking: Reported on 07/11/2022) 60 g 1   No facility-administered medications prior to visit.    Allergies  Allergen Reactions   Chlorthalidone     hyponatremia   Atenolol     Caused fatigue    Besivance [Besifloxacin Hcl]     rash   Codeine Nausea And Vomiting   Crestor [Rosuvastatin]     "leg gave away"   Gabapentin     Memory loss and confusion   Lipitor [Atorvastatin] Other (See Comments)    Severe leg pain   Lovastatin     myalgia   Simvastatin     Her leg gave away on her and states thinks secondary to Simvastatin   Statins Support [Acid Blockers Support]     Other reaction(s): Unknown   Sulfa Antibiotics Nausea Only   Vigamox [Moxifloxacin]     rash   Welchol [Colesevelam Hcl]     Review of Systems As per HPI  PE:    01/01/2023    9:54 AM 09/28/2022    9:14 AM 09/28/2022    9:06 AM  Vitals with BMI  Weight  116 lbs 6 oz  123 lbs  Systolic 139 122 161  Diastolic 73 72 76  Pulse 62  74   Physical Exam  Gen: Alert, well appearing.  Patient is oriented to person, place, time, and situation. AFFECT: pleasant, lucid thought and speech. CV: RRR, no m/r/g.   LUNGS: CTA bilat, nonlabored resps, good aeration in all lung fields. EXT:  Some scattered ecchymoses and deep pink discoloration and scattered hyperpigmentation.  All consistent with known diagnosis of venous insufficiency and lymphedema. Only trace pitting edema.  Left leg appears a bit larger than right--chronic.   LABS:  Last CBC Lab Results  Component Value Date   WBC 5.7 05/04/2021   HGB 14.2 05/04/2021   HCT 43.3 05/04/2021   MCV 94.3 05/04/2021   MCH 31.8 08/17/2020   RDW 13.5 05/04/2021   PLT 177.0 05/04/2021   Last metabolic panel Lab Results  Component Value Date   GLUCOSE 104 (H) 09/28/2022   NA 129 (L) 09/28/2022   K 4.5 09/28/2022   CL 92 (L) 09/28/2022   CO2 26 09/28/2022   BUN 11 09/28/2022   CREATININE 0.72 09/28/2022   GFR 69.85 09/28/2022   CALCIUM 9.9 09/28/2022   PHOS 3.5 05/07/2012   PROT 7.3 05/04/2021   ALBUMIN 4.7 05/04/2021   LABGLOB 2.8 09/29/2019   AGRATIO 1.6 09/29/2019   BILITOT 0.6 05/04/2021   ALKPHOS 62 05/04/2021   AST 11 05/04/2021   ALT 13 05/04/2021   ANIONGAP 11 05/02/2015   Last lipids Lab Results  Component Value Date   CHOL 234 (H) 09/29/2019   HDL 72 09/29/2019   LDLCALC 146 (H) 09/29/2019   LDLDIRECT 128.0 12/02/2012   TRIG 95 09/29/2019   CHOLHDL 3.3 09/29/2019   Last hemoglobin A1c Lab Results  Component Value Date   HGBA1C  09/03/2009    5.4 (NOTE)                                                                       According to the ADA Clinical Practice Recommendations for 2011, when HbA1c is used as a screening test:   >=6.5%   Diagnostic of Diabetes Mellitus           (if abnormal result  is confirmed)  5.7-6.4%   Increased risk of developing Diabetes Mellitus   References:Diagnosis and Classification of Diabetes Mellitus,Diabetes Care,2011,34(Suppl 1):S62-S69 and Standards of Medical Care in         Diabetes - 2011,Diabetes Care,2011,34  (Suppl 1):S11-S61.   Last thyroid functions Lab Results  Component Value Date   TSH 1.57 08/17/2020   Last vitamin B12 and Folate Lab Results  Component Value Date   VITAMINB12 314 08/17/2020   FOLATE >24.8 11/10/2010   IMPRESSION AND PLAN:  #1 hypertension, well-controlled on enalapril 20 mg every morning, amlodipine 5 mg a day and Toprol-XL 25 mg a day. Electrolytes and creatinine monitoring today.  #2 lymphedema. She is very good at limiting sodium now.  However, this is resulting in decreased calorie intake as well.  She has lost 15 pounds over the last 6 months. Encouraged her to go ahead and liberalize sodium intake a little bit and we will except a bit more lower extremity edema if it means getting her better calorie intake/nutrition. Also, decrease Lasix to 20 mg every other day. Monitor electrolytes and creatinine today.   #3 CAD, asymptomatic. She takes aspirin 81 mg a day. Continue Toprol-XL 25 mg a day. History of statin intolerance and does not want trial of any further cholesterol-lowering medication.  An After Visit Summary was printed and given to the patient.  FOLLOW UP:  Return in about 3 months (around 04/03/2023) for routine chronic illness f/u.  Signed:  Santiago Bumpers, MD           01/01/2023

## 2023-01-28 ENCOUNTER — Ambulatory Visit (INDEPENDENT_AMBULATORY_CARE_PROVIDER_SITE_OTHER): Payer: Medicare Other | Admitting: Podiatry

## 2023-01-28 DIAGNOSIS — B351 Tinea unguium: Secondary | ICD-10-CM | POA: Diagnosis not present

## 2023-01-28 DIAGNOSIS — I739 Peripheral vascular disease, unspecified: Secondary | ICD-10-CM | POA: Diagnosis not present

## 2023-01-28 DIAGNOSIS — L84 Corns and callosities: Secondary | ICD-10-CM

## 2023-01-28 DIAGNOSIS — M79674 Pain in right toe(s): Secondary | ICD-10-CM

## 2023-01-28 DIAGNOSIS — M79675 Pain in left toe(s): Secondary | ICD-10-CM

## 2023-01-28 NOTE — Progress Notes (Unsigned)
Subjective:  Patient ID: Jennifer Peck, female    DOB: 1923-02-07,  MRN: [id number]  Jennifer Peck presents to clinic today for:  Chief Complaint  Patient presents with   [facility]    RFC/ CORNS   Patient notes nails are thick and elongated, causing pain in shoe gear when ambulating.  Patient has multiple painful corns on both feet.  PCP is McGowen, Maryjean Morn, MD.  Past Medical History:  Diagnosis Date   Allergic state 04/30/2011   Anxiety    BCC (basal cell carcinoma), face 11/06/2010   Bilateral bunions 11/06/2010   Chronic venous insufficiency    lymphedema + venous stasis pigmentation changes   Constipation 11/06/2010   Coronary artery disease 2011   BMS x 3   Diastolic heart failure (HCC)    GERD (gastroesophageal reflux disease) 05/11/2012   History of hyperkalemia 01/23/2011   Hyperlipidemia    Intolerant of statins and zetia.  Refused to consider PCSK9 inhibitor initially but as of 02/2017 cardiol f/u she would consider repatha if approved.   Hypertension    +white coat component   Lesion of labia 02/04/2012   Melanoma in situ [facility]) 08/2016   Derm= Dr. Sharyn Lull @ [facility] in [place].   Mitral regurgitation 12/2020   moderate by echo 12/2020   Osteoarthritis of both knees    R>L   Osteopenia 11/23/2010; 02/2016   2017 T-score -1.9.  Repeat DEXA 2 yrs.   Peripheral neuropathy 11/06/2010   Small fiber, non-diabetic per neurologist   Right lumbar radiculopathy 01/2015   Improved with PT   SCC (squamous cell carcinoma), arm 11/06/2010   arm 2012.  R lower leg 10/2020   Seborrheic keratosis 04/30/2011   Vertigo     Allergies  Allergen Reactions   Chlorthalidone     hyponatremia   Atenolol     Caused fatigue    Besivance [Besifloxacin Hcl]     rash   Codeine Nausea And Vomiting   Crestor [Rosuvastatin]     "leg gave away"   Gabapentin     Memory loss and confusion   Lipitor [Atorvastatin] Other (See Comments)    Severe leg pain   Lovastatin      myalgia   Simvastatin     Her leg gave away on her and states thinks secondary to Simvastatin   Statins Support [Acid Blockers Support]     Other reaction(s): Unknown   Sulfa Antibiotics Nausea Only   Vigamox [Moxifloxacin]     rash   Welchol [Colesevelam Hcl]     Objective:  GEANINE KURTZER is a pleasant 89 y.o. female in NAD. AAO x 3.  Vascular Examination: Patient has palpable DP pulse, absent PT pulse bilateral.  Delayed capillary refill bilateral toes.  Sparse digital hair bilateral.  Proximal to distal cooling WNL bilateral.    Dermatological Examination: Interspaces are clear with no open lesions noted bilateral.  Skin is shiny and atrophic bilateral.  Nails are 3-74mm thick, with yellowish/brown discoloration, subungual debris and distal onycholysis x10.  There is pain with compression of nails x10.  There are hyperkeratotic lesions noted right submet 5, left submet 1, right distal third toe.  Patient qualifies for at-risk foot care because of PVD.  Assessment/Plan: 1. Pain due to onychomycosis of toenails of both feet   2. Corns   3. PVD (peripheral vascular disease) (HCC)     Mycotic nails x10 were sharply debrided with sterile nail nippers and power debriding burr to  decrease bulk and length.  Hyperkeratotic lesions x3 were shaved with #312 blade.  Return if symptoms worsen or fail to improve.   Clerance Lav, DPM, FACFAS [facility]     [address] [place], [place] [address]                Office [phone]  Fax [phone]

## 2023-03-09 ENCOUNTER — Other Ambulatory Visit: Payer: Self-pay | Admitting: Family Medicine

## 2023-03-25 ENCOUNTER — Other Ambulatory Visit: Payer: Self-pay | Admitting: Family Medicine

## 2023-04-03 ENCOUNTER — Encounter: Payer: Self-pay | Admitting: Family Medicine

## 2023-04-03 ENCOUNTER — Ambulatory Visit: Payer: Medicare PPO | Admitting: Family Medicine

## 2023-04-03 VITALS — BP 150/86 | HR 70 | Wt 117.8 lb

## 2023-04-03 DIAGNOSIS — I10 Essential (primary) hypertension: Secondary | ICD-10-CM

## 2023-04-03 DIAGNOSIS — I890 Lymphedema, not elsewhere classified: Secondary | ICD-10-CM | POA: Diagnosis not present

## 2023-04-03 DIAGNOSIS — I2510 Atherosclerotic heart disease of native coronary artery without angina pectoris: Secondary | ICD-10-CM

## 2023-04-03 LAB — BASIC METABOLIC PANEL
BUN: 15 mg/dL (ref 6–23)
CO2: 31 meq/L (ref 19–32)
Calcium: 9.7 mg/dL (ref 8.4–10.5)
Chloride: 95 meq/L — ABNORMAL LOW (ref 96–112)
Creatinine, Ser: 0.74 mg/dL (ref 0.40–1.20)
GFR: 67.35 mL/min (ref >60.00)
Glucose, Bld: 94 mg/dL (ref 70–99)
Potassium: 4.6 meq/L (ref 3.5–5.1)
Sodium: 134 meq/L — ABNORMAL LOW (ref 135–145)

## 2023-04-03 MED ORDER — METOPROLOL SUCCINATE ER 25 MG PO TB24: 25.0000 mg | ORAL_TABLET | Freq: Every day | ORAL | 3 refills | Status: DC

## 2023-04-03 NOTE — Progress Notes (Signed)
 OFFICE VISIT  04/03/2023  CC:  Chief Complaint  Patient presents with   Medical Management of Chronic Issues    Patient is a 89 y.o. female who presents accompanied by her daughter for 6-month follow-up hypertension, lymphedema, and coronary artery disease. A/P as of last visit: "#1 hypertension, well-controlled on enalapril  20 mg every morning, amlodipine  5 mg a day and Toprol -XL 25 mg a day. Electrolytes and creatinine monitoring today.   #2 lymphedema. She is very good at limiting sodium now.  However, this is resulting in decreased calorie intake as well.  She has lost 15 pounds over the last 6 months. Encouraged her to go ahead and liberalize sodium intake a little bit and we will except a bit more lower extremity edema if it means getting her better calorie intake/nutrition. Also, decrease Lasix  to 20 mg every other day. Monitor electrolytes and creatinine today.   #3 CAD, asymptomatic. She takes aspirin 81 mg a day. Continue Toprol -XL 25 mg a day. History of statin intolerance and does not want trial of any further cholesterol-lowering medication."  INTERIM HX: Complete metabolic panel normal last visit.  Kanae feels well. She is compliant with her medications. Home blood pressure measurements consistently in the 120s over 60s.  Her swelling in her legs is minimal.  She takes a Lasix  typically once every 3 to 5 days.  ROS as above, plus--> no fevers, no CP, no SOB, no wheezing, no cough, no dizziness, no HAs, no rashes, no melena/hematochezia.  No polyuria or polydipsia.  No myalgias or arthralgias.  No focal weakness, paresthesias, or tremors.  No acute vision or hearing abnormalities.  No dysuria or unusual/new urinary urgency or frequency.  No recent changes in lower legs. No n/v/d or abd pain.  No palpitations.     Past Medical History:  Diagnosis Date   Allergic state 04/30/2011   Anxiety    BCC (basal cell carcinoma), face 11/06/2010   Bilateral bunions  11/06/2010   Chronic venous insufficiency    lymphedema + venous stasis pigmentation changes   Constipation 11/06/2010   Coronary artery disease 2011   BMS x 3   Diastolic heart failure (HCC)    GERD (gastroesophageal reflux disease) 05/11/2012   History of hyperkalemia 01/23/2011   Hyperlipidemia    Intolerant of statins and zetia .  Refused to consider PCSK9 inhibitor initially but as of 02/2017 cardiol f/u she would consider repatha if approved.   Hypertension    +white coat component   Lesion of labia 02/04/2012   Melanoma in situ [facility]) 08/2016   Derm= Dr. Dorisann Garre @ [facility] in [place].   Mitral regurgitation 12/2020   moderate by echo 12/2020   Osteoarthritis of both knees    R>L   Osteopenia 11/23/2010; 02/2016   2017 T-score -1.9.  Repeat DEXA 2 yrs.   Peripheral neuropathy 11/06/2010   Small fiber, non-diabetic per neurologist   Right lumbar radiculopathy 01/2015   Improved with PT   SCC (squamous cell carcinoma), arm 11/06/2010   arm 2012.  R lower leg 10/2020   Seborrheic keratosis 04/30/2011   Vertigo     Past Surgical History:  Procedure Laterality Date   CARDIAC CATHETERIZATION  12/20/2009   3 bare metal stents   DEXA  03/05/2016   T-score -1.9   EYE SURGERY     cataract- left eye 09-24-11   LE arterial dopplers     03/2022 NORMAL   SKIN BIOPSY  08/2012   Shave excision of lesion on  pt's back: irritated seb keratosis.   TRANSTHORACIC ECHOCARDIOGRAM  12/05/2009   Mild LVH, normal systolic fxn, mild impaired relaxation.  No signif valvular dz. 12/01/20->EF 60-65%, grd II DD, mod MR, +mild pulm art htn/   TUBAL LIGATION      Outpatient Medications Prior to Visit  Medication Sig Dispense Refill   amLODipine  (NORVASC ) 5 MG tablet TAKE 1 TABLET (5 MG TOTAL) BY MOUTH DAILY. 90 tablet 0   aspirin EC 81 MG tablet Take 81 mg by mouth daily. Swallow whole.     azelastine  (OPTIVAR ) 0.05 % ophthalmic solution INSTILL TWO DROPS INTO EACH EYE TWICE DAILY 6 mL  12   B Complex Vitamins (B COMPLEX-B12 PO) Take by mouth daily.     enalapril  (VASOTEC ) 20 MG tablet TAKE 1 TAB BY MOUTH EVERY MORNING AND 1/2 TAB BY MOUTH AT BEDTIME 135 tablet 0   fexofenadine  (ALLEGRA ) 60 MG tablet TAKE 1 TABLET BY MOUTH TWICE A DAY FOR ITCHING 180 tablet 1   fluocinonide (LIDEX) 0.05 % external solution Apply 1 application  topically daily as needed.     fluorouracil  (EFUDEX ) 5 % cream APPLY TO AFFECTED AREA EVERY DAY 40 g 0   Fluorouracil  4 % CREA Apply to affected areas once daily 40 g 1   fluticasone  (CUTIVATE ) 0.05 % cream Apply to affected areas of lower legs twice a day 60 g 1   furosemide  (LASIX ) 20 MG tablet 1 tab every other day 45 tablet 1   magnesium oxide (MAG-OX) 400 MG tablet Take 400 mg by mouth once a week.     Multiple Vitamin (MULTIVITAMIN) tablet Take 1 tablet by mouth once a week. Takes occ     Naftifine  HCl (NAFTIN ) 2 % GEL Apply to affected area qd-bid 60 g 3   Omega-3 Fatty Acids (FISH OIL PO) Take 1 capsule by mouth once a week.     triamcinolone cream (KENALOG) 0.1 % Apply 1 Application topically 2 (two) times daily.     metoprolol  succinate (TOPROL -XL) 25 MG 24 hr tablet Take 1 tablet (25 mg total) by mouth daily. 90 tablet 1   No facility-administered medications prior to visit.    Allergies  Allergen Reactions   Chlorthalidone      hyponatremia   Atenolol     Caused fatigue    Besivance [Besifloxacin Hcl]     rash   Codeine Nausea And Vomiting   Crestor  [Rosuvastatin ]     "leg gave away"   Gabapentin     Memory loss and confusion   Lipitor [Atorvastatin ] Other (See Comments)    Severe leg pain   Lovastatin      myalgia   Simvastatin     Her leg gave away on her and states thinks secondary to Simvastatin   Statins Support [Acid Blockers Support]     Other reaction(s): Unknown   Sulfa Antibiotics Nausea Only   Vigamox [Moxifloxacin]     rash   Welchol  [Colesevelam  Hcl]     Review of Systems As per HPI  PE:    04/03/2023    10:20 AM 04/03/2023   10:12 AM 01/01/2023    9:54 AM  Vitals with BMI  Weight  117 lbs 13 oz 116 lbs 6 oz  Systolic 150 161 841  Diastolic 86 81 73  Pulse  70 62     Physical Exam  Gen: Alert, well appearing.  Patient is oriented to person, place, time, and situation. AFFECT: pleasant, lucid thought and speech. CV: RRR, some  ectopy noted, no m/r/g.   LUNGS: CTA bilat, nonlabored resps, good aeration in all lung fields. EXT: no clubbing or cyanosis.  Minimal lymphedematous changes, no pitting edema.    LABS:  Last CBC Lab Results  Component Value Date   WBC 5.7 05/04/2021   HGB 14.2 05/04/2021   HCT 43.3 05/04/2021   MCV 94.3 05/04/2021   MCH 31.8 08/17/2020   RDW 13.5 05/04/2021   PLT 177.0 05/04/2021   Last metabolic panel Lab Results  Component Value Date   GLUCOSE 105 (H) 01/01/2023   NA 134 (L) 01/01/2023   K 4.4 01/01/2023   CL 94 (L) 01/01/2023   CO2 28 01/01/2023   BUN 13 01/01/2023   CREATININE 0.80 01/01/2023   GFR 61.44 01/01/2023   CALCIUM  10.1 01/01/2023   PHOS 3.5 05/07/2012   PROT 7.6 01/01/2023   ALBUMIN 4.7 01/01/2023   LABGLOB 2.8 09/29/2019   AGRATIO 1.6 09/29/2019   BILITOT 0.8 01/01/2023   ALKPHOS 66 01/01/2023   AST 13 01/01/2023   ALT 15 01/01/2023   ANIONGAP 11 05/02/2015   Last lipids Lab Results  Component Value Date   CHOL 234 (H) 09/29/2019   HDL 72 09/29/2019   LDLCALC 146 (H) 09/29/2019   LDLDIRECT 128.0 12/02/2012   TRIG 95 09/29/2019   CHOLHDL 3.3 09/29/2019   Lab Results  Component Value Date   VITAMINB12 314 08/17/2020   IMPRESSION AND PLAN:  #1 hypertension, well-controlled on amlodipine  5 mg a day, Toprol -XL 25 mg a day, and enalapril  20 mg every morning and 10 mg every afternoon. BMET today  #2 lymphedema.  She has minimal edema at this point.  She takes one of her 20 mg Lasix  every 3 to 5 days and I think this is fine to continue. She limits sodium well.  3. CAD, asymptomatic. She takes aspirin 81 mg a  day. Continue Toprol -XL 25 mg a day. History of statin intolerance and does not want trial of any further cholesterol-lowering medication.  An After Visit Summary was printed and given to the patient.  FOLLOW UP: Return in about 3 months (around 07/02/2023) for routine chronic illness f/u.  Signed:  Arletha Lady, MD           04/03/2023

## 2023-04-04 ENCOUNTER — Other Ambulatory Visit: Payer: Self-pay | Admitting: Family Medicine

## 2023-04-05 ENCOUNTER — Other Ambulatory Visit: Payer: Self-pay | Admitting: Family Medicine

## 2023-04-05 ENCOUNTER — Ambulatory Visit (HOSPITAL_BASED_OUTPATIENT_CLINIC_OR_DEPARTMENT_OTHER): Payer: Medicare PPO | Admitting: Family

## 2023-04-05 VITALS — BP 122/68 | HR 74 | Ht 60.0 in | Wt 119.6 lb

## 2023-04-05 DIAGNOSIS — I25118 Atherosclerotic heart disease of native coronary artery with other forms of angina pectoris: Secondary | ICD-10-CM | POA: Diagnosis not present

## 2023-04-05 DIAGNOSIS — I10 Essential (primary) hypertension: Secondary | ICD-10-CM

## 2023-04-05 NOTE — Patient Instructions (Signed)
Medication Instructions:  Your physician recommends that you continue on your current medications as directed. Please refer to the Current Medication list given to you today.   *If you need a refill on your cardiac medications before your next appointment, please call your pharmacy*  Lab Work: NONE   Testing/Procedures: NONE   Follow-Up: At [facility], you and your health needs are our priority.  As part of our continuing mission to provide you with exceptional heart care, we have created designated Provider Care Teams.  These Care Teams include your primary Cardiologist (physician) and Advanced Practice Providers (APPs -  Physician Assistants and Nurse Practitioners) who all work together to provide you with the care you need, when you need it.  We recommend signing up for the patient portal called "MyChart".  Sign up information is provided on this After Visit Summary.  MyChart is used to connect with patients for Virtual Visits (Telemedicine).  Patients are able to view lab/test results, encounter notes, upcoming appointments, etc.  Non-urgent messages can be sent to your provider as well.   To learn more about what you can do with MyChart, go to [email or web address].    Your next appointment:   6 month(s)  Provider:   Chilton Si, MD or Gillian Shields, NP    Other Instructions

## 2023-04-05 NOTE — Progress Notes (Unsigned)
Cardiology Office Note:  .   Date:  04/07/2023  ID:  Jennifer Peck, DOB [birth year], MRN [id number] PCP: Jeoffrey Massed, MD  [facility] Providers Cardiologist:  Chilton Si, MD    History of Present Illness: Marland Kitchen   Jennifer Peck is a 89 y.o. female with hx of CAD s/p PCI (BMS Cx, RCA, OM1, OM2 in 2011), HLD with statin/zetia intolerance, HTN. Previous patient of Dr. Patty Sermons having since established with Dr. Duke Salvia.   Echo 11/2020 LVEF 60-65%, gr2dd, mild MR.  Last seen 02/2022 with generalized itching dating back to 10/2019. She had stopped Aspirin due to easy bruising. Due to pain and tenderness in both legs, ABI ordered which were unremarkable. She was given 2 week trial off Metoprolol in case itching was side effect of the medication, when symptoms persisted Metoprolol was resumed.   Presents today for follow up with her daughter. Notes significant improvement in her edema after adding Lasix by PCP for lymphedema. She is taking every other day. No chest pain, dyspnea, orthopnea, PND. Continues to feel overall well.   ROS: Please see the history of present illness.    All other systems reviewed and are negative.   Studies Reviewed: Marland Kitchen   EKG Interpretation Date/Time:  Friday April 05 2023 15:16:50 EST Ventricular Rate:  70 PR Interval:  188 QRS Duration:  72 QT Interval:  408 QTC Calculation: 440 R Axis:   67  Text Interpretation: Normal sinus rhythm prior septal infarct, stable from prior Confirmed by Gillian Shields ([id number]) on 04/07/2023 3:32:10 PM    Cardiac Studies & Procedures     STRESS TESTS  NM MYOCAR MULTI W/SPECT W 12/05/2009  ECHOCARDIOGRAM  ECHOCARDIOGRAM COMPLETE 12/01/2020  Narrative ECHOCARDIOGRAM REPORT    Patient Name:   Jennifer Peck Date of Exam: 12/01/2020 Medical Rec #:  [id number]      Height:       60.0 in Accession #:    [id number]     Weight:       133.0 lb Date of Birth:  [birth year]      BSA:          1.569 m Patient Age:    89  years       BP:           131/68 mmHg Patient Gender: F              HR:           83 bpm. Exam Location:  Outpatient  Procedure: 2D Echo, Color Doppler and Cardiac Doppler  Indications:    Murmur  History:        Patient has no prior history of Echocardiogram examinations. Risk Factors:Hypertension, Dyslipidemia and Former Smoker. Premature Atrial Contraction.  Sonographer:    Jeryl Columbia RDCS Sonographer#2:  Dondra Prader RVT Referring Phys: [id number] TIFFANY Hamilton  IMPRESSIONS   1. Left ventricular ejection fraction, by estimation, is 60 to 65%. The left ventricle has normal function. The left ventricle has no regional wall motion abnormalities. Left ventricular diastolic parameters are consistent with Grade II diastolic dysfunction (pseudonormalization). 2. Right ventricular systolic function is normal. The right ventricular size is normal. There is mildly elevated pulmonary artery systolic pressure. The estimated right ventricular systolic pressure is 44.0 mmHg. 3. Left atrial size was severely dilated. 4. Right atrial size was severely dilated. 5. The mitral valve is normal in structure. Moderate mitral valve regurgitation. No evidence of mitral stenosis. 6. Tricuspid valve regurgitation is severe.  7. The aortic valve is normal in structure. Aortic valve regurgitation is trivial. Mild aortic valve sclerosis is present, with no evidence of aortic valve stenosis. 8. The inferior vena cava is normal in size with greater than 50% respiratory variability, suggesting right atrial pressure of 3 mmHg.  FINDINGS Left Ventricle: Left ventricular ejection fraction, by estimation, is 60 to 65%. The left ventricle has normal function. The left ventricle has no regional wall motion abnormalities. The left ventricular internal cavity size was normal in size. There is no left ventricular hypertrophy. Left ventricular diastolic parameters are consistent with Grade II diastolic dysfunction  (pseudonormalization).  Right Ventricle: The right ventricular size is normal. No increase in right ventricular wall thickness. Right ventricular systolic function is normal. There is mildly elevated pulmonary artery systolic pressure. The tricuspid regurgitant velocity is 3.20 m/s, and with an assumed right atrial pressure of 3 mmHg, the estimated right ventricular systolic pressure is 44.0 mmHg.  Left Atrium: Left atrial size was severely dilated.  Right Atrium: Right atrial size was severely dilated.  Pericardium: There is no evidence of pericardial effusion.  Mitral Valve: The mitral valve is normal in structure. Moderate mitral valve regurgitation, with posteriorly-directed jet. No evidence of mitral valve stenosis.  Tricuspid Valve: The tricuspid valve is normal in structure. Tricuspid valve regurgitation is severe. No evidence of tricuspid stenosis.  Aortic Valve: The aortic valve is normal in structure. Aortic valve regurgitation is trivial. Aortic regurgitation PHT measures 441 msec. Mild aortic valve sclerosis is present, with no evidence of aortic valve stenosis.  Pulmonic Valve: The pulmonic valve was normal in structure. Pulmonic valve regurgitation is not visualized. No evidence of pulmonic stenosis.  Aorta: The aortic root is normal in size and structure.  Venous: The inferior vena cava is normal in size with greater than 50% respiratory variability, suggesting right atrial pressure of 3 mmHg.  IAS/Shunts: No atrial level shunt detected by color flow Doppler.   LEFT VENTRICLE PLAX 2D LVIDd:         4.35 cm  Diastology LVIDs:         3.24 cm  LV e' medial:    7.62 cm/s LV PW:         0.85 cm  LV E/e' medial:  13.6 LV IVS:        0.94 cm  LV e' lateral:   8.92 cm/s LVOT diam:     2.00 cm  LV E/e' lateral: 11.7 LVOT Area:     3.14 cm   RIGHT VENTRICLE RV S prime:     21.80 cm/s TAPSE (M-mode): 2.1 cm  LEFT ATRIUM             Index       RIGHT ATRIUM            Index LA diam:        4.30 cm 2.74 cm/m  RA Area:     16.00 cm LA Vol (A2C):   95.4 ml 60.79 ml/m RA Volume:   35.10 ml  22.36 ml/m LA Vol (A4C):   95.5 ml 60.85 ml/m LA Biplane Vol: 98.8 ml 62.95 ml/m AORTIC VALVE          PULMONIC VALVE AI PHT:      441 msec PR End Diast Vel: 5.11 msec   MITRAL VALVE                TRICUSPID VALVE MV Area (PHT): 5.97 cm     TR Peak grad:  41.0 mmHg MV Decel Time: 127 msec     TR Vmax:        320.00 cm/s MR Peak grad: 146.4 mmHg MR Mean grad: 111.0 mmHg    SHUNTS MR Vmax:      605.00 cm/s   Systemic Diam: 2.00 cm MR Vmean:     512.0 cm/s MV E velocity: 104.00 cm/s MV A velocity: 89.40 cm/s MV E/A ratio:  1.16  Donato Schultz MD Electronically signed by Donato Schultz MD Signature Date/Time: 12/15/2020/1:42:49 PM    Final             Risk Assessment/Calculations:            Physical Exam:   VS:  BP 122/68   Pulse 74   Ht 5' (1.524 m)   Wt 119 lb 9.6 oz (54.3 kg)   SpO2 98%   BMI 23.36 kg/m    Wt Readings from Last 3 Encounters:  04/05/23 119 lb 9.6 oz (54.3 kg)  04/03/23 117 lb 12.8 oz (53.4 kg)  01/01/23 116 lb 6.4 oz (52.8 kg)    GEN: Well nourished, well developed in no acute distress NECK: No JVD; No carotid bruits CARDIAC: RRR, no murmurs, rubs, gallops RESPIRATORY:  Clear to auscultation without rales, wheezing or rhonchi  ABDOMEN: Soft, non-tender, non-distended EXTREMITIES:  No edema; No deformity   ASSESSMENT AND PLAN: .    HTN - BP well controlled. Continue current antihypertensive regimen.   CAD (prior bare metal stents) - Did not tolerate ASA due to bleeding. Did not tolerate statin, zetia and has declined to trial further lipid lowering agents. Continue Metoprolol. Recommend aiming for 150 minutes of moderate intensity activity per week and following a heart healthy diet.   Lymphedema - Per PCP. Encouraged leg elevation. ABI with no PAD.        Dispo: follow up in 6 mos  Signed, Alver Sorrow, NP

## 2023-04-07 ENCOUNTER — Encounter (HOSPITAL_BASED_OUTPATIENT_CLINIC_OR_DEPARTMENT_OTHER): Payer: Self-pay | Admitting: Family

## 2023-04-30 ENCOUNTER — Ambulatory Visit (INDEPENDENT_AMBULATORY_CARE_PROVIDER_SITE_OTHER): Payer: Medicare PPO | Admitting: Podiatry

## 2023-04-30 ENCOUNTER — Encounter: Payer: Self-pay | Admitting: Podiatry

## 2023-04-30 VITALS — Ht 60.0 in | Wt 119.0 lb

## 2023-04-30 DIAGNOSIS — Q828 Other specified congenital malformations of skin: Secondary | ICD-10-CM

## 2023-04-30 DIAGNOSIS — M79675 Pain in left toe(s): Secondary | ICD-10-CM | POA: Diagnosis not present

## 2023-04-30 DIAGNOSIS — M79674 Pain in right toe(s): Secondary | ICD-10-CM

## 2023-04-30 DIAGNOSIS — B351 Tinea unguium: Secondary | ICD-10-CM | POA: Diagnosis not present

## 2023-04-30 DIAGNOSIS — G629 Polyneuropathy, unspecified: Secondary | ICD-10-CM | POA: Diagnosis not present

## 2023-04-30 DIAGNOSIS — L84 Corns and callosities: Secondary | ICD-10-CM | POA: Diagnosis not present

## 2023-04-30 NOTE — Progress Notes (Signed)
Subjective:  Patient ID: Jennifer Peck, female    DOB: 1923/02/23,  MRN: [id number]  Jennifer Peck presents to clinic today for at risk foot care with history of peripheral neuropathy and corn(s)  of both feet, porokeratotic lesion(s) right foot and painful mycotic nails. Painful toenails interfere with ambulation. Aggravating factors include wearing enclosed shoe gear. Pain is relieved with periodic professional debridement. Painful corns and porokeratotic lesion(s) aggravated when weightbearing with and without shoegear. Pain is relieved with periodic professional debridement. She is accompanied by her daughter on today's visit. They are requesting foam toe separators which provide comfort daily between visits.  Chief Complaint  Patient presents with   RFC    She is here for a nail and corn trim, PCP is Dr. Milinda Cave, last  seen 1 month ago.    New problem(s): None.   PCP is McGowen, Maryjean Morn, MD.  Allergies  Allergen Reactions   Chlorthalidone     hyponatremia   Atenolol     Caused fatigue    Besivance [Besifloxacin Hcl]     rash   Codeine Nausea And Vomiting   Crestor [Rosuvastatin]     "leg gave away"   Gabapentin     Memory loss and confusion   Lipitor [Atorvastatin] Other (See Comments)    Severe leg pain   Lovastatin     myalgia   Simvastatin     Her leg gave away on her and states thinks secondary to Simvastatin   Statins Support [Acid Blockers Support]     Other reaction(s): Unknown   Sulfa Antibiotics Nausea Only   Vigamox [Moxifloxacin]     rash   Welchol [Colesevelam Hcl]     Review of Systems: Negative except as noted in the HPI.  Objective: No changes noted in today's physical examination. There were no vitals filed for this visit. Jennifer Peck is a pleasant 89 y.o. female WD, WN in NAD. AAO x 3.  Vascular Examination: CFT <3 seconds b/l. DP/PT pulses faintly palpable b/l. Skin temperature gradient warm to warm b/l. No pain with calf compression. No  ischemia or gangrene. No cyanosis or clubbing noted b/l. Evidence of chronic venous insufficiency b/l LE.   Neurological Examination: Protective sensation diminished with 10g monofilament b/l.  Dermatological Examination: Pedal skin warm and supple b/l.   No open wounds. No interdigital macerations.  Toenails 1-5 b/l thick, discolored, elongated with subungual debris and pain on dorsal palpation.    Hyperkeratotic lesion(s) interdigitally left 3rd digit and distal tip of right 3rd toe.  No erythema, no edema, no drainage, no fluctuance. Porokeratotic lesion(s) submet head 5 right foot. No erythema, no edema, no drainage, no fluctuance.  Musculoskeletal Examination: Hammertoe(s) noted to the 2-4 b/l.  Radiographs: None  Assessment/Plan: 1. Pain due to onychomycosis of toenails of both feet   2. Corns   3. Porokeratosis   4. Neuropathy     -Patient was evaluated today. All questions/concerns addressed on today's visit. -Patient to continue soft, supportive shoe gear daily. -Toenails 1-5 b/l were debrided in length and girth with sterile nail nippers and dremel without iatrogenic bleeding.  -Corn(s) bilateral 3rd toes pared utilizing sharp debridement with sterile blade without complication or incident. Total number debrided=2. -Porokeratotic lesion(s) submet head 5 right foot pared and enucleated with sterile currette without incident. Total number of lesions debrided=1. -Continue toe separator to afftected digits daily for protection. -Patient/POA to call should there be question/concern in the interim.   Return in about 3  months (around 07/28/2023).  Freddie Breech, DPM      [facility] LOCATION: [address], [place] [address]                   Office [phone]   [facility] LOCATION: [address] [place], [place] [address] Office [phone]

## 2023-06-12 ENCOUNTER — Other Ambulatory Visit: Payer: Self-pay | Admitting: Family Medicine

## 2023-06-19 DIAGNOSIS — Z85828 Personal history of other malignant neoplasm of skin: Secondary | ICD-10-CM | POA: Diagnosis not present

## 2023-06-19 DIAGNOSIS — L309 Dermatitis, unspecified: Secondary | ICD-10-CM | POA: Diagnosis not present

## 2023-06-19 DIAGNOSIS — L814 Other melanin hyperpigmentation: Secondary | ICD-10-CM | POA: Diagnosis not present

## 2023-06-19 DIAGNOSIS — Z85820 Personal history of malignant melanoma of skin: Secondary | ICD-10-CM | POA: Diagnosis not present

## 2023-06-19 DIAGNOSIS — L821 Other seborrheic keratosis: Secondary | ICD-10-CM | POA: Diagnosis not present

## 2023-06-19 DIAGNOSIS — D692 Other nonthrombocytopenic purpura: Secondary | ICD-10-CM | POA: Diagnosis not present

## 2023-06-19 DIAGNOSIS — D225 Melanocytic nevi of trunk: Secondary | ICD-10-CM | POA: Diagnosis not present

## 2023-06-19 DIAGNOSIS — L578 Other skin changes due to chronic exposure to nonionizing radiation: Secondary | ICD-10-CM | POA: Diagnosis not present

## 2023-07-02 ENCOUNTER — Ambulatory Visit: Payer: Medicare PPO | Admitting: Family Medicine

## 2023-07-02 ENCOUNTER — Encounter: Payer: Self-pay | Admitting: Family Medicine

## 2023-07-02 VITALS — BP 149/71 | HR 66 | Temp 97.9°F | Ht 60.0 in | Wt 119.0 lb

## 2023-07-02 DIAGNOSIS — I2510 Atherosclerotic heart disease of native coronary artery without angina pectoris: Secondary | ICD-10-CM | POA: Diagnosis not present

## 2023-07-02 DIAGNOSIS — I890 Lymphedema, not elsewhere classified: Secondary | ICD-10-CM | POA: Diagnosis not present

## 2023-07-02 DIAGNOSIS — I10 Essential (primary) hypertension: Secondary | ICD-10-CM

## 2023-07-02 MED ORDER — FEXOFENADINE HCL 60 MG PO TABS: ORAL_TABLET | ORAL | 1 refills | Status: AC

## 2023-07-02 NOTE — Progress Notes (Signed)
 OFFICE VISIT  07/02/2023  CC:  Chief Complaint  Patient presents with   Medical Management of Chronic Issues    Patient is a 89 y.o. female who presents accompanied by her daughter Ammon Bales for 70-month follow-up hypertension, lymphedema, and coronary artery disease. A/P as of last visit: ".#1 hypertension, well-controlled on amlodipine 5 mg a day, Toprol-XL 25 mg a day, and enalapril 20 mg every morning and 10 mg every afternoon. BMET today   #2 lymphedema.  She has minimal edema at this point.  She takes one of her 20 mg Lasix every 3 to 5 days and I think this is fine to continue. She limits sodium well.   3. CAD, asymptomatic. She takes aspirin 81 mg a day. Continue Toprol-XL 25 mg a day. History of statin intolerance and does not want trial of any further cholesterol-lowering medication"   INTERIM HX: Ia feels well.  She has some chronic sensitivity and pain on the bottoms of her feet when she walks.  Covers from the heel all the way to the toes.  This is chronic.  She has trouble walking greater than 200 feet without resting and requests paperwork for handicap placard.  Also has osteoarthritis of both knees leading to chronic pain and difficulty walking without resting frequently.  Home blood pressures consistently normal.  She takes Lasix milligram 3 days a week.  ROS as above, plus--> no fevers, no CP, no SOB, no wheezing, no cough, no dizziness, no HAs, no rashes, no melena/hematochezia.  No polyuria or polydipsia.  No myalgias or arthralgias.  No focal weakness, paresthesias, or tremors.  No acute vision or hearing abnormalities.  No dysuria or unusual/new urinary urgency or frequency.  No recent changes in lower legs. No n/v/d or abd pain.  No palpitations.    Past Medical History:  Diagnosis Date   Allergic state 04/30/2011   Anxiety    BCC (basal cell carcinoma), face 11/06/2010   Bilateral bunions 11/06/2010   Chronic venous insufficiency    lymphedema + venous  stasis pigmentation changes   Constipation 11/06/2010   Coronary artery disease 2011   BMS x 3   Diastolic heart failure (HCC)    GERD (gastroesophageal reflux disease) 05/11/2012   History of hyperkalemia 01/23/2011   Hyperlipidemia    Intolerant of statins and zetia.  Refused to consider PCSK9 inhibitor initially but as of 02/2017 cardiol f/u she would consider repatha if approved.   Hypertension    +white coat component   Lesion of labia 02/04/2012   Melanoma in situ [facility]) 08/2016   Derm= Dr. Dorisann Garre [facility] [place].   Mitral regurgitation 12/2020   moderate by echo 12/2020   Osteoarthritis of both knees    R>L   Osteopenia 11/23/2010; 02/2016   2017 T-score -1.9.  Repeat DEXA 2 yrs.   Peripheral neuropathy 11/06/2010   Small fiber, non-diabetic per neurologist   Right lumbar radiculopathy 01/2015   Improved with PT   SCC (squamous cell carcinoma), arm 11/06/2010   arm 2012.  R lower leg 10/2020   Seborrheic keratosis 04/30/2011   Vertigo     Past Surgical History:  Procedure Laterality Date   CARDIAC CATHETERIZATION  12/20/2009   3 bare metal stents   DEXA  03/05/2016   T-score -1.9   EYE SURGERY     cataract- left eye 09-24-11   LE arterial dopplers     03/2022 NORMAL   SKIN BIOPSY  08/2012   Shave excision of lesion  on pt's back: irritated seb keratosis.   TRANSTHORACIC ECHOCARDIOGRAM  12/05/2009   Mild LVH, normal systolic fxn, mild impaired relaxation.  No signif valvular dz. 12/01/20->EF 60-65%, grd II DD, mod MR, +mild pulm art htn/   TUBAL LIGATION      Outpatient Medications Prior to Visit  Medication Sig Dispense Refill   amLODipine (NORVASC) 5 MG tablet TAKE 1 TABLET (5 MG TOTAL) BY MOUTH DAILY. 90 tablet 0   aspirin EC 81 MG tablet Take 81 mg by mouth daily. Swallow whole.     azelastine (OPTIVAR) 0.05 % ophthalmic solution INSTILL TWO DROPS INTO EACH EYE TWICE DAILY 6 mL 12   B Complex Vitamins (B COMPLEX-B12 PO) Take by mouth daily.      enalapril (VASOTEC) 20 MG tablet TAKE 1 TAB BY MOUTH EVERY MORNING AND 1/2 TAB BY MOUTH AT BEDTIME 135 tablet 0   fluocinonide (LIDEX) 0.05 % external solution Apply 1 application  topically daily as needed.     fluorouracil (EFUDEX) 5 % cream APPLY TO AFFECTED AREA EVERY DAY 40 g 0   Fluorouracil 4 % CREA Apply to affected areas once daily 40 g 1   fluticasone (CUTIVATE) 0.05 % cream Apply to affected areas of lower legs twice a day 60 g 1   furosemide (LASIX) 20 MG tablet TAKE 1 TABLET BY MOUTH EVERY OTHER DAY 45 tablet 1   magnesium oxide (MAG-OX) 400 MG tablet Take 400 mg by mouth once a week.     metoprolol succinate (TOPROL-XL) 25 MG 24 hr tablet Take 1 tablet (25 mg total) by mouth daily. 90 tablet 3   Multiple Vitamin (MULTIVITAMIN) tablet Take 1 tablet by mouth once a week. Takes occ     Naftifine HCl (NAFTIN) 2 % GEL Apply to affected area qd-bid 60 g 3   Omega-3 Fatty Acids (FISH OIL PO) Take 1 capsule by mouth once a week.     triamcinolone cream (KENALOG) 0.1 % Apply 1 Application topically 2 (two) times daily.     fexofenadine (ALLEGRA) 60 MG tablet TAKE 1 TABLET BY MOUTH TWICE A DAY FOR ITCHING 180 tablet 1   No facility-administered medications prior to visit.    Allergies  Allergen Reactions   Chlorthalidone     hyponatremia   Atenolol     Caused fatigue    Besivance [Besifloxacin Hcl]     rash   Codeine Nausea And Vomiting   Crestor [Rosuvastatin]     "leg gave away"   Gabapentin     Memory loss and confusion   Lipitor [Atorvastatin] Other (See Comments)    Severe leg pain   Lovastatin     myalgia   Simvastatin     Her leg gave away on her and states thinks secondary to Simvastatin   Statins Support [Acid Blockers Support]     Other reaction(s): Unknown   Sulfa Antibiotics Nausea Only   Vigamox [Moxifloxacin]     rash   Welchol [Colesevelam Hcl]     Review of Systems As per HPI  PE:    07/02/2023   10:33 AM 07/02/2023   10:29 AM 04/30/2023   10:58  AM  Vitals with BMI  Height  5\' 0"  5\' 0"   Weight  119 lbs 119 lbs  BMI  23.24 23.24  Systolic 149 154   Diastolic 71 71   Pulse  66      Physical Exam  Gen: Alert, well appearing.  Patient is oriented to person, place, time,  and situation. AFFECT: pleasant, lucid thought and speech. CV: RRR with some ectopy, no murmur LUNGS CTA bilat EXT: She has no pitting edema.  LABS:  Last metabolic panel Lab Results  Component Value Date   GLUCOSE 94 04/03/2023   NA 134 (L) 04/03/2023   K 4.6 04/03/2023   CL 95 (L) 04/03/2023   CO2 31 04/03/2023   BUN 15 04/03/2023   CREATININE 0.74 04/03/2023   GFR 67.35 04/03/2023   CALCIUM 9.7 04/03/2023   PHOS 3.5 05/07/2012   PROT 7.6 01/01/2023   ALBUMIN 4.7 01/01/2023   LABGLOB 2.8 09/29/2019   AGRATIO 1.6 09/29/2019   BILITOT 0.8 01/01/2023   ALKPHOS 66 01/01/2023   AST 13 01/01/2023   ALT 15 01/01/2023   ANIONGAP 11 05/02/2015   Last lipids Lab Results  Component Value Date   CHOL 234 (H) 09/29/2019   HDL 72 09/29/2019   LDLCALC 146 (H) 09/29/2019   LDLDIRECT 128.0 12/02/2012   TRIG 95 09/29/2019   CHOLHDL 3.3 09/29/2019   IMPRESSION AND PLAN:  #1 hypertension, well-controlled on amlodipine 5 mg a day, Toprol-XL 25 mg a day, and enalapril 20 mg every morning and 10 mg every afternoon. Electrolytes and creatinine were normal about 3 months ago. No labs needed today.   #2 lymphedema.  She has minimal edema at this point.  She takes one of her 20 mg Lasix approximately 3 days/week and I think this is fine to continue. She limits sodium well.   3. CAD, asymptomatic. She takes aspirin 81 mg a day. Continue Toprol-XL 25 mg a day. History of statin intolerance and does not want trial of any further cholesterol-lowering medication  No labs needed today.  An After Visit Summary was printed and given to the patient.  FOLLOW UP: Return in about 3 months (around 10/01/2023) for routine chronic illness f/u.  Signed:  Arletha Lady, MD           07/02/2023

## 2023-07-17 ENCOUNTER — Ambulatory Visit (INDEPENDENT_AMBULATORY_CARE_PROVIDER_SITE_OTHER): Payer: Medicare Other | Admitting: *Deleted

## 2023-07-17 DIAGNOSIS — Z0000 Encounter for general adult medical examination without abnormal findings: Secondary | ICD-10-CM

## 2023-07-17 NOTE — Progress Notes (Signed)
 Subjective:   Jennifer Peck is a 89 y.o. female who presents for Medicare Annual (Subsequent) preventive examination.  Visit Complete: Virtual I connected with  Kendra Pavy on 07/17/23 by a audio enabled telemedicine application and verified that I am speaking with the correct person using two identifiers.  Patient Location: Home  Provider Location: Home Office  I discussed the limitations of evaluation and management by telemedicine. The patient expressed understanding and agreed to proceed.  Vital Signs: Because this visit was a virtual/telehealth visit, some criteria may be missing or patient reported. Any vitals not documented were not able to be obtained and vitals that have been documented are patient reported.    Cardiac Risk Factors include: advanced age (>84men, >43 women);family history of premature cardiovascular disease;hypertension     Objective:    There were no vitals filed for this visit. There is no height or weight on file to calculate BMI.     07/17/2023   11:56 AM 07/11/2022    9:58 AM 05/17/2021    9:51 AM 02/17/2020   11:18 AM 01/08/2019   10:32 AM 05/06/2017   10:27 AM 04/30/2016   10:41 AM  Advanced Directives  Does Patient Have a Medical Advance Directive? Yes Yes Yes Yes Yes Yes Yes  Type of Estate agent of [employer] Power of [place];Living will Healthcare Power of [place] Living will Healthcare Power of [place];Living will Living will;Healthcare Power of Attorney Living will  Copy of Healthcare Power of Attorney in Chart? No - copy requested No - copy requested No - copy requested  No - copy requested No - copy requested     Current Medications (verified) Outpatient Encounter Medications as of 07/17/2023  Medication Sig   amLODipine  (NORVASC ) 5 MG tablet TAKE 1 TABLET (5 MG TOTAL) BY MOUTH DAILY.   aspirin EC 81 MG tablet Take 81 mg by mouth daily. Swallow whole.   azelastine  (OPTIVAR ) 0.05 % ophthalmic solution  INSTILL TWO DROPS INTO EACH EYE TWICE DAILY   B Complex Vitamins (B COMPLEX-B12 PO) Take by mouth daily.   enalapril  (VASOTEC ) 20 MG tablet TAKE 1 TAB BY MOUTH EVERY MORNING AND 1/2 TAB BY MOUTH AT BEDTIME   fexofenadine  (ALLEGRA ) 60 MG tablet TAKE 1 TABLET BY MOUTH TWICE A DAY FOR ITCHING   fluocinonide (LIDEX) 0.05 % external solution Apply 1 application  topically daily as needed.   fluorouracil  (EFUDEX ) 5 % cream APPLY TO AFFECTED AREA EVERY DAY   Fluorouracil  4 % CREA Apply to affected areas once daily   fluticasone  (CUTIVATE ) 0.05 % cream Apply to affected areas of lower legs twice a day   furosemide  (LASIX ) 20 MG tablet TAKE 1 TABLET BY MOUTH EVERY OTHER DAY   magnesium oxide (MAG-OX) 400 MG tablet Take 400 mg by mouth once a week.   metoprolol  succinate (TOPROL -XL) 25 MG 24 hr tablet Take 1 tablet (25 mg total) by mouth daily.   Multiple Vitamin (MULTIVITAMIN) tablet Take 1 tablet by mouth once a week. Takes occ   Naftifine  HCl (NAFTIN ) 2 % GEL Apply to affected area qd-bid   Omega-3 Fatty Acids (FISH OIL PO) Take 1 capsule by mouth once a week.   triamcinolone cream (KENALOG) 0.1 % Apply 1 Application topically 2 (two) times daily.   No facility-administered encounter medications on file as of 07/17/2023.    Allergies (verified) Chlorthalidone , Atenolol, Besivance [besifloxacin hcl], Codeine, Crestor  [rosuvastatin ], Gabapentin, Lipitor [atorvastatin ], Lovastatin , Simvastatin, Statins support [acid blockers support], Sulfa antibiotics,  Vigamox [moxifloxacin], and Welchol  [colesevelam  hcl]   History: Past Medical History:  Diagnosis Date   Allergic state 04/30/2011   Anxiety    BCC (basal cell carcinoma), face 11/06/2010   Bilateral bunions 11/06/2010   Chronic venous insufficiency    lymphedema + venous stasis pigmentation changes   Constipation 11/06/2010   Coronary artery disease 2011   BMS x 3   Diastolic heart failure (HCC)    GERD (gastroesophageal reflux disease)  05/11/2012   History of hyperkalemia 01/23/2011   Hyperlipidemia    Intolerant of statins and zetia .  Refused to consider PCSK9 inhibitor initially but as of 02/2017 cardiol f/u she would consider repatha if approved.   Hypertension    +white coat component   Lesion of labia 02/04/2012   Melanoma in situ [facility]) 08/2016   Derm= Dr. Dorisann Garre [facility] [place].   Mitral regurgitation 12/2020   moderate by echo 12/2020   Osteoarthritis of both knees    R>L   Osteopenia 11/23/2010; 02/2016   2017 T-score -1.9.  Repeat DEXA 2 yrs.   Peripheral neuropathy 11/06/2010   Small fiber, non-diabetic per neurologist   Right lumbar radiculopathy 01/2015   Improved with PT   SCC (squamous cell carcinoma), arm 11/06/2010   arm 2012.  R lower leg 10/2020   Seborrheic keratosis 04/30/2011   Vertigo    Past Surgical History:  Procedure Laterality Date   CARDIAC CATHETERIZATION  12/20/2009   3 bare metal stents   DEXA  03/05/2016   T-score -1.9   EYE SURGERY     cataract- left eye 09-24-11   LE arterial dopplers     03/2022 NORMAL   SKIN BIOPSY  08/2012   Shave excision of lesion on pt's back: irritated seb keratosis.   TRANSTHORACIC ECHOCARDIOGRAM  12/05/2009   Mild LVH, normal systolic fxn, mild impaired relaxation.  No signif valvular dz. 12/01/20->EF 60-65%, grd II DD, mod MR, +mild pulm art htn/   TUBAL LIGATION     Family History  Problem Relation Age of Onset   Stroke Mother    Heart disease Father    Hypertension Daughter    Hyperlipidemia Daughter    Hypertension Son    Cancer Daughter        rectal/ chemo and radiation   Hypertension Daughter    Hypertension Son    Hyperlipidemia Son    Hypertension Son    Heart disease Brother    Vision loss Maternal Grandfather    Stroke Paternal Grandmother    Asthma Paternal Grandfather    Social History   Socioeconomic History   Marital status: Widowed    Spouse name: Not on file   Number of children: Not on file    Years of education: Not on file   Highest education level: Not on file  Occupational History   Not on file  Tobacco Use   Smoking status: Former    Current packs/day: 0.00    Types: Cigarettes    Quit date: 09/04/1977    Years since quitting: 45.8   Smokeless tobacco: Never  Vaping Use   Vaping status: Never Used  Substance and Sexual Activity   Alcohol use: Yes    Comment: only on Holidays   Drug use: No   Sexual activity: Not Currently  Other Topics Concern   Not on file  Social History Narrative   Lives alone near [place].  Two daughters in [place], [place].   Widowed about 30 yrs.  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   Former smoker: approx 30 pack-yr hx, quit 1979.   Occasional social alcohol.   Exercises 3 days a week at [place].   Takes knitting classes and computer classes.   Social Drivers of Corporate investment banker Strain: Low Risk  (07/17/2023)   Overall Financial Resource Strain (CARDIA)    Difficulty of Paying Living Expenses: Not hard at all  Food Insecurity: No Food Insecurity (07/17/2023)   Hunger Vital Sign    Worried About Running Out of Food in the Last Year: Never true    Ran Out of Food in the Last Year: Never true  Transportation Needs: No Transportation Needs (07/17/2023)   PRAPARE - Administrator, Civil Service (Medical): No    Lack of Transportation (Non-Medical): No  Physical Activity: Sufficiently Active (07/17/2023)   Exercise Vital Sign    Days of Exercise per Week: 3 days    Minutes of Exercise per Session: 60 min  Stress: No Stress Concern Present (07/17/2023)   [employer] of Occupational Health - Occupational Stress Questionnaire    Feeling of Stress : Not at all  Social Connections: Moderately Isolated (07/17/2023)   Social Connection and Isolation Panel [NHANES]    Frequency of Communication with Friends and Family: Once a week    Frequency of Social Gatherings  with Friends and Family: Twice a week    Attends Religious Services: Never    Database administrator or Organizations: Yes    Attends Engineer, structural: More than 4 times per year    Marital Status: Widowed    Tobacco Counseling Counseling given: Not Answered   Clinical Intake:  Pre-visit preparation completed: Yes  Pain : 0-10 Pain Type: Chronic pain Pain Location: Foot Pain Orientation: Left, Right Pain Descriptors / Indicators: Aching, Dull Pain Onset: 1 to 4 weeks ago Pain Frequency: Intermittent     Diabetes: No  How often do you need to have someone help you when you read instructions, pamphlets, or other written materials from your doctor or pharmacy?: 1 - Never  Interpreter Needed?: No  Information entered by :: Kieth Pelt LPN   Activities of Daily Living    07/17/2023   11:59 AM  In your present state of health, do you have any difficulty performing the following activities:  Hearing? 0  Vision? 0  Difficulty concentrating or making decisions? 0  Walking or climbing stairs? 0  Dressing or bathing? 0  Doing errands, shopping? 0  Preparing Food and eating ? N  Using the Toilet? N  In the past six months, have you accidently leaked urine? Y  Do you have problems with loss of bowel control? N  Managing your Medications? N  Managing your Finances? N  Housekeeping or managing your Housekeeping? Y    Patient Care Team: Shelvia Dick, MD as PCP - General (Family Medicine) Maudine Sos, MD as PCP - Cardiology (Cardiology) Maudine Sos, MD as Consulting Physician (Cardiology) [place], Thornell Flirt, MD as Referring Physician (Dermatology) Clemetine Cypher, [place] as Consulting Physician (Podiatry) Euell Herrlich, MD as Consulting Physician (Cardiology)  Indicate any recent Medical Services you may have received from other than [facility] providers in the past year (date may be approximate).     Assessment:   This is a routine wellness  examination for [facility].  Hearing/Vision screen Hearing Screening - Comments:: Does not wear hearing aids No trouble hearing Vision  Screening - Comments:: Up to date  [place]   Goals Addressed   None   Depression Screen    07/17/2023   12:02 PM 07/02/2023   10:31 AM 07/11/2022    9:55 AM 06/29/2022    2:04 PM 06/13/2022    8:45 AM 05/17/2021    9:47 AM 02/02/2021    9:45 AM  PHQ 2/9 Scores  PHQ - 2 Score 0 0 0 0 1 0 0  PHQ- 9 Score 2  0 1       Fall Risk    07/17/2023   11:55 AM 07/02/2023   10:30 AM 07/11/2022    9:59 AM 06/29/2022    2:04 PM 06/13/2022    8:45 AM  Fall Risk   Falls in the past year? 0 0 1 0 1  Number falls in past yr: 0 0 1 0 0  Injury with Fall? 0 0 0 0 0  Comment   fell in the garden no injury    Risk for fall due to :  No Fall Risks Impaired vision Impaired balance/gait No Fall Risks  Follow up Falls evaluation completed;Education provided;Falls prevention discussed Falls evaluation completed Falls prevention discussed Falls evaluation completed Falls evaluation completed    MEDICARE RISK AT HOME: Medicare Risk at Home Any stairs in or around the home?: No If so, are there any without handrails?: No Home free of loose throw rugs in walkways, pet beds, electrical cords, etc?: Yes Adequate lighting in your home to reduce risk of falls?: Yes Life alert?: No Use of a cane, walker or w/c?: No Grab bars in the bathroom?: Yes Shower chair or bench in shower?: Yes Elevated toilet seat or a handicapped toilet?: Yes  TIMED UP AND GO:  Was the test performed?  No    Cognitive Function:    04/30/2016   10:43 AM  MMSE - Mini Mental State Exam  Orientation to time 5  Orientation to Place 5  Registration 3  Attention/ Calculation 5  Recall 3  Language- name 2 objects 2  Language- repeat 1  Language- follow 3 step command 3  Language- read & follow direction 1  Write a sentence 1  Copy design 1  Total score 30        07/17/2023   11:57 AM 07/11/2022    10:01 AM 05/17/2021    9:56 AM  6CIT Screen  What Year? 0 points 0 points 0 points  What month? 0 points 0 points 0 points  What time? 0 points 0 points 0 points  Count back from 20 0 points 0 points 0 points  Months in reverse 0 points 0 points 0 points  Repeat phrase 0 points 0 points 0 points  Total Score 0 points 0 points 0 points    Immunizations Immunization History  Administered Date(s) Administered   Fluad Quad(high Dose 65+) 12/15/2018, 02/09/2020, 02/02/2021, 12/13/2021   Fluad Trivalent(High Dose 65+) 01/01/2023   Influenza Split 12/22/2010, 12/21/2011   Influenza Whole 02/15/2010   Influenza, High Dose Seasonal PF 02/01/2015, 01/02/2017, 01/28/2018   Influenza,inj,Quad PF,6+ Mos 12/02/2012, 01/21/2014   Influenza-Unspecified 01/18/2016   Moderna Covid-19 Fall Seasonal Vaccine 55yrs & older 01/23/2022, 01/09/2023   Moderna Covid-19 Vaccine Bivalent Booster 50yrs & up 01/18/2021   Moderna Sars-Covid-2 Vaccination 04/02/2019, 05/04/2019, 01/28/2020, 07/14/2020   Pneumococcal Conjugate-13 02/10/2014   Pneumococcal Polysaccharide-23 12/17/2012   Td 02/15/2010   Tdap 03/19/2010, 02/02/2021   Zoster Recombinant(Shingrix) 09/10/2017, 11/15/2017, 11/21/2017   Zoster, Live 03/20/2007  TDAP status: Up to date  Flu Vaccine status: Up to date  Pneumococcal vaccine status: Up to date  Covid-19 vaccine status: Information provided on how to obtain vaccines.   Qualifies for Shingles Vaccine? No   Zostavax completed Yes   Shingrix Completed?: Yes  Screening Tests Health Maintenance  Topic Date Due   COVID-19 Vaccine (8 - Moderna risk 2024-25 season) 07/10/2023   INFLUENZA VACCINE  10/18/2023   Medicare Annual Wellness (AWV)  07/16/2024   DTaP/Tdap/Td (4 - Td or Tdap) 02/03/2031   Pneumonia Vaccine 39+ Years old  Completed   DEXA SCAN  Completed   Zoster Vaccines- Shingrix  Completed   HPV VACCINES  Aged Out   Meningococcal B Vaccine  Aged Out    Health  Maintenance  Health Maintenance Due  Topic Date Due   COVID-19 Vaccine (8 - Moderna risk 2024-25 season) 07/10/2023    Colorectal cancer screening: No longer required.   Mammogram status: No longer required due to  .  Bone Density status: Completed 2017. Results reflect: Bone density results: OSTEOPENIA. Repeat every over due  Education provided years.  Lung Cancer Screening: (Low Dose CT Chest recommended if Age 73-80 years, 20 pack-year currently smoking OR have quit w/in 15years.) does not qualify.   Lung Cancer Screening Referral:   Additional Screening:  Hepatitis C Screening:   Never done  Vision Screening: Recommended annual ophthalmology exams for early detection of glaucoma and other disorders of the eye. Is the patient up to date with their annual eye exam?  Yes  Who is the provider or what is the name of the office in which the patient attends annual eye exams? [facility] If pt is not established with a provider, would they like to be referred to a provider to establish care? No .   Dental Screening: Recommended annual dental exams for proper oral hygiene   Community Resource Referral / Chronic Care Management: CRR required this visit?  No   CCM required this visit?  No     Plan:     I have personally reviewed and noted the following in the patient's chart:   Medical and social history Use of alcohol, tobacco or illicit drugs  Current medications and supplements including opioid prescriptions. Patient is not currently taking opioid prescriptions. Functional ability and status Nutritional status Physical activity Advanced directives List of other physicians Hospitalizations, surgeries, and ER visits in previous 12 months Vitals Screenings to include cognitive, depression, and falls Referrals and appointments  In addition, I have reviewed and discussed with patient certain preventive protocols, quality metrics, and best practice recommendations. A written  personalized care plan for preventive services as well as general preventive health recommendations were provided to patient.     Kieth Pelt, LPN   06/18/270   After Visit Summary: (MyChart) Due to this being a telephonic visit, the after visit summary with patients personalized plan was offered to patient via MyChart   Nurse Notes:

## 2023-07-17 NOTE — Patient Instructions (Signed)
 Ms. Jennifer Peck , Thank you for taking time to come for your Medicare Wellness Visit. I appreciate your ongoing commitment to your health goals. Please review the following plan we discussed and let me know if I can assist you in the future.   Screening recommendations/referrals: Colonoscopy: no longer required Mammogram: no longer required Bone Density: osteopenia  Education provided Recommended yearly ophthalmology/optometry visit for glaucoma screening and checkup Recommended yearly dental visit for hygiene and checkup  Vaccinations: Influenza vaccine: up to date Pneumococcal vaccine: up to date Tdap vaccine: up to date Shingles vaccine: up to date       Preventive Care 65 Years and Older, Female Preventive care refers to lifestyle choices and visits with your health care provider that can promote health and wellness. What does preventive care include? A yearly physical exam. This is also called an annual well check. Dental exams once or twice a year. Routine eye exams. Ask your health care provider how often you should have your eyes checked. Personal lifestyle choices, including: Daily care of your teeth and gums. Regular physical activity. Eating a healthy diet. Avoiding tobacco and drug use. Limiting alcohol use. Practicing safe sex. Taking low-dose aspirin every day. Taking vitamin and mineral supplements as recommended by your health care provider. What happens during an annual well check? The services and screenings done by your health care provider during your annual well check will depend on your age, overall health, lifestyle risk factors, and family history of disease. Counseling  Your health care provider may ask you questions about your: Alcohol use. Tobacco use. Drug use. Emotional well-being. Home and relationship well-being. Sexual activity. Eating habits. History of falls. Memory and ability to understand (cognition). Work and work  Astronomer. Reproductive health. Screening  You may have the following tests or measurements: Height, weight, and BMI. Blood pressure. Lipid and cholesterol levels. These may be checked every 5 years, or more frequently if you are over 65 years old. Skin check. Lung cancer screening. You may have this screening every year starting at age 51 if you have a 30-pack-year history of smoking and currently smoke or have quit within the past 15 years. Fecal occult blood test (FOBT) of the stool. You may have this test every year starting at age 38. Flexible sigmoidoscopy or colonoscopy. You may have a sigmoidoscopy every 5 years or a colonoscopy every 10 years starting at age 57. Hepatitis C blood test. Hepatitis B blood test. Sexually transmitted disease (STD) testing. Diabetes screening. This is done by checking your blood sugar (glucose) after you have not eaten for a while (fasting). You may have this done every 1-3 years. Bone density scan. This is done to screen for osteoporosis. You may have this done starting at age 39. Mammogram. This may be done every 1-2 years. Talk to your health care provider about how often you should have regular mammograms. Talk with your health care provider about your test results, treatment options, and if necessary, the need for more tests. Vaccines  Your health care provider may recommend certain vaccines, such as: Influenza vaccine. This is recommended every year. Tetanus, diphtheria, and acellular pertussis (Tdap, Td) vaccine. You may need a Td booster every 10 years. Zoster vaccine. You may need this after age 40. Pneumococcal 13-valent conjugate (PCV13) vaccine. One dose is recommended after age 107. Pneumococcal polysaccharide (PPSV23) vaccine. One dose is recommended after age 63. Talk to your health care provider about which screenings and vaccines you need and how often you need  them. This information is not intended to replace advice given to you by  your health care provider. Make sure you discuss any questions you have with your health care provider. Document Released: 04/01/2015 Document Revised: 11/23/2015 Document Reviewed: 01/04/2015 Elsevier Interactive Patient Education  2017 [employer].  Fall Prevention in the Home Falls can cause injuries. They can happen to people of all ages. There are many things you can do to make your home safe and to help prevent falls. What can I do on the outside of my home? Regularly fix the edges of walkways and driveways and fix any cracks. Remove anything that might make you trip as you walk through a door, such as a raised step or threshold. Trim any bushes or trees on the path to your home. Use bright outdoor lighting. Clear any walking paths of anything that might make someone trip, such as rocks or tools. Regularly check to see if handrails are loose or broken. Make sure that both sides of any steps have handrails. Any raised decks and porches should have guardrails on the edges. Have any leaves, snow, or ice cleared regularly. Use sand or salt on walking paths during winter. Clean up any spills in your garage right away. This includes oil or grease spills. What can I do in the bathroom? Use night lights. Install grab bars by the toilet and in the tub and shower. Do not use towel bars as grab bars. Use non-skid mats or decals in the tub or shower. If you need to sit down in the shower, use a plastic, non-slip stool. Keep the floor dry. Clean up any water that spills on the floor as soon as it happens. Remove soap buildup in the tub or shower regularly. Attach bath mats securely with double-sided non-slip rug tape. Do not have throw rugs and other things on the floor that can make you trip. What can I do in the bedroom? Use night lights. Make sure that you have a light by your bed that is easy to reach. Do not use any sheets or blankets that are too big for your bed. They should not hang  down onto the floor. Have a firm chair that has side arms. You can use this for support while you get dressed. Do not have throw rugs and other things on the floor that can make you trip. What can I do in the kitchen? Clean up any spills right away. Avoid walking on wet floors. Keep items that you use a lot in easy-to-reach places. If you need to reach something above you, use a strong step stool that has a grab bar. Keep electrical cords out of the way. Do not use floor polish or wax that makes floors slippery. If you must use wax, use non-skid floor wax. Do not have throw rugs and other things on the floor that can make you trip. What can I do with my stairs? Do not leave any items on the stairs. Make sure that there are handrails on both sides of the stairs and use them. Fix handrails that are broken or loose. Make sure that handrails are as long as the stairways. Check any carpeting to make sure that it is firmly attached to the stairs. Fix any carpet that is loose or worn. Avoid having throw rugs at the top or bottom of the stairs. If you do have throw rugs, attach them to the floor with carpet tape. Make sure that you have a light switch at  the top of the stairs and the bottom of the stairs. If you do not have them, ask someone to add them for you. What else can I do to help prevent falls? Wear shoes that: Do not have high heels. Have rubber bottoms. Are comfortable and fit you well. Are closed at the toe. Do not wear sandals. If you use a stepladder: Make sure that it is fully opened. Do not climb a closed stepladder. Make sure that both sides of the stepladder are locked into place. Ask someone to hold it for you, if possible. Clearly mark and make sure that you can see: Any grab bars or handrails. First and last steps. Where the edge of each step is. Use tools that help you move around (mobility aids) if they are needed. These  include: Canes. Walkers. Scooters. Crutches. Turn on the lights when you go into a dark area. Replace any light bulbs as soon as they burn out. Set up your furniture so you have a clear path. Avoid moving your furniture around. If any of your floors are uneven, fix them. If there are any pets around you, be aware of where they are. Review your medicines with your doctor. Some medicines can make you feel dizzy. This can increase your chance of falling. Ask your doctor what other things that you can do to help prevent falls. This information is not intended to replace advice given to you by your health care provider. Make sure you discuss any questions you have with your health care provider. Document Released: 12/30/2008 Document Revised: 08/11/2015 Document Reviewed: 04/09/2014 Elsevier Interactive Patient Education  2017 [employer].

## 2023-08-02 ENCOUNTER — Ambulatory Visit: Payer: Medicare PPO | Admitting: Podiatry

## 2023-08-02 ENCOUNTER — Encounter: Payer: Self-pay | Admitting: Podiatry

## 2023-08-02 VITALS — Ht 60.0 in | Wt 119.0 lb

## 2023-08-02 DIAGNOSIS — B351 Tinea unguium: Secondary | ICD-10-CM

## 2023-08-02 DIAGNOSIS — Q828 Other specified congenital malformations of skin: Secondary | ICD-10-CM

## 2023-08-02 DIAGNOSIS — G629 Polyneuropathy, unspecified: Secondary | ICD-10-CM | POA: Diagnosis not present

## 2023-08-02 DIAGNOSIS — M79674 Pain in right toe(s): Secondary | ICD-10-CM

## 2023-08-02 DIAGNOSIS — I739 Peripheral vascular disease, unspecified: Secondary | ICD-10-CM

## 2023-08-02 DIAGNOSIS — L84 Corns and callosities: Secondary | ICD-10-CM

## 2023-08-02 DIAGNOSIS — M79675 Pain in left toe(s): Secondary | ICD-10-CM | POA: Diagnosis not present

## 2023-08-08 ENCOUNTER — Encounter: Payer: Self-pay | Admitting: Podiatry

## 2023-08-08 NOTE — Progress Notes (Signed)
 Subjective:  Patient ID: Jennifer Peck, female    DOB: 1922/06/17,  MRN: [id number]  Jennifer Peck presents to clinic today for at risk foot care. Patient has h/o PAD and neuropathy and corn(s)  b/l feet, porokeratotic lesion(s) right foot and painful mycotic nails. Painful toenails interfere with ambulation. Aggravating factors include wearing enclosed shoe gear. Pain is relieved with periodic professional debridement. Painful corns and porokeratotic lesion(s) aggravated when weightbearing with and without shoegear. Pain is relieved with periodic professional debridement.  Chief Complaint  Patient presents with   Nail Problem    Pt is here for [facility] PCP is Dr Johnette Naegeli and LOV was in April.   New problem(s): None.   PCP is McGowen, Minetta Aly, MD.  Allergies  Allergen Reactions   Chlorthalidone      hyponatremia   Atenolol     Caused fatigue    Besivance [Besifloxacin Hcl]     rash   Codeine Nausea And Vomiting   Crestor  [Rosuvastatin ]     "leg gave away"   Gabapentin     Memory loss and confusion   Lipitor [Atorvastatin ] Other (See Comments)    Severe leg pain   Lovastatin      myalgia   Simvastatin     Her leg gave away on her and states thinks secondary to Simvastatin   Statins Support [Acid Blockers Support]     Other reaction(s): Unknown   Sulfa Antibiotics Nausea Only   Vigamox [Moxifloxacin]     rash   Welchol  [Colesevelam  Hcl]     Review of Systems: Negative except as noted in the HPI.  Objective: No changes noted in today's physical examination. There were no vitals filed for this visit. Jennifer Peck is a pleasant 89 y.o. female WD, WN in NAD. AAO x 3.  Vascular Examination: CFT <3 seconds b/l. DP pulses faintly palpable b/l. Nonpalpable PT pulses b/l. Skin temperature gradient warm to warm b/l. No pain with calf compression. No ischemia or gangrene. No cyanosis or clubbing noted b/l. Evidence of chronic venous insufficiency b/l LE.   Neurological  Examination: Protective sensation diminished with 10g monofilament b/l.  Dermatological Examination: Pedal skin warm and supple b/l.   No open wounds. No interdigital macerations.  Toenails 1-5 b/l thick, discolored, elongated with subungual debris and pain on dorsal palpation.    Hyperkeratotic lesion(s) interdigitally left 3rd digit and distal tip of right 3rd toe.  No erythema, no edema, no drainage, no fluctuance. Porokeratotic lesion(s) submet head 5 right foot. No erythema, no edema, no drainage, no fluctuance.  Musculoskeletal Examination: Hammertoe(s) noted to the 2-4 b/l.  Radiographs: None  Assessment/Plan: 1. Pain due to onychomycosis of toenails of both feet   2. Corns   3. Porokeratosis   4. Neuropathy   5. PVD (peripheral vascular disease) (HCC)    -Consent given for treatment as described below: -Examined patient. -Patient to continue soft, supportive shoe gear daily. -Mycotic toenails 1-5 bilaterally were debrided in length and girth with sterile nail nippers and dremel without incident. -Corn(s) bilateral 3rd toes pared utilizing sterile scalpel blade without complication or incident. Total number debrided=2. -Porokeratotic lesion(s) submet head 5 right foot pared and enucleated with sterile currette without incident. Total number of lesions debrided=1. -Patient/POA to call should there be question/concern in the interim.   Return in about 3 months (around 11/02/2023).  Jennifer Peck, DPM      [place] LOCATION: [address]  [place], [place] [address]                   Office [phone]   [facility] LOCATION: [address] [place], [place] [address] Office [phone]

## 2023-08-10 ENCOUNTER — Other Ambulatory Visit: Payer: Self-pay | Admitting: Family Medicine

## 2023-09-14 ENCOUNTER — Other Ambulatory Visit: Payer: Self-pay | Admitting: Family Medicine

## 2023-10-01 ENCOUNTER — Encounter: Payer: Self-pay | Admitting: Family Medicine

## 2023-10-01 ENCOUNTER — Ambulatory Visit: Admitting: Family Medicine

## 2023-10-01 VITALS — BP 147/78 | HR 69 | Wt 118.2 lb

## 2023-10-01 DIAGNOSIS — I890 Lymphedema, not elsewhere classified: Secondary | ICD-10-CM

## 2023-10-01 DIAGNOSIS — M25512 Pain in left shoulder: Secondary | ICD-10-CM

## 2023-10-01 DIAGNOSIS — I2510 Atherosclerotic heart disease of native coronary artery without angina pectoris: Secondary | ICD-10-CM

## 2023-10-01 DIAGNOSIS — I10 Essential (primary) hypertension: Secondary | ICD-10-CM

## 2023-10-01 LAB — BASIC METABOLIC PANEL WITH GFR
BUN: 19 mg/dL (ref 6–23)
CO2: 30 meq/L (ref 19–32)
Calcium: 10.1 mg/dL (ref 8.4–10.5)
Chloride: 97 meq/L (ref 96–112)
Creatinine, Ser: 0.7 mg/dL (ref 0.40–1.20)
GFR: 71.74 mL/min (ref >60.00)
Glucose, Bld: 89 mg/dL (ref 70–99)
Potassium: 4.7 meq/L (ref 3.5–5.1)
Sodium: 134 meq/L — ABNORMAL LOW (ref 135–145)

## 2023-10-01 MED ORDER — FUROSEMIDE 20 MG PO TABS: 20.0000 mg | ORAL_TABLET | ORAL | 1 refills | Status: DC

## 2023-10-01 NOTE — Progress Notes (Signed)
 OFFICE VISIT  10/01/2023  CC:  Chief Complaint  Patient presents with   Medical Management of Chronic Issues    3 month Rci. She has been having some aching in her left arm.     Patient is a 89 y.o. female who presents accompanied by her daughter for 49-month follow-up hypertension, lymphedema, and coronary artery disease. A/P as of last visit: #1 hypertension, well-controlled on amlodipine  5 mg a day, Toprol -XL 25 mg a day, and enalapril  20 mg every morning and 10 mg every afternoon. Electrolytes and creatinine were normal about 3 months ago. No labs needed today.   #2 lymphedema.  She has minimal edema at this point.  She takes one of her 20 mg Lasix  approximately 3 days/week and I think this is fine to continue. She limits sodium well.   3. CAD, asymptomatic. She takes aspirin 81 mg a day. Continue Toprol -XL 25 mg a day. History of statin intolerance and does not want trial of any further cholesterol-lowering medication No labs needed today.  INTERIM HX: She has been feeling well other than some left shoulder and left upper arm pain for the last week or so. No preceding injury or overuse. Hurts to ABduct and reach out. No worsening of the pain with ambulation. No diaphoresis, nausea, jaw pain, chest pain, shortness of breath, or palpitations. No neck pain.  No paresthesias or arm weakness.  Past Medical History:  Diagnosis Date   Allergic state 04/30/2011   Anxiety    BCC (basal cell carcinoma), face 11/06/2010   Bilateral bunions 11/06/2010   Chronic venous insufficiency    lymphedema + venous stasis pigmentation changes   Constipation 11/06/2010   Coronary artery disease 2011   BMS x 3   Diastolic heart failure (HCC)    GERD (gastroesophageal reflux disease) 05/11/2012   History of hyperkalemia 01/23/2011   Hyperlipidemia    Intolerant of statins and zetia .  Refused to consider PCSK9 inhibitor initially but as of 02/2017 cardiol f/u she would consider repatha if  approved.   Hypertension    +white coat component   Lesion of labia 02/04/2012   Melanoma in situ [facility]) 08/2016   Derm= Dr. Tricia @ [facility] in [place].   Mitral regurgitation 12/2020   moderate by echo 12/2020   Osteoarthritis of both knees    R>L   Osteopenia 11/23/2010; 02/2016   2017 T-score -1.9.  Repeat DEXA 2 yrs.   Peripheral neuropathy 11/06/2010   Small fiber, non-diabetic per neurologist   Right lumbar radiculopathy 01/2015   Improved with PT   SCC (squamous cell carcinoma), arm 11/06/2010   arm 2012.  R lower leg 10/2020   Seborrheic keratosis 04/30/2011   Vertigo     Past Surgical History:  Procedure Laterality Date   CARDIAC CATHETERIZATION  12/20/2009   3 bare metal stents   DEXA  03/05/2016   T-score -1.9   EYE SURGERY     cataract- left eye 09-24-11   LE arterial dopplers     03/2022 NORMAL   SKIN BIOPSY  08/2012   Shave excision of lesion on pt's back: irritated seb keratosis.   TRANSTHORACIC ECHOCARDIOGRAM  12/05/2009   Mild LVH, normal systolic fxn, mild impaired relaxation.  No signif valvular dz. 12/01/20->EF 60-65%, grd II DD, mod MR, +mild pulm art htn/   TUBAL LIGATION      Outpatient Medications Prior to Visit  Medication Sig Dispense Refill   amLODipine  (NORVASC ) 5 MG tablet TAKE 1 TABLET (5 MG  TOTAL) BY MOUTH DAILY. 90 tablet 0   aspirin EC 81 MG tablet Take 81 mg by mouth daily. Swallow whole.     azelastine  (OPTIVAR ) 0.05 % ophthalmic solution INSTILL TWO DROPS INTO EACH EYE TWICE DAILY 6 mL 12   B Complex Vitamins (B COMPLEX-B12 PO) Take by mouth daily.     enalapril  (VASOTEC ) 20 MG tablet TAKE 1 TAB BY MOUTH EVERY MORNING AND 1/2 TAB BY MOUTH AT BEDTIME 135 tablet 0   fexofenadine  (ALLEGRA ) 60 MG tablet TAKE 1 TABLET BY MOUTH TWICE A DAY FOR ITCHING 180 tablet 1   fluocinonide (LIDEX) 0.05 % external solution Apply 1 application  topically daily as needed.     fluorouracil  (EFUDEX ) 5 % cream APPLY TO AFFECTED AREA EVERY DAY 40 g  0   Fluorouracil  4 % CREA Apply to affected areas once daily 40 g 1   fluticasone  (CUTIVATE ) 0.05 % cream Apply to affected areas of lower legs twice a day 60 g 1   magnesium oxide (MAG-OX) 400 MG tablet Take 400 mg by mouth once a week.     metoprolol  succinate (TOPROL -XL) 25 MG 24 hr tablet Take 1 tablet (25 mg total) by mouth daily. 90 tablet 3   Multiple Vitamin (MULTIVITAMIN) tablet Take 1 tablet by mouth once a week. Takes occ     Naftifine  HCl (NAFTIN ) 2 % GEL Apply to affected area qd-bid 60 g 3   Omega-3 Fatty Acids (FISH OIL PO) Take 1 capsule by mouth once a week.     triamcinolone cream (KENALOG) 0.1 % Apply 1 Application topically 2 (two) times daily.     furosemide  (LASIX ) 20 MG tablet TAKE 1 TABLET BY MOUTH EVERY OTHER DAY 45 tablet 1   No facility-administered medications prior to visit.    Allergies  Allergen Reactions   Chlorthalidone      hyponatremia   Atenolol     Caused fatigue    Besivance [Besifloxacin Hcl]     rash   Codeine Nausea And Vomiting   Crestor  [Rosuvastatin ]     leg gave away   Gabapentin     Memory loss and confusion   Lipitor [Atorvastatin ] Other (See Comments)    Severe leg pain   Lovastatin      myalgia   Simvastatin     Her leg gave away on her and states thinks secondary to Simvastatin   Statins Support [Acid Blockers Support]     Other reaction(s): Unknown   Sulfa Antibiotics Nausea Only   Vigamox [Moxifloxacin]     rash   Welchol  [Colesevelam  Hcl]     Review of Systems As per HPI  PE:    10/01/2023   10:27 AM 08/02/2023   10:37 AM 07/02/2023   10:33 AM  Vitals with BMI  Height  5' 0   Weight 118 lbs 3 oz 119 lbs   BMI  23.24   Systolic 147  149  Diastolic 78  71  Pulse 69       Physical Exam  Gen: Alert, well appearing.  Patient is oriented to person, place, time, and situation. AFFECT: pleasant, lucid thought and speech. Cardiovascular: RRR, occ ectopy, no murmur Chest is clear, no wheezing or rales. Normal  symmetric air entry throughout both lung fields. No chest wall deformities or tenderness. EXT: no pitting edema.   Left shoulder without tenderness to palpation.  Hawkins positive.  Neer's positive.  External rotation does not elicit pain. Arm strength intact.  Neurovascular intact.  LABS:  Last CBC Lab Results  Component Value Date   WBC 5.7 05/04/2021   HGB 14.2 05/04/2021   HCT 43.3 05/04/2021   MCV 94.3 05/04/2021   MCH 31.8 08/17/2020   RDW 13.5 05/04/2021   PLT 177.0 05/04/2021   Last metabolic panel Lab Results  Component Value Date   GLUCOSE 94 04/03/2023   NA 134 (L) 04/03/2023   K 4.6 04/03/2023   CL 95 (L) 04/03/2023   CO2 31 04/03/2023   BUN 15 04/03/2023   CREATININE 0.74 04/03/2023   GFR 67.35 04/03/2023   CALCIUM  9.7 04/03/2023   PHOS 3.5 05/07/2012   PROT 7.6 01/01/2023   ALBUMIN 4.7 01/01/2023   LABGLOB 2.8 09/29/2019   AGRATIO 1.6 09/29/2019   BILITOT 0.8 01/01/2023   ALKPHOS 66 01/01/2023   AST 13 01/01/2023   ALT 15 01/01/2023   ANIONGAP 11 05/02/2015   Last lipids Lab Results  Component Value Date   CHOL 234 (H) 09/29/2019   HDL 72 09/29/2019   LDLCALC 146 (H) 09/29/2019   LDLDIRECT 128.0 12/02/2012   TRIG 95 09/29/2019   CHOLHDL 3.3 09/29/2019    IMPRESSION AND PLAN:  #1 hypertension, well-controlled on amlodipine  5 mg a day, Toprol -XL 25 mg a day, and enalapril  20 mg every morning and 10 mg every afternoon. Electrolytes and creatinine were normal about 6 months ago. Basic metabolic panel today.   #2 lymphedema.  She has no pitting edema at this point.  She takes one of her 20 mg Lasix  approximately 3 days/week and I think this is fine to continue. She limits sodium well. Basic metabolic panel today.   3. CAD, asymptomatic. She takes aspirin 81 mg a day. Continue Toprol -XL 25 mg a day. History of statin intolerance and does not want trial of any further cholesterol-lowering medication  #4 left shoulder pain.  She has exam  consistent with some impingement. I reassured her that I do not think her pain is due to coronary ischemia.  An After Visit Summary was printed and given to the patient.  FOLLOW UP: Return in about 3 months (around 01/01/2024) for routine chronic illness f/u.  Signed:  Gerlene Hockey, MD           10/01/2023

## 2023-10-02 ENCOUNTER — Ambulatory Visit: Payer: Self-pay | Admitting: Family Medicine

## 2023-10-02 NOTE — Telephone Encounter (Signed)
 Results given to pt

## 2023-11-08 ENCOUNTER — Other Ambulatory Visit: Payer: Self-pay | Admitting: Family Medicine

## 2023-11-13 ENCOUNTER — Ambulatory Visit: Admitting: Podiatry

## 2023-11-13 ENCOUNTER — Encounter: Payer: Self-pay | Admitting: Podiatry

## 2023-11-13 DIAGNOSIS — I739 Peripheral vascular disease, unspecified: Secondary | ICD-10-CM

## 2023-11-13 DIAGNOSIS — L84 Corns and callosities: Secondary | ICD-10-CM | POA: Diagnosis not present

## 2023-11-13 DIAGNOSIS — Q828 Other specified congenital malformations of skin: Secondary | ICD-10-CM | POA: Diagnosis not present

## 2023-11-13 DIAGNOSIS — G629 Polyneuropathy, unspecified: Secondary | ICD-10-CM

## 2023-11-16 NOTE — Progress Notes (Signed)
 Subjective:  Patient ID: Jennifer Peck, female    DOB: 05-01-1922,  MRN: [id number]  Jennifer Peck presents to clinic today for at risk foot care. Patient has h/o PAD and neuropathy and painful porokeratotic lesion(s) right foot , corns interdigitally left 3rd toe and painful mycotic toenails that limit ambulation. Painful toenails interfere with ambulation. Aggravating factors include wearing enclosed shoe gear. Pain is relieved with periodic professional debridement. Painful porokeratotic lesions are aggravated when weightbearing with and without shoegear. Pain is relieved with periodic professional debridement.  Chief Complaint  Patient presents with   RFC Non diabetic toenail trim.     RFC Non diabetic toenail trim.Thick yellow toenails. LOV with PCP 10/01/23.   New problem(s): None.   PCP is McGowen, Aleene DEL, MD.  Allergies  Allergen Reactions   Chlorthalidone      hyponatremia   Atenolol     Caused fatigue    Besivance [Besifloxacin Hcl]     rash   Codeine Nausea And Vomiting   Crestor  [Rosuvastatin ]     leg gave away   Gabapentin     Memory loss and confusion   Lipitor [Atorvastatin ] Other (See Comments)    Severe leg pain   Lovastatin      myalgia   Simvastatin     Her leg gave away on her and states thinks secondary to Simvastatin   Statins Support [Acid Blockers Support]     Other reaction(s): Unknown   Sulfa Antibiotics Nausea Only   Vigamox [Moxifloxacin]     rash   Welchol  [Colesevelam  Hcl]     Review of Systems: Negative except as noted in the HPI.  Objective: No changes noted in today's physical examination. There were no vitals filed for this visit. Jennifer Peck is a pleasant 89 y.o. female WD, WN in NAD. AAO x 3.  Vascular Examination: CFT <3 seconds b/l. DP pulses faintly palpable b/l. Nonpalpable PT pulses b/l. Skin temperature gradient warm to warm b/l. No pain with calf compression. No ischemia or gangrene. No cyanosis or clubbing noted b/l.  Evidence of chronic venous insufficiency b/l LE.   Neurological Examination: Protective sensation diminished with 10g monofilament b/l.  Dermatological Examination: Pedal skin warm and supple b/l.   No open wounds. No interdigital macerations.  Toenails 1-5 b/l thick, discolored, elongated with subungual debris and pain on dorsal palpation.    Hyperkeratotic lesion(s) interdigitally left 3rd digit and distal tip of right 3rd toe.  No erythema, no edema, no drainage, no fluctuance. Porokeratotic lesion(s) submet head 5 right foot. No erythema, no edema, no drainage, no fluctuance.  Musculoskeletal Examination: Hammertoe(s) noted to the 2-4 b/l.  Radiographs: None  Assessment/Plan: 1. Neuropathy   2. PVD (peripheral vascular disease) (HCC)   3. Corns   4. Porokeratosis   -Patient was evaluated today. All questions/concerns addressed on today's visit. -Patient's family member present. All questions/concerns addressed on today's visit. -Patient to continue soft, supportive shoe gear daily. -Toenails 1-5 b/l were debrided in length and girth with sterile nail nippers and dremel without iatrogenic bleeding.  -Corn(s) distal tip of right 3rd toe and lateral DIPJ of L 3rd toe pared utilizing sterile scalpel blade without complication or incident. Total number debrided=2. -Porokeratotic lesion(s) submet head 5 right foot pared and enucleated with sterile currette without incident. Total number of lesions debrided=1. -Patient/POA to call should there be question/concern in the interim.   Return in about 3 months (around 02/13/2024).  Jennifer Peck, DPM  [place] LOCATION: [address], [place] [address]                   Office [phone]   [facility] LOCATION: [address] [place], [place] [address] Office [phone]

## 2023-12-26 ENCOUNTER — Other Ambulatory Visit: Payer: Self-pay | Admitting: Family Medicine

## 2024-01-02 ENCOUNTER — Encounter: Payer: Self-pay | Admitting: Family Medicine

## 2024-01-02 ENCOUNTER — Ambulatory Visit: Admitting: Family Medicine

## 2024-01-02 VITALS — BP 124/69 | HR 60 | Temp 97.6°F | Ht 60.0 in | Wt 124.6 lb

## 2024-01-02 DIAGNOSIS — I890 Lymphedema, not elsewhere classified: Secondary | ICD-10-CM | POA: Diagnosis not present

## 2024-01-02 DIAGNOSIS — I10 Essential (primary) hypertension: Secondary | ICD-10-CM

## 2024-01-02 DIAGNOSIS — I872 Venous insufficiency (chronic) (peripheral): Secondary | ICD-10-CM | POA: Diagnosis not present

## 2024-01-02 DIAGNOSIS — Z23 Encounter for immunization: Secondary | ICD-10-CM | POA: Diagnosis not present

## 2024-01-02 NOTE — Progress Notes (Signed)
 OFFICE VISIT  01/02/2024  CC:  Chief Complaint  Patient presents with   Medical Management of Chronic Issues    Patient is a 89 y.o. female who presents accompanied by her daughter for 19-month follow-up hypertension and lymphedema. A/P as of last visit: #1 hypertension, well-controlled on amlodipine  5 mg a day, Toprol -XL 25 mg a day, and enalapril  20 mg every morning and 10 mg every afternoon. Electrolytes and creatinine were normal about 6 months ago. Basic metabolic panel today.   #2 lymphedema.  She has no pitting edema at this point.  She takes one of her 20 mg Lasix  approximately 3 days/week and I think this is fine to continue. She limits sodium well. Basic metabolic panel today.   3. CAD, asymptomatic. She takes aspirin 81 mg a day. Continue Toprol -XL 25 mg a day. History of statin intolerance and does not want trial of any further cholesterol-lowering medication   #4 left shoulder pain.  She has exam consistent with some impingement. I reassured her that I do not think her pain is due to coronary ischemia.  INTERIM HX: All lab results normal last visit. [place] feels well. She does an hour of senior exercises 3 days a week.  She is compliant with all of her medications.  No acute concerns.  Past Medical History:  Diagnosis Date   Allergic state 04/30/2011   Anxiety    BCC (basal cell carcinoma), face 11/06/2010   Bilateral bunions 11/06/2010   Chronic venous insufficiency    lymphedema + venous stasis pigmentation changes   Constipation 11/06/2010   Coronary artery disease 2011   BMS x 3   Diastolic heart failure (HCC)    GERD (gastroesophageal reflux disease) 05/11/2012   History of hyperkalemia 01/23/2011   Hyperlipidemia    Intolerant of statins and zetia .  Refused to consider PCSK9 inhibitor initially but as of 02/2017 cardiol f/u she would consider repatha if approved.   Hypertension    +white coat component   Lesion of labia 02/04/2012   Melanoma  in situ [facility]) 08/2016   Derm= Dr. Tricia @ [facility] in [place].   Mitral regurgitation 12/2020   moderate by echo 12/2020   Osteoarthritis of both knees    R>L   Osteopenia 11/23/2010; 02/2016   2017 T-score -1.9.  Repeat DEXA 2 yrs.   Peripheral neuropathy 11/06/2010   Small fiber, non-diabetic per neurologist   Right lumbar radiculopathy 01/2015   Improved with PT   SCC (squamous cell carcinoma), arm 11/06/2010   arm 2012.  R lower leg 10/2020   Seborrheic keratosis 04/30/2011   Vertigo     Past Surgical History:  Procedure Laterality Date   CARDIAC CATHETERIZATION  12/20/2009   3 bare metal stents   DEXA  03/05/2016   T-score -1.9   EYE SURGERY     cataract- left eye 09-24-11   LE arterial dopplers     03/2022 NORMAL   SKIN BIOPSY  08/2012   Shave excision of lesion on pt's back: irritated seb keratosis.   TRANSTHORACIC ECHOCARDIOGRAM  12/05/2009   Mild LVH, normal systolic fxn, mild impaired relaxation.  No signif valvular dz. 12/01/20->EF 60-65%, grd II DD, mod MR, +mild pulm art htn/   TUBAL LIGATION      Outpatient Medications Prior to Visit  Medication Sig Dispense Refill   amLODipine  (NORVASC ) 5 MG tablet TAKE 1 TABLET (5 MG TOTAL) BY MOUTH DAILY. 30 tablet 0   aspirin EC 81 MG tablet Take 81 mg  by mouth daily. Swallow whole.     azelastine  (OPTIVAR ) 0.05 % ophthalmic solution INSTILL TWO DROPS INTO EACH EYE TWICE DAILY 6 mL 12   B Complex Vitamins (B COMPLEX-B12 PO) Take by mouth daily.     enalapril  (VASOTEC ) 20 MG tablet TAKE 1 TAB BY MOUTH EVERY MORNING AND 1/2 TAB BY MOUTH AT BEDTIME 135 tablet 0   fexofenadine  (ALLEGRA ) 60 MG tablet TAKE 1 TABLET BY MOUTH TWICE A DAY FOR ITCHING 180 tablet 1   fluocinonide (LIDEX) 0.05 % external solution Apply 1 application  topically daily as needed.     fluorouracil  (EFUDEX ) 5 % cream APPLY TO AFFECTED AREA EVERY DAY 40 g 0   Fluorouracil  4 % CREA Apply to affected areas once daily 40 g 1   fluticasone   (CUTIVATE ) 0.05 % cream Apply to affected areas of lower legs twice a day 60 g 1   furosemide  (LASIX ) 20 MG tablet Take 1 tablet (20 mg total) by mouth every other day. 45 tablet 1   magnesium oxide (MAG-OX) 400 MG tablet Take 400 mg by mouth once a week.     metoprolol  succinate (TOPROL -XL) 25 MG 24 hr tablet Take 1 tablet (25 mg total) by mouth daily. 90 tablet 3   Multiple Vitamin (MULTIVITAMIN) tablet Take 1 tablet by mouth once a week. Takes occ     Naftifine  HCl (NAFTIN ) 2 % GEL Apply to affected area qd-bid 60 g 3   Omega-3 Fatty Acids (FISH OIL PO) Take 1 capsule by mouth once a week.     triamcinolone cream (KENALOG) 0.1 % Apply 1 Application topically 2 (two) times daily.     No facility-administered medications prior to visit.    Allergies  Allergen Reactions   Chlorthalidone      hyponatremia   Atenolol     Caused fatigue    Besivance [Besifloxacin Hcl]     rash   Codeine Nausea And Vomiting   Crestor  [Rosuvastatin ]     leg gave away   Gabapentin     Memory loss and confusion   Lipitor [Atorvastatin ] Other (See Comments)    Severe leg pain   Lovastatin      myalgia   Simvastatin     Her leg gave away on her and states thinks secondary to Simvastatin   Statins Support [Acid Blockers Support]     Other reaction(s): Unknown   Sulfa Antibiotics Nausea Only   Vigamox [Moxifloxacin]     rash   Welchol  [Colesevelam  Hcl]     Review of Systems As per HPI  PE:    01/02/2024    1:21 PM 10/01/2023   10:27 AM 08/02/2023   10:37 AM  Vitals with BMI  Height 5' 0  5' 0  Weight 124 lbs 10 oz 118 lbs 3 oz 119 lbs  BMI 24.33  23.24  Systolic 124 147   Diastolic 69 78   Pulse 60 69    Physical Exam  Gen: Alert, well appearing.  Patient is oriented to person, place, time, and situation. AFFECT: pleasant, lucid thought and speech. CV: RRR, soft systolic flow murmur , no r/g.   LUNGS: CTA bilat, nonlabored resps, good aeration in all lung fields. Extremities: She  has mild amount of nonpitting edema bilateral lower legs.  She has some scattered hyperpigmentation changes over the pretibial surfaces bilaterally.  No pitting edema.  LABS:  Last CBC Lab Results  Component Value Date   WBC 5.7 05/04/2021   HGB 14.2 05/04/2021  HCT 43.3 05/04/2021   MCV 94.3 05/04/2021   MCH 31.8 08/17/2020   RDW 13.5 05/04/2021   PLT 177.0 05/04/2021   Last metabolic panel Lab Results  Component Value Date   GLUCOSE 89 10/01/2023   NA 134 (L) 10/01/2023   K 4.7 10/01/2023   CL 97 10/01/2023   CO2 30 10/01/2023   BUN 19 10/01/2023   CREATININE 0.70 10/01/2023   GFR 71.74 10/01/2023   CALCIUM  10.1 10/01/2023   PHOS 3.5 05/07/2012   PROT 7.6 01/01/2023   ALBUMIN 4.7 01/01/2023   LABGLOB 2.8 09/29/2019   AGRATIO 1.6 09/29/2019   BILITOT 0.8 01/01/2023   ALKPHOS 66 01/01/2023   AST 13 01/01/2023   ALT 15 01/01/2023   ANIONGAP 11 05/02/2015   Last lipids Lab Results  Component Value Date   CHOL 234 (H) 09/29/2019   HDL 72 09/29/2019   LDLCALC 146 (H) 09/29/2019   LDLDIRECT 128.0 12/02/2012   TRIG 95 09/29/2019   CHOLHDL 3.3 09/29/2019   IMPRESSION AND PLAN:  #1 hypertension, well-controlled on amlodipine  5 mg a day, Toprol -XL 25 mg a day, and enalapril  20 mg every morning and 10 mg every afternoon. Electrolytes and creatinine were normal about 3 months ago. Plan repeat in 3 months.  #2 lymphedema.  She has no pitting edema at this point.  She takes one of her 20 mg Lasix  every other day. She limits sodium well. Repeat metabolic panel in 3 months.   3. CAD, asymptomatic. She takes aspirin 81 mg a day. Continue Toprol -XL 25 mg a day. History of statin intolerance and does not want trial of any further cholesterol-lowering medication.  An After Visit Summary was printed and given to the patient.  FOLLOW UP: Return in about 3 months (around 04/03/2024) for routine chronic illness f/u.  Signed:  Gerlene Hockey, MD           01/02/2024

## 2024-01-24 ENCOUNTER — Other Ambulatory Visit: Payer: Self-pay | Admitting: Family Medicine

## 2024-01-27 DIAGNOSIS — H353131 Nonexudative age-related macular degeneration, bilateral, early dry stage: Secondary | ICD-10-CM | POA: Diagnosis not present

## 2024-01-27 DIAGNOSIS — H52203 Unspecified astigmatism, bilateral: Secondary | ICD-10-CM | POA: Diagnosis not present

## 2024-02-07 ENCOUNTER — Other Ambulatory Visit: Payer: Self-pay | Admitting: Family Medicine

## 2024-02-26 ENCOUNTER — Ambulatory Visit: Admitting: Podiatry

## 2024-02-26 ENCOUNTER — Encounter: Payer: Self-pay | Admitting: Podiatry

## 2024-02-26 DIAGNOSIS — L84 Corns and callosities: Secondary | ICD-10-CM

## 2024-02-26 DIAGNOSIS — I739 Peripheral vascular disease, unspecified: Secondary | ICD-10-CM

## 2024-02-26 DIAGNOSIS — G629 Polyneuropathy, unspecified: Secondary | ICD-10-CM | POA: Diagnosis not present

## 2024-02-26 NOTE — Progress Notes (Signed)
 Subjective:  Patient ID: Jennifer Peck, female    DOB: 02-Feb-1923,  MRN: [id number]  Jennifer Peck presents to clinic today for at risk foot care. Patient has h/o PAD, peripheral neuropathy. She is seen for painful corn(s) right foot and painful mycotic toenails that are difficult to trim. Painful toenails interfere with ambulation. Aggravating factors include wearing enclosed shoe gear. Pain is relieved with periodic professional debridement. Painful corns are aggravated when weightbearing when wearing enclosed shoe gear. Pain is relieved with periodic professional debridement.   New problem(s): None.   PCP is McGowen, Aleene DEL, MD. Last office visit 01/02/24.  Allergies  Allergen Reactions   Chlorthalidone      hyponatremia   Atenolol     Caused fatigue    Besivance [Besifloxacin Hcl]     rash   Codeine Nausea And Vomiting   Crestor  [Rosuvastatin ]     leg gave away   Gabapentin     Memory loss and confusion   Lipitor [Atorvastatin ] Other (See Comments)    Severe leg pain   Lovastatin      myalgia   Simvastatin     Her leg gave away on her and states thinks secondary to Simvastatin   Statins Support [Acid Blockers Support]     Other reaction(s): Unknown   Sulfa Antibiotics Nausea Only   Vigamox [Moxifloxacin]     rash   Welchol  [Colesevelam  Hcl]     Review of Systems: Negative except as noted in the HPI.  Objective: No changes noted in today's physical examination. There were no vitals filed for this visit. Jennifer Peck is a pleasant 89 y.o. female WD, WN in NAD. AAO x 3.   Vascular Examination: CFT <3 seconds b/l. DP pulses faintly palpable b/l. Nonpalpable PT pulses b/l. Skin temperature gradient warm to warm b/l. No pain with calf compression. No ischemia or gangrene. No cyanosis or clubbing noted b/l. Evidence of chronic venous insufficiency b/l LE.   Neurological Examination: Protective sensation diminished with 10g monofilament b/l.  Dermatological  Examination: Pedal skin warm and supple b/l.   No open wounds. No interdigital macerations.  Toenails 1-5 b/l thick, discolored, elongated with subungual debris and pain on dorsal palpation.    Hyperkeratotic lesion(s) right 3rd toe.  No erythema, no edema, no drainage, no fluctuance. Porokeratotic lesion(s) submet head 5 right foot. No erythema, no edema, no drainage, no fluctuance.  Musculoskeletal Examination: Hammertoe(s) noted to the 2-4 b/l. HAV with bunion right>left.  Radiographs: None  Assessment/Plan: 1. Corns and callosities   2. Neuropathy   3. PVD (peripheral vascular disease)    -Patient was evaluated today. All questions/concerns addressed on today's visit. -Consent given for treatment as described below: -Patient to continue soft, supportive shoe gear daily. -Toenails 1-5 b/l were debrided in length and girth with sterile nail nippers and dremel without iatrogenic bleeding.  -Corn(s) distal tip of right 3rd toe pared utilizing sterile scalpel blade without complication or incident. Total number debrided=1. -Callus(es) submet head 5 right foot pared utilizing sharp debridement with sterile blade without complication or incident. Total number debrided =1. -Patient/POA to call should there be question/concern in the interim.   Return in about 3 months (around 05/26/2024).  Jennifer Peck, DPM      [facility] LOCATION: [address]  [place], [place] [address]                   Office [phone]   [facility] LOCATION: [address] [place], [place] [address] Office [phone]

## 2024-04-02 ENCOUNTER — Ambulatory Visit: Admitting: Family Medicine

## 2024-04-02 ENCOUNTER — Ambulatory Visit: Payer: Self-pay | Admitting: Family Medicine

## 2024-04-02 ENCOUNTER — Encounter: Payer: Self-pay | Admitting: Family Medicine

## 2024-04-02 VITALS — BP 169/71 | HR 63 | Temp 97.8°F | Ht 60.0 in | Wt 127.8 lb

## 2024-04-02 DIAGNOSIS — I890 Lymphedema, not elsewhere classified: Secondary | ICD-10-CM | POA: Diagnosis not present

## 2024-04-02 DIAGNOSIS — I10 Essential (primary) hypertension: Secondary | ICD-10-CM

## 2024-04-02 DIAGNOSIS — I2510 Atherosclerotic heart disease of native coronary artery without angina pectoris: Secondary | ICD-10-CM

## 2024-04-02 LAB — BASIC METABOLIC PANEL WITH GFR
BUN: 23 mg/dL (ref 6–23)
CO2: 30 meq/L (ref 19–32)
Calcium: 9.6 mg/dL (ref 8.4–10.5)
Chloride: 97 meq/L (ref 96–112)
Creatinine, Ser: 0.75 mg/dL (ref 0.40–1.20)
GFR: 65.81 mL/min
Glucose, Bld: 116 mg/dL — ABNORMAL HIGH (ref 70–99)
Potassium: 5 meq/L (ref 3.5–5.1)
Sodium: 134 meq/L — ABNORMAL LOW (ref 135–145)

## 2024-04-02 MED ORDER — FUROSEMIDE 20 MG PO TABS: 20.0000 mg | ORAL_TABLET | ORAL | 1 refills | Status: AC

## 2024-04-02 MED ORDER — ENALAPRIL MALEATE 20 MG PO TABS: ORAL_TABLET | ORAL | 3 refills | Status: AC

## 2024-04-02 MED ORDER — AMLODIPINE BESYLATE 5 MG PO TABS: 5.0000 mg | ORAL_TABLET | Freq: Every day | ORAL | 3 refills | Status: AC

## 2024-04-02 NOTE — Progress Notes (Signed)
 OFFICE VISIT  04/02/2024  CC: No chief complaint on file.  Patient is a 89 y.o. female who presents accompanied by her daughter for 63-month follow-up hypertension and lymphedema. A/P as of last visit: #1 hypertension, well-controlled on amlodipine  5 mg a day, Toprol -XL 25 mg a day, and enalapril  20 mg every morning and 10 mg every afternoon. Electrolytes and creatinine were normal about 3 months ago. Plan repeat in 3 months.   #2 lymphedema.  She has no pitting edema at this point.  She takes one of her 20 mg Lasix  every other day. She limits sodium well. Repeat metabolic panel in 3 months.   3. CAD, asymptomatic. She takes aspirin 81 mg a day. Continue Toprol -XL 25 mg a day. History of statin intolerance and does not want trial of any further cholesterol-lowering medication.  INTERIM HX: Jennifer Peck feels well. Blood pressure cuff not working very well so she has to either pick up a new battery or new machine.  She is taking her Lasix  every other day.  She takes her aspirin every other day.  Past Medical History:  Diagnosis Date   Allergic state 04/30/2011   Anxiety    BCC (basal cell carcinoma), face 11/06/2010   Bilateral bunions 11/06/2010   Chronic venous insufficiency    lymphedema + venous stasis pigmentation changes   Constipation 11/06/2010   Coronary artery disease 2011   BMS x 3   Diastolic heart failure (HCC)    GERD (gastroesophageal reflux disease) 05/11/2012   History of hyperkalemia 01/23/2011   Hyperlipidemia    Intolerant of statins and zetia .  Refused to consider PCSK9 inhibitor initially but as of 02/2017 cardiol f/u she would consider repatha if approved.   Hypertension    +white coat component   Lesion of labia 02/04/2012   Melanoma in situ [facility]) 08/2016   Derm= Dr. Tricia @ [facility] in [place].   Mitral regurgitation 12/2020   moderate by echo 12/2020   Osteoarthritis of both knees    R>L   Osteopenia 11/23/2010; 02/2016   2017  T-score -1.9.  Repeat DEXA 2 yrs.   Peripheral neuropathy 11/06/2010   Small fiber, non-diabetic per neurologist   Right lumbar radiculopathy 01/2015   Improved with PT   SCC (squamous cell carcinoma), arm 11/06/2010   arm 2012.  R lower leg 10/2020   Seborrheic keratosis 04/30/2011   Vertigo     Past Surgical History:  Procedure Laterality Date   CARDIAC CATHETERIZATION  12/20/2009   3 bare metal stents   DEXA  03/05/2016   T-score -1.9   EYE SURGERY     cataract- left eye 09-24-11   LE arterial dopplers     03/2022 NORMAL   SKIN BIOPSY  08/2012   Shave excision of lesion on pt's back: irritated seb keratosis.   TRANSTHORACIC ECHOCARDIOGRAM  12/05/2009   Mild LVH, normal systolic fxn, mild impaired relaxation.  No signif valvular dz. 12/01/20->EF 60-65%, grd II DD, mod MR, +mild pulm art htn/   TUBAL LIGATION      Outpatient Medications Prior to Visit  Medication Sig Dispense Refill   aspirin EC 81 MG tablet Take 81 mg by mouth daily. Swallow whole.     azelastine  (OPTIVAR ) 0.05 % ophthalmic solution INSTILL TWO DROPS INTO EACH EYE TWICE DAILY 6 mL 12   B Complex Vitamins (B COMPLEX-B12 PO) Take by mouth daily.     fexofenadine  (ALLEGRA ) 60 MG tablet TAKE 1 TABLET BY MOUTH TWICE A DAY FOR ITCHING  180 tablet 1   fluocinonide (LIDEX) 0.05 % external solution Apply 1 application  topically daily as needed.     fluorouracil  (EFUDEX ) 5 % cream APPLY TO AFFECTED AREA EVERY DAY 40 g 0   Fluorouracil  4 % CREA Apply to affected areas once daily 40 g 1   fluticasone  (CUTIVATE ) 0.05 % cream Apply to affected areas of lower legs twice a day 60 g 1   magnesium oxide (MAG-OX) 400 MG tablet Take 400 mg by mouth once a week.     metoprolol  succinate (TOPROL -XL) 25 MG 24 hr tablet Take 1 tablet (25 mg total) by mouth daily. 90 tablet 3   Multiple Vitamin (MULTIVITAMIN) tablet Take 1 tablet by mouth once a week. Takes occ     Naftifine  HCl (NAFTIN ) 2 % GEL Apply to affected area qd-bid 60 g 3    Omega-3 Fatty Acids (FISH OIL PO) Take 1 capsule by mouth once a week.     triamcinolone cream (KENALOG) 0.1 % Apply 1 Application topically 2 (two) times daily.     amLODipine  (NORVASC ) 5 MG tablet TAKE 1 TABLET (5 MG TOTAL) BY MOUTH DAILY. 90 tablet 0   enalapril  (VASOTEC ) 20 MG tablet TAKE 1 TAB BY MOUTH EVERY MORNING AND 1/2 TAB BY MOUTH AT BEDTIME 135 tablet 0   furosemide  (LASIX ) 20 MG tablet Take 1 tablet (20 mg total) by mouth every other day. 45 tablet 1   No facility-administered medications prior to visit.    Allergies[1]  Review of Systems As per HPI  PE:    04/02/2024    1:17 PM 04/02/2024    1:13 PM 01/02/2024    1:21 PM  Vitals with BMI  Height  5' 0 5' 0  Weight 127 lbs 13 oz 127 lbs 13 oz 124 lbs 10 oz  BMI 24.96 24.96 24.33  Systolic 169 169 875  Diastolic 71 71 69  Pulse 63 62 60     Physical Exam  Gen: Alert, well appearing.  Patient is oriented to person, place, time, and situation. AFFECT: pleasant, lucid thought and speech. CV: RRR, occ ectopy, no murmur Lungs are clear, breathing nonlabored. EXT: no pitting edema.  LABS:  Last CBC Lab Results  Component Value Date   WBC 5.7 05/04/2021   HGB 14.2 05/04/2021   HCT 43.3 05/04/2021   MCV 94.3 05/04/2021   MCH 31.8 08/17/2020   RDW 13.5 05/04/2021   PLT 177.0 05/04/2021   Last metabolic panel Lab Results  Component Value Date   GLUCOSE 89 10/01/2023   NA 134 (L) 10/01/2023   K 4.7 10/01/2023   CL 97 10/01/2023   CO2 30 10/01/2023   BUN 19 10/01/2023   CREATININE 0.70 10/01/2023   GFR 71.74 10/01/2023   CALCIUM  10.1 10/01/2023   PHOS 3.5 05/07/2012   PROT 7.6 01/01/2023   ALBUMIN 4.7 01/01/2023   LABGLOB 2.8 09/29/2019   AGRATIO 1.6 09/29/2019   BILITOT 0.8 01/01/2023   ALKPHOS 66 01/01/2023   AST 13 01/01/2023   ALT 15 01/01/2023   ANIONGAP 11 05/02/2015   IMPRESSION AND PLAN:  #1 hypertension, historically well-controlled on amlodipine  5 mg a day, Toprol -XL 25 mg a day,  and enalapril  20 mg every morning and 10 mg every afternoon. BP appear today.  She is going to restart home BP monitoring and call if consistently elevated. Electrolytes and creatinine were normal about 6 months ago. BMET today.   #2 lymphedema.  She has no pitting edema at  this point.  She takes one of her 20 mg Lasix  every other day. She limits sodium well. BMET today.   3. CAD, asymptomatic. She takes aspirin 81 mg every other day (excessive bruising). Continue Toprol -XL 25 mg a day. History of statin intolerance and does not want trial of any further cholesterol-lowering medication.  An After Visit Summary was printed and given to the patient.  FOLLOW UP: Return in about 6 months (around 09/30/2024) for routine chronic illness f/u.  Signed:  Gerlene Hockey, MD           04/02/2024     [1]  Allergies Allergen Reactions   Chlorthalidone      hyponatremia   Atenolol     Caused fatigue    Besivance [Besifloxacin Hcl]     rash   Codeine Nausea And Vomiting   Crestor  [Rosuvastatin ]     leg gave away   Gabapentin     Memory loss and confusion   Lipitor [Atorvastatin ] Other (See Comments)    Severe leg pain   Lovastatin      myalgia   Simvastatin     Her leg gave away on her and states thinks secondary to Simvastatin   Statins Support [Acid Blockers Support]     Other reaction(s): Unknown   Sulfa Antibiotics Nausea Only   Vigamox [Moxifloxacin]     rash   Welchol  [Colesevelam  Hcl]

## 2024-04-08 ENCOUNTER — Other Ambulatory Visit: Payer: Self-pay | Admitting: Family Medicine

## 2024-04-10 ENCOUNTER — Encounter (HOSPITAL_BASED_OUTPATIENT_CLINIC_OR_DEPARTMENT_OTHER): Payer: Self-pay | Admitting: Family

## 2024-04-10 ENCOUNTER — Ambulatory Visit (HOSPITAL_BASED_OUTPATIENT_CLINIC_OR_DEPARTMENT_OTHER): Admitting: Family

## 2024-04-10 VITALS — BP 140/80 | HR 59 | Ht 60.0 in | Wt 130.7 lb

## 2024-04-10 DIAGNOSIS — I10 Essential (primary) hypertension: Secondary | ICD-10-CM | POA: Diagnosis not present

## 2024-04-10 DIAGNOSIS — E785 Hyperlipidemia, unspecified: Secondary | ICD-10-CM

## 2024-04-10 DIAGNOSIS — I25118 Atherosclerotic heart disease of native coronary artery with other forms of angina pectoris: Secondary | ICD-10-CM | POA: Diagnosis not present

## 2024-04-10 NOTE — Progress Notes (Signed)
 " Cardiology Office Note   Date:  04/10/2024  ID:  Makalia, Bare 31-Jul-1922, MRN [id number] PCP: Candise Aleene DEL, MD  [facility] Providers Cardiologist:  Annabella Scarce, MD Cardiology APP:  Vannie Reche GORMAN, NP     History of Present Illness GENEVIE ELMAN is a 89 y.o. female with history of CAD s/p PCI (2011 BMS LCx, RCA, OM1, OM 2), HLD intolerant to statin and Zetia  with myalgia.  Echo 11/2020 LVEF 60 to 65%, grade 2 diastolic dysfunction, mild MR.  She was seen 02/26/2022.  She had episodes of bleeding and aspirin had been discontinued.  Her blood pressure was well-controlled though noted rash and metoprolol  was held.  Enalapril  and amlodipine  were continued.  Due to leg pain LE ABI 03/2022 were unremarkable.  At visit 04/15/23 BP was controlled. She had declined further lipid lowering agents. PCP had added Lasix  every other day for LE edema due to lymphedema with improvement.   Presents today for follow up with her daughter. Two episodes of dyspnea - one after missing her medications and one on a day she was just tired. These episodes are overall infrequent and self resolve. Reports no chest pain, pressure, or tightness. No orthopnea, PND. LE edema well controlled on Lasix . Reports no palpitations.  Her BP was elevated at PCP visit lats week, recently replaced batteries in her home cuff but forgot to check yesterday. Plans to start checking regularly. She is very active completing 60 minute workout at home 3x per week in addition to other daily stretching/strengthening exercises.   ROS: Please see the history of present illness.    All other systems reviewed and are negative.   Studies Reviewed EKG Interpretation Date/Time:  Friday April 10 2024 10:49:54 EST Ventricular Rate:  56 PR Interval:  208 QRS Duration:  72 QT Interval:  432 QTC Calculation: 416 R Axis:   18  Text Interpretation: Sinus bradycardia with sinus arrhythmia  No acute ST/T wave changes  Confirmed by Vannie Reche ([id number]) on 04/10/2024 10:52:47 AM    Cardiac Studies & Procedures   ______________________________________________________________________________________________   STRESS TESTS  NM MYOCAR MULTI W/SPECT W 12/05/2009   ECHOCARDIOGRAM  ECHOCARDIOGRAM COMPLETE 12/01/2020  Narrative ECHOCARDIOGRAM REPORT    Patient Name:   MATILDA FLEIG Zielke Date of Exam: 12/01/2020 Medical Rec #:  [id number]      Height:       60.0 in Accession #:    [id number]     Weight:       133.0 lb Date of Birth:  May 07, 1922      BSA:          1.569 m Patient Age:    89 years       BP:           131/68 mmHg Patient Gender: F              HR:           83 bpm. Exam Location:  Outpatient  Procedure: 2D Echo, Color Doppler and Cardiac Doppler  Indications:    Murmur  History:        Patient has no prior history of Echocardiogram examinations. Risk Factors:Hypertension, Dyslipidemia and Former Smoker. Premature Atrial Contraction.  Sonographer:    Orvil Holmes RDCS Sonographer#2:  Tillman Nora RVT Referring Phys: [id number] TIFFANY Beauregard  IMPRESSIONS   1. Left ventricular ejection fraction, by estimation, is 60 to 65%. The left ventricle has normal function. The left ventricle has no  regional wall motion abnormalities. Left ventricular diastolic parameters are consistent with Grade II diastolic dysfunction (pseudonormalization). 2. Right ventricular systolic function is normal. The right ventricular size is normal. There is mildly elevated pulmonary artery systolic pressure. The estimated right ventricular systolic pressure is 44.0 mmHg. 3. Left atrial size was severely dilated. 4. Right atrial size was severely dilated. 5. The mitral valve is normal in structure. Moderate mitral valve regurgitation. No evidence of mitral stenosis. 6. Tricuspid valve regurgitation is severe. 7. The aortic valve is normal in structure. Aortic valve regurgitation is trivial. Mild aortic valve  sclerosis is present, with no evidence of aortic valve stenosis. 8. The inferior vena cava is normal in size with greater than 50% respiratory variability, suggesting right atrial pressure of 3 mmHg.  FINDINGS Left Ventricle: Left ventricular ejection fraction, by estimation, is 60 to 65%. The left ventricle has normal function. The left ventricle has no regional wall motion abnormalities. The left ventricular internal cavity size was normal in size. There is no left ventricular hypertrophy. Left ventricular diastolic parameters are consistent with Grade II diastolic dysfunction (pseudonormalization).  Right Ventricle: The right ventricular size is normal. No increase in right ventricular wall thickness. Right ventricular systolic function is normal. There is mildly elevated pulmonary artery systolic pressure. The tricuspid regurgitant velocity is 3.20 m/s, and with an assumed right atrial pressure of 3 mmHg, the estimated right ventricular systolic pressure is 44.0 mmHg.  Left Atrium: Left atrial size was severely dilated.  Right Atrium: Right atrial size was severely dilated.  Pericardium: There is no evidence of pericardial effusion.  Mitral Valve: The mitral valve is normal in structure. Moderate mitral valve regurgitation, with posteriorly-directed jet. No evidence of mitral valve stenosis.  Tricuspid Valve: The tricuspid valve is normal in structure. Tricuspid valve regurgitation is severe. No evidence of tricuspid stenosis.  Aortic Valve: The aortic valve is normal in structure. Aortic valve regurgitation is trivial. Aortic regurgitation PHT measures 441 msec. Mild aortic valve sclerosis is present, with no evidence of aortic valve stenosis.  Pulmonic Valve: The pulmonic valve was normal in structure. Pulmonic valve regurgitation is not visualized. No evidence of pulmonic stenosis.  Aorta: The aortic root is normal in size and structure.  Venous: The inferior vena cava is normal in  size with greater than 50% respiratory variability, suggesting right atrial pressure of 3 mmHg.  IAS/Shunts: No atrial level shunt detected by color flow Doppler.   LEFT VENTRICLE PLAX 2D LVIDd:         4.35 cm  Diastology LVIDs:         3.24 cm  LV e' medial:    7.62 cm/s LV PW:         0.85 cm  LV E/e' medial:  13.6 LV IVS:        0.94 cm  LV e' lateral:   8.92 cm/s LVOT diam:     2.00 cm  LV E/e' lateral: 11.7 LVOT Area:     3.14 cm   RIGHT VENTRICLE RV S prime:     21.80 cm/s TAPSE (M-mode): 2.1 cm  LEFT ATRIUM             Index       RIGHT ATRIUM           Index LA diam:        4.30 cm 2.74 cm/m  RA Area:     16.00 cm LA Vol (A2C):   95.4 ml 60.79 ml/m RA Volume:  35.10 ml  22.36 ml/m LA Vol (A4C):   95.5 ml 60.85 ml/m LA Biplane Vol: 98.8 ml 62.95 ml/m AORTIC VALVE          PULMONIC VALVE AI PHT:      441 msec PR End Diast Vel: 5.11 msec   MITRAL VALVE                TRICUSPID VALVE MV Area (PHT): 5.97 cm     TR Peak grad:   41.0 mmHg MV Decel Time: 127 msec     TR Vmax:        320.00 cm/s MR Peak grad: 146.4 mmHg MR Mean grad: 111.0 mmHg    SHUNTS MR Vmax:      605.00 cm/s   Systemic Diam: 2.00 cm MR Vmean:     512.0 cm/s MV E velocity: 104.00 cm/s MV A velocity: 89.40 cm/s MV E/A ratio:  1.16  Oneil Parchment MD Electronically signed by Oneil Parchment MD Signature Date/Time: 12/15/2020/1:42:49 PM    Final          ______________________________________________________________________________________________      Risk Assessment/Calculations          Physical Exam VS:  BP (!) 140/80   Pulse (!) 59   Ht 5' (1.524 m)   Wt 130 lb 11.2 oz (59.3 kg)   SpO2 99%   BMI 25.53 kg/m        Wt Readings from Last 3 Encounters:  04/10/24 130 lb 11.2 oz (59.3 kg)  04/02/24 127 lb 12.8 oz (58 kg)  01/02/24 124 lb 9.6 oz (56.5 kg)    GEN: Well nourished, well developed in no acute distress NECK: No JVD; No carotid bruits CARDIAC: RRR, no murmurs,  rubs, gallops RESPIRATORY:  Clear to auscultation without rales, wheezing or rhonchi  ABDOMEN: Soft, non-tender, non-distended EXTREMITIES:  No edema; No deformity   ASSESSMENT AND PLAN  CAD / HLD, LDL goal <70 - Stable with no anginal symptoms. No indication for ischemic evaluation.  GDMT Metoprolol  succinate 25mg  daily. Prior bruising/bleeding with ASA. Intolerant to statin, zetia  with myalgias. Has previously declined PCSK9i. Encouraged to continue her excellent exercise regimen.  HTN -BP goal <140/80. Mildly elevated today. Discussed to monitor BP at home at least 2 hours after medications and sitting for 5-10 minutes. She will check 3 x per week for the next 2 weeks. Phone call in 2 weeks to check in on BP. If persistently elevated, plan to increase enalapril  to 20mg  BID with BMP in 2 weeks for monitoring.    Lymphedema - on lasix  20mg  every other day per PCP with good control of LE edema.         Dispo: follow up in 1 year  Signed, Reche GORMAN Finder, NP  "

## 2024-04-10 NOTE — Patient Instructions (Addendum)
 Medication Instructions:  Continue your current medications.   *If you need a refill on your cardiac medications before your next appointment, please call your pharmacy*  Testing/Procedures: Your EKG today looked great!  Follow-Up: At [facility], you and your health needs are our priority.  As part of our continuing mission to provide you with exceptional heart care, our providers are all part of one team.  This team includes your primary Cardiologist (physician) and Advanced Practice Providers or APPs (Physician Assistants and Nurse Practitioners) who all work together to provide you with the care you need, when you need it.  Your next appointment:   1 year(s)  Provider:   Annabella Scarce, MD, Rosaline Bane, NP, or Reche Finder, NP    We recommend signing up for the patient portal called MyChart.  Sign up information is provided on this After Visit Summary.  MyChart is used to connect with patients for Virtual Visits (Telemedicine).  Patients are able to view lab/test results, encounter notes, upcoming appointments, etc.  Non-urgent messages can be sent to your provider as well.   To learn more about what you can do with MyChart, go to [email or web address].   Other Instructions  Check your blood pressure three times per week for the next two weeks. We will call you in 2 weeks to check in on your blood pressure. If it consistently more than 140/80 at home we may consider a medication change.

## 2024-04-23 ENCOUNTER — Telehealth (HOSPITAL_BASED_OUTPATIENT_CLINIC_OR_DEPARTMENT_OTHER): Payer: Self-pay | Admitting: *Deleted

## 2024-04-23 NOTE — Telephone Encounter (Signed)
 Returned a call back to the pt and informed her that Reche Finder, NP said her BP's look good and she would like for her to continue her current med regimen.  Pt verbalized understanding and agrees with this plan.

## 2024-04-23 NOTE — Telephone Encounter (Signed)
-----   Message from Nurse Porter HERO sent at 04/10/2024 11:13 AM EST ----- Regarding: CALL PATIENT IN 2 WEEKS (ON 04/23/24 OR 04/24/24) TO CHECK IN ON BP READINGS CALL PT IN 2 WEEKS (ON 2/6) TO CHECK IN ON HER BLOOD PRESSURE READINGS PER CAITLIN WALKER, NP

## 2024-04-23 NOTE — Telephone Encounter (Signed)
 BP looks good - continue current regimen.   Damaria Vachon S Roshawna Colclasure, NP
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
## 2024-06-17 ENCOUNTER — Ambulatory Visit: Admitting: Podiatry
